# Patient Record
Sex: Female | Born: 1940
Health system: Southern US, Community
[De-identification: ages and names within clinical notes are randomized; demographics above are authoritative.]

## PROBLEM LIST (undated history)

## (undated) ENCOUNTER — Emergency Department (HOSPITAL_COMMUNITY): Payer: Medicare HMO

## (undated) DIAGNOSIS — F419 Anxiety disorder, unspecified: Secondary | ICD-10-CM

## (undated) DIAGNOSIS — R0602 Shortness of breath: Secondary | ICD-10-CM

## (undated) DIAGNOSIS — IMO0001 Reserved for inherently not codable concepts without codable children: Secondary | ICD-10-CM

## (undated) DIAGNOSIS — M1990 Unspecified osteoarthritis, unspecified site: Secondary | ICD-10-CM

## (undated) DIAGNOSIS — K219 Gastro-esophageal reflux disease without esophagitis: Secondary | ICD-10-CM

## (undated) DIAGNOSIS — I10 Essential (primary) hypertension: Secondary | ICD-10-CM

## (undated) DIAGNOSIS — R682 Dry mouth, unspecified: Secondary | ICD-10-CM

## (undated) DIAGNOSIS — Z5189 Encounter for other specified aftercare: Secondary | ICD-10-CM

## (undated) DIAGNOSIS — D649 Anemia, unspecified: Secondary | ICD-10-CM

## (undated) DIAGNOSIS — C50919 Malignant neoplasm of unspecified site of unspecified female breast: Secondary | ICD-10-CM

## (undated) DIAGNOSIS — G709 Myoneural disorder, unspecified: Secondary | ICD-10-CM

## (undated) DIAGNOSIS — C801 Malignant (primary) neoplasm, unspecified: Secondary | ICD-10-CM

## (undated) HISTORY — DX: Essential (primary) hypertension: I10

## (undated) HISTORY — PX: BREAST SURGERY: SHX581

## (undated) HISTORY — DX: Anemia, unspecified: D64.9

## (undated) HISTORY — PX: TUBAL LIGATION: SHX77

## (undated) HISTORY — PX: MASTECTOMY: SHX3

## (undated) HISTORY — DX: Malignant neoplasm of unspecified site of unspecified female breast: C50.919

## (undated) HISTORY — PX: ABDOMINAL HYSTERECTOMY: SHX81

## (undated) HISTORY — PX: BREAST LUMPECTOMY: SHX2

## (undated) HISTORY — PX: OTHER SURGICAL HISTORY: SHX169

## (undated) HISTORY — PX: MASTECTOMY W/ SENTINEL NODE BIOPSY: SHX2001

---

## 2010-05-17 ENCOUNTER — Encounter
Admission: RE | Admit: 2010-05-17 | Discharge: 2010-05-17 | Payer: Self-pay | Source: Home / Self Care | Attending: General Practice | Admitting: General Practice

## 2010-05-18 ENCOUNTER — Encounter
Admission: RE | Admit: 2010-05-18 | Discharge: 2010-05-18 | Payer: Self-pay | Source: Home / Self Care | Attending: General Practice | Admitting: General Practice

## 2010-05-18 ENCOUNTER — Other Ambulatory Visit: Payer: Self-pay | Admitting: Diagnostic Radiology

## 2010-05-19 ENCOUNTER — Ambulatory Visit: Payer: Self-pay | Admitting: Oncology

## 2010-05-22 HISTORY — PX: US FINE NEEDLE ASPIRATION WO/ W SMEAR: HXRAD667

## 2010-05-24 ENCOUNTER — Encounter
Admission: RE | Admit: 2010-05-24 | Discharge: 2010-05-24 | Payer: Self-pay | Source: Home / Self Care | Attending: General Practice | Admitting: General Practice

## 2010-05-24 ENCOUNTER — Ambulatory Visit: Payer: Self-pay | Admitting: Genetic Counselor

## 2010-05-24 LAB — CBC WITH DIFFERENTIAL/PLATELET
BASO%: 0.4 % (ref 0.0–2.0)
Basophils Absolute: 0 10*3/uL (ref 0.0–0.1)
EOS%: 2.3 % (ref 0.0–7.0)
Eosinophils Absolute: 0.1 10*3/uL (ref 0.0–0.5)
HCT: 39.9 % (ref 34.8–46.6)
HGB: 13 g/dL (ref 11.6–15.9)
LYMPH%: 24.7 % (ref 14.0–49.7)
MCH: 26.8 pg (ref 25.1–34.0)
MCHC: 32.5 g/dL (ref 31.5–36.0)
MCV: 82.4 fL (ref 79.5–101.0)
MONO#: 0.5 10*3/uL (ref 0.1–0.9)
MONO%: 8.2 % (ref 0.0–14.0)
NEUT#: 3.8 10*3/uL (ref 1.5–6.5)
NEUT%: 64.4 % (ref 38.4–76.8)
Platelets: 261 10*3/uL (ref 145–400)
RBC: 4.84 10*6/uL (ref 3.70–5.45)
RDW: 14.2 % (ref 11.2–14.5)
WBC: 5.8 10*3/uL (ref 3.9–10.3)
lymph#: 1.4 10*3/uL (ref 0.9–3.3)

## 2010-05-24 LAB — COMPREHENSIVE METABOLIC PANEL
ALT: 17 U/L (ref 0–35)
AST: 16 U/L (ref 0–37)
Albumin: 4.6 g/dL (ref 3.5–5.2)
Alkaline Phosphatase: 70 U/L (ref 39–117)
BUN: 19 mg/dL (ref 6–23)
CO2: 27 mEq/L (ref 19–32)
Calcium: 10.1 mg/dL (ref 8.4–10.5)
Chloride: 102 mEq/L (ref 96–112)
Creatinine, Ser: 1.15 mg/dL (ref 0.40–1.20)
Glucose, Bld: 107 mg/dL — ABNORMAL HIGH (ref 70–99)
Potassium: 4.2 mEq/L (ref 3.5–5.3)
Sodium: 139 mEq/L (ref 135–145)
Total Bilirubin: 0.3 mg/dL (ref 0.3–1.2)
Total Protein: 7.7 g/dL (ref 6.0–8.3)

## 2010-05-24 LAB — CANCER ANTIGEN 27.29: CA 27.29: 50 U/mL — ABNORMAL HIGH (ref 0–39)

## 2010-05-26 ENCOUNTER — Ambulatory Visit (HOSPITAL_COMMUNITY)
Admission: RE | Admit: 2010-05-26 | Discharge: 2010-05-26 | Payer: Self-pay | Source: Home / Self Care | Attending: Oncology | Admitting: Oncology

## 2010-06-01 ENCOUNTER — Ambulatory Visit (HOSPITAL_COMMUNITY)
Admission: RE | Admit: 2010-06-01 | Discharge: 2010-06-01 | Payer: Self-pay | Source: Home / Self Care | Attending: Oncology | Admitting: Oncology

## 2010-06-02 ENCOUNTER — Encounter
Admission: RE | Admit: 2010-06-02 | Discharge: 2010-06-02 | Payer: Self-pay | Source: Home / Self Care | Attending: Oncology | Admitting: Oncology

## 2010-06-30 ENCOUNTER — Encounter (HOSPITAL_BASED_OUTPATIENT_CLINIC_OR_DEPARTMENT_OTHER): Payer: Medicare Other | Admitting: Oncology

## 2010-06-30 ENCOUNTER — Other Ambulatory Visit: Payer: Self-pay | Admitting: Oncology

## 2010-06-30 DIAGNOSIS — C50919 Malignant neoplasm of unspecified site of unspecified female breast: Secondary | ICD-10-CM

## 2010-06-30 LAB — CBC WITH DIFFERENTIAL/PLATELET
Basophils Absolute: 0 10*3/uL (ref 0.0–0.1)
EOS%: 3.3 % (ref 0.0–7.0)
Eosinophils Absolute: 0.2 10*3/uL (ref 0.0–0.5)
HCT: 39.7 % (ref 34.8–46.6)
HGB: 12.6 g/dL (ref 11.6–15.9)
MCH: 25.9 pg (ref 25.1–34.0)
MCV: 81.7 fL (ref 79.5–101.0)
MONO%: 9.2 % (ref 0.0–14.0)
NEUT%: 46.5 % (ref 38.4–76.8)
lymph#: 2.8 10*3/uL (ref 0.9–3.3)

## 2010-06-30 LAB — COMPREHENSIVE METABOLIC PANEL
Albumin: 4.3 g/dL (ref 3.5–5.2)
Alkaline Phosphatase: 71 U/L (ref 39–117)
BUN: 21 mg/dL (ref 6–23)
CO2: 29 mEq/L (ref 19–32)
Glucose, Bld: 115 mg/dL — ABNORMAL HIGH (ref 70–99)
Potassium: 4.2 mEq/L (ref 3.5–5.3)

## 2010-06-30 LAB — VITAMIN D 25 HYDROXY (VIT D DEFICIENCY, FRACTURES): Vit D, 25-Hydroxy: 29 ng/mL — ABNORMAL LOW (ref 30–89)

## 2010-07-07 ENCOUNTER — Encounter (HOSPITAL_BASED_OUTPATIENT_CLINIC_OR_DEPARTMENT_OTHER): Payer: Medicare Other | Admitting: Oncology

## 2010-07-07 DIAGNOSIS — C50919 Malignant neoplasm of unspecified site of unspecified female breast: Secondary | ICD-10-CM

## 2010-07-07 DIAGNOSIS — Z170 Estrogen receptor positive status [ER+]: Secondary | ICD-10-CM

## 2010-08-11 ENCOUNTER — Other Ambulatory Visit: Payer: Self-pay | Admitting: Oncology

## 2010-08-11 ENCOUNTER — Encounter (HOSPITAL_BASED_OUTPATIENT_CLINIC_OR_DEPARTMENT_OTHER): Payer: Medicare Other | Admitting: Oncology

## 2010-08-11 DIAGNOSIS — C50919 Malignant neoplasm of unspecified site of unspecified female breast: Secondary | ICD-10-CM

## 2010-08-11 LAB — CBC WITH DIFFERENTIAL/PLATELET
BASO%: 0.2 % (ref 0.0–2.0)
Basophils Absolute: 0 10e3/uL (ref 0.0–0.1)
EOS%: 2.3 % (ref 0.0–7.0)
Eosinophils Absolute: 0.1 10e3/uL (ref 0.0–0.5)
HCT: 37.1 % (ref 34.8–46.6)
HGB: 11.9 g/dL (ref 11.6–15.9)
LYMPH%: 31.9 % (ref 14.0–49.7)
MCH: 26.3 pg (ref 25.1–34.0)
MCHC: 32.1 g/dL (ref 31.5–36.0)
MCV: 81.9 fL (ref 79.5–101.0)
MONO#: 0.7 10e3/uL (ref 0.1–0.9)
MONO%: 12.8 % (ref 0.0–14.0)
NEUT#: 3 10e3/uL (ref 1.5–6.5)
NEUT%: 52.8 % (ref 38.4–76.8)
Platelets: 230 10e3/uL (ref 145–400)
RBC: 4.53 10e6/uL (ref 3.70–5.45)
RDW: 13.9 % (ref 11.2–14.5)
WBC: 5.6 10e3/uL (ref 3.9–10.3)
lymph#: 1.8 10e3/uL (ref 0.9–3.3)
nRBC: 0 % (ref 0–0)

## 2010-08-11 LAB — COMPREHENSIVE METABOLIC PANEL
ALT: 15 U/L (ref 0–35)
AST: 16 U/L (ref 0–37)
Albumin: 4.2 g/dL (ref 3.5–5.2)
CO2: 27 mEq/L (ref 19–32)
Calcium: 9.7 mg/dL (ref 8.4–10.5)
Chloride: 106 mEq/L (ref 96–112)
Potassium: 3.9 mEq/L (ref 3.5–5.3)
Sodium: 142 mEq/L (ref 135–145)
Total Protein: 7.1 g/dL (ref 6.0–8.3)

## 2010-08-11 LAB — VITAMIN D 25 HYDROXY (VIT D DEFICIENCY, FRACTURES): Vit D, 25-Hydroxy: 37 ng/mL (ref 30–89)

## 2010-08-18 ENCOUNTER — Encounter (HOSPITAL_BASED_OUTPATIENT_CLINIC_OR_DEPARTMENT_OTHER): Payer: Medicare Other | Admitting: Oncology

## 2010-08-18 DIAGNOSIS — C50919 Malignant neoplasm of unspecified site of unspecified female breast: Secondary | ICD-10-CM

## 2010-08-18 DIAGNOSIS — Z170 Estrogen receptor positive status [ER+]: Secondary | ICD-10-CM

## 2010-09-12 ENCOUNTER — Encounter (INDEPENDENT_AMBULATORY_CARE_PROVIDER_SITE_OTHER): Payer: Self-pay | Admitting: General Surgery

## 2010-09-15 ENCOUNTER — Other Ambulatory Visit: Payer: Self-pay | Admitting: Physician Assistant

## 2010-09-15 ENCOUNTER — Encounter (HOSPITAL_BASED_OUTPATIENT_CLINIC_OR_DEPARTMENT_OTHER): Payer: Medicare Other | Admitting: Oncology

## 2010-09-15 DIAGNOSIS — C50919 Malignant neoplasm of unspecified site of unspecified female breast: Secondary | ICD-10-CM

## 2010-09-15 DIAGNOSIS — Z170 Estrogen receptor positive status [ER+]: Secondary | ICD-10-CM

## 2010-09-15 LAB — COMPREHENSIVE METABOLIC PANEL
ALT: 18 U/L (ref 0–35)
AST: 16 U/L (ref 0–37)
Alkaline Phosphatase: 67 U/L (ref 39–117)
BUN: 20 mg/dL (ref 6–23)
Calcium: 9.4 mg/dL (ref 8.4–10.5)
Chloride: 104 mEq/L (ref 96–112)
Creatinine, Ser: 1.06 mg/dL (ref 0.40–1.20)
Potassium: 4.4 mEq/L (ref 3.5–5.3)

## 2010-09-15 LAB — CBC WITH DIFFERENTIAL/PLATELET
BASO%: 0.3 % (ref 0.0–2.0)
Basophils Absolute: 0 10*3/uL (ref 0.0–0.1)
EOS%: 3.1 % (ref 0.0–7.0)
MCH: 25.8 pg (ref 25.1–34.0)
MCHC: 31.6 g/dL (ref 31.5–36.0)
MCV: 81.8 fL (ref 79.5–101.0)
MONO%: 9.7 % (ref 0.0–14.0)
NEUT%: 51.3 % (ref 38.4–76.8)
RDW: 13.9 % (ref 11.2–14.5)
lymph#: 2.2 10*3/uL (ref 0.9–3.3)

## 2010-10-20 ENCOUNTER — Other Ambulatory Visit: Payer: Self-pay | Admitting: Physician Assistant

## 2010-10-20 ENCOUNTER — Other Ambulatory Visit: Payer: Self-pay | Admitting: Oncology

## 2010-10-20 ENCOUNTER — Encounter (HOSPITAL_BASED_OUTPATIENT_CLINIC_OR_DEPARTMENT_OTHER): Payer: Medicare Other | Admitting: Oncology

## 2010-10-20 DIAGNOSIS — C50919 Malignant neoplasm of unspecified site of unspecified female breast: Secondary | ICD-10-CM

## 2010-10-20 DIAGNOSIS — Z853 Personal history of malignant neoplasm of breast: Secondary | ICD-10-CM

## 2010-10-20 DIAGNOSIS — Z170 Estrogen receptor positive status [ER+]: Secondary | ICD-10-CM

## 2010-10-20 DIAGNOSIS — C50419 Malignant neoplasm of upper-outer quadrant of unspecified female breast: Secondary | ICD-10-CM

## 2010-10-20 LAB — CBC WITH DIFFERENTIAL/PLATELET
Basophils Absolute: 0 10*3/uL (ref 0.0–0.1)
Eosinophils Absolute: 0.2 10*3/uL (ref 0.0–0.5)
HCT: 35.9 % (ref 34.8–46.6)
LYMPH%: 27.2 % (ref 14.0–49.7)
MCV: 81.8 fL (ref 79.5–101.0)
MONO#: 0.7 10*3/uL (ref 0.1–0.9)
MONO%: 13.3 % (ref 0.0–14.0)
NEUT#: 3.1 10*3/uL (ref 1.5–6.5)
NEUT%: 56.2 % (ref 38.4–76.8)
Platelets: 219 10*3/uL (ref 145–400)
WBC: 5.6 10*3/uL (ref 3.9–10.3)

## 2010-10-20 LAB — COMPREHENSIVE METABOLIC PANEL
Alkaline Phosphatase: 69 U/L (ref 39–117)
BUN: 19 mg/dL (ref 6–23)
CO2: 27 mEq/L (ref 19–32)
Creatinine, Ser: 1.13 mg/dL — ABNORMAL HIGH (ref 0.50–1.10)
Glucose, Bld: 106 mg/dL — ABNORMAL HIGH (ref 70–99)
Total Bilirubin: 0.3 mg/dL (ref 0.3–1.2)
Total Protein: 7.3 g/dL (ref 6.0–8.3)

## 2010-10-20 LAB — CANCER ANTIGEN 27.29: CA 27.29: 24 U/mL (ref 0–39)

## 2010-10-20 LAB — LACTATE DEHYDROGENASE: LDH: 219 U/L (ref 94–250)

## 2010-10-27 ENCOUNTER — Ambulatory Visit
Admission: RE | Admit: 2010-10-27 | Discharge: 2010-10-27 | Disposition: A | Discharge status: Home / Self Care | Payer: Medicare Other | Source: Ambulatory Visit | Attending: Oncology | Admitting: Oncology

## 2010-10-27 DIAGNOSIS — Z853 Personal history of malignant neoplasm of breast: Secondary | ICD-10-CM

## 2010-11-24 ENCOUNTER — Other Ambulatory Visit: Payer: Self-pay | Admitting: Physician Assistant

## 2010-11-24 ENCOUNTER — Encounter (HOSPITAL_BASED_OUTPATIENT_CLINIC_OR_DEPARTMENT_OTHER): Payer: Medicare Other | Admitting: Oncology

## 2010-11-24 DIAGNOSIS — Z170 Estrogen receptor positive status [ER+]: Secondary | ICD-10-CM

## 2010-11-24 DIAGNOSIS — C50919 Malignant neoplasm of unspecified site of unspecified female breast: Secondary | ICD-10-CM

## 2010-11-24 LAB — COMPREHENSIVE METABOLIC PANEL
AST: 22 U/L (ref 0–37)
Albumin: 4.1 g/dL (ref 3.5–5.2)
BUN: 17 mg/dL (ref 6–23)
Calcium: 9.5 mg/dL (ref 8.4–10.5)
Chloride: 105 mEq/L (ref 96–112)
Glucose, Bld: 78 mg/dL (ref 70–99)
Potassium: 3.8 mEq/L (ref 3.5–5.3)
Sodium: 139 mEq/L (ref 135–145)
Total Protein: 7.2 g/dL (ref 6.0–8.3)

## 2010-11-24 LAB — CBC WITH DIFFERENTIAL/PLATELET
BASO%: 0.3 % (ref 0.0–2.0)
Basophils Absolute: 0 10*3/uL (ref 0.0–0.1)
EOS%: 2.3 % (ref 0.0–7.0)
HGB: 11.9 g/dL (ref 11.6–15.9)
MCH: 26.2 pg (ref 25.1–34.0)
MONO#: 0.6 10*3/uL (ref 0.1–0.9)
NEUT#: 3.5 10*3/uL (ref 1.5–6.5)
RDW: 13.8 % (ref 11.2–14.5)
WBC: 6.5 10*3/uL (ref 3.9–10.3)
lymph#: 2.3 10*3/uL (ref 0.9–3.3)

## 2010-11-24 LAB — CANCER ANTIGEN 27.29: CA 27.29: 25 U/mL (ref 0–39)

## 2010-12-22 ENCOUNTER — Encounter (HOSPITAL_BASED_OUTPATIENT_CLINIC_OR_DEPARTMENT_OTHER): Payer: Medicare Other | Admitting: Oncology

## 2010-12-22 ENCOUNTER — Other Ambulatory Visit: Payer: Self-pay | Admitting: Physician Assistant

## 2010-12-22 DIAGNOSIS — C50419 Malignant neoplasm of upper-outer quadrant of unspecified female breast: Secondary | ICD-10-CM

## 2010-12-22 DIAGNOSIS — Z170 Estrogen receptor positive status [ER+]: Secondary | ICD-10-CM

## 2010-12-22 DIAGNOSIS — C50919 Malignant neoplasm of unspecified site of unspecified female breast: Secondary | ICD-10-CM

## 2010-12-22 LAB — CBC WITH DIFFERENTIAL/PLATELET
BASO%: 0.5 % (ref 0.0–2.0)
EOS%: 2.9 % (ref 0.0–7.0)
LYMPH%: 34.3 % (ref 14.0–49.7)
MCH: 25.9 pg (ref 25.1–34.0)
MCHC: 31.9 g/dL (ref 31.5–36.0)
MONO#: 0.8 10*3/uL (ref 0.1–0.9)
NEUT%: 49.9 % (ref 38.4–76.8)
Platelets: 236 10*3/uL (ref 145–400)
RBC: 4.64 10*6/uL (ref 3.70–5.45)
WBC: 6.2 10*3/uL (ref 3.9–10.3)
nRBC: 0 % (ref 0–0)

## 2010-12-22 LAB — COMPREHENSIVE METABOLIC PANEL
ALT: 19 U/L (ref 0–35)
AST: 16 U/L (ref 0–37)
Alkaline Phosphatase: 71 U/L (ref 39–117)
BUN: 16 mg/dL (ref 6–23)
Calcium: 9.3 mg/dL (ref 8.4–10.5)
Chloride: 103 mEq/L (ref 96–112)
Creatinine, Ser: 1.07 mg/dL (ref 0.50–1.10)
Total Bilirubin: 0.3 mg/dL (ref 0.3–1.2)

## 2011-01-26 ENCOUNTER — Other Ambulatory Visit: Payer: Self-pay | Admitting: Physician Assistant

## 2011-01-26 ENCOUNTER — Encounter (HOSPITAL_BASED_OUTPATIENT_CLINIC_OR_DEPARTMENT_OTHER): Payer: Medicare Other | Admitting: Oncology

## 2011-01-26 DIAGNOSIS — Z170 Estrogen receptor positive status [ER+]: Secondary | ICD-10-CM

## 2011-01-26 DIAGNOSIS — C50419 Malignant neoplasm of upper-outer quadrant of unspecified female breast: Secondary | ICD-10-CM

## 2011-01-26 DIAGNOSIS — C50919 Malignant neoplasm of unspecified site of unspecified female breast: Secondary | ICD-10-CM

## 2011-01-26 DIAGNOSIS — R300 Dysuria: Secondary | ICD-10-CM

## 2011-01-26 LAB — CBC WITH DIFFERENTIAL/PLATELET
BASO%: 0.7 % (ref 0.0–2.0)
Basophils Absolute: 0 10*3/uL (ref 0.0–0.1)
EOS%: 3.2 % (ref 0.0–7.0)
HCT: 37.1 % (ref 34.8–46.6)
HGB: 12.3 g/dL (ref 11.6–15.9)
LYMPH%: 26.6 % (ref 14.0–49.7)
MCH: 26.8 pg (ref 25.1–34.0)
MCHC: 33.1 g/dL (ref 31.5–36.0)
MCV: 80.7 fL (ref 79.5–101.0)
MONO%: 10.5 % (ref 0.0–14.0)
NEUT%: 59 % (ref 38.4–76.8)
Platelets: 223 10*3/uL (ref 145–400)

## 2011-01-26 LAB — URINALYSIS, MICROSCOPIC - CHCC
Ketones: NEGATIVE mg/dL
Nitrite: NEGATIVE
Protein: NEGATIVE mg/dL
pH: 6 (ref 4.6–8.0)

## 2011-01-26 LAB — COMPREHENSIVE METABOLIC PANEL
Albumin: 4.2 g/dL (ref 3.5–5.2)
Alkaline Phosphatase: 76 U/L (ref 39–117)
BUN: 20 mg/dL (ref 6–23)
CO2: 27 mEq/L (ref 19–32)
Glucose, Bld: 91 mg/dL (ref 70–99)
Sodium: 140 mEq/L (ref 135–145)
Total Bilirubin: 0.3 mg/dL (ref 0.3–1.2)
Total Protein: 7.3 g/dL (ref 6.0–8.3)

## 2011-02-20 ENCOUNTER — Other Ambulatory Visit: Payer: Self-pay | Admitting: Oncology

## 2011-02-20 DIAGNOSIS — C50911 Malignant neoplasm of unspecified site of right female breast: Secondary | ICD-10-CM

## 2011-02-23 ENCOUNTER — Other Ambulatory Visit: Payer: Self-pay | Admitting: Physician Assistant

## 2011-02-23 ENCOUNTER — Encounter (HOSPITAL_BASED_OUTPATIENT_CLINIC_OR_DEPARTMENT_OTHER): Payer: Medicare Other | Admitting: Oncology

## 2011-02-23 ENCOUNTER — Encounter: Payer: Medicare Other | Admitting: Hematology and Oncology

## 2011-02-23 DIAGNOSIS — Z170 Estrogen receptor positive status [ER+]: Secondary | ICD-10-CM

## 2011-02-23 DIAGNOSIS — R300 Dysuria: Secondary | ICD-10-CM

## 2011-02-23 DIAGNOSIS — C50919 Malignant neoplasm of unspecified site of unspecified female breast: Secondary | ICD-10-CM

## 2011-02-23 DIAGNOSIS — C50419 Malignant neoplasm of upper-outer quadrant of unspecified female breast: Secondary | ICD-10-CM

## 2011-02-23 LAB — CBC WITH DIFFERENTIAL/PLATELET
Basophils Absolute: 0 10*3/uL (ref 0.0–0.1)
Eosinophils Absolute: 0.2 10*3/uL (ref 0.0–0.5)
HGB: 11.9 g/dL (ref 11.6–15.9)
LYMPH%: 25.6 % (ref 14.0–49.7)
MCV: 80.9 fL (ref 79.5–101.0)
MONO%: 12.6 % (ref 0.0–14.0)
NEUT#: 3.9 10*3/uL (ref 1.5–6.5)
Platelets: 234 10*3/uL (ref 145–400)
RDW: 14.7 % — ABNORMAL HIGH (ref 11.2–14.5)

## 2011-02-23 LAB — COMPREHENSIVE METABOLIC PANEL
ALT: 19 U/L (ref 0–35)
Albumin: 4.2 g/dL (ref 3.5–5.2)
CO2: 23 mEq/L (ref 19–32)
Calcium: 9.7 mg/dL (ref 8.4–10.5)
Chloride: 106 mEq/L (ref 96–112)
Creatinine, Ser: 1.05 mg/dL (ref 0.50–1.10)
Potassium: 4.1 mEq/L (ref 3.5–5.3)
Total Protein: 7.4 g/dL (ref 6.0–8.3)

## 2011-02-23 LAB — URINALYSIS, MICROSCOPIC - CHCC
Bilirubin (Urine): NEGATIVE
Blood: NEGATIVE
pH: 5 (ref 4.6–8.0)

## 2011-02-25 LAB — URINE CULTURE

## 2011-03-09 ENCOUNTER — Ambulatory Visit
Admission: RE | Admit: 2011-03-09 | Discharge: 2011-03-09 | Disposition: A | Discharge status: Home / Self Care | Payer: Medicare Other | Source: Ambulatory Visit | Attending: Oncology | Admitting: Oncology

## 2011-03-09 DIAGNOSIS — C50911 Malignant neoplasm of unspecified site of right female breast: Secondary | ICD-10-CM

## 2011-03-09 MED ORDER — GADOBENATE DIMEGLUMINE 529 MG/ML IV SOLN
20.0000 mL | Freq: Once | INTRAVENOUS | Status: AC | PRN
  Administered 2011-03-09: 20 mL via INTRAVENOUS

## 2011-03-23 ENCOUNTER — Ambulatory Visit (HOSPITAL_BASED_OUTPATIENT_CLINIC_OR_DEPARTMENT_OTHER): Payer: Medicare Other | Admitting: Oncology

## 2011-03-23 ENCOUNTER — Other Ambulatory Visit: Payer: Self-pay

## 2011-03-23 ENCOUNTER — Other Ambulatory Visit (HOSPITAL_BASED_OUTPATIENT_CLINIC_OR_DEPARTMENT_OTHER): Payer: Medicare Other | Admitting: Lab

## 2011-03-23 VITALS — BP 147/83 | HR 96 | Temp 97.8°F | Ht 66.5 in | Wt 258.8 lb

## 2011-03-23 DIAGNOSIS — C50919 Malignant neoplasm of unspecified site of unspecified female breast: Secondary | ICD-10-CM

## 2011-03-23 DIAGNOSIS — Z170 Estrogen receptor positive status [ER+]: Secondary | ICD-10-CM

## 2011-03-23 LAB — CBC WITH DIFFERENTIAL/PLATELET
EOS%: 2.9 % (ref 0.0–7.0)
Eosinophils Absolute: 0.2 10*3/uL (ref 0.0–0.5)
LYMPH%: 32.6 % (ref 14.0–49.7)
MCH: 26.5 pg (ref 25.1–34.0)
MCV: 81.4 fL (ref 79.5–101.0)
MONO%: 10.1 % (ref 0.0–14.0)
NEUT#: 3.2 10*3/uL (ref 1.5–6.5)
Platelets: 248 10*3/uL (ref 145–400)
RBC: 4.74 10*6/uL (ref 3.70–5.45)

## 2011-03-23 LAB — COMPREHENSIVE METABOLIC PANEL
Alkaline Phosphatase: 88 U/L (ref 39–117)
BUN: 17 mg/dL (ref 6–23)
Glucose, Bld: 102 mg/dL — ABNORMAL HIGH (ref 70–99)
Sodium: 140 mEq/L (ref 135–145)
Total Bilirubin: 0.3 mg/dL (ref 0.3–1.2)
Total Protein: 7.4 g/dL (ref 6.0–8.3)

## 2011-03-23 NOTE — Progress Notes (Signed)
Hematology and Oncology Follow Up Visit  Diane Paul [id number] [birth year] 70 y.o. 03/23/2011 10:45 AM   Principle Diagnosis: Locally advanced er/pr+ breast cancer on neoadjuavant femara therapy since 1/12.  Interim History:  Doing well without complain  Medications: I have reviewed the patient's current medications.  Allergies: No Known Allergies  Past Medical History, Surgical history, Social history, and Family History were reviewed and updated.  Review of Systems: Constitutional:  Negative for fever, chills, night sweats, anorexia, weight loss, pain. Cardiovascular: no chest pain or dyspnea on exertion Respiratory: no cough, shortness of breath, or wheezing Neurological: no TIA or stroke symptoms Dermatological: negative ENT: negative Skin Gastrointestinal: no abdominal pain, change in bowel habits, or black or bloody stools Genito-Urinary: no dysuria, trouble voiding, or hematuria Hematological and Lymphatic: negative Breast: negative for breast lumps Musculoskeletal: negative Remaining ROS negative.  Physical Exam: Blood pressure 147/83, pulse 96, temperature 97.8 F (36.6 C), temperature source Oral, height 5' 6.5" (1.689 m), weight 258 lb 12.8 oz (117.391 kg). ECOG:  General appearance: alert, cooperative and appears stated age Head: Normocephalic, without obvious abnormality, atraumatic Neck: no adenopathy, no carotid bruit, no JVD, supple, symmetrical, trachea midline and thyroid not enlarged, symmetric, no tenderness/mass/nodules Lymph nodes: Cervical, supraclavicular, and axillary nodes normal. Cardiac : Normal Pulmonary: Normal  Breasts: Right - no palpable masses , left breast-normal Abdomen: Protuberant, no masses Extremities normal left leg edema-chronic Neuro: Normal  Lab Results: Lab Results  Component Value Date   WBC 5.9 03/23/2011   HGB 12.5 03/23/2011   HCT 38.5 03/23/2011   MCV 81.4 03/23/2011   PLT 248 03/23/2011     Chemistry        Component Value Date/Time   NA 141 02/23/2011 0932   K 4.1 02/23/2011 0932   CL 106 02/23/2011 0932   CO2 23 02/23/2011 0932   BUN 20 02/23/2011 0932   CREATININE 1.05 02/23/2011 0932      Component Value Date/Time   CALCIUM 9.7 02/23/2011 0932   ALKPHOS 80 02/23/2011 0932   AST 17 02/23/2011 0932   ALT 19 02/23/2011 0932   BILITOT 0.2* 02/23/2011 0932       Radiological Studies: chest X-ray n/a Mammogram n/a Bone density n/a  Impression and Plan: 70 year old woman history of ER positive lobular cancer status post neoadjuvant Femara therapy since January 2012, recent MRI scan November 2012 mass in breast measuring 4.2 x 1.7 x 2 cm, compared to 5.4 x 4.6 x 4.4 cm in January 2012 she sees Dr. Derrell Lolling in December and so I would recommend she undergo surgery sometime in January. I will plan to see her again in followup in 3 months. I would also recommend she have radiation oncology followup.   More than 50% of the visit was spent in patient-related counselling   [name], MD 11/16/201210:45 AM

## 2011-04-19 ENCOUNTER — Encounter (INDEPENDENT_AMBULATORY_CARE_PROVIDER_SITE_OTHER): Payer: Self-pay | Admitting: General Surgery

## 2011-04-19 ENCOUNTER — Ambulatory Visit (INDEPENDENT_AMBULATORY_CARE_PROVIDER_SITE_OTHER): Payer: Medicare Other | Admitting: General Surgery

## 2011-04-19 VITALS — BP 140/84 | HR 80 | Temp 98.6°F | Resp 12 | Ht 68.0 in | Wt 258.8 lb

## 2011-04-19 DIAGNOSIS — C50911 Malignant neoplasm of unspecified site of right female breast: Secondary | ICD-10-CM

## 2011-04-19 DIAGNOSIS — C50919 Malignant neoplasm of unspecified site of unspecified female breast: Secondary | ICD-10-CM

## 2011-04-19 NOTE — Patient Instructions (Signed)
You have had a good response to the Femara.  It is now time to plan your surgery to treat the cancer in your right breast. I have discussed lumpectomy and sentinel lobe biopsy. I have discussed mastectomy and sentinel node biopsy. You are going to go home and think about the 2 operations and call me back no later than next Tuesday to tell me which her decision is. We are also going to give you an appointment to see me in one month.

## 2011-04-19 NOTE — Progress Notes (Signed)
Patient ID: Diane Paul, female   DOB: 25-Mar-1941, 70 y.o.   MRN: [id number]  Chief Complaint  Patient presents with  . Follow-up    BREAST CK    HPI Diane Paul is a 70 y.o. female.  She returns to see me after 10 months on neoadjuvant Femara.  This 70 year old African American woman was diagnosed with invasive lobular carcinoma of the right breast on May 24, 2010. This was a 4 cm tumor ER+, Her-2 neg.. It was palpable. MRI suggested that it was a 5.4 x 4.4 cm tumor and so was a T3 by MRI.  Dr. Donnie Coffin put her on neoadjuvant Femara. She has slowly downstaged.  I last saw her in June of 2012. She was lost to followup by me at that time.  Followup MRI was performed on March 09, 2011. This shows decreased enhancement and decrease the mass effect of the tumor, but the  dimensions are still 4.2 x 2.0 x 1.7 cm.  Slightly enhancing right axillary lymph nodes were noted. Left breast and left axilla normal.  She saw Dr. Donnie Coffin in March 23, 2011. He referred her to me for definitive surgery.  She is here with her daughter today. She says the breast feels better than it did a year ago. We spent a long time talking about lumpectomy and sentinel node biopsy versus mastectomy. She is somewhat ambivalent. She is more concerned about recurrence in the breast that she is about cosmesis. Her sister had breast cancer and had a difficult time with that. She is unable to make a decision about extent of  surgery today. we did discuss that for about 30 minutes. HPI  Past Medical History  Diagnosis Date  . Hypertension   . Anemia     Past Surgical History  Procedure Date  . Abdominal hysterectomy   . Tubal ligation     No family history on file.  Social History History  Substance Use Topics  . Smoking status: Never Smoker   . Smokeless tobacco: Not on file  . Alcohol Use: No    No Known Allergies  Current Outpatient Prescriptions  Medication Sig Dispense Refill  . amLODipine  (NORVASC) 5 MG tablet Take 5 mg by mouth daily.        . Calcium Carbonate-Vit D-Min (CALTRATE PLUS PO) Take by mouth. 1200MG  OF CALCIUM & 800IU OF VITAMIN D       . letrozole (FEMARA) 2.5 MG tablet Take 2.5 mg by mouth daily.        Marland Kitchen VITAMIN D, CHOLECALCIFEROL, PO Take by mouth.          Review of Systems Review of Systems  Constitutional: Negative for fever, chills and unexpected weight change.  HENT: Negative for hearing loss, congestion, sore throat, trouble swallowing and voice change.   Eyes: Negative for visual disturbance.  Respiratory: Negative for cough and wheezing.   Cardiovascular: Negative for chest pain, palpitations and leg swelling.  Gastrointestinal: Negative for nausea, vomiting, abdominal pain, diarrhea, constipation, blood in stool, abdominal distention and anal bleeding.  Genitourinary: Negative for hematuria, vaginal bleeding and difficulty urinating.  Musculoskeletal: Negative for arthralgias.  Skin: Negative for rash and wound.  Neurological: Negative for seizures, syncope and headaches.  Hematological: Negative for adenopathy. Does not bruise/bleed easily.  Psychiatric/Behavioral: Negative for confusion.    Blood pressure 140/84, pulse 80, temperature 98.6 F (37 C), resp. rate 12, height 5\' 8"  (1.727 m), weight 258 lb 12.8 oz (117.391 kg).  Physical Exam Physical Exam  Constitutional: She is oriented to person, place, and time. She appears well-developed and well-nourished. No distress.  HENT:  Head: Normocephalic and atraumatic.  Nose: Nose normal.  Mouth/Throat: No oropharyngeal exudate.  Eyes: Conjunctivae and EOM are normal. Pupils are equal, round, and reactive to light. Left eye exhibits no discharge. No scleral icterus.  Neck: Neck supple. No JVD present. No tracheal deviation present. No thyromegaly present.  Cardiovascular: Normal rate, regular rhythm, normal heart sounds and intact distal pulses.   No murmur heard. Pulmonary/Chest: Effort  normal and breath sounds normal. No respiratory distress. She has no wheezes. She has no rales. She exhibits no tenderness.    Abdominal: Soft. Bowel sounds are normal. She exhibits no distension and no mass. There is no tenderness. There is no rebound and no guarding.  Musculoskeletal: She exhibits no edema and no tenderness.  Lymphadenopathy:    She has no cervical adenopathy.  Neurological: She is alert and oriented to person, place, and time. She exhibits normal muscle tone. Coordination normal.  Skin: Skin is warm. No rash noted. She is not diaphoretic. No erythema. No pallor.  Psychiatric: She has a normal mood and affect. Her behavior is normal. Judgment and thought content normal.    Data Reviewed  I have reviewed her recent MRI, Dr. Renelda Loma notes. Assessment    Invasive lobular carcinoma right breast, 9:00 position, pretreatment stage by MRI was T3 N0 lesion. Receptor positive, HER-2-negative.  This has responded reasonably well to neoadjuvant Femara.  Much better response by physical exam than by MRI. It is not clear to me what the extent of disease is in her right breast since the physical exam and MRI do not correlate.  Obesity  Hypertension  Family history of breast cancer in her sister.    Plan    Lengthy discussion about lumpectomy, mastectomy, sentinel node biopsy. We talked about the pros and cons of the different surgeries. She was offered referral to a plastic surgeon if she chooses mastectomy. She is aware that sometimes second surgery is required following lumpectomy for positive margins, extend of  disease, multiple lymph nodes with cancer. She is aware that there is a possibility of local recurrence even with mastectomy.  After about 30 minute discussion she was still somewhat uncertain and wanted to go home and think about this. She will call me back in 2-4 days with her decision.  We will plan to proceed with definitive surgery in January, regardless, as  there does not appear to be any advantage for further neoadjuvant treatment.       Dasani Thurlow M 04/19/2011, 12:17 PM

## 2011-04-24 ENCOUNTER — Telehealth (INDEPENDENT_AMBULATORY_CARE_PROVIDER_SITE_OTHER): Payer: Self-pay

## 2011-04-24 NOTE — Telephone Encounter (Signed)
Pts son called to discuss benefit of mastectomy vs lumpectomy.I also advised him that the percentages for her cure would not be available until the pathology was complete and Dr Donnie Coffin evaluated this information.  I also a He will review this with his mother and have her call back with her decision.

## 2011-05-24 ENCOUNTER — Encounter (INDEPENDENT_AMBULATORY_CARE_PROVIDER_SITE_OTHER): Payer: Self-pay | Admitting: General Surgery

## 2011-05-24 ENCOUNTER — Ambulatory Visit (INDEPENDENT_AMBULATORY_CARE_PROVIDER_SITE_OTHER): Payer: Medicare Other | Admitting: General Surgery

## 2011-05-24 VITALS — BP 160/94 | HR 114 | Temp 98.3°F | Ht 68.0 in | Wt 262.2 lb

## 2011-05-24 DIAGNOSIS — C50911 Malignant neoplasm of unspecified site of right female breast: Secondary | ICD-10-CM

## 2011-05-24 DIAGNOSIS — C50919 Malignant neoplasm of unspecified site of unspecified female breast: Secondary | ICD-10-CM

## 2011-05-24 NOTE — Progress Notes (Signed)
Patient ID: Diane Paul, female   DOB: 1941/04/28, 71 y.o.   MRN: [id number]  Chief Complaint  Patient presents with  . Follow-up    reck br    HPI Diane Paul is a 71 y.o. female.  She is here to discuss definitive surgical planning for her right breast cancer.  She was initially diagnosed with invasive lobular carcinoma of the right breast at the 9:00 position in January of 2012, one year ago. The tumor was palpable and was 4 cm in size, receptor positive and HER-2/neu negative. She has been on neoadjuvant Femara. By Physical exam the tumor is much smaller and much softer. By MRI it is about the same size.  She has a family history of breast cancer in her sister.  I saw her one month ago and we talk about definitive surgery. She was undecided that time as to what she wants a day. She is now back with her daughter and her son and she has decided she was a right mastectomy and is not interested in any reconstruction. She is aware that she may or may not need chest wall radiation.HPI  Past Medical History  Diagnosis Date  . Hypertension   . Anemia     Past Surgical History  Procedure Date  . Abdominal hysterectomy   . Tubal ligation   . Breast surgery     History reviewed. No pertinent family history.  Social History History  Substance Use Topics  . Smoking status: Never Smoker   . Smokeless tobacco: Not on file  . Alcohol Use: No    No Known Allergies  Current Outpatient Prescriptions  Medication Sig Dispense Refill  . amLODipine (NORVASC) 5 MG tablet Take 5 mg by mouth daily.        . Calcium Carbonate-Vit D-Min (CALTRATE PLUS PO) Take by mouth. 1200MG  OF CALCIUM & 800IU OF VITAMIN D       . letrozole (FEMARA) 2.5 MG tablet Take 2.5 mg by mouth daily.        Marland Kitchen VITAMIN D, CHOLECALCIFEROL, PO Take by mouth.          Review of Systems Review of Systems  Constitutional: Negative for fever, chills and unexpected weight change.  HENT: Negative for hearing loss,  congestion, sore throat, trouble swallowing and voice change.   Eyes: Negative for visual disturbance.  Respiratory: Negative for cough and wheezing.   Cardiovascular: Negative for chest pain, palpitations and leg swelling.  Gastrointestinal: Negative for nausea, vomiting, abdominal pain, diarrhea, constipation, blood in stool, abdominal distention and anal bleeding.  Genitourinary: Negative for hematuria, vaginal bleeding and difficulty urinating.  Musculoskeletal: Negative for arthralgias.  Skin: Negative for rash and wound.  Neurological: Negative for seizures, syncope and headaches.  Hematological: Negative for adenopathy. Does not bruise/bleed easily.  Psychiatric/Behavioral: Negative for confusion. The patient is nervous/anxious.     Blood pressure 160/94, pulse 114, temperature 98.3 F (36.8 C), temperature source Temporal, height 5\' 8"  (1.727 m), weight 262 lb 3.2 oz (118.933 kg), SpO2 96.00%.  Physical Exam Physical Exam  Constitutional: She is oriented to person, place, and time. She appears well-developed and well-nourished. No distress.  HENT:  Head: Normocephalic and atraumatic.  Nose: Nose normal.  Mouth/Throat: No oropharyngeal exudate.  Eyes: Conjunctivae and EOM are normal. Pupils are equal, round, and reactive to light. Left eye exhibits no discharge. No scleral icterus.  Neck: Neck supple. No JVD present. No tracheal deviation present. No thyromegaly present.  Cardiovascular: Normal rate, regular  rhythm, normal heart sounds and intact distal pulses.   No murmur heard. Pulmonary/Chest: Effort normal and breath sounds normal. No respiratory distress. She has no wheezes. She has no rales. She exhibits no tenderness.       Right breast soft with no significant mass. Possibly a small area of thickening at 9 oclock. no adenopathy bilaterally.  Abdominal: Soft. Bowel sounds are normal. She exhibits no distension and no mass. There is no tenderness. There is no rebound and no  guarding.       Lower midline scar.  Musculoskeletal: She exhibits edema. She exhibits no tenderness.       Trace ankle edema  Lymphadenopathy:    She has no cervical adenopathy.  Neurological: She is alert and oriented to person, place, and time. She exhibits normal muscle tone. Coordination normal.  Skin: Skin is warm. No rash noted. She is not diaphoretic. No erythema. No pallor.  Psychiatric: She has a normal mood and affect. Her behavior is normal. Judgment and thought content normal.    Data Reviewed I have reviewed all of my old notes, all of the old pathology reports, the recent MRI, and Dr. Gita Kudo recent notes.  Assessment    Invasive lobular carcinoma right breast, 9:00 position. Pretreatment staged by MRI was T3, N0. By physical exam this responded nicely to neoadjuvant Femara . By MRI there is less of a response.  A benign cyst left breast, aspirated  Obesity  Hypertension  Family history of breast cancer in sister.    Plan    Will schedule for right total mastectomy and right axillary sentinel node biopsy.  The patient has been offered plastic surgical consultation but has declined this.  Patient is aware that she may or may not need radiation therapy to the chest wall.  I have discussed the indications and details of surgery with her. Risks and complications have been outlined, including but not limited to bleeding, infection, further surgery for positive margins, skin necrosis, arm swelling, arm pain, arm numbness, cardiac pulmonary and thromboembolic problems. She seems to understand these issues well. At this time her questions were answered. She agrees with the plan.   Angelia Mould. Derrell Lolling, M.D., [facility] General and Minimally invasive Surgery Breast and Colorectal Surgery Office:   [phone] Pager:   [phone]     05/24/2011, 12:39 PM

## 2011-05-24 NOTE — Patient Instructions (Signed)
You will be scheduled for a right total mastectomy and right axillary sentinel lymph node biopsy.  I advise you to see your primary care physician as soon as possible to get your medications for hypertension refilled and to consider a flu shot.

## 2011-05-28 ENCOUNTER — Encounter (HOSPITAL_COMMUNITY): Payer: Self-pay | Admitting: Pharmacy Technician

## 2011-05-28 DIAGNOSIS — N390 Urinary tract infection, site not specified: Secondary | ICD-10-CM | POA: Diagnosis not present

## 2011-05-28 DIAGNOSIS — E785 Hyperlipidemia, unspecified: Secondary | ICD-10-CM | POA: Diagnosis not present

## 2011-05-28 DIAGNOSIS — I10 Essential (primary) hypertension: Secondary | ICD-10-CM | POA: Diagnosis not present

## 2011-05-28 DIAGNOSIS — R3915 Urgency of urination: Secondary | ICD-10-CM | POA: Diagnosis not present

## 2011-05-28 DIAGNOSIS — E559 Vitamin D deficiency, unspecified: Secondary | ICD-10-CM | POA: Diagnosis not present

## 2011-05-29 DIAGNOSIS — Z23 Encounter for immunization: Secondary | ICD-10-CM | POA: Diagnosis not present

## 2011-06-01 ENCOUNTER — Encounter (HOSPITAL_COMMUNITY)
Admission: RE | Admit: 2011-06-01 | Discharge: 2011-06-01 | Disposition: A | Discharge status: Home / Self Care | Payer: Medicare Other | Source: Ambulatory Visit | Attending: General Surgery | Admitting: General Surgery

## 2011-06-01 ENCOUNTER — Encounter (HOSPITAL_COMMUNITY): Payer: Self-pay

## 2011-06-01 ENCOUNTER — Other Ambulatory Visit: Payer: Self-pay

## 2011-06-01 ENCOUNTER — Other Ambulatory Visit (INDEPENDENT_AMBULATORY_CARE_PROVIDER_SITE_OTHER): Payer: Self-pay | Admitting: General Surgery

## 2011-06-01 DIAGNOSIS — Z853 Personal history of malignant neoplasm of breast: Secondary | ICD-10-CM | POA: Diagnosis not present

## 2011-06-01 DIAGNOSIS — Z79899 Other long term (current) drug therapy: Secondary | ICD-10-CM | POA: Diagnosis not present

## 2011-06-01 DIAGNOSIS — Z6841 Body Mass Index (BMI) 40.0 and over, adult: Secondary | ICD-10-CM | POA: Diagnosis not present

## 2011-06-01 DIAGNOSIS — Z01811 Encounter for preprocedural respiratory examination: Secondary | ICD-10-CM | POA: Diagnosis not present

## 2011-06-01 DIAGNOSIS — Z01810 Encounter for preprocedural cardiovascular examination: Secondary | ICD-10-CM | POA: Diagnosis not present

## 2011-06-01 DIAGNOSIS — I517 Cardiomegaly: Secondary | ICD-10-CM | POA: Diagnosis not present

## 2011-06-01 DIAGNOSIS — C50911 Malignant neoplasm of unspecified site of right female breast: Secondary | ICD-10-CM

## 2011-06-01 DIAGNOSIS — C50919 Malignant neoplasm of unspecified site of unspecified female breast: Secondary | ICD-10-CM | POA: Diagnosis not present

## 2011-06-01 HISTORY — DX: Reserved for inherently not codable concepts without codable children: IMO0001

## 2011-06-01 HISTORY — DX: Unspecified osteoarthritis, unspecified site: M19.90

## 2011-06-01 HISTORY — DX: Anxiety disorder, unspecified: F41.9

## 2011-06-01 HISTORY — DX: Malignant (primary) neoplasm, unspecified: C80.1

## 2011-06-01 HISTORY — DX: Encounter for other specified aftercare: Z51.89

## 2011-06-01 HISTORY — DX: Shortness of breath: R06.02

## 2011-06-01 HISTORY — DX: Myoneural disorder, unspecified: G70.9

## 2011-06-01 HISTORY — DX: Dry mouth, unspecified: R68.2

## 2011-06-01 LAB — URINALYSIS, ROUTINE W REFLEX MICROSCOPIC
Glucose, UA: NEGATIVE mg/dL
Ketones, ur: NEGATIVE mg/dL
Protein, ur: NEGATIVE mg/dL

## 2011-06-01 LAB — COMPREHENSIVE METABOLIC PANEL
ALT: 19 U/L (ref 0–35)
Alkaline Phosphatase: 97 U/L (ref 39–117)
CO2: 28 mEq/L (ref 19–32)
Chloride: 103 mEq/L (ref 96–112)
GFR calc Af Amer: 64 mL/min — ABNORMAL LOW (ref >90)
GFR calc non Af Amer: 55 mL/min — ABNORMAL LOW (ref >90)
Glucose, Bld: 107 mg/dL — ABNORMAL HIGH (ref 70–99)
Potassium: 4 mEq/L (ref 3.5–5.1)
Sodium: 140 mEq/L (ref 135–145)

## 2011-06-01 LAB — DIFFERENTIAL
Basophils Absolute: 0 10*3/uL (ref 0.0–0.1)
Eosinophils Absolute: 0.2 10*3/uL (ref 0.0–0.7)
Eosinophils Relative: 3 % (ref 0–5)
Lymphs Abs: 2.3 10*3/uL (ref 0.7–4.0)

## 2011-06-01 LAB — SURGICAL PCR SCREEN
MRSA, PCR: NEGATIVE
Staphylococcus aureus: NEGATIVE

## 2011-06-01 LAB — CBC
Hemoglobin: 12.6 g/dL (ref 12.0–15.0)
RBC: 4.82 MIL/uL (ref 3.87–5.11)

## 2011-06-01 NOTE — Progress Notes (Signed)
Pt. Reports that she hasn't ever had an ekg in past.

## 2011-06-01 NOTE — Progress Notes (Signed)
Call to [facility] at [facility], to inform the office Nuc. Med. will need an order for injection for DOS.  She agrees to relay this to Dr. Derrell Lolling

## 2011-06-01 NOTE — Pre-Procedure Instructions (Signed)
20 Diane Paul  06/01/2011   Your procedure is scheduled on:  06/11/2011  Report to [facility] at 9:00 AM.  Call this number if you have problems the morning of surgery: [phone]   Remember:   Do not eat food:After Midnight.  May have clear liquids: up to 4 Hours before arrival.  Clear liquids include soda, tea, black coffee, apple or grape juice, broth.  Take these medicines the morning of surgery with A SIP OF WATER: NOTHING   Do not wear jewelry, make-up or nail polish.  Do not wear lotions, powders, or perfumes. You may wear deodorant.  Do not shave 48 hours prior to surgery.  Do not bring valuables to the hospital.  Contacts, dentures or bridgework may not be worn into surgery.  Leave suitcase in the car. After surgery it may be brought to your room.  For patients admitted to the hospital, checkout time is 11:00 AM the day of discharge.   Patients discharged the day of surgery will not be allowed to drive home.  Name and phone number of your driver:    With daughter   Special Instructions: CHG Shower Use Special Wash: 1/2 bottle night before surgery and 1/2 bottle morning of surgery.   Please read over the following fact sheets that you were given: Pain Booklet, Coughing and Deep Breathing, MRSA Information and Surgical Site Infection Prevention

## 2011-06-06 ENCOUNTER — Other Ambulatory Visit: Payer: Self-pay | Admitting: Oncology

## 2011-06-08 NOTE — H&P (Signed)
[facility]    MRN: [id number]   Description: 71 year old female  Provider: Ernestene Mention, MD  Department: [facility]      Diagnoses     Breast cancer, right breast   - Primary    174.9      History and Physical     Ernestene Mention, MD  Patient ID: TIONDRA FANG, female   DOB: 1940/11/07, 71 y.o.   MRN: [id number]    Chief Complaint   Patient presents with   .  Follow-up       reck br      HPI MCKENLEY BIRENBAUM is a 71 y.o. female.  She is here to discuss definitive surgical planning for her right breast cancer.  She was initially diagnosed with invasive lobular carcinoma of the right breast at the 9:00 position in January of 2012, one year ago. The tumor was palpable and was 4 cm in size, receptor positive and HER-2/neu negative. She has been on neoadjuvant Femara. By Physical exam the tumor is much smaller and much softer. By MRI it is about the same size.  She has a family history of breast cancer in her sister.  I saw her one month ago and we talk about definitive surgery. She was undecided that time as to what she wants a day. She is now back with her daughter and her son and she has decided she was a right mastectomy and is not interested in any reconstruction. She is aware that she may or may not need chest wall radiation.HPI    Past Medical History   Diagnosis  Date   .  Hypertension     .  Anemia         Past Surgical History   Procedure  Date   .  Abdominal hysterectomy     .  Tubal ligation     .  Breast surgery        History reviewed. No pertinent family history.   Social History History   Substance Use Topics   .  Smoking status:  Never Smoker    .  Smokeless tobacco:  Not on file   .  Alcohol Use:  No      No Known Allergies    Current Outpatient Prescriptions   Medication  Sig  Dispense  Refill   .  amLODipine (NORVASC) 5 MG tablet  Take 5 mg by mouth daily.           .  Calcium Carbonate-Vit D-Min (CALTRATE PLUS PO)   Take by mouth. 1200MG  OF CALCIUM & 800IU OF VITAMIN D          .  letrozole (FEMARA) 2.5 MG tablet  Take 2.5 mg by mouth daily.           Marland Kitchen  VITAMIN D, CHOLECALCIFEROL, PO  Take by mouth.              Review of Systems   Constitutional: Negative for fever, chills and unexpected weight change.  HENT: Negative for hearing loss, congestion, sore throat, trouble swallowing and voice change.   Eyes: Negative for visual disturbance.  Respiratory: Negative for cough and wheezing.   Cardiovascular: Negative for chest pain, palpitations and leg swelling.  Gastrointestinal: Negative for nausea, vomiting, abdominal pain, diarrhea, constipation, blood in stool, abdominal distention and anal bleeding.  Genitourinary: Negative for hematuria, vaginal bleeding and difficulty urinating.  Musculoskeletal: Negative for arthralgias.  Skin: Negative for rash  and wound.  Neurological: Negative for seizures, syncope and headaches.  Hematological: Negative for adenopathy. Does not bruise/bleed easily.  Psychiatric/Behavioral: Negative for confusion. The patient is nervous/anxious.     Blood pressure 160/94, pulse 114, temperature 98.3 F (36.8 C), temperature source Temporal, height 5\' 8"  (1.727 m), weight 262 lb 3.2 oz (118.933 kg), SpO2 96.00%.   Physical Exam  Constitutional: She is oriented to person, place, and time. She appears well-developed and well-nourished. No distress.  HENT:   Head: Normocephalic and atraumatic.   Nose: Nose normal.   Mouth/Throat: No oropharyngeal exudate.  Eyes: Conjunctivae and EOM are normal. Pupils are equal, round, and reactive to light. Left eye exhibits no discharge. No scleral icterus.  Neck: Neck supple. No JVD present. No tracheal deviation present. No thyromegaly present.  Cardiovascular: Normal rate, regular rhythm, normal heart sounds and intact distal pulses.    No murmur heard. Pulmonary/Chest: Effort normal and breath sounds normal. No respiratory distress.  She has no wheezes. She has no rales. She exhibits no tenderness.       Right breast soft with no significant mass. Possibly a small area of thickening at 9 oclock. no adenopathy bilaterally.  Abdominal: Soft. Bowel sounds are normal. She exhibits no distension and no mass. There is no tenderness. There is no rebound and no guarding.       Lower midline scar.  Musculoskeletal: She exhibits edema. She exhibits no tenderness.       Trace ankle edema  Lymphadenopathy:    She has no cervical adenopathy.  Neurological: She is alert and oriented to person, place, and time. She exhibits normal muscle tone. Coordination normal.  Skin: Skin is warm. No rash noted. She is not diaphoretic. No erythema. No pallor.  Psychiatric: She has a normal mood and affect. Her behavior is normal. Judgment and thought content normal.    Data Reviewed I have reviewed all of my old notes, all of the old pathology reports, the recent MRI, and Dr. Gita Kudo recent notes.   Assessment Invasive lobular carcinoma right breast, 9:00 position. Pretreatment staged by MRI was T3, N0. By physical exam this responded nicely to neoadjuvant Femara . By MRI there is less of a response.  A benign cyst left breast, aspirated  Obesity  Hypertension  Family history of breast cancer in sister.   Plan Will schedule for right total mastectomy and right axillary sentinel node biopsy.  The patient has been offered plastic surgical consultation but has declined this.  Patient is aware that she may or may not need radiation therapy to the chest wall.  I have discussed the indications and details of surgery with her. Risks and complications have been outlined, including but not limited to bleeding, infection, further surgery for positive margins, skin necrosis, arm swelling, arm pain, arm numbness, cardiac pulmonary and thromboembolic problems. She seems to understand these issues well. At this time her questions were answered. She  agrees with the plan.     Angelia Mould. Derrell Lolling, M.D., [facility] General and Minimally invasive Surgery Breast and Colorectal Surgery Office:   [phone] Pager:   [phone]

## 2011-06-10 MED ORDER — CHLORHEXIDINE GLUCONATE 4 % EX LIQD: 1.0000 "application " | Freq: Once | CUTANEOUS | Status: DC

## 2011-06-10 MED ORDER — HEPARIN SODIUM (PORCINE) 5000 UNIT/ML IJ SOLN: 5000.0000 [IU] | Freq: Once | INTRAMUSCULAR | Status: DC

## 2011-06-10 MED ORDER — CEFAZOLIN SODIUM-DEXTROSE 2-3 GM-% IV SOLR
2.0000 g | INTRAVENOUS | Status: DC
  Filled 2011-06-10: qty 50

## 2011-06-11 ENCOUNTER — Other Ambulatory Visit (INDEPENDENT_AMBULATORY_CARE_PROVIDER_SITE_OTHER): Payer: Self-pay | Admitting: General Surgery

## 2011-06-11 ENCOUNTER — Ambulatory Visit (HOSPITAL_COMMUNITY)
Admission: RE | Admit: 2011-06-11 | Discharge: 2011-06-11 | Disposition: A | Discharge status: Home / Self Care | Payer: Medicare Other | Source: Ambulatory Visit | Attending: General Surgery | Admitting: General Surgery

## 2011-06-11 ENCOUNTER — Encounter (HOSPITAL_COMMUNITY): Payer: Self-pay | Admitting: *Deleted

## 2011-06-11 ENCOUNTER — Encounter (HOSPITAL_COMMUNITY)
Admission: RE | Disposition: A | Discharge status: Home / Self Care | Payer: Self-pay | Source: Ambulatory Visit | Attending: General Surgery

## 2011-06-11 ENCOUNTER — Inpatient Hospital Stay (HOSPITAL_COMMUNITY)
Admission: RE | Admit: 2011-06-11 | Discharge: 2011-06-13 | DRG: 580 | Disposition: A | Discharge status: Home / Self Care | Payer: Medicare Other | Source: Ambulatory Visit | Attending: General Surgery | Admitting: General Surgery

## 2011-06-11 ENCOUNTER — Encounter (HOSPITAL_COMMUNITY): Payer: Self-pay | Admitting: Certified Registered"

## 2011-06-11 ENCOUNTER — Ambulatory Visit (HOSPITAL_COMMUNITY): Payer: Medicare Other | Admitting: Certified Registered"

## 2011-06-11 DIAGNOSIS — Z01810 Encounter for preprocedural cardiovascular examination: Secondary | ICD-10-CM

## 2011-06-11 DIAGNOSIS — I10 Essential (primary) hypertension: Secondary | ICD-10-CM | POA: Diagnosis present

## 2011-06-11 DIAGNOSIS — C50411 Malignant neoplasm of upper-outer quadrant of right female breast: Secondary | ICD-10-CM | POA: Diagnosis present

## 2011-06-11 DIAGNOSIS — Z6841 Body Mass Index (BMI) 40.0 and over, adult: Secondary | ICD-10-CM

## 2011-06-11 DIAGNOSIS — C50919 Malignant neoplasm of unspecified site of unspecified female breast: Secondary | ICD-10-CM | POA: Diagnosis not present

## 2011-06-11 DIAGNOSIS — Z79899 Other long term (current) drug therapy: Secondary | ICD-10-CM | POA: Diagnosis not present

## 2011-06-11 DIAGNOSIS — Z01812 Encounter for preprocedural laboratory examination: Secondary | ICD-10-CM | POA: Diagnosis not present

## 2011-06-11 DIAGNOSIS — C50911 Malignant neoplasm of unspecified site of right female breast: Secondary | ICD-10-CM

## 2011-06-11 DIAGNOSIS — E669 Obesity, unspecified: Secondary | ICD-10-CM | POA: Diagnosis present

## 2011-06-11 DIAGNOSIS — R079 Chest pain, unspecified: Secondary | ICD-10-CM | POA: Diagnosis not present

## 2011-06-11 DIAGNOSIS — Z853 Personal history of malignant neoplasm of breast: Secondary | ICD-10-CM | POA: Diagnosis present

## 2011-06-11 SURGERY — MASTECTOMY WITH SENTINEL LYMPH NODE BIOPSY
Anesthesia: General | Site: Breast | Laterality: Right | Wound class: Clean

## 2011-06-11 MED ORDER — TECHNETIUM TC 99M SULFUR COLLOID FILTERED
1.0000 | Freq: Once | INTRAVENOUS | Status: AC | PRN
  Administered 2011-06-11: 1 via INTRADERMAL

## 2011-06-11 MED ORDER — LACTATED RINGERS IV SOLN
INTRAVENOUS | Status: DC | PRN
  Administered 2011-06-11 (×2): via INTRAVENOUS

## 2011-06-11 MED ORDER — MORPHINE SULFATE 4 MG/ML IJ SOLN: 0.0500 mg/kg | INTRAMUSCULAR | Status: DC | PRN

## 2011-06-11 MED ORDER — LETROZOLE 2.5 MG PO TABS
2.5000 mg | ORAL_TABLET | Freq: Every day | ORAL | Status: DC
  Administered 2011-06-11 – 2011-06-12 (×3): 2.5 mg via ORAL
  Filled 2011-06-11 (×3): qty 1

## 2011-06-11 MED ORDER — MIDAZOLAM HCL 2 MG/ML PO SYRP: 0.5000 mg/kg | ORAL_SOLUTION | Freq: Once | ORAL | Status: DC

## 2011-06-11 MED ORDER — WHITE PETROLATUM GEL
Status: AC
  Administered 2011-06-11: 21:00:00
  Filled 2011-06-11: qty 5

## 2011-06-11 MED ORDER — 0.9 % SODIUM CHLORIDE (POUR BTL) OPTIME
TOPICAL | Status: DC | PRN
  Administered 2011-06-11 (×2): 1000 mL

## 2011-06-11 MED ORDER — HYDROMORPHONE HCL PF 1 MG/ML IJ SOLN
0.2500 mg | INTRAMUSCULAR | Status: DC | PRN
  Administered 2011-06-11 (×2): 0.5 mg via INTRAVENOUS

## 2011-06-11 MED ORDER — ONDANSETRON HCL 4 MG PO TABS: 4.0000 mg | ORAL_TABLET | Freq: Four times a day (QID) | ORAL | Status: DC | PRN

## 2011-06-11 MED ORDER — MIDAZOLAM HCL 2 MG/2ML IJ SOLN
0.5000 mg | INTRAMUSCULAR | Status: DC | PRN
Start: 2011-06-11 — End: 2011-06-11
  Administered 2011-06-11: 1 mg via INTRAVENOUS

## 2011-06-11 MED ORDER — MEPERIDINE HCL 25 MG/ML IJ SOLN: 6.2500 mg | INTRAMUSCULAR | Status: DC | PRN

## 2011-06-11 MED ORDER — MORPHINE SULFATE 2 MG/ML IJ SOLN
2.0000 mg | INTRAMUSCULAR | Status: DC | PRN
  Administered 2011-06-11 – 2011-06-12 (×2): 2 mg via INTRAVENOUS
  Filled 2011-06-11 (×2): qty 1

## 2011-06-11 MED ORDER — MIDAZOLAM HCL 2 MG/2ML IJ SOLN
INTRAMUSCULAR | Status: AC
  Filled 2011-06-11: qty 2

## 2011-06-11 MED ORDER — MORPHINE SULFATE 2 MG/ML IJ SOLN: 0.0500 mg/kg | INTRAMUSCULAR | Status: DC | PRN

## 2011-06-11 MED ORDER — ONDANSETRON HCL 4 MG/2ML IJ SOLN: 4.0000 mg | Freq: Four times a day (QID) | INTRAMUSCULAR | Status: DC | PRN

## 2011-06-11 MED ORDER — PROPOFOL 10 MG/ML IV EMUL
INTRAVENOUS | Status: DC | PRN
  Administered 2011-06-11: 30 mg via INTRAVENOUS
  Administered 2011-06-11: 200 mg via INTRAVENOUS
  Administered 2011-06-11: 100 mg via INTRAVENOUS
  Administered 2011-06-11: 30 mg via INTRAVENOUS

## 2011-06-11 MED ORDER — FENTANYL CITRATE 0.05 MG/ML IJ SOLN
INTRAMUSCULAR | Status: DC | PRN
  Administered 2011-06-11: 25 ug via INTRAVENOUS
  Administered 2011-06-11 (×2): 50 ug via INTRAVENOUS
  Administered 2011-06-11: 25 ug via INTRAVENOUS
  Administered 2011-06-11 (×3): 50 ug via INTRAVENOUS

## 2011-06-11 MED ORDER — HEPARIN SODIUM (PORCINE) 5000 UNIT/ML IJ SOLN
5000.0000 [IU] | Freq: Three times a day (TID) | INTRAMUSCULAR | Status: DC
  Administered 2011-06-12 – 2011-06-13 (×3): 5000 [IU] via SUBCUTANEOUS
  Filled 2011-06-11 (×7): qty 1

## 2011-06-11 MED ORDER — POTASSIUM CHLORIDE IN NACL 20-0.9 MEQ/L-% IV SOLN
INTRAVENOUS | Status: DC
  Administered 2011-06-11 – 2011-06-12 (×3): via INTRAVENOUS
  Filled 2011-06-11 (×5): qty 1000

## 2011-06-11 MED ORDER — MIDAZOLAM HCL 5 MG/5ML IJ SOLN
INTRAMUSCULAR | Status: DC | PRN
  Administered 2011-06-11: 2 mg via INTRAVENOUS

## 2011-06-11 MED ORDER — SODIUM CHLORIDE 0.9 % IJ SOLN
INTRAMUSCULAR | Status: DC | PRN
  Administered 2011-06-11: 12:00:00

## 2011-06-11 MED ORDER — ONDANSETRON HCL 4 MG/2ML IJ SOLN
INTRAMUSCULAR | Status: DC | PRN
  Administered 2011-06-11: 4 mg via INTRAVENOUS

## 2011-06-11 MED ORDER — LACTATED RINGERS IV SOLN
INTRAVENOUS | Status: DC
  Administered 2011-06-11: 10:00:00 via INTRAVENOUS

## 2011-06-11 MED ORDER — FENTANYL CITRATE 0.05 MG/ML IJ SOLN
INTRAMUSCULAR | Status: AC
  Filled 2011-06-11: qty 2

## 2011-06-11 MED ORDER — FENTANYL CITRATE 0.05 MG/ML IJ SOLN
50.0000 ug | INTRAMUSCULAR | Status: DC | PRN
  Administered 2011-06-11: 50 ug via INTRAVENOUS

## 2011-06-11 MED ORDER — AMLODIPINE BESYLATE 5 MG PO TABS
5.0000 mg | ORAL_TABLET | Freq: Every day | ORAL | Status: DC
  Administered 2011-06-11 – 2011-06-12 (×2): 5 mg via ORAL
  Filled 2011-06-11 (×3): qty 1

## 2011-06-11 MED ORDER — HYDROCODONE-ACETAMINOPHEN 5-325 MG PO TABS
1.0000 | ORAL_TABLET | ORAL | Status: DC | PRN
  Administered 2011-06-12 – 2011-06-13 (×3): 1 via ORAL
  Filled 2011-06-11 (×3): qty 1

## 2011-06-11 MED ORDER — ONDANSETRON HCL 4 MG/2ML IJ SOLN: 4.0000 mg | Freq: Once | INTRAMUSCULAR | Status: DC | PRN

## 2011-06-11 MED ORDER — MIDAZOLAM HCL 2 MG/ML PO SYRP: 12.0000 mg | ORAL_SOLUTION | Freq: Once | ORAL | Status: DC

## 2011-06-11 MED ORDER — CEFAZOLIN SODIUM 1-5 GM-% IV SOLN
1.0000 g | Freq: Four times a day (QID) | INTRAVENOUS | Status: AC
  Administered 2011-06-11 – 2011-06-12 (×3): 1 g via INTRAVENOUS
  Filled 2011-06-11 (×3): qty 50

## 2011-06-11 SURGICAL SUPPLY — 61 items
ADH SKN CLS APL DERMABOND .7 (GAUZE/BANDAGES/DRESSINGS)
APL SKNCLS STERI-STRIP NONHPOA (GAUZE/BANDAGES/DRESSINGS) ×1
APPLIER CLIP 9.375 MED OPEN (MISCELLANEOUS)
APR CLP MED 9.3 20 MLT OPN (MISCELLANEOUS)
BENZOIN TINCTURE PRP APPL 2/3 (GAUZE/BANDAGES/DRESSINGS) ×1 IMPLANT
BINDER BREAST LRG (GAUZE/BANDAGES/DRESSINGS) IMPLANT
BINDER BREAST XLRG (GAUZE/BANDAGES/DRESSINGS) ×1 IMPLANT
CANISTER SUCTION 2500CC (MISCELLANEOUS) ×2 IMPLANT
CHLORAPREP W/TINT 26ML (MISCELLANEOUS) ×2 IMPLANT
CLIP APPLIE 9.375 MED OPEN (MISCELLANEOUS) IMPLANT
CLOTH BEACON ORANGE TIMEOUT ST (SAFETY) ×2 IMPLANT
CONT SPEC 4OZ CLIKSEAL STRL BL (MISCELLANEOUS) ×4 IMPLANT
COVER PROBE W GEL 5X96 (DRAPES) ×2 IMPLANT
COVER SURGICAL LIGHT HANDLE (MISCELLANEOUS) ×2 IMPLANT
DERMABOND ADVANCED (GAUZE/BANDAGES/DRESSINGS)
DERMABOND ADVANCED .7 DNX12 (GAUZE/BANDAGES/DRESSINGS) ×1 IMPLANT
DRAIN CHANNEL 19F RND (DRAIN) ×3 IMPLANT
DRAPE LAPAROSCOPIC ABDOMINAL (DRAPES) ×2 IMPLANT
DRAPE PROXIMA HALF (DRAPES) ×1 IMPLANT
DRSG ADAPTIC 3X8 NADH LF (GAUZE/BANDAGES/DRESSINGS) ×3 IMPLANT
DRSG PAD ABDOMINAL 8X10 ST (GAUZE/BANDAGES/DRESSINGS) ×2 IMPLANT
ELECT BLADE 4.0 EZ CLEAN MEGAD (MISCELLANEOUS) ×2
ELECT CAUTERY BLADE 6.4 (BLADE) ×2 IMPLANT
ELECT REM PT RETURN 9FT ADLT (ELECTROSURGICAL) ×2
ELECTRODE BLDE 4.0 EZ CLN MEGD (MISCELLANEOUS) ×1 IMPLANT
ELECTRODE REM PT RTRN 9FT ADLT (ELECTROSURGICAL) ×1 IMPLANT
EVACUATOR SILICONE 100CC (DRAIN) ×3 IMPLANT
GLOVE BIO SURGEON STRL SZ7.5 (GLOVE) ×1 IMPLANT
GLOVE BIOGEL PI IND STRL 7.0 (GLOVE) IMPLANT
GLOVE BIOGEL PI IND STRL 7.5 (GLOVE) IMPLANT
GLOVE BIOGEL PI INDICATOR 7.0 (GLOVE) ×2
GLOVE BIOGEL PI INDICATOR 7.5 (GLOVE) ×1
GLOVE EUDERMIC 7 POWDERFREE (GLOVE) ×2 IMPLANT
GLOVE SURG SS PI 7.5 STRL IVOR (GLOVE) ×2 IMPLANT
GOWN PREVENTION PLUS XLARGE (GOWN DISPOSABLE) ×3 IMPLANT
GOWN STRL NON-REIN LRG LVL3 (GOWN DISPOSABLE) ×4 IMPLANT
KIT BASIN OR (CUSTOM PROCEDURE TRAY) ×2 IMPLANT
KIT ROOM TURNOVER OR (KITS) ×2 IMPLANT
NDL 18GX1X1/2 (RX/OR ONLY) (NEEDLE) ×1 IMPLANT
NDL HYPO 25GX1X1/2 BEV (NEEDLE) ×2 IMPLANT
NEEDLE 18GX1X1/2 (RX/OR ONLY) (NEEDLE) ×2 IMPLANT
NEEDLE HYPO 25GX1X1/2 BEV (NEEDLE) ×4 IMPLANT
NS IRRIG 1000ML POUR BTL (IV SOLUTION) ×2 IMPLANT
PACK GENERAL/GYN (CUSTOM PROCEDURE TRAY) ×2 IMPLANT
PAD ARMBOARD 7.5X6 YLW CONV (MISCELLANEOUS) ×4 IMPLANT
PEN SKIN MARKING BROAD (MISCELLANEOUS) ×2 IMPLANT
SPECIMEN JAR X LARGE (MISCELLANEOUS) ×1 IMPLANT
SPONGE GAUZE 4X4 12PLY (GAUZE/BANDAGES/DRESSINGS) ×1 IMPLANT
SPONGE LAP 18X18 X RAY DECT (DISPOSABLE) ×2 IMPLANT
STAPLER VISISTAT 35W (STAPLE) ×2 IMPLANT
STRIP CLOSURE SKIN 1/2X4 (GAUZE/BANDAGES/DRESSINGS) ×2 IMPLANT
SUT ETHILON 3 0 FSL (SUTURE) ×3 IMPLANT
SUT MNCRL AB 4-0 PS2 18 (SUTURE) ×3 IMPLANT
SUT SILK 2 0 FS (SUTURE) ×2 IMPLANT
SUT VIC AB 3-0 SH 18 (SUTURE) ×5 IMPLANT
SYR CONTROL 10ML LL (SYRINGE) ×4 IMPLANT
TAPE CLOTH SURG 6X10 WHT LF (GAUZE/BANDAGES/DRESSINGS) ×1 IMPLANT
TOWEL OR 17X24 6PK STRL BLUE (TOWEL DISPOSABLE) ×2 IMPLANT
TOWEL OR 17X26 10 PK STRL BLUE (TOWEL DISPOSABLE) ×2 IMPLANT
TOWEL OR NON WOVEN STRL DISP B (DISPOSABLE) ×1 IMPLANT
WATER STERILE IRR 1000ML POUR (IV SOLUTION) IMPLANT

## 2011-06-11 NOTE — Op Note (Signed)
Patient Name:           Diane Paul   Date of Surgery:        06/11/2011  Pre op Diagnosis:      Lobular carcinoma right breast, clinical stage T3, N0, status post neoadjuvant Femara.  Post op Diagnosis:    same  Procedure:                 Inject blue dye right breast, right total mastectomy, right axillary sentinel node biopsy  Surgeon:                     Angelia Mould. Derrell Lolling, M.D., FACS  Assistant:                      Lodema Pilot, D.O.  Operative Indications:   This is a 71 year old African American female who was initially diagnosed with invasive lobular carcinoma of the right breast at the 9:00 position in January of 2012, 1 year ago. The tumor was palpable and was 4 cm in size, was receptor positive and HER-2/neu negative. She was ambivalent about extent of surgery. She was placed on neoadjuvant Femara. Recent physical exam shows the tumor is much softer and much smaller. By MRI it is about the same size. The patient has been counseled numerous times and it was her decision to go ahead with a right mastectomy. She declined plastic surgical consultation and she declined reconstruction.  Operative Findings:       The right breast felt basically normal. I did not feel any mass within the breast and there was no obvious skin change. The right axillary lymph nodes looked normal. I found 3 sentinel nodes.  Procedure in Detail:          Following the induction of general LMA anesthesia intravenous antibiotics were given. Surgical time out was performed. Following alcohol prep I injected 5 cc of blue dye into the right breast, subareolar area. This was a mixture of methylene blue and saline. The breast was massaged for 5 minutes. The breast and axilla and chest wall were then prepped and draped in a sterile fashion. Using a marking pen I drew a transverse elliptical incision. The incision was slightly lower medially and slightly higher laterally. I made the incision with the knife. Skin flaps were  raised superiorly to the infraclavicular area, medially to the parasternal area, inferiorly to the anterior rectus sheath, and laterally to the anterior border of the latissimus dorsi muscle. The breast was dissected off of the pectoralis major and minor muscles with electrocautery, leaving the muscle intact. I incised the clavipectoral fascia overlying level I lymph nodes. Using the neoprobe I identified 3 sentinel lymph nodes. All of these were very strong positive radioactivity but only one had blue dye in it. None of the nodes  felt pathologic. They were sent for routine histology. I continued the dissection dissecting out the tail of Spence. I marked the lateral skin margin of the breast with a silk suture. The breast was then sent as a separate specimen. Hemostasis was excellent and achieve electrocautery. The wound was copiously irrigated with about 2 L of saline. Hemostasis was good. Two 19-French Blake drains were placed, one up across the skin flaps and one up into the axillary area. These were brought out through separate stab incisions anterolaterally below the incision, sutured to the skin with nylon sutures and connected to suction bulbs. The incision was closed in 2 layers.  The subcutaneous tissue and deep dermis were closed with interrupted sutures of 3-0 Vicryl and the skin closed with a running subcuticular suture of 4-0 Monocryl and Steri-Strips. A clean occlusive bandage was placed. A breast binder was placed. The patient was taken recovery room in stable condition. There were no complications. Counts correct. EBL 50 cc.     Angelia Mould. Derrell Lolling, M.D., FACS General and Minimally Invasive Surgery Breast and Colorectal Surgery  06/11/2011 1:20 PM

## 2011-06-11 NOTE — Preoperative (Signed)
Beta Blockers   Reason not to administer Beta Blockers:Not Applicable 

## 2011-06-11 NOTE — Transfer of Care (Signed)
Immediate Anesthesia Transfer of Care Note  Patient: Diane Paul  Procedure(s) Performed:  MASTECTOMY WITH SENTINEL LYMPH NODE BIOPSY - Right total mastectomy and sentinel lymph node biopsy using lymphatic mapping and blue dye injection.  Patient Location: PACU  Anesthesia Type: General  Level of Consciousness: patient cooperative, lethargic and responds to stimulation  Airway & Oxygen Therapy: Patient Spontanous Breathing and Patient connected to nasal cannula oxygen  Post-op Assessment: Report given to PACU RN  Post vital signs: Reviewed and stable  Complications: No apparent anesthesia complications

## 2011-06-11 NOTE — Anesthesia Postprocedure Evaluation (Signed)
Anesthesia Post Note  Patient: Diane Paul  Procedure(s) Performed:  MASTECTOMY WITH SENTINEL LYMPH NODE BIOPSY - Right total mastectomy and sentinel lymph node biopsy using lymphatic mapping and blue dye injection.  Anesthesia type: general  Patient location: PACU  Post pain: Pain level controlled  Post assessment: Patient's Cardiovascular Status Stable  Last Vitals:  Filed Vitals:   06/11/11 1430  BP:   Pulse:   Temp: 36.3 C  Resp:     Post vital signs: Reviewed and stable  Level of consciousness: sedated  Complications: No apparent anesthesia complications

## 2011-06-11 NOTE — Interval H&P Note (Signed)
History and Physical Interval Note:  06/11/2011 11:17 AM  Diane Paul  has presented today for surgery, with the diagnosis of right breast cancer  The goals of treatment and the  various methods of treatment have been discussed with the patient and family. After consideration of risks, benefits and other options for treatment, the patient has consented to  Procedure(s): RIGHT MASTECTOMY WITH SENTINEL LYMPH NODE BIOPSY as a surgical intervention .  The patients' history has been reviewed, patient examined, no change in status, stable for surgery.  I have reviewed the patients' chart and labs.  Questions were answered to the patient's satisfaction.     [name] 06/11/2011 11:18 AM

## 2011-06-11 NOTE — Anesthesia Preprocedure Evaluation (Addendum)
Anesthesia Evaluation  Patient identified by MRN, date of birth, ID band Patient awake    Reviewed: Allergy & Precautions, H&P , NPO status , Patient's Chart, lab work & pertinent test results  Airway Mallampati: I TM Distance: >3 FB Neck ROM: full    Dental  (+) Dental Advisory Given, Edentulous Upper and Edentulous Lower   Pulmonary          Cardiovascular hypertension, On Medications     Neuro/Psych    GI/Hepatic   Endo/Other    Renal/GU      Musculoskeletal   Abdominal   Peds  Hematology   Anesthesia Other Findings   Reproductive/Obstetrics                          Anesthesia Physical Anesthesia Plan  ASA: II  Anesthesia Plan: General ETT   Post-op Pain Management:    Induction:   Airway Management Planned:   Additional Equipment:   Intra-op Plan:   Post-operative Plan:   Informed Consent: I have reviewed the patients History and Physical, chart, labs and discussed the procedure including the risks, benefits and alternatives for the proposed anesthesia with the patient or authorized representative who has indicated his/her understanding and acceptance.     Plan Discussed with: CRNA and Surgeon  Anesthesia Plan Comments:         Anesthesia Quick Evaluation

## 2011-06-12 DIAGNOSIS — Z853 Personal history of malignant neoplasm of breast: Secondary | ICD-10-CM | POA: Diagnosis present

## 2011-06-12 DIAGNOSIS — C50411 Malignant neoplasm of upper-outer quadrant of right female breast: Secondary | ICD-10-CM | POA: Diagnosis present

## 2011-06-12 MED ORDER — MORPHINE SULFATE 2 MG/ML IJ SOLN
1.0000 mg | INTRAMUSCULAR | Status: DC | PRN
Start: 2011-06-12 — End: 2011-06-13

## 2011-06-12 MED ORDER — POLYETHYLENE GLYCOL 3350 17 G PO PACK
17.0000 g | PACK | Freq: Every day | ORAL | Status: DC
  Administered 2011-06-12: 17 g via ORAL
  Filled 2011-06-12 (×2): qty 1

## 2011-06-12 NOTE — Progress Notes (Signed)
1 Day Post-Op  Subjective: Stable and alert. Has been intermittently nauseated. Up to bathroom. Able to void. Using morphine. Not much pain at present.  The patient and her family state that they think she should stay 1 more day in the hospital.  Both drains are functioning. Serosanguineous. 470 cc out over 24 hours.  Objective: Vital signs in last 24 hours: Temp:  [97.4 F (36.3 C)-98.7 F (37.1 C)] 98.7 F (37.1 C) (02/05 0550) Pulse Rate:  [79-105] 92  (02/05 0550) Resp:  [14-20] 18  (02/05 0550) BP: (115-150)/(55-75) 128/59 mmHg (02/05 0550) SpO2:  [92 %-100 %] 94 % (02/05 0550) Weight:  [257 lb 11.5 oz (116.9 kg)] 257 lb 11.5 oz (116.9 kg) (02/05 0238) Last BM Date: 06/11/11  Intake/Output from previous day: 02/04 0701 - 02/05 0700 In: 3500 [P.O.:840; I.V.:2520; IV Piggyback:50] Out: 2220 [Urine:1750; Drains:470] Intake/Output this shift: Total I/O In: 1900 [P.O.:480; I.V.:1320; Other:50; IV Piggyback:50] Out: 1360 [Urine:1150; Drains:210]  General appearance: alert. Does not appear in any distress. Skin warm and dry. Resp: clear to auscultation bilaterally Breasts: , right mastectomy skin flaps looked healthy. Skin is warm and dry. No hematoma or fluid collections. No obvious skin necrosis. Both drains functioning. Extremities: Right arm reveals good range of motion her shoulder. There is no numbness obvious on the right upper arm. There is no swelling of the right upper arm. Lab Results:  No results found for this or any previous visit (from the past 24 hour(s)).   Studies/Results: @RISRSLT24 @     . amLODipine  5 mg Oral Q1400  .  ceFAZolin (ANCEF) IV  1 g Intravenous Q6H  . heparin  5,000 Units Subcutaneous Q8H  . letrozole  2.5 mg Oral Daily  . white petrolatum      . DISCONTD:  ceFAZolin (ANCEF) IV  2 g Intravenous 60 min Pre-Op  . DISCONTD: chlorhexidine  1 application Topical Once  . DISCONTD: heparin  5,000 Units Subcutaneous Once  . DISCONTD: midazolam   0.5 mg/kg Oral Once  . DISCONTD: midazolam  12 mg Oral Once     Assessment/Plan: s/p Procedure(s): MASTECTOMY WITH SENTINEL LYMPH NODE BIOPSY  Stable. Drain output high, but not bloody Nausea We'll try to eat increase diet and activities today. MiraLAX ordered for constipation. Teach drain care. Plan discharge home tomorrow.    LOS: 1 day    Zitlaly Malson M. Derrell Lolling, M.D., [facility] General and Minimally invasive Surgery Breast and Colorectal Surgery Office:   [phone] Pager:   [phone]  06/12/2011  . .prob

## 2011-06-13 ENCOUNTER — Telehealth (INDEPENDENT_AMBULATORY_CARE_PROVIDER_SITE_OTHER): Payer: Self-pay

## 2011-06-13 ENCOUNTER — Encounter (HOSPITAL_COMMUNITY): Payer: Self-pay | Admitting: General Surgery

## 2011-06-13 MED ORDER — HYDROCODONE-ACETAMINOPHEN 5-325 MG PO TABS: 1.0000 | ORAL_TABLET | ORAL | Status: AC | PRN

## 2011-06-13 MED ORDER — DIPHENHYDRAMINE HCL 25 MG PO CAPS: 25.0000 mg | ORAL_CAPSULE | Freq: Four times a day (QID) | ORAL | Status: DC | PRN

## 2011-06-13 NOTE — Progress Notes (Signed)
2 Days Post-Op  Subjective: She is doing well. She is feeling good. She is ready to go home. Ambulatory. Tolerating diet.  Both drains functioning.  Objective: Vital signs in last 24 hours: Temp:  [98.1 F (36.7 C)-98.7 F (37.1 C)] 98.3 F (36.8 C) (02/05 2121) Pulse Rate:  [92-105] 101  (02/05 2121) Resp:  [18-20] 18  (02/05 2121) BP: (124-138)/(59-71) 135/68 mmHg (02/05 2121) SpO2:  [94 %-98 %] 95 % (02/05 2121) Last BM Date: 06/11/11  Intake/Output from previous day: 02/05 0701 - 02/06 0700 In: 960 [P.O.:960] Out: 2255 [Urine:2100; Drains:155] Intake/Output this shift: Total I/O In: 360 [P.O.:360] Out: 845 [Urine:800; Drains:45]  Resp: clear to auscultation bilaterally Breasts: , right mastectomy wound is clean, dry, flap. Skin flaps looked healthy. There is no hematoma. The dressing is dry.  Lab Results:  No results found for this or any previous visit (from the past 24 hour(s)).   Studies/Results: @RISRSLT24 @     . amLODipine  5 mg Oral Q1400  . heparin  5,000 Units Subcutaneous Q8H  . letrozole  2.5 mg Oral Daily  . polyethylene glycol  17 g Oral Daily     Assessment/Plan: s/p Procedure(s): RIGHT MASTECTOMY WITH SENTINEL LYMPH NODE BIOPSY  Patient is doing well and it will be discharged today. Drain care and records keeping instructions have been completed. Prescription for Vicodin will be given See me in office in one week. Check pathology    LOS: 2 days    Yarithza Mink M. Derrell Lolling, M.D., [facility] General and Minimally invasive Surgery Breast and Colorectal Surgery Office:   [phone] Pager:   [phone]  06/13/2011  . .prob

## 2011-06-13 NOTE — Telephone Encounter (Signed)
Left msg that po appt 06/21/2011 arrive at 12:15.

## 2011-06-13 NOTE — Discharge Summary (Addendum)
Patient ID: JENSINE LUZ [id number] 71 y.o. 21-Jul-1940  06/11/2011  Discharge date and time:  Feb. 6, 2013  Admitting Physician: Ernestene Mention  Discharge Physician: Ernestene Mention  Admission Diagnoses: right breast cancer                                          Morbid obesity (BMI 40)  Discharge Diagnoses: invasive lobular carcinoma right breast, clinical stage T3 N0, status post neoadjuvant Femara Morbid obesity (BMI 40)  Operations: Procedure(s): RIGHT MASTECTOMY WITH SENTINEL LYMPH NODE BIOPSY  Admission Condition: good  Discharged Condition: good  Indication for Admission:    This is a 71 year old African American female who was initially diagnosed with invasive lobular carcinoma of the right breast at the 9:00 position in January of 2012, 1 year ago. The tumor was palpable and was 4 cm in size, was receptor positive and HER-2/neu negative. She was ambivalent about extent of surgery. She was placed on neoadjuvant Femara. Recent physical exam shows the tumor is much softer and much smaller. By MRI it is about the same size. The patient has been counseled numerous times and it was her decision to go ahead with a right mastectomy. She declined plastic surgical consultation and she declined reconstruction.   Hospital Course: on the day of admission the patient was taken to the operating room and underwent a right total mastectomy and sentinel node biopsy. The surgery was uneventful. On postop day 1 she was stable but felt a little nauseated and was not very active and we decided to keep her another day. She remained stable. On postop day 2 she felt much better had been ambulating in the halls, tolerating a diet and felt ready to go home. Examination on the day of discharge revealed that the wound was clean and dry without hematoma. The skin flaps looked healthy. Both drains were functioning and draining serosanguineous fluid. The pathology report is pending at this time. She was  given a prescription for Vicodin for pain and asked to return to see me in the office in one week. She was instructed on drain care.  Consults: None  Significant Diagnostic Studies: pathology pending  Treatments: surgery: right total mastectomy and sentinel node biopsy.  Disposition: Home  Patient Instructions:   [place], [place]  Home Medication Instructions [id number]   Printed on:06/13/11 0535  Medication Information                    amLODipine (NORVASC) 5 MG tablet Take 5 mg by mouth daily at 2 PM daily at 2 PM.            Calcium Carbonate-Vit D-Min (CALTRATE PLUS PO) Take by mouth. 1200MG  OF CALCIUM & 800IU OF VITAMIN D            B Complex-C-Folic Acid (MULTIVITAMIN, STRESS FORMULA) tablet Take 1 tablet by mouth daily. gummie bear MVT           letrozole (FEMARA) 2.5 MG tablet TAKE 1 TABLET BY MOUTH EVERY DAY           HYDROcodone-acetaminophen (NORCO) 5-325 MG per tablet Take 1-2 tablets by mouth every 4 (four) hours as needed for pain.             Activity: ambulate. Weight lifting restriction. Do not drivefor 3 weeks. Keep right shoulder moving. Diet: low fat, low cholesterol diet Wound  Care: as directed  Follow-up:  With Dr. Derrell Lolling in 1 week.  Signed: Angelia Mould. Derrell Lolling, M.D., FACS General and minimally invasive surgery Breast and Colorectal Surgery  06/13/2011, 5:35 AM

## 2011-06-13 NOTE — Progress Notes (Signed)
UR of chart complete.  

## 2011-06-15 ENCOUNTER — Telehealth (INDEPENDENT_AMBULATORY_CARE_PROVIDER_SITE_OTHER): Payer: Self-pay

## 2011-06-15 ENCOUNTER — Other Ambulatory Visit (HOSPITAL_COMMUNITY): Payer: Medicare Other

## 2011-06-15 NOTE — Telephone Encounter (Signed)
Pt notified of path result per Dr Jacinto Halim request. Pt states she has noticed a little ankle swelling. No fever,no redness,no pain in legs or calfs. Pt advised she may want to see her PCP today check this. Pt states she will see [place] in [place] today.

## 2011-06-15 NOTE — Progress Notes (Signed)
Quick Note:  Inform patient of Pathology report,. Negative nodes. Negative margins. ______

## 2011-06-15 NOTE — Telephone Encounter (Signed)
Message copied by Joanette Gula on Fri Jun 15, 2011  9:08 AM ------      Message from: Diane Paul      Created: Fri Jun 15, 2011  6:59 AM       Inform patient of Pathology report,.   Negative nodes. Negative margins.

## 2011-06-21 ENCOUNTER — Encounter (INDEPENDENT_AMBULATORY_CARE_PROVIDER_SITE_OTHER): Payer: Self-pay | Admitting: General Surgery

## 2011-06-21 ENCOUNTER — Ambulatory Visit (INDEPENDENT_AMBULATORY_CARE_PROVIDER_SITE_OTHER): Payer: Medicare Other | Admitting: General Surgery

## 2011-06-21 VITALS — BP 160/86 | HR 72 | Temp 97.8°F | Resp 18 | Ht 67.0 in | Wt 259.2 lb

## 2011-06-21 DIAGNOSIS — Z9889 Other specified postprocedural states: Secondary | ICD-10-CM

## 2011-06-21 NOTE — Patient Instructions (Signed)
I will see you back in one week to check the drains.

## 2011-06-21 NOTE — Progress Notes (Signed)
Subjective:     Patient ID: Diane Paul, female   DOB: 09/13/1940, 71 y.o.   MRN: [id number]  HPI This patient underwent neoadjuvant therapy with Femara for one year and then underwent right right total mastectomy and sentinel node biopsy on June 11, 2011. Final pathology report showed invasive lobular carcinoma, 1.3 cm, negative margins. Pathologic stage ypT1c.,  ypN0, receptor positive, HER-2 negative.  She is coming along okay. She still drained 25-35 cc per day out of each of her 2 drains.  She is not having much pain and has no other problem.  Review of Systems     Objective:   Physical Exam Patient looks well. In no distress. Family with her.  Right mastectomy dressing is completely removed. The skin flaps looked very good. No infection. The residual fluid. I left the drainage and redressed the wound.    Assessment:     Invasive lobular carcinoma right breast, pathologic stage ypT1c,  ypN0, receptor positive, HER-2-negative.  Status post neoadjuvant Femara.  Status post right total mastectomy and SLN biopsy on June 11, 2011. Doing well Postop.    Plan:     Return to see me in one week. Hopefully we can remove one or both drains at that time  The patient was given a copy of her pathology report and this was discussed with her.  We will refer her to physical therapy after the drains were removed.  She will follow up with [name] regarding adjuvant therapies.   Angelia Mould. Derrell Lolling, M.D., [facility] General and Minimally invasive Surgery Breast and Colorectal Surgery Office:   [phone] Pager:   [phone]

## 2011-06-22 ENCOUNTER — Telehealth: Payer: Self-pay | Admitting: *Deleted

## 2011-06-22 ENCOUNTER — Other Ambulatory Visit: Payer: Medicare Other | Admitting: Lab

## 2011-06-22 ENCOUNTER — Ambulatory Visit (HOSPITAL_BASED_OUTPATIENT_CLINIC_OR_DEPARTMENT_OTHER): Payer: Medicare Other | Admitting: Oncology

## 2011-06-22 VITALS — BP 129/79 | HR 106 | Temp 98.6°F | Ht 67.0 in | Wt 258.1 lb

## 2011-06-22 DIAGNOSIS — Z79811 Long term (current) use of aromatase inhibitors: Secondary | ICD-10-CM | POA: Diagnosis not present

## 2011-06-22 DIAGNOSIS — Z170 Estrogen receptor positive status [ER+]: Secondary | ICD-10-CM | POA: Diagnosis not present

## 2011-06-22 DIAGNOSIS — C50919 Malignant neoplasm of unspecified site of unspecified female breast: Secondary | ICD-10-CM

## 2011-06-22 DIAGNOSIS — E559 Vitamin D deficiency, unspecified: Secondary | ICD-10-CM

## 2011-06-22 NOTE — Progress Notes (Signed)
Hematology and Oncology Follow Up Visit  Diane Paul [id number] May 14, 1940 71 y.o. 06/22/2011 1:03 PM   Principle Diagnosis: Locally advanced er/pr+ breast cancer on neoadjuavant femara therapy since 1/12.  Interim History:  Doing well without complain, s/p mastectomy on 06/11/11.  Medications: I have reviewed the patient's current medications.  Allergies: No Known Allergies  Past Medical History, Surgical history, Social history, and Family History were reviewed and updated.  Review of Systems: Constitutional:  Negative for fever, chills, night sweats, anorexia, weight loss, pain. Cardiovascular: no chest pain or dyspnea on exertion Respiratory: no cough, shortness of breath, or wheezing Neurological: no TIA or stroke symptoms Dermatological: negative ENT: negative Skin Gastrointestinal: no abdominal pain, change in bowel habits, or black or bloody stools Genito-Urinary: no dysuria, trouble voiding, or hematuria Hematological and Lymphatic: negative Breast: negative for breast lumps Musculoskeletal: negative Remaining ROS negative.  Physical Exam: Blood pressure 129/79, pulse 106, temperature 98.6 F (37 C), height 5\' 7"  (1.702 m), weight 258 lb 1.6 oz (117.073 kg). ECOG: 0 General appearance: alert, cooperative and appears stated age Head: Normocephalic, without obvious abnormality, atraumatic Neck: no adenopathy, no carotid bruit, no JVD, supple, symmetrical, trachea midline and thyroid not enlarged, symmetric, no tenderness/mass/nodules Lymph nodes: Cervical, supraclavicular, and axillary nodes normal. Cardiac : Normal Pulmonary: Normal , s/p mastectomy.drain in place. Breasts: Right - no palpable masses , left breast-normal Abdomen: Protuberant, no masses Extremities normal left leg edema-chronic Neuro: Normal  Lab Results: Lab Results  Component Value Date   WBC 7.8 06/01/2011   HGB 12.6 06/01/2011   HCT 39.4 06/01/2011   MCV 81.7 06/01/2011   PLT 256 06/01/2011      Chemistry      Component Value Date/Time   NA 140 06/01/2011 1305   K 4.0 06/01/2011 1305   CL 103 06/01/2011 1305   CO2 28 06/01/2011 1305   BUN 15 06/01/2011 1305   CREATININE 1.01 06/01/2011 1305      Component Value Date/Time   CALCIUM 10.0 06/01/2011 1305   ALKPHOS 97 06/01/2011 1305   AST 17 06/01/2011 1305   ALT 19 06/01/2011 1305   BILITOT 0.2* 06/01/2011 1305       Radiological Studies: chest X-ray n/a Mammogram n/a Bone density n/a  Impression and Plan: F/u mastectomy , final path residual 1.3 cm er+ tumor, -ve margins; 0/8 nodes .  Excellent response will continue letrozole.. F/u 6 motnhs. More than 50% of the visit was spent in patient-related counselling   [name], MD 2/15/20131:03 PM

## 2011-06-22 NOTE — Telephone Encounter (Signed)
gave patient appointment for 03-2013printed out calendar and gave to the patient 

## 2011-06-27 ENCOUNTER — Ambulatory Visit
Admission: RE | Admit: 2011-06-27 | Discharge: 2011-06-27 | Disposition: A | Discharge status: Home / Self Care | Payer: Medicare Other | Source: Ambulatory Visit | Attending: Radiation Oncology | Admitting: Radiation Oncology

## 2011-06-27 ENCOUNTER — Encounter: Payer: Self-pay | Admitting: Radiation Oncology

## 2011-06-27 ENCOUNTER — Encounter: Payer: Self-pay | Admitting: *Deleted

## 2011-06-27 VITALS — BP 134/75 | HR 107 | Temp 97.7°F | Resp 20 | Ht 67.0 in | Wt 259.8 lb

## 2011-06-27 DIAGNOSIS — C50919 Malignant neoplasm of unspecified site of unspecified female breast: Secondary | ICD-10-CM | POA: Diagnosis not present

## 2011-06-27 DIAGNOSIS — Z9010 Acquired absence of unspecified breast and nipple: Secondary | ICD-10-CM | POA: Insufficient documentation

## 2011-06-27 DIAGNOSIS — Z170 Estrogen receptor positive status [ER+]: Secondary | ICD-10-CM | POA: Diagnosis not present

## 2011-06-27 DIAGNOSIS — I10 Essential (primary) hypertension: Secondary | ICD-10-CM | POA: Insufficient documentation

## 2011-06-27 DIAGNOSIS — F419 Anxiety disorder, unspecified: Secondary | ICD-10-CM | POA: Insufficient documentation

## 2011-06-27 DIAGNOSIS — C50911 Malignant neoplasm of unspecified site of right female breast: Secondary | ICD-10-CM

## 2011-06-27 DIAGNOSIS — G709 Myoneural disorder, unspecified: Secondary | ICD-10-CM | POA: Insufficient documentation

## 2011-06-27 NOTE — Progress Notes (Signed)
Right Breast Cancer,ER?PR=positive,Her 2 Neg.   Retired,Divorced, sister age 71 with Breast Cancer  Allergies: NKDA

## 2011-06-27 NOTE — Progress Notes (Signed)
Pt New consult  Has 2 jp drains right chest wall s/p mastectomy by Dr. Derrell Lolling, will see him tomorrow 06/28/11 1st drain dark red fluid, 2nd draining  serous drainage No c/o pain, took her pain medication today hydrocodone-apap 5/325mg   Takes 1-2 prn po, took today at 3pm 1 tab Pain relief,  Allergies:nkda

## 2011-06-28 ENCOUNTER — Ambulatory Visit (INDEPENDENT_AMBULATORY_CARE_PROVIDER_SITE_OTHER): Payer: Medicare Other | Admitting: General Surgery

## 2011-06-28 ENCOUNTER — Encounter (INDEPENDENT_AMBULATORY_CARE_PROVIDER_SITE_OTHER): Payer: Self-pay | Admitting: General Surgery

## 2011-06-28 VITALS — BP 158/90 | HR 100 | Temp 98.2°F | Resp 18 | Ht 67.0 in | Wt 259.6 lb

## 2011-06-28 DIAGNOSIS — Z9889 Other specified postprocedural states: Secondary | ICD-10-CM

## 2011-06-28 NOTE — Progress Notes (Signed)
Subjective:     Patient ID: Diane Paul, female   DOB: [birth year], 71 y.o.   MRN: [id number]  HPI This patient underwent one year of neoadjuvant Femara for a large tumor in her right breast.  On June 11, 2011 she underwent right total mastectomy and sentinel node biopsy. Final pathology revealed invasive lobular carcinoma, 1.3 cm, receptor positive, HER-2-negative, positive LVI,    negative margins.  Nodes were negative.  She has seen Dr. Donnie Coffin and Dr. Michell Heinrich. Both of them have told her that they do not think she needs chemotherapy or radiation therapy.  She is feeling okay. Pain is not too bad. Sleeping at night. Still draining 20-35 cc per day from each of her 2 drains. Review of Systems     Objective:   Physical Exam Dressings removed and replaced. Mastectomy skin flaps looked very good. As infection residual fluid. Drainage is serosanguineous.    Assessment:         Invasive lobular carcinoma right breast, pathologic stage ypT1c,  ypN0., receptor positive, HER-2-negative  Doing well following one year of neoadjuvant Femara therapy.  Doing well 2 weeks following right total mastectomy and sentinel lobe biopsy.     Plan:      Return to see me in one week. Remove one or both drains at that time.   Iron suppliment recommended  Vicodin (#20) refill per patient request   Angelia Mould. Derrell Lolling, M.D., [facility] General and Minimally invasive Surgery Breast and Colorectal Surgery Office:   [phone] Pager:   [phone]

## 2011-06-28 NOTE — Patient Instructions (Signed)
Your right mastectomy wound looks good. No sign of infection or complication. We are going to leave the drains in another week. Keep a careful record of the drainage output.  Return to see Dr. Derrell Lolling in one week.  I recommended that you take a vitamin with extra iron daily.

## 2011-06-28 NOTE — Progress Notes (Signed)
CC:   Diane Paul, M.D., F.R.C.P.C. Diane Paul. Derrell Lolling, M.D.  DIAGNOSIS:  ypT1c ypN0 invasive lobular carcinoma of the right breast.  PREVIOUS INTERVENTIONS:  Right mastectomy and axillary lymph node dissection revealing an invasive lobular carcinoma with associated atypical lobular hyperplasia,focal lymphovascular invasion and negative margins; 0/8 lymph nodes positive.  Invasive disease measuring 1.3 cm with the deep margin at 0.5 cm.  ER/PR positive, HER-2 negative.  INTERVAL HISTORY:  Diane Paul reports for followup today.  I originally saw her in January of last year when she had a biopsy of a right breast mass revealing a lobular carcinoma which was ER and PR positive.  An MRI at that time estimated this mass to be about 5.2 cm.  No abnormal lymph nodes were noted.  A right lymph node was biopsied and was found to be negative.  She was placed on neoadjuvant hormonal therapy and did well. She had an excellent clinical response and underwent her mastectomy on February 4th  under the care of Dr. Derrell Lolling.  She had negative margins, negative nodes and residual disease measured about 1.3 cm.  She met with Dr. Donnie Coffin yesterday who recommended adjuvant antiestrogen therapy and she was sent to me for consideration of radiation.  In speaking with Diane Paul, she has some postoperative pain, but other than that, is doing well.  She is accompanied by her daughter.  PHYSICAL EXAMINATION:  She has 2 drains still in place.  She has a bandage over her right chest wall.  IMPRESSION:  ypT1c ypN0 invasive lobular carcinoma of the right breast status post neoadjuvant Femara and mastectomy.  RECOMMENDATIONS:  I spoke to [place] and her daughter at length regarding her diagnosis.  We discussed the indications for radiation are based on her pretreatment tumor characteristics.  We also discussed that MRI at times can overestimate the amount of disease so whether she had 4.9 cm of disease or 5.2 cm of  disease is really unknown.  She definitely did not have any positive lymph nodes at the beginning and certainly not after neoadjuvant hormonal therapy.  She had a great clinical as well as pathologic response to neoadjuvant Femara.  I told her I thought her risk of recurrence was somewhere in the neighborhood of the 5% to 8% range on the chest wall.  I told her that radiation could probably take that down to somewhere in the neighborhood of 2% to 3%.  We discussed the process of simulation and the placement of tattoos and the necessity of daily treatment for about 6 weeks.  We discussed skin redness, and fatigue as side effects.  We discussed the necessity of autologous tissue reconstruction should she pursue radiation.  At this point, she would like to talk to Dr. Derrell Lolling about these issues and make a decision at that time.  I told her I would be comfortable with treatment or with her not having treatment, the decision was ultimately up to her.  I did not schedule followup and asked her to give me a call if she is interested in pursuing postmastectomy radiation.  She agreed to do so.   ______________________________ Lurline Hare, M.D. SW/MEDQ  D:  06/28/2011  T:  06/28/2011  Job:  [id number]

## 2011-07-05 ENCOUNTER — Ambulatory Visit (INDEPENDENT_AMBULATORY_CARE_PROVIDER_SITE_OTHER): Payer: Medicare Other | Admitting: General Surgery

## 2011-07-05 ENCOUNTER — Encounter (INDEPENDENT_AMBULATORY_CARE_PROVIDER_SITE_OTHER): Payer: Self-pay | Admitting: General Surgery

## 2011-07-05 VITALS — BP 144/90 | HR 76 | Temp 97.8°F | Resp 18 | Ht 67.0 in | Wt 261.6 lb

## 2011-07-05 DIAGNOSIS — C50911 Malignant neoplasm of unspecified site of right female breast: Secondary | ICD-10-CM

## 2011-07-05 DIAGNOSIS — C50919 Malignant neoplasm of unspecified site of unspecified female breast: Secondary | ICD-10-CM

## 2011-07-05 NOTE — Patient Instructions (Signed)
You may start taking showers tomorrow.  You will be referred for physical therapy for your right shoulder.  You has been given a prescription for postmastectomy bra and prosthesis. He may take this to [facility] on [address]  Return to see Dr. Derrell Lolling in one month.

## 2011-07-05 NOTE — Progress Notes (Signed)
Subjective:     Patient ID: Diane Paul, female   DOB: 04/26/1941, 71 y.o.   MRN: [id number]  HPI This patient returns for a postop check following her right mastectomy.  She has invasive lobular carcinoma. She presented with a 4 cm tumor and underwent neoadjuvant Femara which down staged the tumor nicely. She elected to have a mastectomy without reconstruction. On June 11, 2011 she underwent right total mastectomy and sentinel node biopsy. Final pathology was invasive lobular carcinoma, 1.3 cm per, margins negative, positive lymphovascular invasion, 0/5 lymph nodes positive. Receptor positive, HER-2/neu negative. Final stage ypT1c, ypN0.  She has seen Dr. Donnie Coffin and he has told her to continue the letrozole.  He has seen Dr. Doreen Beam and they have decided that she does not need radiation therapy.  The drainage from her drainage is down to 15 cm a day or less.  Review of Systems     Objective:   Physical Exam Patient looks well. In no distress.  Right mastectomy skin flaps are healthy. Steri-Strips removed. Skin edges normal. No skin necrosis. No fluid. No infection. Both drains were removed.  Range of motion of right shoulder is about 90% of normal.    Assessment:     Invasive lobular carcinoma right breast, status post neoadjuvant letrozole.  Status post right total mastectomy and sentinel node biopsy, wounds are healing uneventfully.  Final pathology shows invasive lobular carcinoma, 1.3 cm, receptor positive, HER-2 negative, margins negative, stage ypT1c, yp N0.    Plan:     Continue letrozole and periodic followup with Dr. [name].  She is referred for physical therapy for range of motion exercises right shoulder.  She is given a range of motion exercise sheet from our office for her right shoulder exercises to begin immediately  She is given prescription for postmastectomy bra and prosthesis  Return to see me one month   Ahria Slappey M. Derrell Lolling, M.D.,  [facility] General and Minimally invasive Surgery Breast and Colorectal Surgery Office:   [phone] Pager:   [phone]

## 2011-08-02 ENCOUNTER — Encounter (INDEPENDENT_AMBULATORY_CARE_PROVIDER_SITE_OTHER): Payer: Self-pay | Admitting: General Surgery

## 2011-08-02 ENCOUNTER — Ambulatory Visit (INDEPENDENT_AMBULATORY_CARE_PROVIDER_SITE_OTHER): Payer: Medicare Other | Admitting: General Surgery

## 2011-08-02 VITALS — BP 132/78 | HR 70 | Temp 97.4°F | Resp 16 | Ht 67.0 in | Wt 261.4 lb

## 2011-08-02 DIAGNOSIS — C50911 Malignant neoplasm of unspecified site of right female breast: Secondary | ICD-10-CM

## 2011-08-02 DIAGNOSIS — C50919 Malignant neoplasm of unspecified site of unspecified female breast: Secondary | ICD-10-CM

## 2011-08-02 NOTE — Progress Notes (Signed)
Subjective:     Patient ID: JYLA HOPF, female   DOB: Oct 14, 1940, 71 y.o.   MRN: [id number]  HPI This patient returns for postop followup regarding her right breast cancer.  She originally presented with a 4 cm tumor in her right breast. She received neoadjuvant Femara with good downsizing of her tumor. She elected total mastectomy without reconstruction. Feb. 4, 2013 she underwent right total mastectomy with sentinel node biopsy. Pathology revealed invasive lobular carcinoma, 1.3 cm, positive lymphovascular invasion, 0/5 sentinel lymph nodes positive, ER-positive, HER-2-negative. Pathologic stage ypT1 C., ypN0.   Dr. Michell Heinrich evaluated her and stated that she did not need adjuvant radiation therapy. She continues on letrozole.  She has no complaints about her right breast and the  patient thinks that her right shoulder feels pretty good. She is doing the exercises that I gave her. We talked about reconstructive issues and she states that she does not want to have reconstruction at this time. She states that she just simply wants to get well from this. She has  not filled her prescription for postmastectomy bra prosthesis yet but she has that.  Review of Systems     Objective:   Physical Exam Patient is here with her daughter. She feels good. She is in no distress. Spirits are good.  Extremities: Range of motion right shoulder is 95% or greater. very good. No arm swelling.  Right mastectomy wound is well-healed. There is no fluid collection or infection. Skin is healthy.    Assessment:     Invasive lobular carcinoma right breast, status post neoadjuvant Femara, followed by right total mastectomy and sentinel biopsy. Healing uneventfully in the early postop period  Range of motion right shoulder is very good  Final pathology is invasive carcinoma, ER-positive, HER-2 negative, as was his ypT1c, ypN0.    Plan:     Keep appointment with physical therapy.  Continue daily shoulder  exercises that I have given her  Okay to fill  prescription for postmastectomy bra and prosthesis.  Continue adjuvant femara.  Return to see me in 5 months.   Angelia Mould. Derrell Lolling, M.D., [facility] General and Minimally invasive Surgery Breast and Colorectal Surgery Office:   [phone] Pager:   [phone]

## 2011-08-02 NOTE — Patient Instructions (Signed)
Your right mastectomy wound looks good. Range of motion of the right shoulder looks good.  I strongly recommend that you continue daily range of motion exercises and physical therapy. Be sure to keep your appointment at the physical therapy department.  It is OK to go ahead and fill the  prescription for postmastectomy bra and prosthesis.  Return to see Dr. Derrell Lolling in 5 months.

## 2011-09-26 ENCOUNTER — Telehealth: Payer: Self-pay | Admitting: *Deleted

## 2011-09-26 ENCOUNTER — Ambulatory Visit (HOSPITAL_BASED_OUTPATIENT_CLINIC_OR_DEPARTMENT_OTHER): Payer: Medicare Other | Admitting: Oncology

## 2011-09-26 VITALS — BP 147/86 | HR 86 | Temp 97.8°F | Ht 67.0 in | Wt 267.0 lb

## 2011-09-26 DIAGNOSIS — Z9010 Acquired absence of unspecified breast and nipple: Secondary | ICD-10-CM

## 2011-09-26 DIAGNOSIS — C50919 Malignant neoplasm of unspecified site of unspecified female breast: Secondary | ICD-10-CM

## 2011-09-26 DIAGNOSIS — C50911 Malignant neoplasm of unspecified site of right female breast: Secondary | ICD-10-CM

## 2011-09-26 DIAGNOSIS — Z170 Estrogen receptor positive status [ER+]: Secondary | ICD-10-CM | POA: Diagnosis not present

## 2011-09-26 DIAGNOSIS — E559 Vitamin D deficiency, unspecified: Secondary | ICD-10-CM

## 2011-09-26 NOTE — Progress Notes (Signed)
Hematology and Oncology Follow Up Visit  Diane Paul [id number] Nov 24, 1940 71 y.o. 09/26/2011 10:52 AM   Principle Diagnosis: Locally advanced er/pr+ breast cancer on neoadjuavant femara therapy since 1/12.  Interim History:  Doing well without complain, s/p mastectomy on 06/11/11, with residual yT1CN0 tumor  Medications: I have reviewed the patient's current medications.  Allergies: No Known Allergies  Past Medical History, Surgical history, Social history, and Family History were reviewed and updated.  Review of Systems: Constitutional:  Negative for fever, chills, night sweats, anorexia, weight loss, pain. Cardiovascular: no chest pain or dyspnea on exertion Respiratory: no cough, shortness of breath, or wheezing Neurological: no TIA or stroke symptoms Dermatological: negative ENT: negative Skin Gastrointestinal: no abdominal pain, change in bowel habits, or black or bloody stools Genito-Urinary: no dysuria, trouble voiding, or hematuria Hematological and Lymphatic: negative Breast: negative for breast lumps Musculoskeletal: negative Remaining ROS negative.  Physical Exam: Blood pressure 147/86, pulse 86, temperature 97.8 F (36.6 C), height 5\' 7"  (1.702 m), weight 267 lb (121.11 kg). ECOG: 0 General appearance: alert, cooperative and appears stated age Head: Normocephalic, without obvious abnormality, atraumatic Neck: no adenopathy, no carotid bruit, no JVD, supple, symmetrical, trachea midline and thyroid not enlarged, symmetric, no tenderness/mass/nodules Lymph nodes: Cervical, supraclavicular, and axillary nodes normal. Cardiac : Normal Pulmonary: Normal , s/p mastectomy.drain in place. Breasts: Right - s/p mrm , left breast-normal Abdomen: Protuberant, no masses Extremities normal left leg edema-chronic Neuro: Normal  Lab Results: Lab Results  Component Value Date   WBC 7.8 06/01/2011   HGB 12.6 06/01/2011   HCT 39.4 06/01/2011   MCV 81.7 06/01/2011   PLT 256  06/01/2011     Chemistry      Component Value Date/Time   NA 140 06/01/2011 1305   K 4.0 06/01/2011 1305   CL 103 06/01/2011 1305   CO2 28 06/01/2011 1305   BUN 15 06/01/2011 1305   CREATININE 1.01 06/01/2011 1305      Component Value Date/Time   CALCIUM 10.0 06/01/2011 1305   ALKPHOS 97 06/01/2011 1305   AST 17 06/01/2011 1305   ALT 19 06/01/2011 1305   BILITOT 0.2* 06/01/2011 1305       Radiological Studies: chest X-ray n/a Mammogram F/u 2/14 Bone density n/a  Impression and Plan: F/u mastectomy , final path residual 1.3 cm er+ tumor, -ve margins; 0/8 nodes .  Excellent response will continue letrozole.. F/u 12 months, alternate visits with surgery. Most recent mammogram in February was within normal limits. She sees a Careers adviser I will see her in the year. She is doing well tolerating Femara well. She the bone density test in January of 2014. More than 50% of the visit was spent in patient-related counselling   [name], MD 5/22/201310:52 AM

## 2011-09-26 NOTE — Telephone Encounter (Signed)
gave patient appointment for 09-25-2012 md lab appointment for 09-18-2012 one week before the md appointment called breast center and made the patient mammogram and bone density appointment also

## 2011-10-17 ENCOUNTER — Encounter (INDEPENDENT_AMBULATORY_CARE_PROVIDER_SITE_OTHER): Payer: Self-pay | Admitting: General Surgery

## 2011-10-26 DIAGNOSIS — I10 Essential (primary) hypertension: Secondary | ICD-10-CM | POA: Diagnosis not present

## 2012-07-02 ENCOUNTER — Ambulatory Visit: Payer: Medicare Other

## 2012-07-02 ENCOUNTER — Other Ambulatory Visit: Payer: Medicare Other

## 2012-07-26 ENCOUNTER — Telehealth: Payer: Self-pay | Admitting: *Deleted

## 2012-07-26 NOTE — Telephone Encounter (Signed)
Left message w/ pt's daughter [name] to have pt call me so I can get her rescheduled.

## 2012-07-28 ENCOUNTER — Encounter: Payer: Self-pay | Admitting: Oncology

## 2012-07-28 ENCOUNTER — Telehealth: Payer: Self-pay | Admitting: *Deleted

## 2012-07-28 NOTE — Telephone Encounter (Signed)
Pt returned our call and I confirmed 09/19/12 appt w/ pt.  Mailed letter & calendar to pt.

## 2012-07-30 ENCOUNTER — Ambulatory Visit
Admission: RE | Admit: 2012-07-30 | Discharge: 2012-07-30 | Disposition: A | Discharge status: Home / Self Care | Payer: Medicare Other | Source: Ambulatory Visit | Attending: Oncology | Admitting: Oncology

## 2012-07-30 DIAGNOSIS — C50911 Malignant neoplasm of unspecified site of right female breast: Secondary | ICD-10-CM

## 2012-07-30 DIAGNOSIS — E559 Vitamin D deficiency, unspecified: Secondary | ICD-10-CM

## 2012-07-30 DIAGNOSIS — Z1231 Encounter for screening mammogram for malignant neoplasm of breast: Secondary | ICD-10-CM | POA: Diagnosis not present

## 2012-07-30 DIAGNOSIS — M899 Disorder of bone, unspecified: Secondary | ICD-10-CM | POA: Diagnosis not present

## 2012-07-30 DIAGNOSIS — M949 Disorder of cartilage, unspecified: Secondary | ICD-10-CM | POA: Diagnosis not present

## 2012-08-09 ENCOUNTER — Other Ambulatory Visit: Payer: Self-pay | Admitting: Oncology

## 2012-08-09 DIAGNOSIS — C50911 Malignant neoplasm of unspecified site of right female breast: Secondary | ICD-10-CM

## 2012-08-12 ENCOUNTER — Telehealth: Payer: Self-pay | Admitting: *Deleted

## 2012-08-12 ENCOUNTER — Other Ambulatory Visit: Payer: Self-pay | Admitting: Emergency Medicine

## 2012-08-12 NOTE — Telephone Encounter (Signed)
Pt's daughter called stating that the pt only has 2 pills left.  Got information of what medicine and which pharmacy and told her that I would give the information to Dr. Milta Deiters nurse.  I took information to [facility] to handle.

## 2012-09-18 ENCOUNTER — Other Ambulatory Visit: Payer: Medicare Other | Admitting: Lab

## 2012-09-19 ENCOUNTER — Encounter: Payer: Self-pay | Admitting: Oncology

## 2012-09-19 ENCOUNTER — Ambulatory Visit (HOSPITAL_BASED_OUTPATIENT_CLINIC_OR_DEPARTMENT_OTHER): Payer: Medicare Other | Admitting: Oncology

## 2012-09-19 ENCOUNTER — Ambulatory Visit (HOSPITAL_BASED_OUTPATIENT_CLINIC_OR_DEPARTMENT_OTHER): Payer: Medicare Other | Admitting: Lab

## 2012-09-19 ENCOUNTER — Telehealth: Payer: Self-pay | Admitting: Oncology

## 2012-09-19 VITALS — BP 137/80 | HR 103 | Temp 97.9°F | Resp 20 | Ht 67.0 in | Wt 264.4 lb

## 2012-09-19 DIAGNOSIS — C50911 Malignant neoplasm of unspecified site of right female breast: Secondary | ICD-10-CM

## 2012-09-19 DIAGNOSIS — C50919 Malignant neoplasm of unspecified site of unspecified female breast: Secondary | ICD-10-CM

## 2012-09-19 DIAGNOSIS — E559 Vitamin D deficiency, unspecified: Secondary | ICD-10-CM

## 2012-09-19 LAB — CBC WITH DIFFERENTIAL/PLATELET
BASO%: 0.3 % (ref 0.0–2.0)
Basophils Absolute: 0 10*3/uL (ref 0.0–0.1)
HCT: 38.9 % (ref 34.8–46.6)
LYMPH%: 32.2 % (ref 14.0–49.7)
MCHC: 32.2 g/dL (ref 31.5–36.0)
MONO#: 0.8 10*3/uL (ref 0.1–0.9)
NEUT%: 54.9 % (ref 38.4–76.8)
Platelets: 233 10*3/uL (ref 145–400)
WBC: 7.7 10*3/uL (ref 3.9–10.3)

## 2012-09-19 LAB — COMPREHENSIVE METABOLIC PANEL (CC13)
ALT: 29 U/L (ref 0–55)
BUN: 16.8 mg/dL (ref 7.0–26.0)
CO2: 24 mEq/L (ref 22–29)
Creatinine: 1.2 mg/dL — ABNORMAL HIGH (ref 0.6–1.1)
Glucose: 101 mg/dl — ABNORMAL HIGH (ref 70–99)
Total Bilirubin: 0.25 mg/dL (ref 0.20–1.20)

## 2012-09-19 MED ORDER — LETROZOLE 2.5 MG PO TABS: 2.5000 mg | ORAL_TABLET | Freq: Every day | ORAL | Status: DC

## 2012-09-19 MED ORDER — ALENDRONATE SODIUM 70 MG PO TABS: 70.0000 mg | ORAL_TABLET | ORAL | Status: DC

## 2012-09-19 NOTE — Patient Instructions (Addendum)
#1 please take vitamin D 1000 international units daily  #2 please take calcium (Caltrate) 2 daily  #3 I have started you on Fosamax 70 mg once a week. You must take this first thing in the morning on an empty stomach with 2 glasses or water then stay upright for one hour prior to eating or drinking anything further. Further information is as below  #4 I will plan on seeing you back in 6 months time for followup with blood workAlendronate tablets What is this medicine? ALENDRONATE (a LEN droe nate) slows calcium loss from bones. It helps to make normal healthy bone and to slow bone loss in people with Paget's disease and osteoporosis. It may be used in others at risk for bone loss. This medicine may be used for other purposes; ask your health care provider or pharmacist if you have questions. What should I tell my health care provider before I take this medicine? They need to know if you have any of these conditions: -dental disease -esophagus, stomach, or intestine problems, like acid reflux or GERD -kidney disease -low blood calcium -low vitamin D -problems sitting or standing 30 minutes -trouble swallowing -an unusual or allergic reaction to alendronate, other medicines, foods, dyes, or preservatives -pregnant or trying to get pregnant -breast-feeding How should I use this medicine? You must take this medicine exactly as directed or you will lower the amount of the medicine you absorb into your body or you may cause yourself harm. Take this medicine by mouth first thing in the morning, after you are up for the day. Do not eat or drink anything before you take your medicine. Swallow the tablet with a full glass (6 to 8 fluid ounces) of plain water. Do not take this medicine with any other drink. Do not chew or crush the tablet. After taking this medicine, do not eat breakfast, drink, or take any medicines or vitamins for at least 30 minutes. Sit or stand up for at least 30 minutes after you  take this medicine; do not lie down. Do not take your medicine more often than directed. Talk to your pediatrician regarding the use of this medicine in children. Special care may be needed. Overdosage: If you think you have taken too much of this medicine contact a poison control center or emergency room at once. NOTE: This medicine is only for you. Do not share this medicine with others. What if I miss a dose? If you miss a dose, do not take it later in the day. Continue your normal schedule starting the next morning. Do not take double or extra doses. What may interact with this medicine? -aluminum hydroxide -antacids -aspirin -calcium supplements -drugs for inflammation like ibuprofen, naproxen, and others -iron supplements -magnesium supplements -vitamins with minerals This list may not describe all possible interactions. Give your health care provider a list of all the medicines, herbs, non-prescription drugs, or dietary supplements you use. Also tell them if you smoke, drink alcohol, or use illegal drugs. Some items may interact with your medicine. What should I watch for while using this medicine? Visit your doctor or health care professional for regular checks ups. It may be some time before you see benefit from this medicine. Do not stop taking your medicine except on your doctor's advice. Your doctor or health care professional may order blood tests and other tests to see how you are doing. You should make sure you get enough calcium and vitamin D while you are taking this medicine,  unless your doctor tells you not to. Discuss the foods you eat and the vitamins you take with your health care professional. Some people who take this medicine have severe bone, joint, and/or muscle pain. This medicine may also increase your risk for a broken thigh bone. Tell your doctor right away if you have pain in your upper leg or groin. Tell your doctor if you have any pain that does not go away or  that gets worse. This medicine can make you more sensitive to the sun. If you get a rash while taking this medicine, sunlight may cause the rash to get worse. Keep out of the sun. If you cannot avoid being in the sun, wear protective clothing and use sunscreen. Do not use sun lamps or tanning beds/booths. What side effects may I notice from receiving this medicine? Side effects that you should report to your doctor or health care professional as soon as possible: -allergic reactions like skin rash, itching or hives, swelling of the face, lips, or tongue -black or tarry stools -bone, muscle or joint pain -changes in vision -chest pain -heartburn or stomach pain -jaw pain, especially after dental work -pain or trouble when swallowing -redness, blistering, peeling or loosening of the skin, including inside the mouth Side effects that usually do not require medical attention (report to your doctor or health care professional if they continue or are bothersome): -changes in taste -diarrhea or constipation -eye pain or itching -headache -nausea or vomiting -stomach gas or fullness This list may not describe all possible side effects. Call your doctor for medical advice about side effects. You may report side effects to FDA at 1-800-FDA-1088. Where should I keep my medicine? Keep out of the reach of children. Store at room temperature of 15 and 30 degrees C (59 and 86 degrees F). Throw away any unused medicine after the expiration date. NOTE: This sheet is a summary. It may not cover all possible information. If you have questions about this medicine, talk to your doctor, pharmacist, or health care provider.  2013, Elsevier/Gold Standard. (10/20/2010 8:56:09 AM)

## 2012-09-19 NOTE — Progress Notes (Signed)
OFFICE PROGRESS NOTE  CC  Diane Grosser, MD [address] [place] [address] Dr. [name]  DIAGNOSIS: 72 year old female with history of stage II ER positive right breast cancer.  PRIOR THERAPY:  #1 patient originally presented with a 4 cm tumor in her right breast. She received neoadjuvant letrozole with downsizing of her tumor.this was then 2012  #2 she in 06/11/2011 underwent a total mastectomy with sentinel node biopsy. Her pathology revealed invasive lobular carcinoma, measuring 1.3 cm positive for lymphovascular invasion 0 of 5 lymph nodes positive for metastatic disease. Tumor was ER positive HER-2/neu negative. Her pathologic stage was T1 C. N0.  #3 patient was seen by Dr. Michell Heinrich she did not require adjuvant radiation therapy.  #4 patient subsequently was continued on letrozole 2.5 mg daily overall she's tolerating it well.  CURRENT THERAPY:letrozole 2.5 mg daily  INTERVAL HISTORY: Diane Paul 72 y.o. female returns for followup visit today overall she is doing well she has no complaints from the letrozole. She denies any fevers chills night sweats headaches. She does complain of shortness of breath sometimes she does have some dryness of the mouth she attributes this to the letrozole. She occasionally does get aches and pains but they are tolerable. Remainder of the 10 point review of systems is negative.  MEDICAL HISTORY: Past Medical History  Diagnosis Date  . Anemia   . Blood transfusion     post vaginal birth- 18   . Shortness of breath     sometimes   . Dry mouth   . Cancer     R breast cancer  . Arthritis     knees ?  Marland Kitchen Use of letrozole (Femara) 05/2010    neoadjuvant femara therapy since 1/212  . Anxiety     nervous sometimes, denies panica ttacks   . Hypertension   . Neuromuscular disorder     L leg, thigh- "burns sometimes"  . Breast cancer     Right Breast Cancer    ALLERGIES:  has No Known Allergies.  MEDICATIONS:   Current Outpatient Prescriptions  Medication Sig Dispense Refill  . amLODipine (NORVASC) 5 MG tablet Take 5 mg by mouth daily at 2 PM daily at 2 PM.       . B Complex-C-Folic Acid (MULTIVITAMIN, STRESS FORMULA) tablet Take 1 tablet by mouth daily. gummie bear MVT      . Calcium Carbonate-Vit D-Min (CALTRATE PLUS PO) Take by mouth. 1200MG  OF CALCIUM & 800IU OF VITAMIN D       . letrozole (FEMARA) 2.5 MG tablet Take 1 tablet (2.5 mg total) by mouth daily.  90 tablet  12   No current facility-administered medications for this visit.    SURGICAL HISTORY:  Past Surgical History  Procedure Laterality Date  . Abdominal hysterectomy    . Tubal ligation    . Breast surgery      Right  . Mastectomy w/ sentinel node biopsy  06/11/2011/Right BReast    Procedure: MASTECTOMY WITH SENTINEL LYMPH NODE BIOPSY;  Surgeon: Ernestene Mention, MD;  Location: [facility] OR;  Service: General;  Laterality: Right;  Right total mastectomy and sentinel lymph node biopsy using lymphatic mapping and blue dye injection.  . Jp drains      s/p masectomy 06/11/11/ Dr. [name], 2 jp drains right chest wall  . [place] fine needle aspiration wo/ w smear  05/22/10    Left Breast- cyst or abscess     REVIEW OF SYSTEMS:  Pertinent  items are noted in HPI.   HEALTH MAINTENANCE:   PHYSICAL EXAMINATION: Blood pressure 137/80, pulse 103, temperature 97.9 F (36.6 C), temperature source Oral, resp. rate 20, height 5\' 7"  (1.702 m), weight 264 lb 6.4 oz (119.931 kg). Body mass index is 41.4 kg/(m^2). ECOG PERFORMANCE STATUS: 0 - Asymptomatic   patient is awake alert in no acute distress well appearing female HEENT exam unremarkable Lungs are clear Cardiovascular regular rate rhythm Abdomen soft nontender no HSM Extremities trace edema Right chest wall no evidence of local recurrence no nodularity well-healed surgical scar Left breast no masses or nipple   LABORATORY DATA: Lab Results  Component Value Date   WBC 7.8 06/01/2011    HGB 12.6 06/01/2011   HCT 39.4 06/01/2011   MCV 81.7 06/01/2011   PLT 256 06/01/2011      Chemistry      Component Value Date/Time   NA 140 06/01/2011 1305   K 4.0 06/01/2011 1305   CL 103 06/01/2011 1305   CO2 28 06/01/2011 1305   BUN 15 06/01/2011 1305   CREATININE 1.01 06/01/2011 1305      Component Value Date/Time   CALCIUM 10.0 06/01/2011 1305   ALKPHOS 97 06/01/2011 1305   AST 17 06/01/2011 1305   ALT 19 06/01/2011 1305   BILITOT 0.2* 06/01/2011 1305       RADIOGRAPHIC STUDIES:  No results found.  ASSESSMENT: 72 year old female with  #1 preop stage II invasive ductal carcinoma she is status post neoadjuvant letrozole. With eventual total mastectomy with sentinel lymph node biopsy 06/11/2011. The final pathology revealed a invasive lobular carcinoma 1.3 cm with LV I 5 lymph nodes negative for tumor ER positive PR negative pathologic stage TI C. (stage I).  #2 postop patient continued to receive letrozole 2.5 mg she is tolerating it well without any problems total of 7 years is planned.   PLAN:   #1 continue letrozole 2.5 mg daily.  #2patient is also recommended to take vitamin D 3 1000 units daily as well as calcium 2 daily.  #3 I have also started her on Fosamax 70 mg once a week to take on an empty stomach. She did have a low bone mass.  #4 I will see her back in 6 months time for followup.   All questions were answered. The patient knows to call the clinic with any problems, questions or concerns. We can certainly see the patient much sooner if necessary.  I spent 25 minutes counseling the patient face to face. The total time spent in the appointment was 30 minutes.    Drue Second, MD Medical/Oncology [facility] [phone] (beeper) [phone] (Office)  09/19/2012, 11:27 AM

## 2012-09-25 ENCOUNTER — Other Ambulatory Visit: Payer: Medicare Other | Admitting: Lab

## 2012-09-25 ENCOUNTER — Ambulatory Visit: Payer: Medicare Other | Admitting: Oncology

## 2012-10-02 ENCOUNTER — Other Ambulatory Visit: Payer: Self-pay | Admitting: Geriatric Medicine

## 2012-11-28 ENCOUNTER — Encounter: Payer: Self-pay | Admitting: Family Medicine

## 2012-11-28 ENCOUNTER — Ambulatory Visit (INDEPENDENT_AMBULATORY_CARE_PROVIDER_SITE_OTHER): Payer: Medicare Other | Admitting: Family Medicine

## 2012-11-28 ENCOUNTER — Other Ambulatory Visit: Payer: Self-pay | Admitting: Family Medicine

## 2012-11-28 VITALS — BP 150/90 | HR 92 | Temp 98.0°F | Resp 18 | Wt 262.0 lb

## 2012-11-28 DIAGNOSIS — S6000XA Contusion of unspecified finger without damage to nail, initial encounter: Secondary | ICD-10-CM | POA: Diagnosis not present

## 2012-11-28 DIAGNOSIS — S6010XA Contusion of unspecified finger with damage to nail, initial encounter: Secondary | ICD-10-CM

## 2012-11-28 DIAGNOSIS — I10 Essential (primary) hypertension: Secondary | ICD-10-CM

## 2012-11-28 NOTE — Progress Notes (Signed)
Subjective:    Patient ID: Diane Paul, female    DOB: 1940-09-24, 72 y.o.   MRN: [id number]  HPI  2 weeks ago patient slammed her right 4th finger in a door.  She is concerned because there is a black subungual hematoma that has persisted for 2 weeks.  She has full range of motion in the PIP and DIP joints on that finger. There is no erythema or swelling in the finger. The finger is nontender. There is no evidence of cellulitis with paronychia.  She also has hypertension and is taking Norvasc 5 mg by mouth daily. She denies chest pain or shortness of breath. Past Medical History  Diagnosis Date  . Anemia   . Blood transfusion     post vaginal birth- 83   . Shortness of breath     sometimes   . Dry mouth   . Cancer     R breast cancer  . Arthritis     knees ?  Marland Kitchen Use of letrozole (Femara) 05/2010    neoadjuvant femara therapy since 1/212  . Anxiety     nervous sometimes, denies panica ttacks   . Hypertension   . Neuromuscular disorder     L leg, thigh- "burns sometimes"  . Breast cancer     Right Breast Cancer   Current Outpatient Prescriptions on File Prior to Visit  Medication Sig Dispense Refill  . amLODipine (NORVASC) 5 MG tablet Take 5 mg by mouth daily at 2 PM daily at 2 PM.       . B Complex-C-Folic Acid (MULTIVITAMIN, STRESS FORMULA) tablet Take 1 tablet by mouth daily. gummie bear MVT      . Calcium Carbonate-Vit D-Min (CALTRATE PLUS PO) Take by mouth. 1200MG  OF CALCIUM & 800IU OF VITAMIN D       . letrozole (FEMARA) 2.5 MG tablet Take 1 tablet (2.5 mg total) by mouth daily.  90 tablet  12  . alendronate (FOSAMAX) 70 MG tablet Take 1 tablet (70 mg total) by mouth every 7 (seven) days. Take with a full glass of water on an empty stomach.  12 tablet  6   No current facility-administered medications on file prior to visit.   No Known Allergies History   Social History  . Marital Status: Single    Spouse Name: N/A    Number of Children: 5  . Years of  Education: N/A   Occupational History  . Retired    Social History Main Topics  . Smoking status: Never Smoker   . Smokeless tobacco: Never Used  . Alcohol Use: No  . Drug Use: No  . Sexually Active: Not Currently    Birth Control/ Protection: Post-menopausal   Other Topics Concern  . Not on file   Social History Narrative  . No narrative on file     Review of Systems  All other systems reviewed and are negative.       Objective:   Physical Exam  Vitals reviewed. Cardiovascular: Normal rate, regular rhythm and normal heart sounds.   No murmur heard. Pulmonary/Chest: Effort normal and breath sounds normal. No respiratory distress. She has no wheezes. She has no rales.   right fourth digit-femoral and painless range of motion in the PIP and DIP joints. There is no swelling in the finger. There is no erythema. There is no pain. There is a subungual hematoma that encompasses half of the nailbed.        Assessment & Plan:  1.  Subungual hematoma of finger, initial encounter Patient has no sign of fracture or infection. It has been over 2 weeks. At this point the hematoma cannot longer be drained. I recommended tincture of time and for the patient to allow the nail to grow out over the next 4 weeks. The hematoma will then gradually disappeared.  2. HTN (hypertension) Increase Norvasc to 10 mg by mouth daily and recheck blood pressure in 2 weeks.

## 2013-02-09 ENCOUNTER — Other Ambulatory Visit: Payer: Self-pay | Admitting: *Deleted

## 2013-02-09 DIAGNOSIS — C50911 Malignant neoplasm of unspecified site of right female breast: Secondary | ICD-10-CM

## 2013-02-09 MED ORDER — LETROZOLE 2.5 MG PO TABS: 2.5000 mg | ORAL_TABLET | Freq: Every day | ORAL | Status: DC

## 2013-03-02 ENCOUNTER — Telehealth: Payer: Self-pay | Admitting: Oncology

## 2013-03-20 ENCOUNTER — Other Ambulatory Visit: Payer: Medicare Other | Admitting: Lab

## 2013-03-20 ENCOUNTER — Ambulatory Visit: Payer: Medicare Other | Admitting: Oncology

## 2013-03-27 ENCOUNTER — Other Ambulatory Visit (HOSPITAL_BASED_OUTPATIENT_CLINIC_OR_DEPARTMENT_OTHER): Payer: Medicare Other | Admitting: Lab

## 2013-03-27 ENCOUNTER — Encounter: Payer: Self-pay | Admitting: Family

## 2013-03-27 ENCOUNTER — Telehealth: Payer: Self-pay | Admitting: Oncology

## 2013-03-27 ENCOUNTER — Ambulatory Visit (HOSPITAL_BASED_OUTPATIENT_CLINIC_OR_DEPARTMENT_OTHER): Payer: Medicare Other | Admitting: Family

## 2013-03-27 ENCOUNTER — Encounter (INDEPENDENT_AMBULATORY_CARE_PROVIDER_SITE_OTHER): Payer: Self-pay

## 2013-03-27 VITALS — BP 151/82 | HR 110 | Temp 97.7°F | Resp 18 | Ht 67.0 in | Wt 259.6 lb

## 2013-03-27 DIAGNOSIS — M899 Disorder of bone, unspecified: Secondary | ICD-10-CM | POA: Diagnosis not present

## 2013-03-27 DIAGNOSIS — M8580 Other specified disorders of bone density and structure, unspecified site: Secondary | ICD-10-CM

## 2013-03-27 DIAGNOSIS — C50911 Malignant neoplasm of unspecified site of right female breast: Secondary | ICD-10-CM

## 2013-03-27 DIAGNOSIS — C50919 Malignant neoplasm of unspecified site of unspecified female breast: Secondary | ICD-10-CM | POA: Diagnosis not present

## 2013-03-27 LAB — COMPREHENSIVE METABOLIC PANEL (CC13)
Anion Gap: 10 mEq/L (ref 3–11)
CO2: 25 mEq/L (ref 22–29)
Calcium: 9.7 mg/dL (ref 8.4–10.4)
Chloride: 107 mEq/L (ref 98–109)
Creatinine: 1 mg/dL (ref 0.6–1.1)
Glucose: 109 mg/dl (ref 70–140)
Total Bilirubin: 0.27 mg/dL (ref 0.20–1.20)
Total Protein: 8 g/dL (ref 6.4–8.3)

## 2013-03-27 LAB — CBC WITH DIFFERENTIAL/PLATELET
Basophils Absolute: 0 10*3/uL (ref 0.0–0.1)
Eosinophils Absolute: 0.2 10*3/uL (ref 0.0–0.5)
HCT: 38.1 % (ref 34.8–46.6)
HGB: 12 g/dL (ref 11.6–15.9)
LYMPH%: 31 % (ref 14.0–49.7)
MONO#: 0.7 10*3/uL (ref 0.1–0.9)
NEUT#: 3.8 10*3/uL (ref 1.5–6.5)
NEUT%: 55.4 % (ref 38.4–76.8)
Platelets: 249 10*3/uL (ref 145–400)
WBC: 6.8 10*3/uL (ref 3.9–10.3)
lymph#: 2.1 10*3/uL (ref 0.9–3.3)

## 2013-03-27 MED ORDER — LETROZOLE 2.5 MG PO TABS: 2.5000 mg | ORAL_TABLET | Freq: Every day | ORAL | Status: DC

## 2013-03-27 NOTE — Patient Instructions (Addendum)
Please contact us at [phone] if you have any questions or concerns.  Please continue to do well and enjoy life!!!  Get plenty of rest, drink plenty of water, exercise daily (walking as tolerated), eat a balanced diet.  Continue to take calcium 600 mg daily in addition to vitamin D3 1000 IUs daily.   Complete monthly self-breast examinations.  Have a clinical breast exam by a physician every year.  Have your mammogram completed every year.  Results for orders placed in visit on 03/27/13 (from the past 24 hour(s))  CBC WITH DIFFERENTIAL     Status: None   Collection Time    03/27/13 10:21 AM      Result Value Range   WBC 6.8  3.9 - 10.3 10e3/uL   NEUT# 3.8  1.5 - 6.5 10e3/uL   HGB 12.0  11.6 - 15.9 g/dL   HCT 16.1  09.6 - 04.5 %   Platelets 249  145 - 400 10e3/uL   MCV 81.4  79.5 - 101.0 fL   MCH 25.6  25.1 - 34.0 pg   MCHC 31.5  31.5 - 36.0 g/dL   RBC 4.09  8.11 - 9.14 10e6/uL   RDW 14.4  11.2 - 14.5 %   lymph# 2.1  0.9 - 3.3 10e3/uL   MONO# 0.7  0.1 - 0.9 10e3/uL   Eosinophils Absolute 0.2  0.0 - 0.5 10e3/uL   Basophils Absolute 0.0  0.0 - 0.1 10e3/uL   NEUT% 55.4  38.4 - 76.8 %   LYMPH% 31.0  14.0 - 49.7 %   MONO% 10.7  0.0 - 14.0 %   EOS% 2.5  0.0 - 7.0 %   BASO% 0.4  0.0 - 2.0 %   Narrative:    Performed At:  [facility]               [address]               [place], [place] [address]   Denosumab injection What is this medicine? DENOSUMAB (den oh sue mab) slows bone breakdown. Prolia is used to treat osteoporosis in women after menopause and in men. Rivka Barbara is used to prevent bone fractures and other bone problems caused by cancer bone metastases. Rivka Barbara is also used to treat giant cell tumor of the bone. This medicine may be used for other purposes; ask your health care provider or pharmacist if you have questions. COMMON BRAND NAME(S): Prolia, XGEVA  What should I tell my health care provider before I take this medicine? They need to know if you  have any of these conditions: -dental disease -eczema -infection or history of infections -kidney disease or on dialysis -low blood calcium or vitamin D -malabsorption syndrome -scheduled to have surgery or tooth extraction -taking medicine that contains denosumab -thyroid or parathyroid disease -an unusual reaction to denosumab, other medicines, foods, dyes, or preservatives -pregnant or trying to get pregnant -breast-feeding  How should I use this medicine? This medicine is for injection under the skin. It is given by a health care professional in a hospital or clinic setting. If you are getting Prolia, a special MedGuide will be given to you by the pharmacist with each prescription and refill. Be sure to read this information carefully each time. For Prolia, talk to your pediatrician regarding the use of this medicine in children. Special care may be needed. For Rivka Barbara, talk to your pediatrician regarding the use of this medicine in children. While this drug  may be prescribed for children as young as 13 years for selected conditions, precautions do apply. Overdosage: If you think you've taken too much of this medicine contact a poison control center or emergency room at once. Overdosage: If you think you have taken too much of this medicine contact a poison control center or emergency room at once. NOTE: This medicine is only for you. Do not share this medicine with others.  What if I miss a dose? It is important not to miss your dose. Call your doctor or health care professional if you are unable to keep an appointment.  What may interact with this medicine? Do not take this medicine with any of the following medications: -other medicines containing denosumab This medicine may also interact with the following medications: -medicines that suppress the immune system -medicines that treat cancer -steroid medicines like prednisone or cortisone This list may not describe all possible  interactions. Give your health care provider a list of all the medicines, herbs, non-prescription drugs, or dietary supplements you use. Also tell them if you smoke, drink alcohol, or use illegal drugs. Some items may interact with your medicine. What should I watch for while using this medicine? Visit your doctor or health care professional for regular checks on your progress. Your doctor or health care professional may order blood tests and other tests to see how you are doing. Call your doctor or health care professional if you get a cold or other infection while receiving this medicine. Do not treat yourself. This medicine may decrease your body's ability to fight infection. You should make sure you get enough calcium and vitamin D while you are taking this medicine, unless your doctor tells you not to. Discuss the foods you eat and the vitamins you take with your health care professional. See your dentist regularly. Brush and floss your teeth as directed. Before you have any dental work done, tell your dentist you are receiving this medicine. Do not become pregnant while taking this medicine or for 5 months after stopping it. Women should inform their doctor if they wish to become pregnant or think they might be pregnant. There is a potential for serious side effects to an unborn child. Talk to your health care professional or pharmacist for more information.  What side effects may I notice from receiving this medicine? Side effects that you should report to your doctor or health care professional as soon as possible: -allergic reactions like skin rash, itching or hives, swelling of the face, lips, or tongue -breathing problems -chest pain -fast, irregular heartbeat -feeling faint or lightheaded, falls -fever, chills, or any other sign of infection -muscle spasms, tightening, or twitches -numbness or tingling -skin blisters or bumps, or is dry, peels, or red -slow healing or unexplained pain  in the mouth or jaw -unusual bleeding or bruising Side effects that usually do not require medical attention (Report these to your doctor or health care professional if they continue or are bothersome.): -muscle pain -stomach upset, gas This list may not describe all possible side effects. Call your doctor for medical advice about side effects. You may report side effects to FDA at 1-800-FDA-1088.  Where should I keep my medicine? This medicine is only given in a clinic, doctor's office, or other health care setting and will not be stored at home. NOTE: This sheet is a summary. It may not cover all possible information. If you have questions about this medicine, talk to your doctor, pharmacist, or health care  provider.  2014, Elsevier/Gold Standard. (2011-10-22 12:37:47)

## 2013-03-27 NOTE — Progress Notes (Addendum)
[facility]  [phone] [phone]  OFFICE PROGRESS NOTE  ID: Diane Paul   DOB: Dec 31, 1940  MR#: [id number]  JYN#:[id number]   PCP: Leo Grosser, MD SU:  [name], MD    DIAGNOSIS: Diane Paul is a 72 y.o. female with history of stage II, invasive lobular carcinoma of the right breast, estrogen receptor positive, diagnosed in 05/2010.   PRIOR THERAPY: #1 Right breast needle core biopsy with right axillary lymph node biopsy on 05/18/2010 which showed invasive and in situ mammary carcinoma in the right breast pathology, estrogen receptor 98% positive, progesterone receptor 97% positive, Ki-67 36%, HER-2/neu by CISH no amplification.  Right axillary lymph node biopsy pathology showed no evidence of carcinoma in one lymph node.  #2 Bilateral breast MRI on 05/24/2010 showed within the lateral midportion of the right breast, there was a spiculated enhancing mass which measured 5.4 x 4.6 x 4.4 cm.  Findings were consistent with biopsy invasive ductal carcinoma/ductal carcinoma in situ.  Surrounding the biopsy, there was tissue edema.  There was edema in the right axilla, possibly related to biopsy.  No suspicious axillary lymph nodes were identified.  There was lateral deviation of the right nipple.  No other suspicious enhancement on the right.  Images of the left breast were unremarkable.  #3 Status post right breast simple mastectomy with right axillary sentinel lymph node biopsy on 06/11/2011 for a stage I, ypT1c, ypN0, 1.3 cm invasive lobular carcinoma, grade 1, with atypical lobular hyperplasia and focal lymphovascular invasion identified, surgical margins were negative for carcinoma, estrogen receptor 91% positive, progesterone receptor 25% positive, Ki-67 not provided in pathology report, HER-2/neu by CISH no amplification, with 0/8 metastatic right axillary lymph nodes.  #4 Patient was seen by Dr. Michell Heinrich.  She did not require adjuvant  radiation therapy.  #5 Continued antiestrogen therapy with Letrozole 2.5 mg by mouth daily adjuvantly.   CURRENT THERAPY:  Letrozole 2.5 mg by mouth daily.   A total of 7 years of antiestrogen therapy as planned.   INTERVAL HISTORY: Diane Paul is a 72 y.o. female who returns for followup of right breast invasive lobular carcinoma status post simple mastectomy.  She is accompanied by her daughter [name] for today's office visit.  Since her last office visit on 09/19/2012 she has been doing relatively well with the exception of knee and hip pain while taking Fosamax 70 mg by mouth Q7 days.  She is states she took Fosamax for approximately 3 weeks and then stopped secondary to bony pain.  She states her bony pain has improved since she stopped taking Fosamax.  She also has complaints of hot flashes that she states are tolerable.  Her interval history is otherwise unremarkable and stable.   MEDICAL HISTORY: Past Medical History  Diagnosis Date  . Anemia   . Blood transfusion     post vaginal birth- 79   . Shortness of breath     sometimes   . Dry mouth   . Cancer     R breast cancer  . Arthritis     knees ?  Marland Kitchen Use of letrozole (Femara) 05/2010    neoadjuvant femara therapy since 1/212  . Anxiety     nervous sometimes, denies panica ttacks   . Hypertension   . Neuromuscular disorder     L leg, thigh- "burns sometimes"  . Breast cancer     Right Breast Cancer    ALLERGIES: Allergies  Allergen Reactions  . Fosamax [  Alendronate Sodium] Other (See Comments)    Aches and pains      MEDICATIONS:  Current Outpatient Prescriptions  Medication Sig Dispense Refill  . amLODipine (NORVASC) 5 MG tablet TAKE 1 TABLET BY MOUTH EVERY DAY  30 tablet  4  . B Complex-C-Folic Acid (MULTIVITAMIN, STRESS FORMULA) tablet Take 1 tablet by mouth daily. gummie bear MVT      . Calcium Carbonate-Vit D-Min (CALTRATE PLUS PO) Take by mouth. 1200MG  OF CALCIUM & 800IU OF VITAMIN D       .  letrozole (FEMARA) 2.5 MG tablet Take 1 tablet (2.5 mg total) by mouth daily.  90 tablet  5  . polyethylene glycol (MIRALAX / GLYCOLAX) packet Take 17 g by mouth daily as needed.       No current facility-administered medications for this visit.    SURGICAL HISTORY:  Past Surgical History  Procedure Laterality Date  . Abdominal hysterectomy    . Tubal ligation    . Breast surgery      Right  . Mastectomy w/ sentinel node biopsy  06/11/2011/Right BReast    Procedure: MASTECTOMY WITH SENTINEL LYMPH NODE BIOPSY;  Surgeon: Ernestene Mention, MD;  Location: [facility] OR;  Service: General;  Laterality: Right;  Right total mastectomy and sentinel lymph node biopsy using lymphatic mapping and blue dye injection.  . Jp drains      s/p masectomy 06/11/11/ Dr. [name], 2 jp drains right chest wall  . [place] fine needle aspiration wo/ w smear  05/22/10    Left Breast- cyst or abscess     REVIEW OF SYSTEMS:  Pertinent items are noted in HPI.  A 10 point review of systems was completed and is negative except as noted above.  [name] denies any other symptomatology including  fatigue, fever or chills, headache, vision changes, swollen glands, cough or shortness of breath, chest pain or discomfort, nausea, vomiting, diarrhea, constipation, change in urinary or bowel habits, any other arthralgias/myalgias, unusual bleeding/bruising or any other symptomatology.  PHYSICAL EXAMINATION: There were no vitals taken for this visit. There is no weight on file to calculate BMI.  ECOG PERFORMANCE STATUS: 1 - Symptomatic but completely ambulatory  General appearance: Alert, cooperative, well nourished, no apparent distress Head: Normocephalic, without obvious abnormality, atraumatic, the patient is wearing a weight, dentures Eyes: Arcus senilis, PERRLA, EOMI Nose: Nares, septum and mucosa are normal, no drainage or sinus tenderness Neck: No adenopathy, supple, symmetrical, trachea midline, no tenderness Resp: Clear  to auscultation bilaterally, no wheezes/rales/rhonchi Cardio: Regular rate and rhythm, S1, S2 normal, no murmur, click, rub or gallop, +3 bilateral edema/appears chronic Breasts:  Right breast is surgically absent, right chest wall area has well-healed surgical scars, left breast is pendulous with glandular tissue, no left breast nipple inversion, bilateral axillary fullness GI: Soft, distended, non-tender, hypoactive bowel sounds, excessive habitus Skin: No rashes/lesions, skin warm and dry, no erythematous areas, no cyanosis  M/S:  Atraumatic, normal strength in all extremities, normal range of motion, no clubbing  Lymph nodes: Cervical, supraclavicular, and axillary nodes normal Neurologic: Grossly normal, cranial nerves II through XII intact, alert and oriented x 3 Psych: Appropriate affect   LABORATORY DATA: Lab Results  Component Value Date   WBC 6.8 03/27/2013   HGB 12.0 03/27/2013   HCT 38.1 03/27/2013   MCV 81.4 03/27/2013   PLT 249 03/27/2013      Chemistry      Component Value Date/Time   NA 142 03/27/2013 1021   NA 140  06/01/2011 1305   K 3.9 03/27/2013 1021   K 4.0 06/01/2011 1305   CL 108* 09/19/2012 1156   CL 103 06/01/2011 1305   CO2 25 03/27/2013 1021   CO2 28 06/01/2011 1305   BUN 15.9 03/27/2013 1021   BUN 15 06/01/2011 1305   CREATININE 1.0 03/27/2013 1021   CREATININE 1.01 06/01/2011 1305      Component Value Date/Time   CALCIUM 9.7 03/27/2013 1021   CALCIUM 10.0 06/01/2011 1305   ALKPHOS 98 03/27/2013 1021   ALKPHOS 97 06/01/2011 1305   AST 17 03/27/2013 1021   AST 17 06/01/2011 1305   ALT 18 03/27/2013 1021   ALT 19 06/01/2011 1305   BILITOT 0.27 03/27/2013 1021   BILITOT 0.2* 06/01/2011 1305       RADIOGRAPHIC STUDIES: 1.  Mm Digital Screening Unilat L 07/30/2012   *RADIOLOGY REPORT*  Clinical Data:  Screening.  LEFT DIGITAL SCREENING MAMMOGRAM WITH CAD  Comparison: Prior studies  FINDINGS:  ACR Breast Density Category: 3: The breast tissue is  heterogeneously dense.  The patient has had a right mastectomy.  No suspicious masses, architectural distortion, or calcifications are present.  Images were processed with CAD.  IMPRESSION: No evidence of malignancy.  Screening mammography is recommended in one year.  RECOMMENDATION: Screening mammogram in one year. (Code:SM-B-01Y)  BI-RADS CATEGORY 1:  Negative.   Original Report Authenticated By: Rolla Plate, M.D.   2.  Bone density scan on 07/30/2012 showed a T score of -1.6 (osteopenia).    ASSESSMENT: Diane Paul is a 72 y.o. woman: #1 Right breast needle core biopsy with right axillary lymph node biopsy on 05/18/2010 which showed invasive and in situ mammary carcinoma in the right breast pathology, estrogen receptor 98% positive, progesterone receptor 97% positive, Ki-67 36%, HER-2/neu by CISH no amplification.  Right axillary lymph node biopsy pathology showed no evidence of carcinoma in one lymph node.  #2 Bilateral breast MRI on 05/24/2010 showed within the lateral midportion of the right breast, there was a spiculated enhancing mass which measured 5.4 x 4.6 x 4.4 cm.  Findings were consistent with biopsy invasive ductal carcinoma/ductal carcinoma in situ.  Surrounding the biopsy, there was tissue edema.  There was edema in the right axilla, possibly related to biopsy.  No suspicious axillary lymph nodes were identified.  There was lateral deviation of the right nipple.  No other suspicious enhancement on the right.  Images of the left breast were unremarkable.  #3 Status post right breast simple mastectomy with right axillary sentinel lymph node biopsy on 06/11/2011 for a stage I, ypT1c, ypN0, 1.3 cm invasive lobular carcinoma, grade 1, with atypical lobular hyperplasia and focal lymphovascular invasion identified, surgical margins were negative for carcinoma, estrogen receptor 91% positive, progesterone receptor 25% positive, Ki-67 not provided in pathology report, HER-2/neu by CISH no  amplification, with 0/8 metastatic right axillary lymph nodes.  #4 Patient was seen by Dr. Michell Heinrich.  She did not require adjuvant radiation therapy.  #5 Continued antiestrogen therapy with Letrozole 2.5 mg by mouth daily adjuvantly.  A total of 7 years of antiestrogen therapy as planned.  #6 Osteopenia   PLAN:  #1  Ms. Toni Arthurs with continued antiestrogen therapy with Letrozole 2.5 mg by mouth daily.  An electronic prescription for Letrozole 2.5 mg by mouth daily #90 with 5 refills was sent to the patient's pharmacy.  #2 We will order the patient's unilateral left digital screening mammogram for her due in 07/2013.  #3 Ms. Freundlich was  given written information on receiving Prolia (Denosumab) injections for her osteopenia.  Possible osteonecrosis of the jaw was explained to her in detail by Dr. Welton Flakes.  Ms. Gariepy will think about receiving Prolia injections and inform us of her decision.  In the meantime she was asked to continue taking calcium and vitamin D3 daily.  She was also asked to exercise daily (walking as tolerated).  #4 We plan to see Ms. Gibb again in 6 months at which time we will check laboratories of CBC and CMP.  All questions answered.  Ms. Sowell and her daughter [name] were encouraged to contact us in the interim with any questions, concerns, or problems.  03/29/2013, 6:14 PM  ATTENDING'S ATTESTATION:  I personally reviewed patient's chart, examined patient myself, formulated the treatment plan as followed.    Patient is a 72 year old female with history of stage II invasive lobular carcinoma of the right breast that was ER positive originally diagnosed in January 2012. She had a mastectomy. Tumor was ER positive therefore she was started on letrozole 2.5 mg. A total of 7 years of therapy is planned.  Drue Second, MD Medical/Oncology [facility] [phone] (beeper) [phone] (Office)  04/08/2013, 9:59 AM

## 2013-04-13 ENCOUNTER — Other Ambulatory Visit: Payer: Self-pay | Admitting: Emergency Medicine

## 2013-04-13 DIAGNOSIS — C50911 Malignant neoplasm of unspecified site of right female breast: Secondary | ICD-10-CM

## 2013-04-13 MED ORDER — LETROZOLE 2.5 MG PO TABS: 2.5000 mg | ORAL_TABLET | Freq: Every day | ORAL | Status: DC

## 2013-04-21 ENCOUNTER — Other Ambulatory Visit: Payer: Self-pay | Admitting: Family Medicine

## 2013-07-31 ENCOUNTER — Other Ambulatory Visit: Payer: Self-pay

## 2013-07-31 ENCOUNTER — Ambulatory Visit
Admission: RE | Admit: 2013-07-31 | Discharge: 2013-07-31 | Disposition: A | Discharge status: Home / Self Care | Payer: Medicare Other | Source: Ambulatory Visit

## 2013-07-31 ENCOUNTER — Ambulatory Visit
Admission: RE | Admit: 2013-07-31 | Discharge: 2013-07-31 | Disposition: A | Discharge status: Home / Self Care | Payer: Medicare Other | Source: Ambulatory Visit | Attending: Family | Admitting: Family

## 2013-07-31 ENCOUNTER — Other Ambulatory Visit: Payer: Self-pay | Admitting: Family

## 2013-07-31 DIAGNOSIS — Z853 Personal history of malignant neoplasm of breast: Secondary | ICD-10-CM

## 2013-07-31 DIAGNOSIS — Z1231 Encounter for screening mammogram for malignant neoplasm of breast: Secondary | ICD-10-CM | POA: Diagnosis not present

## 2013-07-31 DIAGNOSIS — C50911 Malignant neoplasm of unspecified site of right female breast: Secondary | ICD-10-CM

## 2013-09-22 ENCOUNTER — Telehealth: Payer: Self-pay | Admitting: Adult Health

## 2013-09-22 NOTE — Telephone Encounter (Signed)
, °

## 2013-09-27 ENCOUNTER — Other Ambulatory Visit: Payer: Self-pay | Admitting: Family Medicine

## 2013-10-09 ENCOUNTER — Ambulatory Visit: Payer: Medicare Other | Admitting: Oncology

## 2013-10-09 ENCOUNTER — Other Ambulatory Visit: Payer: Medicare Other

## 2013-11-20 ENCOUNTER — Ambulatory Visit (HOSPITAL_BASED_OUTPATIENT_CLINIC_OR_DEPARTMENT_OTHER): Payer: Medicare Other | Admitting: Adult Health

## 2013-11-20 ENCOUNTER — Encounter: Payer: Self-pay | Admitting: Adult Health

## 2013-11-20 ENCOUNTER — Other Ambulatory Visit (HOSPITAL_BASED_OUTPATIENT_CLINIC_OR_DEPARTMENT_OTHER): Payer: Medicare Other

## 2013-11-20 ENCOUNTER — Telehealth: Payer: Self-pay | Admitting: Adult Health

## 2013-11-20 ENCOUNTER — Telehealth: Payer: Self-pay | Admitting: *Deleted

## 2013-11-20 ENCOUNTER — Other Ambulatory Visit: Payer: Self-pay | Admitting: *Deleted

## 2013-11-20 VITALS — BP 137/76 | HR 89 | Temp 97.7°F | Resp 20 | Ht 67.0 in | Wt 266.7 lb

## 2013-11-20 DIAGNOSIS — C50919 Malignant neoplasm of unspecified site of unspecified female breast: Secondary | ICD-10-CM | POA: Diagnosis not present

## 2013-11-20 DIAGNOSIS — M8580 Other specified disorders of bone density and structure, unspecified site: Secondary | ICD-10-CM

## 2013-11-20 DIAGNOSIS — C50911 Malignant neoplasm of unspecified site of right female breast: Secondary | ICD-10-CM

## 2013-11-20 DIAGNOSIS — E559 Vitamin D deficiency, unspecified: Secondary | ICD-10-CM

## 2013-11-20 DIAGNOSIS — M899 Disorder of bone, unspecified: Secondary | ICD-10-CM | POA: Diagnosis not present

## 2013-11-20 DIAGNOSIS — M949 Disorder of cartilage, unspecified: Secondary | ICD-10-CM | POA: Diagnosis not present

## 2013-11-20 LAB — CBC WITH DIFFERENTIAL/PLATELET
BASO%: 0.8 % (ref 0.0–2.0)
Basophils Absolute: 0.1 10*3/uL (ref 0.0–0.1)
EOS%: 2.9 % (ref 0.0–7.0)
Eosinophils Absolute: 0.2 10*3/uL (ref 0.0–0.5)
HCT: 38.6 % (ref 34.8–46.6)
HGB: 12.1 g/dL (ref 11.6–15.9)
LYMPH%: 30 % (ref 14.0–49.7)
MCH: 25.5 pg (ref 25.1–34.0)
MCHC: 31.4 g/dL — ABNORMAL LOW (ref 31.5–36.0)
MCV: 81.1 fL (ref 79.5–101.0)
MONO#: 0.9 10*3/uL (ref 0.1–0.9)
MONO%: 13.4 % (ref 0.0–14.0)
NEUT#: 3.5 10*3/uL (ref 1.5–6.5)
NEUT%: 52.9 % (ref 38.4–76.8)
Platelets: 240 10*3/uL (ref 145–400)
RBC: 4.75 10*6/uL (ref 3.70–5.45)
RDW: 14.9 % — ABNORMAL HIGH (ref 11.2–14.5)
WBC: 6.7 10*3/uL (ref 3.9–10.3)
lymph#: 2 10*3/uL (ref 0.9–3.3)

## 2013-11-20 LAB — COMPREHENSIVE METABOLIC PANEL (CC13)
ALT: 26 U/L (ref 0–55)
ANION GAP: 9 meq/L (ref 3–11)
AST: 18 U/L (ref 5–34)
Albumin: 3.7 g/dL (ref 3.5–5.0)
Alkaline Phosphatase: 95 U/L (ref 40–150)
BILIRUBIN TOTAL: 0.24 mg/dL (ref 0.20–1.20)
BUN: 14.1 mg/dL (ref 7.0–26.0)
CALCIUM: 9.3 mg/dL (ref 8.4–10.4)
CO2: 27 meq/L (ref 22–29)
CREATININE: 1.1 mg/dL (ref 0.6–1.1)
Chloride: 107 mEq/L (ref 98–109)
Glucose: 96 mg/dl (ref 70–140)
Potassium: 4 mEq/L (ref 3.5–5.1)
Sodium: 143 mEq/L (ref 136–145)
Total Protein: 7.6 g/dL (ref 6.4–8.3)

## 2013-11-20 LAB — VITAMIN D 25 HYDROXY (VIT D DEFICIENCY, FRACTURES): Vit D, 25-Hydroxy: 54 ng/mL (ref 30–89)

## 2013-11-20 MED ORDER — LETROZOLE 2.5 MG PO TABS: 2.5000 mg | ORAL_TABLET | Freq: Every day | ORAL | Status: DC

## 2013-11-20 NOTE — Telephone Encounter (Signed)
Message copied by Harmon Pier on Fri Nov 20, 2013  5:08 PM ------      Message from: Minette Headland      Created: Fri Nov 20, 2013  4:02 PM      Regarding: NORMAL LABS       Please call the patient.  Tell her that her labs are normal.             Thanks,       LC      ----- Message -----         From: [facility] Interface         Sent: 11/20/2013   9:25 AM           To: Minette Headland, NP                   ------

## 2013-11-20 NOTE — Telephone Encounter (Signed)
per pof to sch appt-gave pt copy of sch °

## 2013-11-20 NOTE — Patient Instructions (Signed)
You are doing well.  You have no sign of recurrence.  Continue taking Letrozole every day.  I will work on [employer] to approve Prolia.  Follow up with your primary care physician for a WELL VISIT for them to check routine labs such as your cholesterol, blood sugar, and follow your blood pressure.  Let me know if you decide that you want to be referred to a gastroenterologist (stomach doctor) to discuss colonoscopy screening.  I recommend a healthy diet, exercise and monthly self breast exams.    Breast Self-Awareness Practicing breast self-awareness may pick up problems early, prevent significant medical complications, and possibly save your life. By practicing breast self-awareness, you can become familiar with how your breasts look and feel and if your breasts are changing. This allows you to notice changes early. It can also offer you some reassurance that your breast health is good. One way to learn what is normal for your breasts and whether your breasts are changing is to do a breast self-exam. If you find a lump or something that was not present in the past, it is best to contact your caregiver right away. Other findings that should be evaluated by your caregiver include nipple discharge, especially if it is bloody; skin changes or reddening; areas where the skin seems to be pulled in (retracted); or new lumps and bumps. Breast pain is seldom associated with cancer (malignancy), but should also be evaluated by a caregiver. HOW TO PERFORM A BREAST SELF-EXAM The best time to examine your breasts is 5-7 days after your menstrual period is over. During menstruation, the breasts are lumpier, and it may be more difficult to pick up changes. If you do not menstruate, have reached menopause, or had your uterus removed (hysterectomy), you should examine your breasts at regular intervals, such as monthly. If you are breastfeeding, examine your breasts after a feeding or after using a breast pump. Breast  implants do not decrease the risk for lumps or tumors, so continue to perform breast self-exams as recommended. Talk to your caregiver about how to determine the difference between the implant and breast tissue. Also, talk about the amount of pressure you should use during the exam. Over time, you will become more familiar with the variations of your breasts and more comfortable with the exam. A breast self-exam requires you to remove all your clothes above the waist. 1. Look at your breasts and nipples. Stand in front of a mirror in a room with good lighting. With your hands on your hips, push your hands firmly downward. Look for a difference in shape, contour, and size from one breast to the other (asymmetry). Asymmetry includes puckers, dips, or bumps. Also, look for skin changes, such as reddened or scaly areas on the breasts. Look for nipple changes, such as discharge, dimpling, repositioning, or redness. 2. Carefully feel your breasts. This is best done either in the shower or tub while using soapy water or when flat on your back. Place the arm (on the side of the breast you are examining) above your head. Use the pads (not the fingertips) of your three middle fingers on your opposite hand to feel your breasts. Start in the underarm area and use  inch (2 cm) overlapping circles to feel your breast. Use 3 different levels of pressure (light, medium, and firm pressure) at each circle before moving to the next circle. The light pressure is needed to feel the tissue closest to the skin. The medium pressure will  help to feel breast tissue a little deeper, while the firm pressure is needed to feel the tissue close to the ribs. Continue the overlapping circles, moving downward over the breast until you feel your ribs below your breast. Then, move one finger-width towards the center of the body. Continue to use the  inch (2 cm) overlapping circles to feel your breast as you move slowly up toward the collar bone  (clavicle) near the base of the neck. Continue the up and down exam using all 3 pressures until you reach the middle of the chest. Do this with each breast, carefully feeling for lumps or changes. 3.  Keep a written record with breast changes or normal findings for each breast. By writing this information down, you do not need to depend only on memory for size, tenderness, or location. Write down where you are in your menstrual cycle, if you are still menstruating. Breast tissue can have some lumps or thick tissue. However, see your caregiver if you find anything that concerns you.  SEEK MEDICAL CARE IF:  You see a change in shape, contour, or size of your breasts or nipples.   You see skin changes, such as reddened or scaly areas on the breasts or nipples.   You have an unusual discharge from your nipples.   You feel a new lump or unusually thick areas.  Document Released: 04/23/2005 Document Revised: 04/09/2012 Document Reviewed: 08/08/2011 [facility] Patient Information 2015 [place], [place]. This information is not intended to replace advice given to you by your health care provider. Make sure you discuss any questions you have with your health care provider.     Colonoscopy A colonoscopy is an exam to look at the entire large intestine (colon). This exam can help find problems such as tumors, polyps, inflammation, and areas of bleeding. The exam takes about 1 hour.  LET [facility] CARE PROVIDER KNOW ABOUT:   Any allergies you have.  All medicines you are taking, including vitamins, herbs, eye drops, creams, and over-the-counter medicines.  Previous problems you or members of your family have had with the use of anesthetics.  Any blood disorders you have.  Previous surgeries you have had.  Medical conditions you have. RISKS AND COMPLICATIONS  Generally, this is a safe procedure. However, as with any procedure, complications can occur. Possible complications  include:  Bleeding.  Tearing or rupture of the colon wall.  Reaction to medicines given during the exam.  Infection (rare). BEFORE THE PROCEDURE   Ask your health care provider about changing or stopping your regular medicines.  You may be prescribed an oral bowel prep. This involves drinking a large amount of medicated liquid, starting the day before your procedure. The liquid will cause you to have multiple loose stools until your stool is almost clear or light green. This cleans out your colon in preparation for the procedure.  Do not eat or drink anything else once you have started the bowel prep, unless your health care provider tells you it is safe to do so.  Arrange for someone to drive you home after the procedure. PROCEDURE   You will be given medicine to help you relax (sedative).  You will lie on your side with your knees bent.  A long, flexible tube with a light and camera on the end (colonoscope) will be inserted through the rectum and into the colon. The camera sends video back to a computer screen as it moves through the colon. The colonoscope also releases  carbon dioxide gas to inflate the colon. This helps your health care provider see the area better.  During the exam, your health care provider may take a small tissue sample (biopsy) to be examined under a microscope if any abnormalities are found.  The exam is finished when the entire colon has been viewed. AFTER THE PROCEDURE   Do not drive for 24 hours after the exam.  You may have a small amount of blood in your stool.  You may pass moderate amounts of gas and have mild abdominal cramping or bloating. This is caused by the gas used to inflate your colon during the exam.  Ask when your test results will be ready and how you will get your results. Make sure you get your test results. Document Released: 04/20/2000 Document Revised: 02/11/2013 Document Reviewed: 12/29/2012 [facility] Patient Information 2015  [place], [place]. This information is not intended to replace advice given to you by your health care provider. Make sure you discuss any questions you have with your health care provider. Denosumab injection What is this medicine? DENOSUMAB (den oh sue mab) slows bone breakdown. Prolia is used to treat osteoporosis in women after menopause and in men. Delton See is used to prevent bone fractures and other bone problems caused by cancer bone metastases. Delton See is also used to treat giant cell tumor of the bone. This medicine may be used for other purposes; ask your health care provider or pharmacist if you have questions. COMMON BRAND NAME(S): Prolia, XGEVA What should I tell my health care provider before I take this medicine? They need to know if you have any of these conditions: -dental disease -eczema -infection or history of infections -kidney disease or on dialysis -low blood calcium or vitamin D -malabsorption syndrome -scheduled to have surgery or tooth extraction -taking medicine that contains denosumab -thyroid or parathyroid disease -an unusual reaction to denosumab, other medicines, foods, dyes, or preservatives -pregnant or trying to get pregnant -breast-feeding How should I use this medicine? This medicine is for injection under the skin. It is given by a health care professional in a hospital or clinic setting. If you are getting Prolia, a special MedGuide will be given to you by the pharmacist with each prescription and refill. Be sure to read this information carefully each time. For Prolia, talk to your pediatrician regarding the use of this medicine in children. Special care may be needed. For Delton See, talk to your pediatrician regarding the use of this medicine in children. While this drug may be prescribed for children as young as 13 years for selected conditions, precautions do apply. Overdosage: If you think you've taken too much of this medicine contact a poison control center or  emergency room at once. Overdosage: If you think you have taken too much of this medicine contact a poison control center or emergency room at once. NOTE: This medicine is only for you. Do not share this medicine with others. What if I miss a dose? It is important not to miss your dose. Call your doctor or health care professional if you are unable to keep an appointment. What may interact with this medicine? Do not take this medicine with any of the following medications: -other medicines containing denosumab This medicine may also interact with the following medications: -medicines that suppress the immune system -medicines that treat cancer -steroid medicines like prednisone or cortisone This list may not describe all possible interactions. Give your health care provider a list of all the medicines, herbs, non-prescription  drugs, or dietary supplements you use. Also tell them if you smoke, drink alcohol, or use illegal drugs. Some items may interact with your medicine. What should I watch for while using this medicine? Visit your doctor or health care professional for regular checks on your progress. Your doctor or health care professional may order blood tests and other tests to see how you are doing. Call your doctor or health care professional if you get a cold or other infection while receiving this medicine. Do not treat yourself. This medicine may decrease your body's ability to fight infection. You should make sure you get enough calcium and vitamin D while you are taking this medicine, unless your doctor tells you not to. Discuss the foods you eat and the vitamins you take with your health care professional. See your dentist regularly. Brush and floss your teeth as directed. Before you have any dental work done, tell your dentist you are receiving this medicine. Do not become pregnant while taking this medicine or for 5 months after stopping it. Women should inform their doctor if they  wish to become pregnant or think they might be pregnant. There is a potential for serious side effects to an unborn child. Talk to your health care professional or pharmacist for more information. What side effects may I notice from receiving this medicine? Side effects that you should report to your doctor or health care professional as soon as possible: -allergic reactions like skin rash, itching or hives, swelling of the face, lips, or tongue -breathing problems -chest pain -fast, irregular heartbeat -feeling faint or lightheaded, falls -fever, chills, or any other sign of infection -muscle spasms, tightening, or twitches -numbness or tingling -skin blisters or bumps, or is dry, peels, or red -slow healing or unexplained pain in the mouth or jaw -unusual bleeding or bruising Side effects that usually do not require medical attention (Report these to your doctor or health care professional if they continue or are bothersome.): -muscle pain -stomach upset, gas This list may not describe all possible side effects. Call your doctor for medical advice about side effects. You may report side effects to FDA at 1-800-FDA-1088. Where should I keep my medicine? This medicine is only given in a clinic, doctor's office, or other health care setting and will not be stored at home. NOTE: This sheet is a summary. It may not cover all possible information. If you have questions about this medicine, talk to your doctor, pharmacist, or health care provider.  2015, [employer]. (2011-10-22 12:37:47)

## 2013-11-20 NOTE — Telephone Encounter (Signed)
Called pt to inform her of lab results. Communicated with pt that ALL LABS were within normal limits. Pt was pleased with results and verbalized understanding. Message to be forwarded to Charlestine Massed, NP.

## 2013-11-20 NOTE — Progress Notes (Signed)
[place]  Telephone:([phone]) [phone] Fax:(336) [phone]  OFFICE PROGRESS NOTE  ID: TYESE FINKEN   DOB: 01/09/1941  MR#: [id number]  NID#:[id number]   PCP: Odette Fraction, MD SU:  Fanny Skates, MD    DIAGNOSIS: Diane Paul is a 73 y.o. female with history of stage II, invasive lobular carcinoma of the right breast, estrogen receptor positive, diagnosed in 05/2010.   PRIOR THERAPY: #1 Right breast needle core biopsy with right axillary lymph node biopsy on 05/18/2010 which showed invasive and in situ mammary carcinoma in the right breast pathology, estrogen receptor 98% positive, progesterone receptor 97% positive, Ki-67 36%, HER-2/neu by CISH no amplification.  Right axillary lymph node biopsy pathology showed no evidence of carcinoma in one lymph node.  #2 Bilateral breast MRI on 05/24/2010 showed within the lateral midportion of the right breast, there was a spiculated enhancing mass which measured 5.4 x 4.6 x 4.4 cm.  Findings were consistent with biopsy invasive ductal carcinoma/ductal carcinoma in situ.  Surrounding the biopsy, there was tissue edema.  There was edema in the right axilla, possibly related to biopsy.  No suspicious axillary lymph nodes were identified.  There was lateral deviation of the right nipple.  No other suspicious enhancement on the right.  Images of the left breast were unremarkable.  #3 Status post right breast simple mastectomy with right axillary sentinel lymph node biopsy on 06/11/2011 for a stage I, ypT1c, ypN0, 1.3 cm invasive lobular carcinoma, grade 1, with atypical lobular hyperplasia and focal lymphovascular invasion identified, surgical margins were negative for carcinoma, estrogen receptor 91% positive, progesterone receptor 25% positive, Ki-67 not provided in pathology report, HER-2/neu by CISH no amplification, with 0/8 metastatic right axillary lymph nodes.  #4 Patient was seen by Dr. Pablo Ledger.  She did not require adjuvant  radiation therapy.  #5 Continued antiestrogen therapy with Letrozole 2.5 mg by mouth daily adjuvantly.   CURRENT THERAPY:  Letrozole 2.5 mg by mouth daily.   A total of 7 years of antiestrogen therapy as planned.   INTERVAL HISTORY: Diane Paul is a 73 y.o. female who returns for followup of her h/o right breast invasive lobular carcinoma.  She is doing moderately well today.  She is taking Letrozole daily and is tolerating it moderately well.  She does have tolerable hot flashes every day, but denies joint aches, vaginal dryness or dry eyes.  She was previously on Fosamax for her bone density, and was informed by our office that she would be put on Prolia, however she wants to know why this wasn't arranged.  Otherwise, she denies fevers, chills, nasuea, vomiting, constipation, diarrhea, bowel/bladder changes, cough, shortness of breath, new pain, unintentional weight loss, or any further concerns.  She gardens in her spare time and is extremely active with her friends in the community.  We reviewed her health maintenance below.   MEDICAL HISTORY: Past Medical History  Diagnosis Date  . Anemia   . Blood transfusion     post vaginal birth- 65   . Shortness of breath     sometimes   . Dry mouth   . Cancer     R breast cancer  . Arthritis     knees ?  Marland Kitchen Use of letrozole (Femara) 05/2010    neoadjuvant femara therapy since 1/212  . Anxiety     nervous sometimes, denies panica ttacks   . Hypertension   . Neuromuscular disorder     L leg, thigh- "burns sometimes"  .  Breast cancer     Right Breast Cancer    ALLERGIES: Allergies  Allergen Reactions  . Fosamax [Alendronate Sodium] Other (See Comments)    Aches and pains      MEDICATIONS:  Current Outpatient Prescriptions  Medication Sig Dispense Refill  . amLODipine (NORVASC) 5 MG tablet TAKE 1 TABLET BY MOUTH EVERY DAY  30 tablet  4  . B Complex-C-Folic Acid (MULTIVITAMIN, STRESS FORMULA) tablet Take 1 tablet by mouth  daily. gummie bear MVT      . polyethylene glycol (MIRALAX / GLYCOLAX) packet Take 17 g by mouth daily as needed.      . Calcium Carbonate-Vit D-Min (CALTRATE PLUS PO) Take by mouth. $RemoveB'1200MG'AzqlBdcD$  OF CALCIUM & 800IU OF VITAMIN D       . letrozole (FEMARA) 2.5 MG tablet Take 1 tablet (2.5 mg total) by mouth daily.  90 tablet  4   No current facility-administered medications for this visit.    SURGICAL HISTORY:  Past Surgical History  Procedure Laterality Date  . Abdominal hysterectomy    . Tubal ligation    . Breast surgery      Right  . Mastectomy w/ sentinel node biopsy  06/11/2011/Right BReast    Procedure: MASTECTOMY WITH SENTINEL LYMPH NODE BIOPSY;  Surgeon: Adin Hector, MD;  Location: [facility];  Service: General;  Laterality: Right;  Right total mastectomy and sentinel lymph node biopsy using lymphatic mapping and blue dye injection.  . Jp drains      s/p masectomy 06/11/11/ Dr. Fanny Skates, 2 jp drains right chest wall  . [place] fine needle aspiration wo/ w smear  05/22/10    Left Breast- cyst or abscess     REVIEW OF SYSTEMS:  A 10 point review of systems was conducted and is otherwise negative except for what is noted above.    Health Maintenance  Mammogram: 07/31/13, normal Colonoscopy: never had one, declines referral to GI Bone Density Scan:  07/30/12 Pap Smear: s/p tah/unilateral oopherectomy Eye Exam: 08/2013 Vitamin D Level: due today Lipid Panel:  Quite some time   PHYSICAL EXAMINATION: Blood pressure 137/76, pulse 89, temperature 97.7 F (36.5 C), temperature source Oral, resp. rate 20, height $RemoveBe'5\' 7"'yauApypMG$  (1.702 m), weight 266 lb 11.2 oz (120.974 kg). Body mass index is 41.76 kg/(m^2).  ECOG PERFORMANCE STATUS: 1 - Symptomatic but completely ambulatory  GENERAL: Patient is a well appearing older obese female in no acute distress HEENT:  Sclerae anicteric.  Oropharynx clear and moist. No ulcerations or evidence of oropharyngeal candidiasis. Neck is supple.  NODES:  No cervical,  supraclavicular, or axillary lymphadenopathy palpated.  BREAST EXAM: Right breast is surgically absent, right chest wall area has well-healed surgical scars, left breast is pendulous with glandular tissue, no left breast nipple inversion, bilateral axillary fullness LUNGS:  Clear to auscultation bilaterally.  No wheezes or rhonchi. HEART:  Regular rate and rhythm. No murmur appreciated. ABDOMEN:  Soft, nontender.  Positive, normoactive bowel sounds. No organomegaly palpated. MSK:  No focal spinal tenderness to palpation. Full range of motion bilaterally in the upper extremities. EXTREMITIES:  3+bilateral pedal edema  SKIN:  Clear with no obvious rashes or skin changes. No nail dyscrasia. NEURO:  Nonfocal. Well oriented.  Appropriate affect.   LABORATORY DATA: Lab Results  Component Value Date   WBC 6.7 11/20/2013   HGB 12.1 11/20/2013   HCT 38.6 11/20/2013   MCV 81.1 11/20/2013   PLT 240 11/20/2013      Chemistry  Component Value Date/Time   NA 143 11/20/2013 0918   NA 140 06/01/2011 1305   K 4.0 11/20/2013 0918   K 4.0 06/01/2011 1305   CL 108* 09/19/2012 1156   CL 103 06/01/2011 1305   CO2 27 11/20/2013 0918   CO2 28 06/01/2011 1305   BUN 14.1 11/20/2013 0918   BUN 15 06/01/2011 1305   CREATININE 1.1 11/20/2013 0918   CREATININE 1.01 06/01/2011 1305      Component Value Date/Time   CALCIUM 9.3 11/20/2013 0918   CALCIUM 10.0 06/01/2011 1305   ALKPHOS 95 11/20/2013 0918   ALKPHOS 97 06/01/2011 1305   AST 18 11/20/2013 0918   AST 17 06/01/2011 1305   ALT 26 11/20/2013 0918   ALT 19 06/01/2011 1305   BILITOT 0.24 11/20/2013 0918   BILITOT 0.2* 06/01/2011 1305       RADIOGRAPHIC STUDIES: 1.  Mm Digital Screening Unilat L 07/30/2012   *RADIOLOGY REPORT*  Clinical Data:  Screening.  LEFT DIGITAL SCREENING MAMMOGRAM WITH CAD  Comparison: Prior studies  FINDINGS:  ACR Breast Density Category: 3: The breast tissue is heterogeneously dense.  The patient has had a right mastectomy.  No suspicious  masses, architectural distortion, or calcifications are present.  Images were processed with CAD.  IMPRESSION: No evidence of malignancy.  Screening mammography is recommended in one year.  RECOMMENDATION: Screening mammogram in one year. (Code:SM-B-01Y)  BI-RADS CATEGORY 1:  Negative.   Original Report Authenticated By: Rolla Plate, M.D.   2.  Bone density scan on 07/30/2012 showed a T score of -1.6 (osteopenia).    ASSESSMENT: Diane Paul is a 73 y.o. woman: #1 Right breast needle core biopsy with right axillary lymph node biopsy on 05/18/2010 which showed invasive and in situ mammary carcinoma in the right breast pathology, estrogen receptor 98% positive, progesterone receptor 97% positive, Ki-67 36%, HER-2/neu by CISH no amplification.  Right axillary lymph node biopsy pathology showed no evidence of carcinoma in one lymph node.  #2 Bilateral breast MRI on 05/24/2010 showed within the lateral midportion of the right breast, there was a spiculated enhancing mass which measured 5.4 x 4.6 x 4.4 cm.  Findings were consistent with biopsy invasive ductal carcinoma/ductal carcinoma in situ.  Surrounding the biopsy, there was tissue edema.  There was edema in the right axilla, possibly related to biopsy.  No suspicious axillary lymph nodes were identified.  There was lateral deviation of the right nipple.  No other suspicious enhancement on the right.  Images of the left breast were unremarkable.  #3 Status post right breast simple mastectomy with right axillary sentinel lymph node biopsy on 06/11/2011 for a stage I, ypT1c, ypN0, 1.3 cm invasive lobular carcinoma, grade 1, with atypical lobular hyperplasia and focal lymphovascular invasion identified, surgical margins were negative for carcinoma, estrogen receptor 91% positive, progesterone receptor 25% positive, Ki-67 not provided in pathology report, HER-2/neu by CISH no amplification, with 0/8 metastatic right axillary lymph nodes.  #4 Patient  was seen by Dr. Michell Heinrich.  She did not require adjuvant radiation therapy.  #5 Continued antiestrogen therapy with Letrozole 2.5 mg by mouth daily adjuvantly.  A total of 7 years of antiestrogen therapy as planned.  #6 Osteopenia: patient will start Prolia   PLAN:   Lurae is doing well today.  She has no sign of recurrence and will continue taking Letrozole daily.  I reviewed her CBC with her in detail, it is normal, a CMP is pending.    I reviewed her  health maintenance with her daughter and her.  I reviewed with the patient and her daughter the need for seeing a gastroenterologist and discussing a colonoscopy.  She will consider this and contact me if she would like a referral.  She also does not see her PCP regularly, and I recommended she have someone follow her cholesterol, blood pressure, and immunizations.  I recommended she continue a healthy diet, exercise and monthly breast exams.  She had difficulty with tolerating Fosamax and is nervous about receiving Prolia.  I have requested prior authorization of this and gave her detailed information about it in her AVS for her to review.  Should she have any further questions about bisphosphanate therapy I requested that she contact us prior to her Prolia appointment and that I would be happy to discuss this with her further.    Tenee will return in one week for Prolia and in 6 months for labs, evaluation and Prolia.    I spent 25 minutes counseling the patient face to face.  The total time spent in the appointment was 30 minutes.  Minette Headland, [place] [facility] [place] [phone] 11/22/2013, 8:42 AM

## 2013-11-27 ENCOUNTER — Ambulatory Visit (HOSPITAL_BASED_OUTPATIENT_CLINIC_OR_DEPARTMENT_OTHER): Payer: Medicare Other

## 2013-11-27 VITALS — BP 129/66 | HR 97 | Temp 98.1°F

## 2013-11-27 DIAGNOSIS — M949 Disorder of cartilage, unspecified: Secondary | ICD-10-CM

## 2013-11-27 DIAGNOSIS — M899 Disorder of bone, unspecified: Secondary | ICD-10-CM | POA: Diagnosis not present

## 2013-11-27 DIAGNOSIS — M8580 Other specified disorders of bone density and structure, unspecified site: Secondary | ICD-10-CM

## 2013-11-27 MED ORDER — DENOSUMAB 60 MG/ML ~~LOC~~ SOLN
60.0000 mg | Freq: Once | SUBCUTANEOUS | Status: AC
  Administered 2013-11-27: 60 mg via SUBCUTANEOUS
  Filled 2013-11-27: qty 1

## 2013-11-27 NOTE — Patient Instructions (Signed)
Denosumab injection What is this medicine? DENOSUMAB (den oh sue mab) slows bone breakdown. Prolia is used to treat osteoporosis in women after menopause and in men. Xgeva is used to prevent bone fractures and other bone problems caused by cancer bone metastases. Xgeva is also used to treat giant cell tumor of the bone. This medicine may be used for other purposes; ask your health care provider or pharmacist if you have questions. COMMON BRAND NAME(S): Prolia, XGEVA What should I tell my health care provider before I take this medicine? They need to know if you have any of these conditions: -dental disease -eczema -infection or history of infections -kidney disease or on dialysis -low blood calcium or vitamin D -malabsorption syndrome -scheduled to have surgery or tooth extraction -taking medicine that contains denosumab -thyroid or parathyroid disease -an unusual reaction to denosumab, other medicines, foods, dyes, or preservatives -pregnant or trying to get pregnant -breast-feeding How should I use this medicine? This medicine is for injection under the skin. It is given by a health care professional in a hospital or clinic setting. If you are getting Prolia, a special MedGuide will be given to you by the pharmacist with each prescription and refill. Be sure to read this information carefully each time. For Prolia, talk to your pediatrician regarding the use of this medicine in children. Special care may be needed. For Xgeva, talk to your pediatrician regarding the use of this medicine in children. While this drug may be prescribed for children as young as 13 years for selected conditions, precautions do apply. Overdosage: If you think you've taken too much of this medicine contact a poison control center or emergency room at once. Overdosage: If you think you have taken too much of this medicine contact a poison control center or emergency room at once. NOTE: This medicine is only for  you. Do not share this medicine with others. What if I miss a dose? It is important not to miss your dose. Call your doctor or health care professional if you are unable to keep an appointment. What may interact with this medicine? Do not take this medicine with any of the following medications: -other medicines containing denosumab This medicine may also interact with the following medications: -medicines that suppress the immune system -medicines that treat cancer -steroid medicines like prednisone or cortisone This list may not describe all possible interactions. Give your health care provider a list of all the medicines, herbs, non-prescription drugs, or dietary supplements you use. Also tell them if you smoke, drink alcohol, or use illegal drugs. Some items may interact with your medicine. What should I watch for while using this medicine? Visit your doctor or health care professional for regular checks on your progress. Your doctor or health care professional may order blood tests and other tests to see how you are doing. Call your doctor or health care professional if you get a cold or other infection while receiving this medicine. Do not treat yourself. This medicine may decrease your body's ability to fight infection. You should make sure you get enough calcium and vitamin D while you are taking this medicine, unless your doctor tells you not to. Discuss the foods you eat and the vitamins you take with your health care professional. See your dentist regularly. Brush and floss your teeth as directed. Before you have any dental work done, tell your dentist you are receiving this medicine. Do not become pregnant while taking this medicine or for 5 months after stopping   it. Women should inform their doctor if they wish to become pregnant or think they might be pregnant. There is a potential for serious side effects to an unborn child. Talk to your health care professional or pharmacist for more  information. What side effects may I notice from receiving this medicine? Side effects that you should report to your doctor or health care professional as soon as possible: -allergic reactions like skin rash, itching or hives, swelling of the face, lips, or tongue -breathing problems -chest pain -fast, irregular heartbeat -feeling faint or lightheaded, falls -fever, chills, or any other sign of infection -muscle spasms, tightening, or twitches -numbness or tingling -skin blisters or bumps, or is dry, peels, or red -slow healing or unexplained pain in the mouth or jaw -unusual bleeding or bruising Side effects that usually do not require medical attention (Report these to your doctor or health care professional if they continue or are bothersome.): -muscle pain -stomach upset, gas This list may not describe all possible side effects. Call your doctor for medical advice about side effects. You may report side effects to FDA at 1-800-FDA-1088. Where should I keep my medicine? This medicine is only given in a clinic, doctor's office, or other health care setting and will not be stored at home. NOTE: This sheet is a summary. It may not cover all possible information. If you have questions about this medicine, talk to your doctor, pharmacist, or health care provider.  2015, [employer]vier/[employer]. (2011-10-22 12:37:47)  

## 2013-12-01 ENCOUNTER — Encounter: Payer: Self-pay | Admitting: Family Medicine

## 2013-12-01 ENCOUNTER — Ambulatory Visit (INDEPENDENT_AMBULATORY_CARE_PROVIDER_SITE_OTHER): Payer: Medicare Other | Admitting: Family Medicine

## 2013-12-01 VITALS — BP 130/86 | HR 84 | Temp 97.2°F | Resp 18 | Ht 67.0 in | Wt 265.0 lb

## 2013-12-01 DIAGNOSIS — I10 Essential (primary) hypertension: Secondary | ICD-10-CM

## 2013-12-01 LAB — LIPID PANEL
CHOLESTEROL: 208 mg/dL — AB (ref 0–200)
HDL: 41 mg/dL (ref >39)
LDL Cholesterol: 154 mg/dL — ABNORMAL HIGH (ref 0–99)
Total CHOL/HDL Ratio: 5.1 Ratio
Triglycerides: 66 mg/dL (ref <150)
VLDL: 13 mg/dL (ref 0–40)

## 2013-12-01 NOTE — Progress Notes (Signed)
Subjective:    Patient ID: Diane Paul, female    DOB: Aug 29, 1940, 73 y.o.   MRN: [id number]  HPI  Patient is here today for recheck of her hypertension. She's currently taking amlodipine 5 mg by mouth daily and Lasix 40 mg by mouth daily. Her blood pressure is well-controlled at 130/86. Her recent CBC and CMP were all within normal limits.  She denies any chest pain or shortness of breath or dyspnea on exertion. She does complain of dry mouth. She is overdue for Prevnar 13. She is also overdue for a cholesterol check.  Past Medical History  Diagnosis Date  . Anemia   . Blood transfusion     post vaginal birth- 28   . Shortness of breath     sometimes   . Dry mouth   . Cancer     R breast cancer  . Arthritis     knees ?  Marland Kitchen Use of letrozole (Femara) 05/2010    neoadjuvant femara therapy since 1/212  . Anxiety     nervous sometimes, denies panica ttacks   . Hypertension   . Neuromuscular disorder     L leg, thigh- "burns sometimes"  . Breast cancer     Right Breast Cancer   Current Outpatient Prescriptions on File Prior to Visit  Medication Sig Dispense Refill  . amLODipine (NORVASC) 5 MG tablet TAKE 1 TABLET BY MOUTH EVERY DAY  30 tablet  4  . B Complex-C-Folic Acid (MULTIVITAMIN, STRESS FORMULA) tablet Take 1 tablet by mouth daily. gummie bear MVT      . Calcium Carbonate-Vit D-Min (CALTRATE PLUS PO) Take by mouth. 1200MG  OF CALCIUM & 800IU OF VITAMIN D       . letrozole (FEMARA) 2.5 MG tablet Take 1 tablet (2.5 mg total) by mouth daily.  90 tablet  4  . polyethylene glycol (MIRALAX / GLYCOLAX) packet Take 17 g by mouth daily as needed.       No current facility-administered medications on file prior to visit.   Allergies  Allergen Reactions  . Fosamax [Alendronate Sodium] Other (See Comments)    Aches and pains   History   Social History  . Marital Status: Single    Spouse Name: N/A    Number of Children: 5  . Years of Education: N/A   Occupational History    . Retired    Social History Main Topics  . Smoking status: Never Smoker   . Smokeless tobacco: Never Used  . Alcohol Use: No  . Drug Use: No  . Sexual Activity: Not Currently    Birth Control/ Protection: Post-menopausal   Other Topics Concern  . Not on file   Social History Narrative  . No narrative on file    Review of Systems  All other systems reviewed and are negative.      Objective:   Physical Exam  Vitals reviewed. Constitutional: She appears well-developed and well-nourished. No distress.  HENT:  Head: Normocephalic and atraumatic.  Mouth/Throat: Oropharynx is clear and moist. No oropharyngeal exudate.  Eyes: Conjunctivae are normal. No scleral icterus.  Neck: Neck supple. No JVD present. No thyromegaly present.  Cardiovascular: Normal rate, regular rhythm and normal heart sounds.  Exam reveals no gallop and no friction rub.   No murmur heard. Pulmonary/Chest: Effort normal and breath sounds normal. No respiratory distress. She has no wheezes. She has no rales. She exhibits no tenderness.  Abdominal: Soft. Bowel sounds are normal. She exhibits no distension and  no mass. There is no tenderness. There is no rebound and no guarding.  Musculoskeletal: She exhibits edema.  Lymphadenopathy:    She has no cervical adenopathy.  Skin: She is not diaphoretic.          Assessment & Plan:  1. Essential hypertension Blood pressure is well controlled and the patient's potassium and renal function is stable. I will check a fasting lipid panel. Goal LDL cholesterol is less than 130. Also recommended Prevnar 13 but the patient declined that vaccination today. - Lipid panel

## 2013-12-07 ENCOUNTER — Other Ambulatory Visit: Payer: Self-pay | Admitting: Family Medicine

## 2013-12-07 MED ORDER — ATORVASTATIN CALCIUM 20 MG PO TABS: 20.0000 mg | ORAL_TABLET | Freq: Every day | ORAL | Status: DC

## 2013-12-07 MED ORDER — FUROSEMIDE 40 MG PO TABS: 40.0000 mg | ORAL_TABLET | Freq: Every day | ORAL | Status: DC | PRN

## 2014-03-08 ENCOUNTER — Encounter: Payer: Self-pay | Admitting: Family Medicine

## 2014-03-11 ENCOUNTER — Other Ambulatory Visit: Payer: Self-pay | Admitting: Family Medicine

## 2014-05-27 ENCOUNTER — Ambulatory Visit: Payer: Medicare Other | Admitting: Hematology and Oncology

## 2014-05-27 ENCOUNTER — Telehealth: Payer: Self-pay | Admitting: Hematology and Oncology

## 2014-05-27 ENCOUNTER — Other Ambulatory Visit: Payer: Medicare Other

## 2014-07-01 ENCOUNTER — Other Ambulatory Visit: Payer: Self-pay | Admitting: *Deleted

## 2014-07-01 DIAGNOSIS — C50911 Malignant neoplasm of unspecified site of right female breast: Secondary | ICD-10-CM

## 2014-07-02 ENCOUNTER — Telehealth: Payer: Self-pay | Admitting: Hematology and Oncology

## 2014-07-02 ENCOUNTER — Other Ambulatory Visit (HOSPITAL_BASED_OUTPATIENT_CLINIC_OR_DEPARTMENT_OTHER): Payer: Medicare Other

## 2014-07-02 ENCOUNTER — Ambulatory Visit (HOSPITAL_BASED_OUTPATIENT_CLINIC_OR_DEPARTMENT_OTHER): Payer: Medicare Other | Admitting: Hematology and Oncology

## 2014-07-02 VITALS — BP 143/74 | HR 104 | Temp 98.4°F | Resp 18 | Ht 67.0 in | Wt 265.2 lb

## 2014-07-02 DIAGNOSIS — C50911 Malignant neoplasm of unspecified site of right female breast: Secondary | ICD-10-CM | POA: Diagnosis not present

## 2014-07-02 DIAGNOSIS — Z170 Estrogen receptor positive status [ER+]: Secondary | ICD-10-CM

## 2014-07-02 DIAGNOSIS — M8580 Other specified disorders of bone density and structure, unspecified site: Secondary | ICD-10-CM

## 2014-07-02 LAB — CBC WITH DIFFERENTIAL/PLATELET
BASO%: 0.6 % (ref 0.0–2.0)
Basophils Absolute: 0 10*3/uL (ref 0.0–0.1)
EOS ABS: 0.2 10*3/uL (ref 0.0–0.5)
EOS%: 2.6 % (ref 0.0–7.0)
HCT: 38.3 % (ref 34.8–46.6)
HGB: 11.9 g/dL (ref 11.6–15.9)
LYMPH#: 2.1 10*3/uL (ref 0.9–3.3)
LYMPH%: 28.1 % (ref 14.0–49.7)
MCH: 25.3 pg (ref 25.1–34.0)
MCHC: 31.1 g/dL — AB (ref 31.5–36.0)
MCV: 81.4 fL (ref 79.5–101.0)
MONO#: 0.9 10*3/uL (ref 0.1–0.9)
MONO%: 12.3 % (ref 0.0–14.0)
NEUT#: 4.1 10*3/uL (ref 1.5–6.5)
NEUT%: 56.4 % (ref 38.4–76.8)
Platelets: 238 10*3/uL (ref 145–400)
RBC: 4.71 10*6/uL (ref 3.70–5.45)
RDW: 15 % — ABNORMAL HIGH (ref 11.2–14.5)
WBC: 7.3 10*3/uL (ref 3.9–10.3)

## 2014-07-02 LAB — COMPREHENSIVE METABOLIC PANEL (CC13)
ALT: 14 U/L (ref 0–55)
AST: 13 U/L (ref 5–34)
Albumin: 3.7 g/dL (ref 3.5–5.0)
Alkaline Phosphatase: 88 U/L (ref 40–150)
Anion Gap: 12 mEq/L — ABNORMAL HIGH (ref 3–11)
BUN: 14.2 mg/dL (ref 7.0–26.0)
CO2: 23 mEq/L (ref 22–29)
CREATININE: 1.3 mg/dL — AB (ref 0.6–1.1)
Calcium: 9.1 mg/dL (ref 8.4–10.4)
Chloride: 106 mEq/L (ref 98–109)
EGFR: 47 mL/min/{1.73_m2} — AB (ref >90)
GLUCOSE: 107 mg/dL (ref 70–140)
Potassium: 4.1 mEq/L (ref 3.5–5.1)
SODIUM: 141 meq/L (ref 136–145)
Total Bilirubin: 0.23 mg/dL (ref 0.20–1.20)
Total Protein: 7.5 g/dL (ref 6.4–8.3)

## 2014-07-02 NOTE — Progress Notes (Signed)
Patient Care Team: Susy Frizzle, MD as PCP - General (Family Medicine)  DIAGNOSIS: Cancer of right breast   Staging form: Breast, AJCC 7th Edition     Pathologic: Stage IA (T1c, N0, cM0) - Signed by Thea Silversmith, MD on 06/28/2011   SUMMARY OF ONCOLOGIC HISTORY:   Cancer of right breast   06/11/2011 Surgery Right breast mastectomy with right axillary sentinel lymph node biopsy 1.3 cm invasive lobular cancer, grade 1, atypical lobular hyperplasia, focal LV I, ER 91%, PR 25%, HER-2 negative, 0/8 lymph nodes T1 cN0 stage IA   07/25/2011 -  Anti-estrogen oral therapy Letrozole 2.5 mg daily 7 years was a plan    CHIEF COMPLIANT: Follow-up of breast cancer  INTERVAL HISTORY: Diane Paul is a 74 year old lady with above-mentioned history of right-sided breast cancer with mastectomy followed by antiestrogen therapy with letrozole. She has been tolerating it fairly well without any major problems other than occasional hot flashes. She was found to have osteopenia and is now on getting Prolia injections. Denies any new lumps or nodules in the breasts.  REVIEW OF SYSTEMS:   Constitutional: Denies fevers, chills or abnormal weight loss Eyes: Denies blurriness of vision Ears, nose, mouth, throat, and face: Denies mucositis or sore throat Respiratory: Denies cough, dyspnea or wheezes Cardiovascular: Denies palpitation, chest discomfort significant lower extremity swelling Gastrointestinal:  Denies nausea, heartburn or change in bowel habits Skin: Denies abnormal skin rashes Lymphatics: Denies new lymphadenopathy or easy bruising Neurological:Denies numbness, tingling or new weaknesses Behavioral/Psych: Mood is stable, no new changes  Breast:  denies any pain or lumps or nodules in either breasts All other systems were reviewed with the patient and are negative.  I have reviewed the past medical history, past surgical history, social history and family history with the patient and they are  unchanged from previous note.  ALLERGIES:  is allergic to fosamax.  MEDICATIONS:  Current Outpatient Prescriptions  Medication Sig Dispense Refill  . amLODipine (NORVASC) 5 MG tablet TAKE 1 TABLET BY MOUTH EVERY DAY 30 tablet 5  . atorvastatin (LIPITOR) 20 MG tablet Take 1 tablet (20 mg total) by mouth daily. 30 tablet 3  . B Complex-C-Folic Acid (MULTIVITAMIN, STRESS FORMULA) tablet Take 1 tablet by mouth daily. gummie bear MVT    . Calcium Carbonate-Vit D-Min (CALTRATE PLUS PO) Take by mouth. $RemoveB'1200MG'YxStyFzs$  OF CALCIUM & 800IU OF VITAMIN D     . furosemide (LASIX) 40 MG tablet Take 1 tablet (40 mg total) by mouth daily as needed. 30 tablet 2  . letrozole (FEMARA) 2.5 MG tablet Take 1 tablet (2.5 mg total) by mouth daily. 90 tablet 4  . polyethylene glycol (MIRALAX / GLYCOLAX) packet Take 17 g by mouth daily as needed.     No current facility-administered medications for this visit.    PHYSICAL EXAMINATION: ECOG PERFORMANCE STATUS: 1 - Symptomatic but completely ambulatory  Filed Vitals:   07/02/14 0924  BP: 143/74  Pulse: 104  Temp: 98.4 F (36.9 C)  Resp: 18   Filed Weights   07/02/14 0924  Weight: 265 lb 3.2 oz (120.294 kg)    GENERAL:alert, no distress and comfortable SKIN: skin color, texture, turgor are normal, no rashes or significant lesions EYES: normal, Conjunctiva are pink and non-injected, sclera clear OROPHARYNX:no exudate, no erythema and lips, buccal mucosa, and tongue normal  NECK: supple, thyroid normal size, non-tender, without nodularity LYMPH:  no palpable lymphadenopathy in the cervical, axillary or inguinal LUNGS: clear to auscultation and percussion with normal  breathing effort HEART: regular rate & rhythm and no murmurs ; 3+ lower extremity edema ABDOMEN:abdomen soft, non-tender and normal bowel sounds Musculoskeletal:no cyanosis of digits and no clubbing  NEURO: alert & oriented x 3 with fluent speech, no focal motor/sensory deficits BREAST: No palpable  masses or nodules in either right chest wall or left breasts. No palpable axillary supraclavicular or infraclavicular adenopathy no breast tenderness or nipple discharge. (exam performed in the presence of a chaperone)  LABORATORY DATA:  I have reviewed the data as listed   Chemistry      Component Value Date/Time   NA 141 07/02/2014 0901   NA 140 06/01/2011 1305   K 4.1 07/02/2014 0901   K 4.0 06/01/2011 1305   CL 108* 09/19/2012 1156   CL 103 06/01/2011 1305   CO2 23 07/02/2014 0901   CO2 28 06/01/2011 1305   BUN 14.2 07/02/2014 0901   BUN 15 06/01/2011 1305   CREATININE 1.3* 07/02/2014 0901   CREATININE 1.01 06/01/2011 1305      Component Value Date/Time   CALCIUM 9.1 07/02/2014 0901   CALCIUM 10.0 06/01/2011 1305   ALKPHOS 88 07/02/2014 0901   ALKPHOS 97 06/01/2011 1305   AST 13 07/02/2014 0901   AST 17 06/01/2011 1305   ALT 14 07/02/2014 0901   ALT 19 06/01/2011 1305   BILITOT 0.23 07/02/2014 0901   BILITOT 0.2* 06/01/2011 1305       Lab Results  Component Value Date   WBC 7.3 07/02/2014   HGB 11.9 07/02/2014   HCT 38.3 07/02/2014   MCV 81.4 07/02/2014   PLT 238 07/02/2014   NEUTROABS 4.1 07/02/2014     RADIOGRAPHIC STUDIES: I have personally reviewed the radiology reports and agreed with their findings. Mammogram March 2015 is normal  ASSESSMENT & PLAN:  Cancer of right breast Right breast invasive lobular cancer status post mastectomy 06/11/2011 T1 cN0 M0 stage IA, 1.3 cm tumor, grade 1, ALH, focal LV I, ER 91%, PR 25%, HER-2 negative, 0/8 lymph nodes currently on antiestrogen therapy with letrozole 2.5 mg daily from March 2013  Letrozole toxicities: 1. Osteopenia on Prolia with calcium and vitamin D  2. Mild musculoskeletal symptoms 3. Hot flashes especially at night but manageable   Breast cancer surveillance: 1. Breast exam in the left breast is normal 07/02/2014 2. Left breast mammogram 08/03/2013 is normal  Survivorship:Discussed the  importance of physical exercise in decreasing the likelihood of breast cancer recurrence. Recommended 30 mins daily 6 days a week of either brisk walking or cycling or swimming. Encouraged patient to eat more fruits and vegetables and decrease red meat. Patient has high cholesterol and I recommended that she take Lipitor every day. She has been taking it every other day area I also asked her to discuss with her family doctor regarding her leg swelling.  Return to clinic in 1 year for follow-up          Orders Placed This Encounter  Procedures  . CBC with Differential    Standing Status: Future     Number of Occurrences:      Standing Expiration Date: 07/02/2015  . Comprehensive metabolic panel (Cmet) - [facility]    Standing Status: Future     Number of Occurrences:      Standing Expiration Date: 07/02/2015   The patient has a good understanding of the overall plan. she agrees with it. She will call with any problems that may develop before her next visit here.   Lindi Adie,  Tyler Deis, MD

## 2014-07-02 NOTE — Assessment & Plan Note (Addendum)
Right breast invasive lobular cancer status post mastectomy 06/11/2011 T1 cN0 M0 stage IA, 1.3 cm tumor, grade 1, ALH, focal LV I, ER 91%, PR 25%, HER-2 negative, 0/8 lymph nodes currently on antiestrogen therapy with letrozole 2.5 mg daily from March 2013  Letrozole toxicities: 1. Osteopenia on Prolia with calcium and vitamin D  2. Mild musculoskeletal symptoms 3. Hot flashes especially at night but manageable   Breast cancer surveillance: 1. Breast exam in the left breast is normal 07/02/2014 2. Left breast mammogram 08/03/2013 is normal  Survivorship:Discussed the importance of physical exercise in decreasing the likelihood of breast cancer recurrence. Recommended 30 mins daily 6 days a week of either brisk walking or cycling or swimming. Encouraged patient to eat more fruits and vegetables and decrease red meat. Patient has high cholesterol and I recommended that she take Lipitor every day. She has been taking it every other day area I also asked her to discuss with her family doctor regarding her leg swelling.  Return to clinic in 1 year for follow-up

## 2014-07-02 NOTE — Telephone Encounter (Signed)
per pof to sch pt appt-gave pt copy of sch °

## 2014-07-09 ENCOUNTER — Other Ambulatory Visit: Payer: Self-pay | Admitting: Oncology

## 2014-07-09 DIAGNOSIS — C50911 Malignant neoplasm of unspecified site of right female breast: Secondary | ICD-10-CM

## 2014-07-09 MED ORDER — LETROZOLE 2.5 MG PO TABS: 2.5000 mg | ORAL_TABLET | Freq: Every day | ORAL | Status: DC

## 2014-07-16 ENCOUNTER — Other Ambulatory Visit: Payer: Self-pay

## 2014-07-16 DIAGNOSIS — Z1231 Encounter for screening mammogram for malignant neoplasm of breast: Secondary | ICD-10-CM

## 2014-07-16 DIAGNOSIS — Z9011 Acquired absence of right breast and nipple: Secondary | ICD-10-CM

## 2014-08-13 ENCOUNTER — Ambulatory Visit
Admission: RE | Admit: 2014-08-13 | Discharge: 2014-08-13 | Disposition: A | Discharge status: Home / Self Care | Payer: Medicare Other | Source: Ambulatory Visit

## 2014-08-13 DIAGNOSIS — Z9011 Acquired absence of right breast and nipple: Secondary | ICD-10-CM

## 2014-08-13 DIAGNOSIS — Z1231 Encounter for screening mammogram for malignant neoplasm of breast: Secondary | ICD-10-CM | POA: Diagnosis not present

## 2014-09-17 ENCOUNTER — Other Ambulatory Visit: Payer: Self-pay | Admitting: Family Medicine

## 2014-10-12 ENCOUNTER — Ambulatory Visit (INDEPENDENT_AMBULATORY_CARE_PROVIDER_SITE_OTHER): Payer: Medicare Other | Admitting: Family Medicine

## 2014-10-12 ENCOUNTER — Encounter: Payer: Self-pay | Admitting: Family Medicine

## 2014-10-12 DIAGNOSIS — T63461A Toxic effect of venom of wasps, accidental (unintentional), initial encounter: Secondary | ICD-10-CM

## 2014-10-12 MED ORDER — METHYLPREDNISOLONE ACETATE 40 MG/ML IJ SUSP
40.0000 mg | Freq: Once | INTRAMUSCULAR | Status: AC
  Administered 2014-10-12: 40 mg via INTRAMUSCULAR

## 2014-10-12 NOTE — Addendum Note (Signed)
Addended by: Shary Decamp B on: 10/12/2014 02:59 PM   Modules accepted: Orders

## 2014-10-12 NOTE — Progress Notes (Signed)
Subjective:    Patient ID: Diane Paul, female    DOB: [birth year], 74 y.o.   MRN: [id number]  HPI  Patient walked in crying stating she had been stung by a wasp.    Was stung approximately 3 hours ago on the  Right side of her nose. Now there is pain and swelling in that area. She denies any difficulty breathing. She denies any stridor. She denies any  Wheezing. She denies any swelling in her tongue or lips. She denies any trouble swallowing. Past Medical History  Diagnosis Date  . Anemia   . Blood transfusion     post vaginal birth- 48   . Shortness of breath     sometimes   . Dry mouth   . Cancer     R breast cancer  . Arthritis     knees ?  Marland Kitchen Use of letrozole (Femara) 05/2010    neoadjuvant femara therapy since 1/212  . Anxiety     nervous sometimes, denies panica ttacks   . Hypertension   . Neuromuscular disorder     L leg, thigh- "burns sometimes"  . Breast cancer     Right Breast Cancer   Past Surgical History  Procedure Laterality Date  . Abdominal hysterectomy    . Tubal ligation    . Breast surgery      Right  . Mastectomy w/ sentinel node biopsy  06/11/2011/Right BReast    Procedure: MASTECTOMY WITH SENTINEL LYMPH NODE BIOPSY;  Surgeon: Adin Hector, MD;  Location: [place] [facility];  Service: General;  Laterality: Right;  Right total mastectomy and sentinel lymph node biopsy using lymphatic mapping and blue dye injection.  . Jp drains      s/p masectomy 06/11/11/ Dr. Fanny Skates, 2 jp drains right chest wall  . [place] fine needle aspiration wo/ w smear  05/22/10    Left Breast- cyst or abscess    Current Outpatient Prescriptions on File Prior to Visit  Medication Sig Dispense Refill  . amLODipine (NORVASC) 5 MG tablet TAKE 1 TABLET BY MOUTH EVERY DAY 30 tablet 2  . atorvastatin (LIPITOR) 20 MG tablet Take 1 tablet (20 mg total) by mouth daily. 30 tablet 3  . B Complex-C-Folic Acid (MULTIVITAMIN, STRESS FORMULA) tablet Take 1 tablet by mouth daily. gummie bear MVT      . Calcium Carbonate-Vit D-Min (CALTRATE PLUS PO) Take by mouth. 1200MG  OF CALCIUM & 800IU OF VITAMIN D     . furosemide (LASIX) 40 MG tablet Take 1 tablet (40 mg total) by mouth daily as needed. 30 tablet 2  . letrozole (FEMARA) 2.5 MG tablet Take 1 tablet (2.5 mg total) by mouth daily. 90 tablet 3  . polyethylene glycol (MIRALAX / GLYCOLAX) packet Take 17 g by mouth daily as needed.     No current facility-administered medications on file prior to visit.   Allergies  Allergen Reactions  . Fosamax [Alendronate Sodium] Other (See Comments)    Aches and pains   History   Social History  . Marital Status: Single    Spouse Name: N/A  . Number of Children: 5  . Years of Education: N/A   Occupational History  . Retired    Social History Main Topics  . Smoking status: Never Smoker   . Smokeless tobacco: Never Used  . Alcohol Use: No  . Drug Use: No  . Sexual Activity: Not Currently    Birth Control/ Protection: Post-menopausal   Other Topics Concern  . Not  on file   Social History Narrative     Review of Systems  All other systems reviewed and are negative.      Objective:   Physical Exam  Constitutional: She appears well-developed and well-nourished.  Neck: Neck supple.  Cardiovascular: Normal rate, regular rhythm and normal heart sounds.   Pulmonary/Chest: Effort normal and breath sounds normal. No stridor. No respiratory distress. She has no wheezes. She has no rales.  Abdominal: Soft. Bowel sounds are normal.  Lymphadenopathy:    She has no cervical adenopathy.  Vitals reviewed.  patient has mild swelling on the right side of her nose and some mild erythema near the site of the sting.        Assessment & Plan:  Wasp sting, accidental or unintentional, initial encounter    patient has a local allergic reaction to the wasp sting. I recommended Benadryl 25 mg  Every 6 hours as needed for itching and swelling. I will also give her 40 mg injection of  Depo-Medrol to calm down the initial swelling and ease the pain. At the present time there is no indication for epinephrine as the patient is having no evidence of an anaphylactic reaction

## 2014-11-28 ENCOUNTER — Other Ambulatory Visit: Payer: Self-pay | Admitting: Family Medicine

## 2014-12-21 ENCOUNTER — Other Ambulatory Visit: Payer: Self-pay | Admitting: Family Medicine

## 2014-12-21 NOTE — Telephone Encounter (Signed)
Refill appropriate and filled per protocol. 

## 2015-03-06 ENCOUNTER — Other Ambulatory Visit: Payer: Self-pay | Admitting: Family Medicine

## 2015-03-21 ENCOUNTER — Other Ambulatory Visit: Payer: Self-pay | Admitting: Family Medicine

## 2015-04-05 ENCOUNTER — Other Ambulatory Visit: Payer: Self-pay | Admitting: Family Medicine

## 2015-04-05 ENCOUNTER — Encounter: Payer: Self-pay | Admitting: Family Medicine

## 2015-04-05 NOTE — Telephone Encounter (Signed)
Needs office visit and labs before further refills will send letter 

## 2015-04-22 ENCOUNTER — Ambulatory Visit (INDEPENDENT_AMBULATORY_CARE_PROVIDER_SITE_OTHER): Payer: Medicare Other | Admitting: Family Medicine

## 2015-04-22 ENCOUNTER — Encounter: Payer: Self-pay | Admitting: Family Medicine

## 2015-04-22 VITALS — BP 142/88 | HR 78 | Temp 98.1°F | Resp 18 | Ht 68.0 in | Wt 258.0 lb

## 2015-04-22 DIAGNOSIS — I10 Essential (primary) hypertension: Secondary | ICD-10-CM | POA: Diagnosis not present

## 2015-04-22 DIAGNOSIS — E785 Hyperlipidemia, unspecified: Secondary | ICD-10-CM | POA: Diagnosis not present

## 2015-04-22 LAB — CBC WITH DIFFERENTIAL/PLATELET
Basophils Absolute: 0 10*3/uL (ref 0.0–0.1)
Basophils Relative: 0 % (ref 0–1)
EOS PCT: 3 % (ref 0–5)
Eosinophils Absolute: 0.2 10*3/uL (ref 0.0–0.7)
HEMATOCRIT: 38.5 % (ref 36.0–46.0)
HEMOGLOBIN: 12.4 g/dL (ref 12.0–15.0)
LYMPHS PCT: 32 % (ref 12–46)
Lymphs Abs: 2.3 10*3/uL (ref 0.7–4.0)
MCH: 25.8 pg — AB (ref 26.0–34.0)
MCHC: 32.2 g/dL (ref 30.0–36.0)
MCV: 80.2 fL (ref 78.0–100.0)
MONO ABS: 0.7 10*3/uL (ref 0.1–1.0)
MONOS PCT: 10 % (ref 3–12)
MPV: 10.6 fL (ref 8.6–12.4)
NEUTROS ABS: 3.9 10*3/uL (ref 1.7–7.7)
Neutrophils Relative %: 55 % (ref 43–77)
Platelets: 273 10*3/uL (ref 150–400)
RBC: 4.8 MIL/uL (ref 3.87–5.11)
RDW: 14.2 % (ref 11.5–15.5)
WBC: 7.1 10*3/uL (ref 4.0–10.5)

## 2015-04-22 LAB — LIPID PANEL
Cholesterol: 214 mg/dL — ABNORMAL HIGH (ref 125–200)
HDL: 46 mg/dL (ref >=46)
LDL CALC: 155 mg/dL — AB (ref <130)
Total CHOL/HDL Ratio: 4.7 Ratio (ref <=5.0)
Triglycerides: 66 mg/dL (ref <150)
VLDL: 13 mg/dL (ref <30)

## 2015-04-22 LAB — COMPLETE METABOLIC PANEL WITH GFR
ALBUMIN: 4 g/dL (ref 3.6–5.1)
ALK PHOS: 94 U/L (ref 33–130)
ALT: 16 U/L (ref 6–29)
AST: 15 U/L (ref 10–35)
BUN: 20 mg/dL (ref 7–25)
CALCIUM: 9.2 mg/dL (ref 8.6–10.4)
CHLORIDE: 102 mmol/L (ref 98–110)
CO2: 25 mmol/L (ref 20–31)
CREATININE: 1.05 mg/dL — AB (ref 0.60–0.93)
GFR, Est African American: 60 mL/min (ref >=60)
GFR, Est Non African American: 52 mL/min — ABNORMAL LOW (ref >=60)
GLUCOSE: 87 mg/dL (ref 70–99)
POTASSIUM: 4.5 mmol/L (ref 3.5–5.3)
SODIUM: 140 mmol/L (ref 135–146)
Total Bilirubin: 0.3 mg/dL (ref 0.2–1.2)
Total Protein: 7.2 g/dL (ref 6.1–8.1)

## 2015-04-22 NOTE — Progress Notes (Signed)
Subjective:    Patient ID: Diane Paul, female    DOB: 07/28/1940, 74 y.o.   MRN: [id number]  HPI Patient is here today for follow-up of her hypertension and hyperlipidemia. She discontinued Lipitor because it caused the pins and needles sensation in her head and made her feel tired so she did not take the medicine any further. She is compliant with her blood pressure medication. Her blood pressure today is borderline at 142/88. She denies any chest pain shortness of breath or dyspnea on exertion. She is due for a flu shot as well as the pneumonia vaccine. She refuses the flu shot. I did convince her to take the pneumonia vaccine. She is also due for a colonoscopy which she refuses. She also refuses the fecal occult blood cards. We discussed hepatitis C screening and she refuses that as well. Past Medical History  Diagnosis Date  . Anemia   . Blood transfusion     post vaginal birth- 21   . Shortness of breath     sometimes   . Dry mouth   . Cancer [facility])     R breast cancer  . Arthritis     knees ?  Marland Kitchen Use of letrozole (Femara) 05/2010    neoadjuvant femara therapy since 1/212  . Anxiety     nervous sometimes, denies panica ttacks   . Hypertension   . Neuromuscular disorder ([facility])     L leg, thigh- "burns sometimes"  . Breast cancer [facility])     Right Breast Cancer   Past Surgical History  Procedure Laterality Date  . Abdominal hysterectomy    . Tubal ligation    . Breast surgery      Right  . Mastectomy w/ sentinel node biopsy  06/11/2011/Right BReast    Procedure: MASTECTOMY WITH SENTINEL LYMPH NODE BIOPSY;  Surgeon: Adin Hector, MD;  Location: [place];  Service: General;  Laterality: Right;  Right total mastectomy and sentinel lymph node biopsy using lymphatic mapping and blue dye injection.  . Jp drains      s/p masectomy 06/11/11/ Dr. Fanny Skates, 2 jp drains right chest wall  . [place] fine needle aspiration wo/ w smear  05/22/10    Left Breast- cyst or abscess    Current  Outpatient Prescriptions on File Prior to Visit  Medication Sig Dispense Refill  . amLODipine (NORVASC) 5 MG tablet TAKE 1 TABLET BY MOUTH EVERY DAY 30 tablet 5  . B Complex-C-Folic Acid (MULTIVITAMIN, STRESS FORMULA) tablet Take 1 tablet by mouth daily. gummie bear MVT    . Calcium Carbonate-Vit D-Min (CALTRATE PLUS PO) Take by mouth. 1200MG  OF CALCIUM & 800IU OF VITAMIN D     . furosemide (LASIX) 40 MG tablet TAKE 1 TABLET BY MOUTH EVERY DAY AS NEEDED 30 tablet 0  . letrozole (FEMARA) 2.5 MG tablet Take 1 tablet (2.5 mg total) by mouth daily. 90 tablet 3  . polyethylene glycol (MIRALAX / GLYCOLAX) packet Take 17 g by mouth daily as needed.    Marland Kitchen atorvastatin (LIPITOR) 20 MG tablet Take 1 tablet (20 mg total) by mouth daily. (Patient not taking: Reported on 04/22/2015) 30 tablet 3   No current facility-administered medications on file prior to visit.   Allergies  Allergen Reactions  . Fosamax [Alendronate Sodium] Other (See Comments)    Aches and pains   Social History   Social History  . Marital Status: Single    Spouse Name: N/A  . Number of Children: 5  .  Years of Education: N/A   Occupational History  . Retired    Social History Main Topics  . Smoking status: Never Smoker   . Smokeless tobacco: Never Used  . Alcohol Use: No  . Drug Use: No  . Sexual Activity: Not Currently    Birth Control/ Protection: Post-menopausal   Other Topics Concern  . Not on file   Social History Narrative      Review of Systems  All other systems reviewed and are negative.      Objective:   Physical Exam  Neck: No thyromegaly present.  Cardiovascular: Normal rate, regular rhythm and normal heart sounds.   No murmur heard. Pulmonary/Chest: Effort normal and breath sounds normal. No respiratory distress. She has no wheezes. She has no rales.  Abdominal: Soft. Bowel sounds are normal. She exhibits no distension. There is no tenderness. There is no rebound.  Musculoskeletal: She  exhibits edema.  Lymphadenopathy:    She has no cervical adenopathy.  Vitals reviewed.         Assessment & Plan:  Essential hypertension - Plan: COMPLETE METABOLIC PANEL WITH GFR, CBC with Differential/Platelet, Lipid panel  HLD (hyperlipidemia) - Plan: COMPLETE METABOLIC PANEL WITH GFR, CBC with Differential/Platelet, Lipid panel  Blood pressures acceptable. I will check a fasting lipid panel. Goal LDL cholesterol is less than 130. I recommended a flu shot which the patient refused. I recommended hepatitis C screening which the patient refused. I recommended a colonoscopy which the patient refused. I then offered fecal occult blood testing was the patient declined. She will begrudgingly agreed to proceed with Pneumovax 23

## 2015-04-29 ENCOUNTER — Other Ambulatory Visit: Payer: Self-pay | Admitting: Family Medicine

## 2015-04-29 MED ORDER — PRAVASTATIN SODIUM 10 MG PO TABS: 10.0000 mg | ORAL_TABLET | Freq: Every day | ORAL | Status: DC

## 2015-05-08 ENCOUNTER — Other Ambulatory Visit: Payer: Self-pay | Admitting: Family Medicine

## 2015-06-29 ENCOUNTER — Other Ambulatory Visit: Payer: Self-pay | Admitting: Hematology and Oncology

## 2015-06-30 NOTE — Telephone Encounter (Signed)
Chart reviewed.

## 2015-07-06 ENCOUNTER — Other Ambulatory Visit: Payer: Self-pay

## 2015-07-06 ENCOUNTER — Telehealth: Payer: Self-pay | Admitting: Hematology and Oncology

## 2015-07-06 DIAGNOSIS — Z1231 Encounter for screening mammogram for malignant neoplasm of breast: Secondary | ICD-10-CM

## 2015-07-06 NOTE — Telephone Encounter (Signed)
Pt called to r/s appt due to being sick. Appt must be on Friday's. MD next available Friday is March 31

## 2015-07-08 ENCOUNTER — Other Ambulatory Visit: Payer: Medicare Other

## 2015-07-08 ENCOUNTER — Ambulatory Visit: Payer: Medicare Other | Admitting: Hematology and Oncology

## 2015-07-12 ENCOUNTER — Encounter: Payer: Self-pay | Admitting: *Deleted

## 2015-08-04 ENCOUNTER — Other Ambulatory Visit: Payer: Self-pay

## 2015-08-04 DIAGNOSIS — C50811 Malignant neoplasm of overlapping sites of right female breast: Secondary | ICD-10-CM | POA: Insufficient documentation

## 2015-08-04 NOTE — Assessment & Plan Note (Signed)
Right breast invasive lobular cancer status post mastectomy 06/11/2011 T1 cN0 M0 stage IA, 1.3 cm tumor, grade 1, ALH, focal LV I, ER 91%, PR 25%, HER-2 negative, 0/8 lymph nodes currently on antiestrogen therapy with letrozole 2.5 mg daily from March 2013  Letrozole toxicities: 1. Osteopenia on Prolia with calcium and vitamin D  2. Mild musculoskeletal symptoms 3. Hot flashes especially at night but manageable   Breast cancer surveillance: 1. Breast exam in the left breast is normal 08/04/2015 2. Left breast mammogram to be done 08/19/2015  Survivorship:Discussed the importance of physical exercise in decreasing the likelihood of breast cancer recurrence. Recommended 30 mins daily 6 days a week of either brisk walking or cycling or swimming. Encouraged patient to eat more fruits and vegetables and decrease red meat. Patient has high cholesterol and I recommended that she take Lipitor every day. She has been taking it every other day area I also asked her to discuss with her family doctor regarding her leg swelling.  Return to clinic in 1 year for follow-up

## 2015-08-05 ENCOUNTER — Telehealth: Payer: Self-pay | Admitting: Hematology and Oncology

## 2015-08-05 ENCOUNTER — Other Ambulatory Visit (HOSPITAL_BASED_OUTPATIENT_CLINIC_OR_DEPARTMENT_OTHER): Payer: Medicare Other

## 2015-08-05 ENCOUNTER — Encounter: Payer: Self-pay | Admitting: Hematology and Oncology

## 2015-08-05 ENCOUNTER — Ambulatory Visit (HOSPITAL_BASED_OUTPATIENT_CLINIC_OR_DEPARTMENT_OTHER): Payer: Medicare Other | Admitting: Hematology and Oncology

## 2015-08-05 VITALS — BP 135/69 | HR 113 | Temp 98.3°F | Resp 20 | Wt 252.0 lb

## 2015-08-05 DIAGNOSIS — M8580 Other specified disorders of bone density and structure, unspecified site: Secondary | ICD-10-CM

## 2015-08-05 DIAGNOSIS — C50411 Malignant neoplasm of upper-outer quadrant of right female breast: Secondary | ICD-10-CM

## 2015-08-05 DIAGNOSIS — C50811 Malignant neoplasm of overlapping sites of right female breast: Secondary | ICD-10-CM

## 2015-08-05 LAB — COMPREHENSIVE METABOLIC PANEL
ALBUMIN: 3.7 g/dL (ref 3.5–5.0)
ALK PHOS: 104 U/L (ref 40–150)
ALT: 17 U/L (ref 0–55)
ANION GAP: 9 meq/L (ref 3–11)
AST: 16 U/L (ref 5–34)
BUN: 20.9 mg/dL (ref 7.0–26.0)
CALCIUM: 9.4 mg/dL (ref 8.4–10.4)
CO2: 24 mEq/L (ref 22–29)
CREATININE: 1.1 mg/dL (ref 0.6–1.1)
Chloride: 108 mEq/L (ref 98–109)
EGFR: 55 mL/min/{1.73_m2} — AB (ref >90)
Glucose: 109 mg/dl (ref 70–140)
Potassium: 4.1 mEq/L (ref 3.5–5.1)
Sodium: 141 mEq/L (ref 136–145)
TOTAL PROTEIN: 7.8 g/dL (ref 6.4–8.3)

## 2015-08-05 LAB — CBC WITH DIFFERENTIAL/PLATELET
BASO%: 0.3 % (ref 0.0–2.0)
Basophils Absolute: 0 10*3/uL (ref 0.0–0.1)
EOS%: 2.9 % (ref 0.0–7.0)
Eosinophils Absolute: 0.2 10*3/uL (ref 0.0–0.5)
HCT: 38.7 % (ref 34.8–46.6)
HGB: 12.2 g/dL (ref 11.6–15.9)
LYMPH%: 31.6 % (ref 14.0–49.7)
MCH: 25.7 pg (ref 25.1–34.0)
MCHC: 31.5 g/dL (ref 31.5–36.0)
MCV: 81.6 fL (ref 79.5–101.0)
MONO#: 0.9 10*3/uL (ref 0.1–0.9)
MONO%: 11.8 % (ref 0.0–14.0)
NEUT#: 4.2 10*3/uL (ref 1.5–6.5)
NEUT%: 53.4 % (ref 38.4–76.8)
Platelets: 256 10*3/uL (ref 145–400)
RBC: 4.74 10*6/uL (ref 3.70–5.45)
RDW: 14.3 % (ref 11.2–14.5)
WBC: 7.9 10*3/uL (ref 3.9–10.3)
lymph#: 2.5 10*3/uL (ref 0.9–3.3)

## 2015-08-05 NOTE — Telephone Encounter (Signed)
appt made and avs printed. Bone density to be sch by central radiology

## 2015-08-05 NOTE — Progress Notes (Signed)
Unable to get in to exam room prior to MD.  No assessment performed.  

## 2015-08-05 NOTE — Progress Notes (Signed)
Patient Care Team: Susy Frizzle, MD as PCP - General (Family Medicine)  DIAGNOSIS: Breast cancer of upper-outer quadrant of right female breast [facility])   Staging form: Breast, AJCC 7th Edition     Pathologic: Stage IA (T1c, N0, cM0) - Signed by Thea Silversmith, MD on 06/28/2011  SUMMARY OF ONCOLOGIC HISTORY:   Breast cancer of upper-outer quadrant of right female breast ([place])   06/11/2011 Surgery Right breast mastectomy with right axillary sentinel lymph node biopsy 1.3 cm invasive lobular cancer, grade 1, atypical lobular hyperplasia, focal LV I, ER 91%, PR 25%, HER-2 negative, 0/8 lymph nodes T1 cN0 stage IA   07/25/2011 -  Anti-estrogen oral therapy Letrozole 2.5 mg daily 7 years was a plan   CHIEF COMPLIANT: follow-up on letrozole  INTERVAL HISTORY: Diane Paul is a 75 year old with above-mentioned history of right breast cancer treated with mastectomy and is currently on letrozole therapy. She has been on it for the past 4 years. She reports no major problems or concerns. Denies any hot flashes or myalgias. For osteoporosis she is been on prolia injections every 6 months.  REVIEW OF SYSTEMS:   Constitutional: Denies fevers, chills or abnormal weight loss Eyes: Denies blurriness of vision Ears, nose, mouth, throat, and face: Denies mucositis or sore throat Respiratory: Denies cough, dyspnea or wheezes Cardiovascular: Denies palpitation, chest discomfort Gastrointestinal:  Denies nausea, heartburn or change in bowel habits Skin: Denies abnormal skin rashes Lymphatics: Denies new lymphadenopathy or easy bruising Neurological:Denies numbness, tingling or new weaknesses Behavioral/Psych: Mood is stable, no new changes  Extremities: No lower extremity edema Breast:  denies any pain or lumps or nodules. Right mastectomy All other systems were reviewed with the patient and are negative.  I have reviewed the past medical history, past surgical history, social history and family  history with the patient and they are unchanged from previous note.  ALLERGIES:  is allergic to fosamax.  MEDICATIONS:  Current Outpatient Prescriptions  Medication Sig Dispense Refill  . amLODipine (NORVASC) 5 MG tablet TAKE 1 TABLET BY MOUTH EVERY DAY 30 tablet 5  . B Complex-C-Folic Acid (MULTIVITAMIN, STRESS FORMULA) tablet Take 1 tablet by mouth daily. gummie bear MVT    . Calcium Carbonate-Vit D-Min (CALTRATE PLUS PO) Take by mouth. '1200MG'$  OF CALCIUM & 800IU OF VITAMIN D     . furosemide (LASIX) 40 MG tablet TAKE 1 TABLET BY MOUTH EVERY DAY AS NEEDED 30 tablet 5  . letrozole (FEMARA) 2.5 MG tablet TAKE 1 TABLET (2.5 MG TOTAL) BY MOUTH DAILY. 90 tablet 3  . polyethylene glycol (MIRALAX / GLYCOLAX) packet Take 17 g by mouth daily as needed.    . pravastatin (PRAVACHOL) 10 MG tablet Take 1 tablet (10 mg total) by mouth at bedtime. 30 tablet 2   No current facility-administered medications for this visit.    PHYSICAL EXAMINATION: ECOG PERFORMANCE STATUS: 1 - Symptomatic but completely ambulatory  Filed Vitals:   08/05/15 0912  BP: 135/69  Pulse: 113  Temp: 98.3 F (36.8 C)  Resp: 20   Filed Weights   08/05/15 0912  Weight: 252 lb (114.306 kg)    GENERAL:alert, no distress and comfortable SKIN: skin color, texture, turgor are normal, no rashes or significant lesions EYES: normal, Conjunctiva are pink and non-injected, sclera clear OROPHARYNX:no exudate, no erythema and lips, buccal mucosa, and tongue normal  NECK: supple, thyroid normal size, non-tender, without nodularity LYMPH:  no palpable lymphadenopathy in the cervical, axillary or inguinal LUNGS: clear to auscultation and  percussion with normal breathing effort HEART: regular rate & rhythm and no murmurs and no lower extremity edema ABDOMEN:abdomen soft, non-tender and normal bowel sounds MUSCULOSKELETAL:no cyanosis of digits and no clubbing  NEURO: alert & oriented x 3 with fluent speech, no focal motor/sensory  deficits EXTREMITIES: No lower extremity edema BREAST:no palpable lumps or nodules in the left breast. Right mastectomy site appears to be normal. No palpable axillary supraclavicular or infraclavicular adenopathy no breast tenderness or nipple discharge. (exam performed in the presence of a chaperone)  LABORATORY DATA:  I have reviewed the data as listed   Chemistry      Component Value Date/Time   NA 140 04/22/2015 0844   NA 141 07/02/2014 0901   K 4.5 04/22/2015 0844   K 4.1 07/02/2014 0901   CL 102 04/22/2015 0844   CL 108* 09/19/2012 1156   CO2 25 04/22/2015 0844   CO2 23 07/02/2014 0901   BUN 20 04/22/2015 0844   BUN 14.2 07/02/2014 0901   CREATININE 1.05* 04/22/2015 0844   CREATININE 1.3* 07/02/2014 0901   CREATININE 1.01 06/01/2011 1305      Component Value Date/Time   CALCIUM 9.2 04/22/2015 0844   CALCIUM 9.1 07/02/2014 0901   ALKPHOS 94 04/22/2015 0844   ALKPHOS 88 07/02/2014 0901   AST 15 04/22/2015 0844   AST 13 07/02/2014 0901   ALT 16 04/22/2015 0844   ALT 14 07/02/2014 0901   BILITOT 0.3 04/22/2015 0844   BILITOT 0.23 07/02/2014 0901       Lab Results  Component Value Date   WBC 7.9 08/05/2015   HGB 12.2 08/05/2015   HCT 38.7 08/05/2015   MCV 81.6 08/05/2015   PLT 256 08/05/2015   NEUTROABS 4.2 08/05/2015   ASSESSMENT & PLAN:  Breast cancer of upper-outer quadrant of right female breast ([place]) Right breast invasive lobular cancer status post mastectomy 06/11/2011 T1 cN0 M0 stage IA, 1.3 cm tumor, grade 1, ALH, focal LV I, ER 91%, PR 25%, HER-2 negative, 0/8 lymph nodes currently on antiestrogen therapy with letrozole 2.5 mg daily from March 2013 Patient will complete 5 years of antiestrogen therapy by March 2018. At that time we will discontinue her antiestrogen treatment.  Letrozole toxicities: 1. Osteopenia patient received 1 dose of Prolia in July 2015. with calcium and vitamin D  2. Mild musculoskeletal symptoms 3. Hot flashes especially at  night but manageable   Breast cancer surveillance: 1. Breast exam in the left breast is normal 08/04/2015 2. Left breast mammogram to be done 08/19/2015 3. Bone density test will be ordered for 08/19/2015.  Survivorship:Discussed the importance of physical exercise in decreasing the likelihood of breast cancer recurrence. Recommended 30 mins daily 5 days a week Encouraged patient to eat more fruits and vegetables and decrease red meat.   Return to clinic in 1 year for follow-up   No orders of the defined types were placed in this encounter.   The patient has a good understanding of the overall plan. she agrees with it. she will call with any problems that may develop before the next visit here.   Rulon Eisenmenger, MD 08/05/2015

## 2015-08-09 ENCOUNTER — Other Ambulatory Visit: Payer: Self-pay

## 2015-08-09 DIAGNOSIS — C50411 Malignant neoplasm of upper-outer quadrant of right female breast: Secondary | ICD-10-CM

## 2015-08-09 DIAGNOSIS — M8580 Other specified disorders of bone density and structure, unspecified site: Secondary | ICD-10-CM

## 2015-08-09 DIAGNOSIS — Z79811 Long term (current) use of aromatase inhibitors: Secondary | ICD-10-CM

## 2015-08-19 ENCOUNTER — Ambulatory Visit
Admission: RE | Admit: 2015-08-19 | Discharge: 2015-08-19 | Disposition: A | Discharge status: Home / Self Care | Payer: Medicare Other | Source: Ambulatory Visit

## 2015-08-19 DIAGNOSIS — Z1231 Encounter for screening mammogram for malignant neoplasm of breast: Secondary | ICD-10-CM | POA: Diagnosis not present

## 2015-09-14 ENCOUNTER — Other Ambulatory Visit: Payer: Self-pay | Admitting: Family Medicine

## 2015-11-18 ENCOUNTER — Other Ambulatory Visit: Payer: Self-pay | Admitting: Family Medicine

## 2015-11-18 NOTE — Telephone Encounter (Signed)
Refill appropriate and filled per protocol. 

## 2016-03-14 ENCOUNTER — Other Ambulatory Visit: Payer: Self-pay | Admitting: Family Medicine

## 2016-03-14 MED ORDER — FUROSEMIDE 40 MG PO TABS: 40.0000 mg | ORAL_TABLET | Freq: Every day | ORAL | 3 refills | Status: DC | PRN

## 2016-03-14 MED ORDER — AMLODIPINE BESYLATE 5 MG PO TABS: 5.0000 mg | ORAL_TABLET | Freq: Every day | ORAL | 3 refills | Status: DC

## 2016-04-12 ENCOUNTER — Other Ambulatory Visit: Payer: Self-pay

## 2016-06-18 ENCOUNTER — Other Ambulatory Visit: Payer: Self-pay | Admitting: Hematology and Oncology

## 2016-07-11 ENCOUNTER — Telehealth: Payer: Self-pay | Admitting: Family Medicine

## 2016-07-11 MED ORDER — AMLODIPINE BESYLATE 5 MG PO TABS: 5.0000 mg | ORAL_TABLET | Freq: Every day | ORAL | 0 refills | Status: DC

## 2016-07-11 NOTE — Telephone Encounter (Signed)
Pt needs 4 pills to last until her appointment on Monday for norvasc sent to [facility].

## 2016-07-11 NOTE — Telephone Encounter (Signed)
Sent over for 30 - pt must keep appt. No further refills until appt is kept.

## 2016-07-16 ENCOUNTER — Ambulatory Visit: Payer: Medicare Other | Admitting: Family Medicine

## 2016-07-30 ENCOUNTER — Other Ambulatory Visit: Payer: Self-pay | Admitting: Family Medicine

## 2016-07-30 DIAGNOSIS — Z1231 Encounter for screening mammogram for malignant neoplasm of breast: Secondary | ICD-10-CM

## 2016-08-03 ENCOUNTER — Ambulatory Visit: Payer: Medicare Other | Admitting: Hematology and Oncology

## 2016-08-03 NOTE — Assessment & Plan Note (Deleted)
Right breast invasive lobular cancer status post mastectomy 06/11/2011 T1 cN0 M0 stage IA, 1.3 cm tumor, grade 1, ALH, focal LV I, ER 91%, PR 25%, HER-2 negative, 0/8 lymph nodes currently on antiestrogen therapy with letrozole 2.5 mg daily from March 2013 Patient will complete 5 years of antiestrogen therapy by March 2018. At that time we will discontinue her antiestrogen treatment.  Letrozole toxicities: 1. Osteopenia patient received 1 dose of Prolia in July 2015. with calcium and vitamin D  2. Mild musculoskeletal symptoms 3. Hot flashes especially at night but manageable   Breast cancer surveillance: 1. Breast exam in the left breast is normal 08/03/2016 2. Left breast mammogram to be done 08/20/2016 3. Bone density test will be ordered for 08/19/2015.  Survivorship:Discussed the importance of physical exercise in decreasing the likelihood of breast cancer recurrence. Recommended 30 mins daily 5 days a week Encouraged patient to eat more fruits and vegetables and decrease red meat.   Return to clinic in 1 year for follow-up

## 2016-08-09 ENCOUNTER — Other Ambulatory Visit: Payer: Self-pay | Admitting: Family Medicine

## 2016-08-09 NOTE — Telephone Encounter (Signed)
No further refills until pt seen in office.  Pt was made aware.

## 2016-08-17 ENCOUNTER — Ambulatory Visit (INDEPENDENT_AMBULATORY_CARE_PROVIDER_SITE_OTHER): Payer: Medicare Other | Admitting: Family Medicine

## 2016-08-17 ENCOUNTER — Other Ambulatory Visit: Payer: Self-pay | Admitting: Family Medicine

## 2016-08-17 ENCOUNTER — Encounter: Payer: Self-pay | Admitting: Family Medicine

## 2016-08-17 VITALS — BP 122/74 | HR 84 | Temp 97.9°F | Resp 18 | Ht 68.0 in | Wt 252.0 lb

## 2016-08-17 DIAGNOSIS — E7800 Pure hypercholesterolemia, unspecified: Secondary | ICD-10-CM | POA: Diagnosis not present

## 2016-08-17 DIAGNOSIS — I10 Essential (primary) hypertension: Secondary | ICD-10-CM | POA: Diagnosis not present

## 2016-08-17 DIAGNOSIS — Z1211 Encounter for screening for malignant neoplasm of colon: Secondary | ICD-10-CM | POA: Diagnosis not present
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
## 2016-08-18 LAB — COMPLETE METABOLIC PANEL WITH GFR
ALK PHOS: 86 U/L (ref 33–130)
ALT: 18 U/L (ref 6–29)
AST: 18 U/L (ref 10–35)
Albumin: 3.9 g/dL (ref 3.6–5.1)
BILIRUBIN TOTAL: 0.2 mg/dL (ref 0.2–1.2)
BUN: 14 mg/dL (ref 7–25)
CO2: 27 mmol/L (ref 20–31)
CREATININE: 1.14 mg/dL — AB (ref 0.60–0.93)
Calcium: 9.2 mg/dL (ref 8.6–10.4)
Chloride: 107 mmol/L (ref 98–110)
GFR, EST NON AFRICAN AMERICAN: 47 mL/min — AB (ref >=60)
GFR, Est African American: 54 mL/min — ABNORMAL LOW (ref >=60)
GLUCOSE: 102 mg/dL — AB (ref 70–99)
Potassium: 4 mmol/L (ref 3.5–5.3)
SODIUM: 142 mmol/L (ref 135–146)
TOTAL PROTEIN: 6.9 g/dL (ref 6.1–8.1)

## 2016-08-18 LAB — CBC WITH DIFFERENTIAL/PLATELET
BASOS ABS: 51 {cells}/uL (ref 0–200)
Basophils Relative: 1 %
EOS PCT: 4 %
Eosinophils Absolute: 204 cells/uL (ref 15–500)
HCT: 38.6 % (ref 35.0–45.0)
HEMOGLOBIN: 12.1 g/dL (ref 12.0–15.0)
LYMPHS ABS: 1887 {cells}/uL (ref 850–3900)
Lymphocytes Relative: 37 %
MCH: 25.7 pg — AB (ref 27.0–33.0)
MCHC: 31.3 g/dL — ABNORMAL LOW (ref 32.0–36.0)
MCV: 82 fL (ref 80.0–100.0)
MPV: 10.5 fL (ref 7.5–12.5)
Monocytes Absolute: 510 cells/uL (ref 200–950)
Monocytes Relative: 10 %
NEUTROS ABS: 2448 {cells}/uL (ref 1500–7800)
Neutrophils Relative %: 48 %
Platelets: 262 10*3/uL (ref 140–400)
RBC: 4.71 MIL/uL (ref 3.80–5.10)
RDW: 14.6 % (ref 11.0–15.0)
WBC: 5.1 10*3/uL (ref 3.8–10.8)

## 2016-08-18 LAB — LIPID PANEL
Cholesterol: 222 mg/dL — ABNORMAL HIGH (ref <200)
HDL: 44 mg/dL — ABNORMAL LOW (ref >50)
LDL Cholesterol: 155 mg/dL — ABNORMAL HIGH (ref <100)
Total CHOL/HDL Ratio: 5 Ratio — ABNORMAL HIGH (ref <5.0)
Triglycerides: 115 mg/dL (ref <150)
VLDL: 23 mg/dL (ref <30)

## 2016-08-20 ENCOUNTER — Ambulatory Visit
Admission: RE | Admit: 2016-08-20 | Discharge: 2016-08-20 | Disposition: A | Discharge status: Home / Self Care | Payer: Medicare Other | Source: Ambulatory Visit | Attending: Family Medicine | Admitting: Family Medicine

## 2016-08-20 DIAGNOSIS — Z1231 Encounter for screening mammogram for malignant neoplasm of breast: Secondary | ICD-10-CM | POA: Diagnosis not present

## 2017-02-27 ENCOUNTER — Other Ambulatory Visit: Payer: Self-pay | Admitting: Family Medicine

## 2017-02-27 MED ORDER — AMLODIPINE BESYLATE 5 MG PO TABS: 5.0000 mg | ORAL_TABLET | Freq: Every day | ORAL | 1 refills | Status: DC

## 2017-03-13 ENCOUNTER — Other Ambulatory Visit: Payer: Self-pay | Admitting: Family Medicine

## 2017-03-14 ENCOUNTER — Other Ambulatory Visit: Payer: Self-pay | Admitting: Nurse Practitioner

## 2017-03-16 ENCOUNTER — Other Ambulatory Visit: Payer: Self-pay | Admitting: Nurse Practitioner

## 2017-04-11 ENCOUNTER — Other Ambulatory Visit: Payer: Self-pay

## 2017-05-25 ENCOUNTER — Encounter (HOSPITAL_COMMUNITY): Payer: Self-pay | Admitting: *Deleted

## 2017-05-25 ENCOUNTER — Emergency Department (HOSPITAL_COMMUNITY)
Admission: EM | Admit: 2017-05-25 | Discharge: 2017-05-25 | Disposition: A | Discharge status: Home / Self Care | Payer: No Typology Code available for payment source | Attending: Emergency Medicine | Admitting: Emergency Medicine

## 2017-05-25 ENCOUNTER — Other Ambulatory Visit: Payer: Self-pay

## 2017-05-25 DIAGNOSIS — Z79899 Other long term (current) drug therapy: Secondary | ICD-10-CM | POA: Diagnosis not present

## 2017-05-25 DIAGNOSIS — L03115 Cellulitis of right lower limb: Secondary | ICD-10-CM | POA: Diagnosis not present

## 2017-05-25 DIAGNOSIS — I10 Essential (primary) hypertension: Secondary | ICD-10-CM | POA: Insufficient documentation

## 2017-05-25 DIAGNOSIS — M79671 Pain in right foot: Secondary | ICD-10-CM | POA: Diagnosis not present

## 2017-05-25 DIAGNOSIS — Z041 Encounter for examination and observation following transport accident: Secondary | ICD-10-CM | POA: Diagnosis not present

## 2017-05-25 DIAGNOSIS — Z853 Personal history of malignant neoplasm of breast: Secondary | ICD-10-CM | POA: Diagnosis not present
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
## 2017-06-03 DIAGNOSIS — L03115 Cellulitis of right lower limb: Secondary | ICD-10-CM | POA: Diagnosis not present

## 2017-08-19 ENCOUNTER — Other Ambulatory Visit: Payer: Self-pay | Admitting: Family Medicine

## 2017-08-19 DIAGNOSIS — Z1231 Encounter for screening mammogram for malignant neoplasm of breast: Secondary | ICD-10-CM

## 2017-08-21 ENCOUNTER — Ambulatory Visit
Admission: RE | Admit: 2017-08-21 | Discharge: 2017-08-21 | Disposition: A | Discharge status: Home / Self Care | Payer: Medicare Other | Source: Ambulatory Visit | Attending: Family Medicine | Admitting: Family Medicine

## 2017-08-21 DIAGNOSIS — Z1231 Encounter for screening mammogram for malignant neoplasm of breast: Secondary | ICD-10-CM | POA: Diagnosis not present

## 2017-12-05 ENCOUNTER — Ambulatory Visit (INDEPENDENT_AMBULATORY_CARE_PROVIDER_SITE_OTHER): Payer: Medicare Other | Admitting: Physician Assistant

## 2017-12-05 ENCOUNTER — Encounter: Payer: Self-pay | Admitting: Physician Assistant

## 2017-12-05 VITALS — BP 136/72 | HR 96 | Temp 97.7°F | Resp 14 | Ht 68.5 in | Wt 255.2 lb

## 2017-12-05 DIAGNOSIS — M25572 Pain in left ankle and joints of left foot: Secondary | ICD-10-CM

## 2017-12-05 NOTE — Progress Notes (Signed)
Patient ID: BLAYKLEE MABLE MRN: [id number], DOB: 03-12-1941, 77 y.o. Date of Encounter: 12/05/2017, 4:01 PM    Chief Complaint:  Chief Complaint  Patient presents with  . left ankle swollen     HPI: 77 y.o. year old female presents with above.   Reports that this past Monday (today is Thursday) went to visit someone at the hospital and had to walk up steep area of incline. Also later on Monday stepped in a hole.  States that on Tuesday morning when she woke up her left ankle seemed swollen and she was "having to hop on it ". ( pain with weight -bearing)  She soaked her left foot and ankle and Epson salt soak. Has used no other treatment.  States that currently sitting here in exam room she is feeling no pain. I asked if she had been woken with pain during the night.  She states that Monday night she felt a little bit of a throb.  She points to the anterior joint line and states that deep inside that area is where she feels some pain.   She has no other concerns to address today.     Home Meds:   Outpatient Medications Prior to Visit  Medication Sig Dispense Refill  . amLODipine (NORVASC) 5 MG tablet TAKE 1 TABLET BY MOUTH EVERY DAY 90 tablet 1  . B Complex-C-Folic Acid (MULTIVITAMIN, STRESS FORMULA) tablet Take 1 tablet by mouth daily. gummie bear MVT    . Calcium Carbonate-Vit D-Min (CALTRATE PLUS PO) Take by mouth. 1200MG  OF CALCIUM & 800IU OF VITAMIN D     . furosemide (LASIX) 40 MG tablet TAKE 1 TABLET BY MOUTH EVERY DAY AS NEEDED 90 tablet 3  . letrozole (FEMARA) 2.5 MG tablet TAKE 1 TABLET BY MOUTH DAILY. 90 tablet 3  . polyethylene glycol (MIRALAX / GLYCOLAX) packet Take 17 g by mouth daily as needed.     No facility-administered medications prior to visit.     Allergies:  Allergies  Allergen Reactions  . Fosamax [Alendronate Sodium] Other (See Comments)    Aches and pains      Review of Systems: See HPI for pertinent ROS. All other ROS negative.     Physical Exam: Blood pressure 136/72, pulse 96, temperature 97.7 F (36.5 C), temperature source Oral, resp. rate 14, height 5' 8.5" (1.74 m), weight 115.8 kg (255 lb 3.2 oz), SpO2 98 %., Body mass index is 38.24 kg/m. General:  Obese AAF. Appears in no acute distress. Neck: Supple. No thyromegaly. No lymphadenopathy. Lungs: Clear bilaterally to auscultation without wheezes, rales, or rhonchi. Breathing is unlabored. Heart: Regular rhythm. No murmurs, rubs, or gallops. Msk:   Left Ankle: She has a large protrusion at lateral malleolus bilaterally.  Consistent with an enlarged bursa sac.  I palpate her lateral malleolus region there is no tenderness. She points to the anterior joint line of the left ankle as area of discomfort.  I palpate this area-- she states that that causes no tenderness.  She states that there is no tenderness with palpation.  States that the discomfort she feels is "deep down in the bone ". Also palpated medial malleolus region and there is no tenderness with palpation in that area either. Inspection of the right ankle compared to the left ankle-- there is no appreciable swelling present. There is no visible ecchymosis or erythema with inspection. Had her perform range of motion of the ankle.  She is able to flex, extend, invert, evert.  Extremities/Skin: Warm and dry.  Neuro: Alert and oriented X 3. Moves all extremities spontaneously. Gait is normal. CNII-XII grossly in tact. Psych:  Responds to questions appropriately with a normal affect.     ASSESSMENT AND PLAN:  77 y.o. year old female with  1. Acute left ankle pain Suspect that she has a mild sprain. Recommend avoid weightbearing until symptoms resolve. Recommend rest and stay off of the left foot until symptoms resolve. Recommend elevate the left foot. Recommend apply ice to the left foot. Reviewed last renal function lab was performed 4/18 with mild CKD so would limit use of NSAID. Obtain x-ray to  rule out small fracture. - DG Ankle 2 Views Left; Future   Signed, Olean Ree [place], [place], [facility] 12/05/2017 4:01 PM

## 2018-01-14 ENCOUNTER — Other Ambulatory Visit: Payer: Medicare Other

## 2018-01-14 ENCOUNTER — Ambulatory Visit: Payer: Medicare Other | Admitting: Family Medicine

## 2018-01-14 DIAGNOSIS — I10 Essential (primary) hypertension: Secondary | ICD-10-CM

## 2018-01-14 DIAGNOSIS — G709 Myoneural disorder, unspecified: Secondary | ICD-10-CM | POA: Diagnosis not present

## 2018-01-14 DIAGNOSIS — E7800 Pure hypercholesterolemia, unspecified: Secondary | ICD-10-CM

## 2018-01-14 LAB — CBC WITH DIFFERENTIAL/PLATELET
Basophils Absolute: 33 cells/uL (ref 0–200)
Basophils Relative: 0.5 %
EOS ABS: 202 {cells}/uL (ref 15–500)
Eosinophils Relative: 3.1 %
HCT: 36.5 % (ref 35.0–45.0)
Hemoglobin: 11.7 g/dL (ref 11.7–15.5)
Lymphs Abs: 2080 cells/uL (ref 850–3900)
MCH: 25.8 pg — AB (ref 27.0–33.0)
MCHC: 32.1 g/dL (ref 32.0–36.0)
MCV: 80.4 fL (ref 80.0–100.0)
MONOS PCT: 11.5 %
MPV: 11 fL (ref 7.5–12.5)
Neutro Abs: 3439 cells/uL (ref 1500–7800)
Neutrophils Relative %: 52.9 %
PLATELETS: 257 10*3/uL (ref 140–400)
RBC: 4.54 10*6/uL (ref 3.80–5.10)
RDW: 13.4 % (ref 11.0–15.0)
TOTAL LYMPHOCYTE: 32 %
WBC: 6.5 10*3/uL (ref 3.8–10.8)
WBCMIX: 748 {cells}/uL (ref 200–950)

## 2018-01-14 LAB — COMPREHENSIVE METABOLIC PANEL
AG Ratio: 1.3 (calc) (ref 1.0–2.5)
ALBUMIN MSPROF: 4 g/dL (ref 3.6–5.1)
ALKALINE PHOSPHATASE (APISO): 103 U/L (ref 33–130)
ALT: 13 U/L (ref 6–29)
AST: 15 U/L (ref 10–35)
BUN/Creatinine Ratio: 11 (calc) (ref 6–22)
BUN: 14 mg/dL (ref 7–25)
CALCIUM: 9.1 mg/dL (ref 8.6–10.4)
CO2: 26 mmol/L (ref 20–32)
Chloride: 105 mmol/L (ref 98–110)
Creat: 1.24 mg/dL — ABNORMAL HIGH (ref 0.60–0.93)
Globulin: 3 g/dL (calc) (ref 1.9–3.7)
Glucose, Bld: 108 mg/dL — ABNORMAL HIGH (ref 65–99)
Potassium: 4.2 mmol/L (ref 3.5–5.3)
Sodium: 141 mmol/L (ref 135–146)
TOTAL PROTEIN: 7 g/dL (ref 6.1–8.1)
Total Bilirubin: 0.3 mg/dL (ref 0.2–1.2)

## 2018-01-14 LAB — LIPID PANEL
CHOL/HDL RATIO: 5.6 (calc) — AB (ref <5.0)
CHOLESTEROL: 228 mg/dL — AB (ref <200)
HDL: 41 mg/dL — ABNORMAL LOW (ref >50)
LDL Cholesterol (Calc): 169 mg/dL (calc) — ABNORMAL HIGH
Non-HDL Cholesterol (Calc): 187 mg/dL (calc) — ABNORMAL HIGH (ref <130)
Triglycerides: 74 mg/dL (ref <150)

## 2018-02-16 ENCOUNTER — Other Ambulatory Visit: Payer: Self-pay | Admitting: Family Medicine

## 2018-03-07 ENCOUNTER — Ambulatory Visit: Payer: Medicare Other | Admitting: Family Medicine

## 2018-03-11 ENCOUNTER — Encounter: Payer: Self-pay | Admitting: Family Medicine

## 2018-03-15 ENCOUNTER — Other Ambulatory Visit: Payer: Self-pay | Admitting: Family Medicine

## 2018-03-24 ENCOUNTER — Other Ambulatory Visit: Payer: Self-pay

## 2018-03-24 DIAGNOSIS — I10 Essential (primary) hypertension: Secondary | ICD-10-CM | POA: Diagnosis not present

## 2018-03-24 DIAGNOSIS — T1490XA Injury, unspecified, initial encounter: Secondary | ICD-10-CM | POA: Diagnosis not present

## 2018-03-24 DIAGNOSIS — M25561 Pain in right knee: Secondary | ICD-10-CM | POA: Diagnosis not present

## 2018-03-24 DIAGNOSIS — M25461 Effusion, right knee: Secondary | ICD-10-CM | POA: Diagnosis not present

## 2018-03-28 DIAGNOSIS — M25561 Pain in right knee: Secondary | ICD-10-CM | POA: Diagnosis not present

## 2018-07-29 ENCOUNTER — Other Ambulatory Visit: Payer: Self-pay | Admitting: Family Medicine

## 2018-07-29 DIAGNOSIS — Z1231 Encounter for screening mammogram for malignant neoplasm of breast: Secondary | ICD-10-CM

## 2018-08-20 ENCOUNTER — Other Ambulatory Visit: Payer: Self-pay | Admitting: *Deleted

## 2018-08-20 MED ORDER — AMLODIPINE BESYLATE 5 MG PO TABS: 5.0000 mg | ORAL_TABLET | Freq: Every day | ORAL | 0 refills | Status: DC

## 2018-09-23 ENCOUNTER — Ambulatory Visit: Payer: Medicare Other

## 2018-11-04 ENCOUNTER — Other Ambulatory Visit: Payer: Self-pay

## 2018-11-04 ENCOUNTER — Ambulatory Visit
Admission: RE | Admit: 2018-11-04 | Discharge: 2018-11-04 | Disposition: A | Discharge status: Home / Self Care | Payer: Medicare Other | Source: Ambulatory Visit | Attending: Family Medicine | Admitting: Family Medicine

## 2018-11-04 DIAGNOSIS — Z1231 Encounter for screening mammogram for malignant neoplasm of breast: Secondary | ICD-10-CM

## 2018-12-12 ENCOUNTER — Other Ambulatory Visit: Payer: Self-pay | Admitting: Family Medicine

## 2019-03-15 ENCOUNTER — Other Ambulatory Visit: Payer: Self-pay | Admitting: Family Medicine

## 2019-06-08 ENCOUNTER — Other Ambulatory Visit: Payer: Self-pay | Admitting: Family Medicine

## 2019-10-06 IMAGING — MG DIGITAL SCREENING UNILATERAL LEFT MAMMOGRAM WITH CAD AND TOMO
4 series · 4 of 12 positions shown · non-contrast
Comparison: Previous exam(s).

CLINICAL DATA: Screening.

EXAM:
DIGITAL SCREENING UNILATERAL LEFT MAMMOGRAM WITH CAD AND TOMO

[L CC synth-2D]
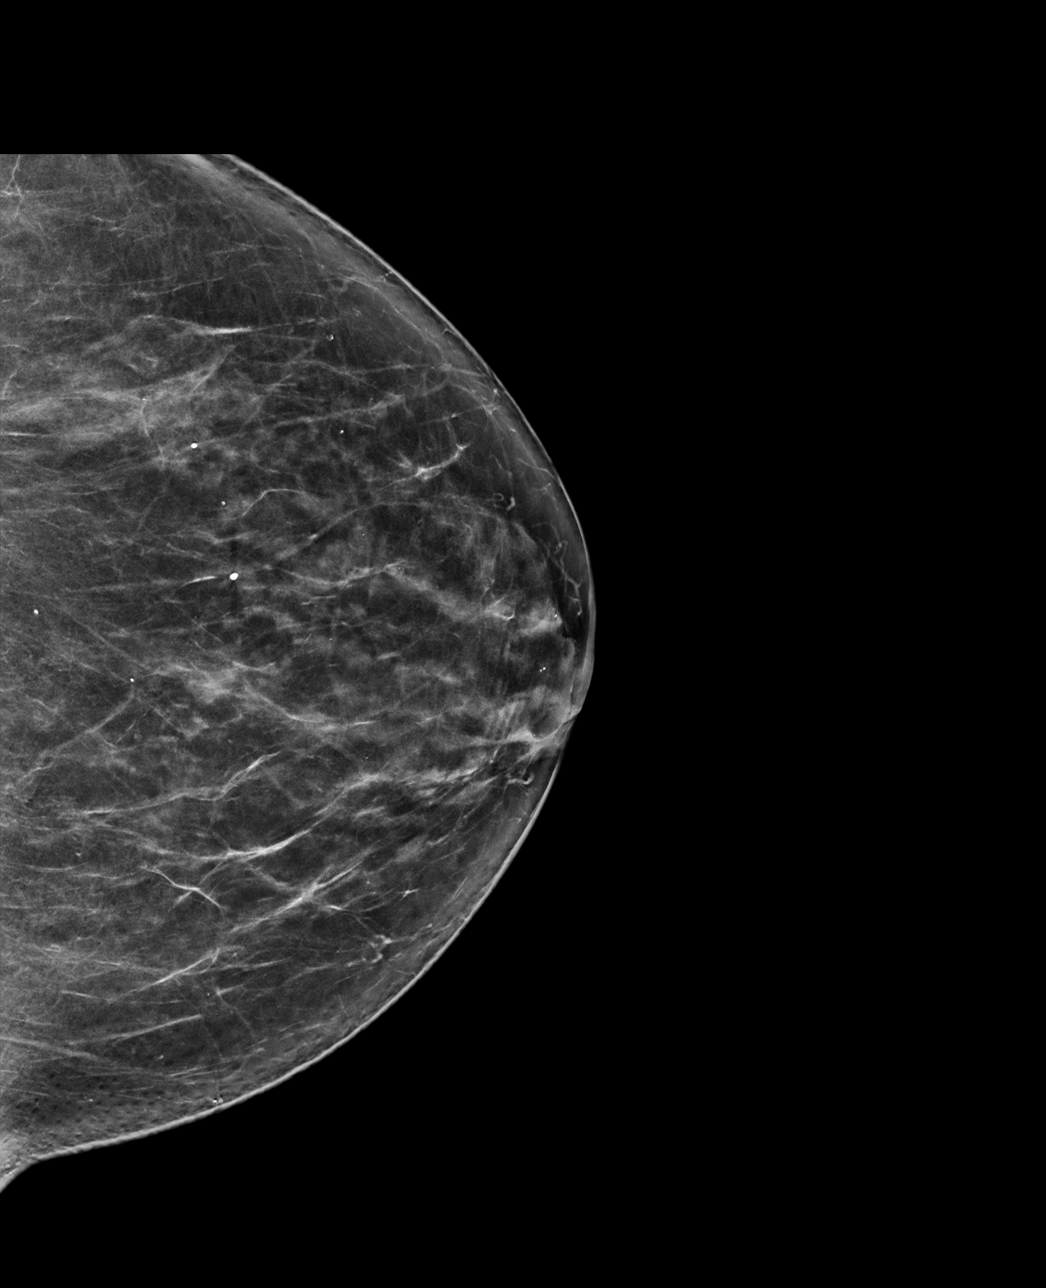

[L MLO synth-2D]
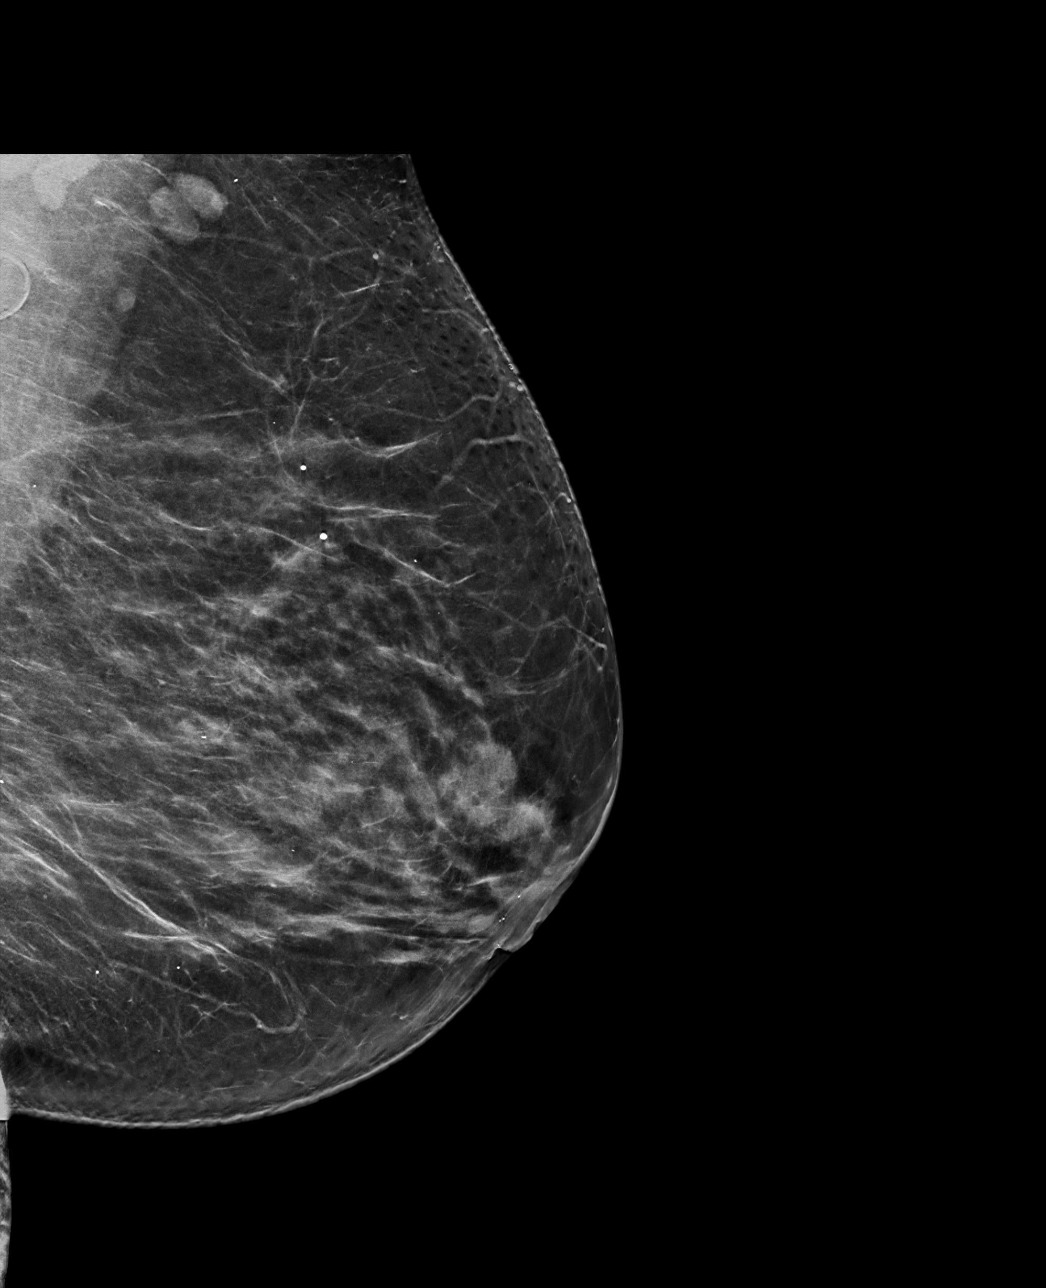

[L MLO tomo · tomo slice 40/79.0]
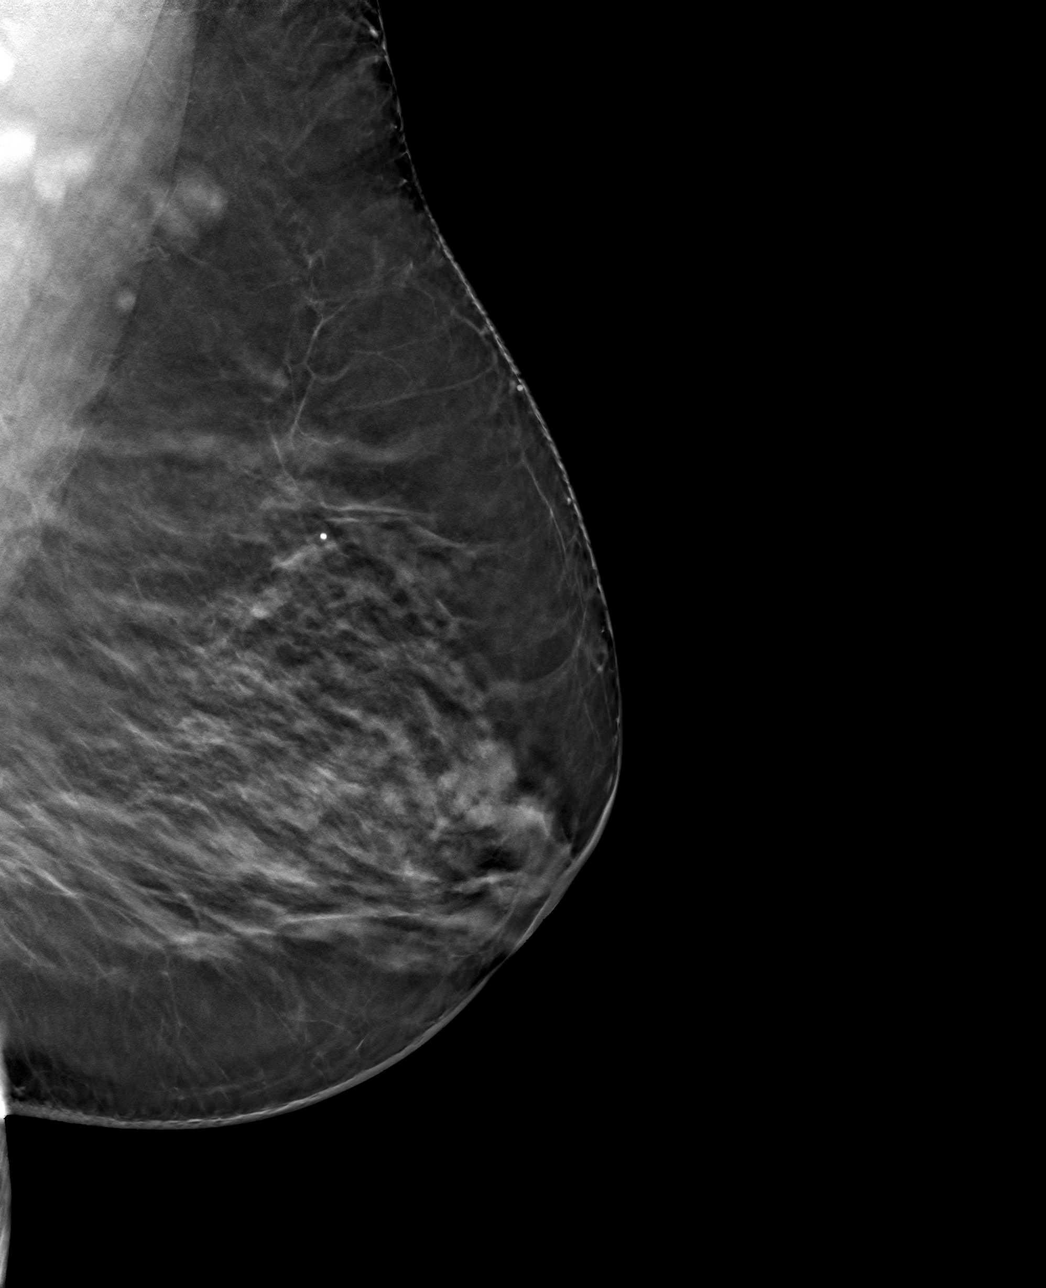

[L CC tomo · tomo slice 39/76.0]
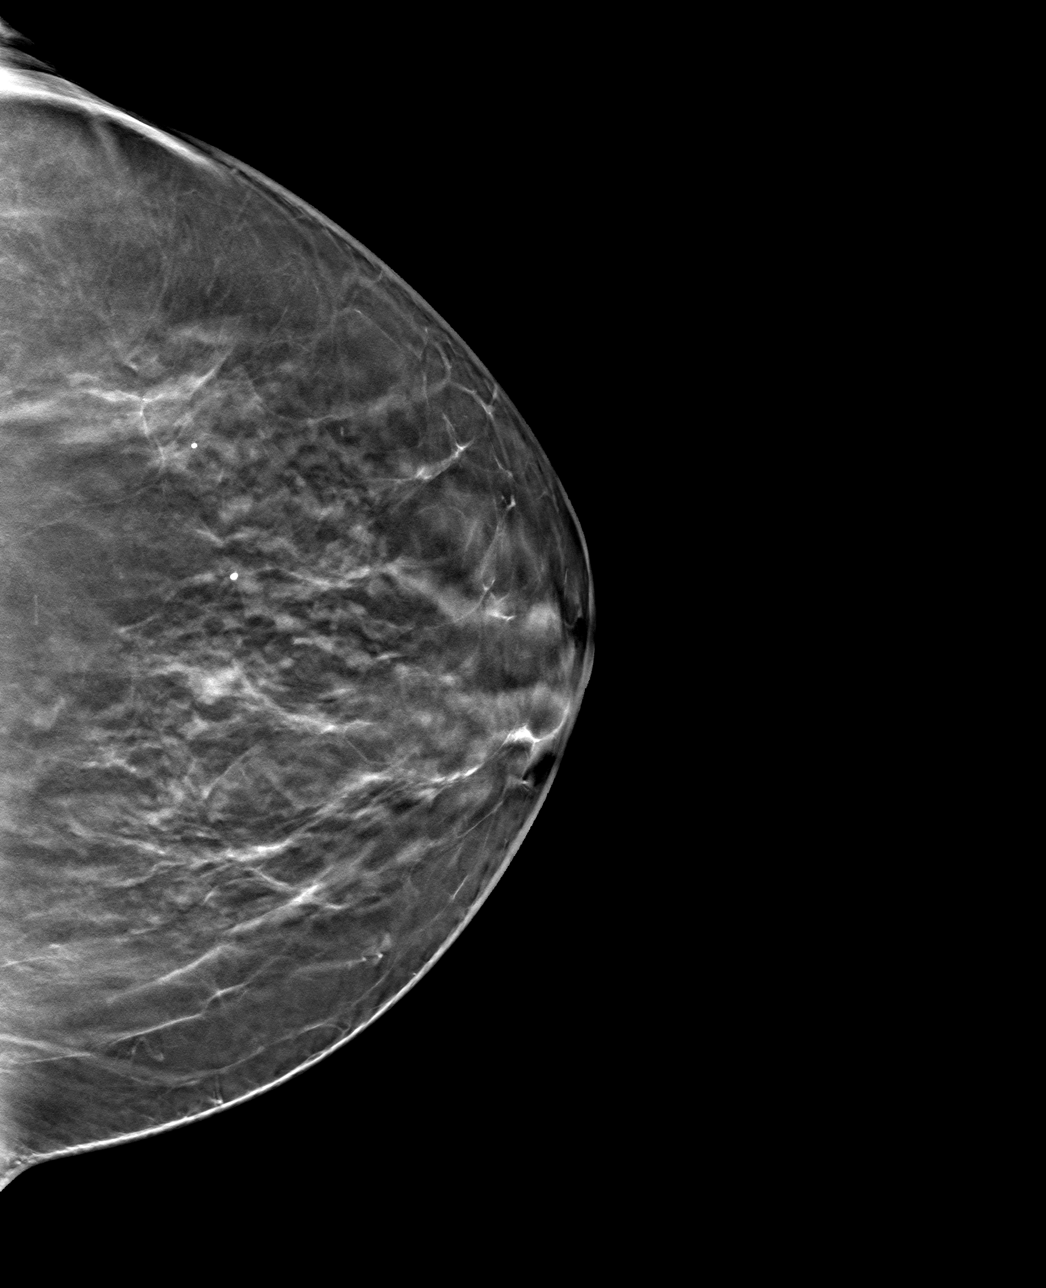

[4 of 12 positions shown; findings below may reference images not displayed]

ACR Breast Density Category c: The breast tissue is heterogeneously
dense, which may obscure small masses.
FINDINGS: There are no findings suspicious for malignancy. Images were
processed with CAD.
IMPRESSION: No mammographic evidence of malignancy. A result letter of this
screening mammogram will be mailed directly to the patient.

RECOMMENDATION:
Screening mammogram in one year. (Code:YR-2-EWS)

BI-RADS CATEGORY  1: Negative.

## 2020-03-09 ENCOUNTER — Other Ambulatory Visit: Payer: Self-pay | Admitting: Family Medicine

## 2020-03-26 ENCOUNTER — Other Ambulatory Visit: Payer: Self-pay | Admitting: Family Medicine

## 2020-03-31 ENCOUNTER — Other Ambulatory Visit: Payer: Self-pay | Admitting: Family Medicine

## 2020-06-29 DIAGNOSIS — M79672 Pain in left foot: Secondary | ICD-10-CM | POA: Diagnosis not present

## 2020-06-29 DIAGNOSIS — M79675 Pain in left toe(s): Secondary | ICD-10-CM | POA: Diagnosis not present

## 2020-07-18 DIAGNOSIS — M109 Gout, unspecified: Secondary | ICD-10-CM | POA: Diagnosis not present

## 2020-07-18 DIAGNOSIS — Z79899 Other long term (current) drug therapy: Secondary | ICD-10-CM | POA: Diagnosis not present

## 2020-07-18 DIAGNOSIS — Z1159 Encounter for screening for other viral diseases: Secondary | ICD-10-CM | POA: Diagnosis not present

## 2020-07-18 DIAGNOSIS — M129 Arthropathy, unspecified: Secondary | ICD-10-CM | POA: Diagnosis not present

## 2020-07-19 DIAGNOSIS — Z79899 Other long term (current) drug therapy: Secondary | ICD-10-CM | POA: Diagnosis not present

## 2020-07-19 DIAGNOSIS — M129 Arthropathy, unspecified: Secondary | ICD-10-CM | POA: Diagnosis not present

## 2020-07-19 DIAGNOSIS — Z1159 Encounter for screening for other viral diseases: Secondary | ICD-10-CM | POA: Diagnosis not present

## 2020-07-19 DIAGNOSIS — M109 Gout, unspecified: Secondary | ICD-10-CM | POA: Diagnosis not present

## 2020-07-26 ENCOUNTER — Other Ambulatory Visit: Payer: Self-pay | Admitting: Family Medicine

## 2020-08-12 ENCOUNTER — Ambulatory Visit (INDEPENDENT_AMBULATORY_CARE_PROVIDER_SITE_OTHER): Payer: Medicare HMO | Admitting: Family Medicine

## 2020-08-12 ENCOUNTER — Other Ambulatory Visit: Payer: Self-pay

## 2020-08-12 ENCOUNTER — Encounter: Payer: Self-pay | Admitting: Family Medicine

## 2020-08-12 VITALS — BP 132/64 | HR 84 | Temp 97.9°F | Resp 16 | Ht 68.5 in | Wt 239.0 lb

## 2020-08-12 DIAGNOSIS — M109 Gout, unspecified: Secondary | ICD-10-CM

## 2020-08-12 NOTE — Progress Notes (Signed)
Subjective:    Patient ID: Diane Paul, female    DOB: 05-11-1940, 80 y.o.   MRN: [id number]  HPI  I have not seen this patient in over 2 years.  Recently she went to an urgent care due to severe pain in her left great toe.  Is on the medial aspect of the interphalangeal joint.  She states that she went to bed 1 night and it was fine.  The next morning she woke up and it was red hot and extremely painful.  She went to an urgent care where she was placed on Augmentin, colchicine, and prednisone.  Today on exam the skin is slightly pink around the interphalangeal joint.  It is not warm.  There is no tenderness to palpation.  However there does appear to be tophaceous crystals forming on the medial aspect of the interphalangeal joint.  She states that she believes she is had to gout attacks in the last year that felt similar to this.  Today she is pain-free. Past Medical History:  Diagnosis Date  . Anemia   . Anxiety    nervous sometimes, denies panica ttacks   . Arthritis    knees ?  Marland Kitchen Blood transfusion    post vaginal birth- 59   . Breast cancer ([place])    Right Breast Cancer  . Cancer [facility])    R breast cancer  . Dry mouth   . Hypertension   . Neuromuscular disorder (HCC)    L leg, thigh- "burns sometimes"  . Shortness of breath    sometimes   . Use of letrozole (Femara) 05/2010   neoadjuvant femara therapy since 1/212   Past Surgical History:  Procedure Laterality Date  . ABDOMINAL HYSTERECTOMY    . BREAST LUMPECTOMY    . BREAST SURGERY     Right  . jp drains     s/p masectomy 06/11/11/ Dr. Fanny Skates, 2 jp drains right chest wall  . MASTECTOMY Right    malignant  . MASTECTOMY W/ SENTINEL NODE BIOPSY  06/11/2011/Right BReast   Procedure: MASTECTOMY WITH SENTINEL LYMPH NODE BIOPSY;  Surgeon: Adin Hector, MD;  Location: [place];  Service: General;  Laterality: Right;  Right total mastectomy and sentinel lymph node biopsy using lymphatic mapping and blue dye injection.   . TUBAL LIGATION    . [place] FINE NEEDLE ASPIRATION WO/ W SMEAR  05/22/10   Left Breast- cyst or abscess    Current Outpatient Medications on File Prior to Visit  Medication Sig Dispense Refill  . amLODipine (NORVASC) 5 MG tablet TAKE 1 TABLET BY MOUTH EVERY DAY 30 tablet 0  . B Complex-C-Folic Acid (MULTIVITAMIN, STRESS FORMULA) tablet Take 1 tablet by mouth daily. gummie bear MVT    . Calcium Carbonate-Vit D-Min (CALTRATE PLUS PO) Take by mouth. 1200MG  OF CALCIUM & 800IU OF VITAMIN D    . furosemide (LASIX) 40 MG tablet TAKE 1 TABLET BY MOUTH EVERY DAY AS NEEDED 30 tablet 3  . letrozole (FEMARA) 2.5 MG tablet TAKE 1 TABLET BY MOUTH DAILY. 90 tablet 3  . polyethylene glycol (MIRALAX / GLYCOLAX) packet Take 17 g by mouth daily as needed.     No current facility-administered medications on file prior to visit.   Allergies  Allergen Reactions  . Fosamax [Alendronate Sodium] Other (See Comments)    Aches and pains   Social History   Socioeconomic History  . Marital status: Single    Spouse name: Not on file  . Number  of children: 5  . Years of education: Not on file  . Highest education level: Not on file  Occupational History  . Occupation: Retired  Tobacco Use  . Smoking status: Never Smoker  . Smokeless tobacco: Never Used  Substance and Sexual Activity  . Alcohol use: No  . Drug use: No  . Sexual activity: Not Currently    Birth control/protection: Post-menopausal  Other Topics Concern  . Not on file  Social History Narrative  . Not on file   Social Determinants of Health   Financial Resource Strain: Not on file  Food Insecurity: Not on file  Transportation Needs: Not on file  Physical Activity: Not on file  Stress: Not on file  Social Connections: Not on file  Intimate Partner Violence: Not on file     Review of Systems  All other systems reviewed and are negative.      Objective:   Physical Exam Vitals reviewed.  Constitutional:      Appearance: Normal  appearance. She is obese.  Cardiovascular:     Rate and Rhythm: Normal rate and regular rhythm.     Heart sounds: Normal heart sounds. No murmur heard.   Pulmonary:     Effort: Pulmonary effort is normal.     Breath sounds: Normal breath sounds.  Musculoskeletal:     Left foot: Decreased range of motion. Deformity present.       Feet:  Feet:     Left foot:     Skin integrity: Erythema and callus present. No warmth.  Neurological:     Mental Status: She is alert.           Assessment & Plan:  Acute gout involving toe of left foot, unspecified cause - Plan: CBC with Differential/Platelet, COMPLETE METABOLIC PANEL WITH GFR, Uric acid  Given what appears to be tophaceous gout, I will check a uric acid level.  If greater than 6 I would recommend starting allopurinol.  Also want a check a CMP to evaluate for chronic kidney disease as a potential cause.  Also check a CBC to evaluate for any bone marrow abnormalities.

## 2020-08-13 LAB — CBC WITH DIFFERENTIAL/PLATELET
Absolute Monocytes: 655 cells/uL (ref 200–950)
Basophils Absolute: 41 cells/uL (ref 0–200)
Basophils Relative: 0.7 %
Eosinophils Absolute: 130 cells/uL (ref 15–500)
Eosinophils Relative: 2.2 %
HCT: 41 % (ref 35.0–45.0)
Hemoglobin: 12.7 g/dL (ref 11.7–15.5)
Lymphs Abs: 2248 cells/uL (ref 850–3900)
MCH: 25.5 pg — ABNORMAL LOW (ref 27.0–33.0)
MCHC: 31 g/dL — ABNORMAL LOW (ref 32.0–36.0)
MCV: 82.2 fL (ref 80.0–100.0)
MPV: 11.1 fL (ref 7.5–12.5)
Monocytes Relative: 11.1 %
Neutro Abs: 2826 cells/uL (ref 1500–7800)
Neutrophils Relative %: 47.9 %
Platelets: 287 10*3/uL (ref 140–400)
RBC: 4.99 10*6/uL (ref 3.80–5.10)
RDW: 13.2 % (ref 11.0–15.0)
Total Lymphocyte: 38.1 %
WBC: 5.9 10*3/uL (ref 3.8–10.8)

## 2020-08-13 LAB — COMPLETE METABOLIC PANEL WITH GFR
AG Ratio: 1.6 (calc) (ref 1.0–2.5)
ALT: 16 U/L (ref 6–29)
AST: 16 U/L (ref 10–35)
Albumin: 4.3 g/dL (ref 3.6–5.1)
Alkaline phosphatase (APISO): 73 U/L (ref 37–153)
BUN/Creatinine Ratio: 11 (calc) (ref 6–22)
BUN: 14 mg/dL (ref 7–25)
CO2: 25 mmol/L (ref 20–32)
Calcium: 9.5 mg/dL (ref 8.6–10.4)
Chloride: 105 mmol/L (ref 98–110)
Creat: 1.31 mg/dL — ABNORMAL HIGH (ref 0.60–0.93)
GFR, Est African American: 45 mL/min/{1.73_m2} — ABNORMAL LOW (ref >=60)
GFR, Est Non African American: 39 mL/min/{1.73_m2} — ABNORMAL LOW (ref >=60)
Globulin: 2.7 g/dL (calc) (ref 1.9–3.7)
Glucose, Bld: 102 mg/dL — ABNORMAL HIGH (ref 65–99)
Potassium: 4.6 mmol/L (ref 3.5–5.3)
Sodium: 142 mmol/L (ref 135–146)
Total Bilirubin: 0.3 mg/dL (ref 0.2–1.2)
Total Protein: 7 g/dL (ref 6.1–8.1)

## 2020-08-13 LAB — URIC ACID: Uric Acid, Serum: 8.6 mg/dL — ABNORMAL HIGH (ref 2.5–7.0)

## 2020-09-13 ENCOUNTER — Other Ambulatory Visit: Payer: Self-pay

## 2020-09-13 ENCOUNTER — Telehealth (INDEPENDENT_AMBULATORY_CARE_PROVIDER_SITE_OTHER): Payer: Medicare HMO | Admitting: Family Medicine

## 2020-09-13 DIAGNOSIS — M109 Gout, unspecified: Secondary | ICD-10-CM | POA: Diagnosis not present

## 2020-09-13 DIAGNOSIS — L03039 Cellulitis of unspecified toe: Secondary | ICD-10-CM | POA: Diagnosis not present

## 2020-09-13 MED ORDER — SULFAMETHOXAZOLE-TRIMETHOPRIM 800-160 MG PO TABS: 1.0000 | ORAL_TABLET | Freq: Two times a day (BID) | ORAL | 0 refills | Status: DC

## 2020-09-13 NOTE — Progress Notes (Addendum)
Subjective:    Patient ID: Diane Paul, female    DOB: [birth year], 80 y.o.   MRN: [id number]  HPI 08/12/20 I have not seen this patient in over 2 years.  Recently she went to an urgent care due to severe pain in her left great toe.  Is on the medial aspect of the interphalangeal joint.  She states that she went to bed 1 night and it was fine.  The next morning she woke up and it was red hot and extremely painful.  She went to an urgent care where she was placed on Augmentin, colchicine, and prednisone.  Today on exam the skin is slightly pink around the interphalangeal joint.  It is not warm.  There is no tenderness to palpation.  However there does appear to be tophaceous crystals forming on the medial aspect of the interphalangeal joint.  She states that she believes she is had to gout attacks in the last year that felt similar to this.  Today she is pain-free. At that time, my plan was: Given what appears to be tophaceous gout, I will check a uric acid level.  If greater than 6 I would recommend starting allopurinol.  Also want a check a CMP to evaluate for chronic kidney disease as a potential cause.  Also check a CBC to evaluate for any bone marrow abnormalities.  09/13/20 This is a telephone visit.  Patient consents to be seen via telephone.  She is at home.  I am at my office.  Phone call began at [employer] concluded at 1220.  patient states that she has some tenderness over her left great toe where she had a gout flare.  The tenderness is near the nail margin.  She states the skin there was red and swollen and there was "pus in it".  She had to lance it with a needle to get the pus out.  The swelling is going down.  However she still has twinges of pain.  She is not taking colchicine.  She is not taking allopurinol.  Uric acid level was 8.6. Past Medical History:  Diagnosis Date  . Anemia   . Anxiety    nervous sometimes, denies panica ttacks   . Arthritis    knees ?  Marland Kitchen Blood  transfusion    post vaginal birth- 44   . Breast cancer ([place])    Right Breast Cancer  . Cancer [facility])    R breast cancer  . Dry mouth   . Hypertension   . Neuromuscular disorder (HCC)    L leg, thigh- "burns sometimes"  . Shortness of breath    sometimes   . Use of letrozole (Femara) 05/2010   neoadjuvant femara therapy since 1/212   Past Surgical History:  Procedure Laterality Date  . ABDOMINAL HYSTERECTOMY    . BREAST LUMPECTOMY    . BREAST SURGERY     Right  . jp drains     s/p masectomy 06/11/11/ Dr. Fanny Skates, 2 jp drains right chest wall  . MASTECTOMY Right    malignant  . MASTECTOMY W/ SENTINEL NODE BIOPSY  06/11/2011/Right BReast   Procedure: MASTECTOMY WITH SENTINEL LYMPH NODE BIOPSY;  Surgeon: Adin Hector, MD;  Location: [place];  Service: General;  Laterality: Right;  Right total mastectomy and sentinel lymph node biopsy using lymphatic mapping and blue dye injection.  . TUBAL LIGATION    . [place] FINE NEEDLE ASPIRATION WO/ W SMEAR  05/22/10   Left Breast- cyst  or abscess    Current Outpatient Medications on File Prior to Visit  Medication Sig Dispense Refill  . amLODipine (NORVASC) 5 MG tablet TAKE 1 TABLET BY MOUTH EVERY DAY 30 tablet 0  . B Complex-C-Folic Acid (MULTIVITAMIN, STRESS FORMULA) tablet Take 1 tablet by mouth daily. gummie bear MVT    . Calcium Carbonate-Vit D-Min (CALTRATE PLUS PO) Take by mouth. 1200MG  OF CALCIUM & 800IU OF VITAMIN D    . furosemide (LASIX) 40 MG tablet TAKE 1 TABLET BY MOUTH EVERY DAY AS NEEDED 30 tablet 3  . letrozole (FEMARA) 2.5 MG tablet TAKE 1 TABLET BY MOUTH DAILY. 90 tablet 3  . polyethylene glycol (MIRALAX / GLYCOLAX) packet Take 17 g by mouth daily as needed.     No current facility-administered medications on file prior to visit.   Allergies  Allergen Reactions  . Fosamax [Alendronate Sodium] Other (See Comments)    Aches and pains   Social History   Socioeconomic History  . Marital status: Single    Spouse  name: Not on file  . Number of children: 5  . Years of education: Not on file  . Highest education level: Not on file  Occupational History  . Occupation: Retired  Tobacco Use  . Smoking status: Never Smoker  . Smokeless tobacco: Never Used  Substance and Sexual Activity  . Alcohol use: No  . Drug use: No  . Sexual activity: Not Currently    Birth control/protection: Post-menopausal  Other Topics Concern  . Not on file  Social History Narrative  . Not on file   Social Determinants of Health   Financial Resource Strain: Not on file  Food Insecurity: Not on file  Transportation Needs: Not on file  Physical Activity: Not on file  Stress: Not on file  Social Connections: Not on file  Intimate Partner Violence: Not on file     Review of Systems  All other systems reviewed and are negative.      Objective:          Assessment & Plan:  Acute gout involving toe of left foot, unspecified cause  Paronychia of great toe  I am not sure if the patient had a paronychia or tophaceous gout.  I recommended treating both.  I recommended starting Bactrim double strength tablets twice daily for 7 days for possible paronychia and also starting colchicine 0.6 mg daily for gout.  Once the pain has improved, I want the patient to call me back so I can call in allopurinol 200 mg a day in an effort to try to lower her uric acid level

## 2020-09-27 ENCOUNTER — Other Ambulatory Visit: Payer: Self-pay | Admitting: *Deleted

## 2020-09-27 MED ORDER — FUROSEMIDE 40 MG PO TABS
40.0000 mg | ORAL_TABLET | Freq: Every day | ORAL | 3 refills | Status: DC | PRN
Start: 2020-09-27 — End: 2023-02-12

## 2020-10-05 DIAGNOSIS — H40013 Open angle with borderline findings, low risk, bilateral: Secondary | ICD-10-CM | POA: Diagnosis not present

## 2020-10-17 ENCOUNTER — Other Ambulatory Visit: Payer: Self-pay | Admitting: *Deleted

## 2020-10-17 MED ORDER — AMLODIPINE BESYLATE 5 MG PO TABS: 1.0000 | ORAL_TABLET | Freq: Every day | ORAL | 0 refills | Status: DC

## 2020-10-18 DIAGNOSIS — Z01818 Encounter for other preprocedural examination: Secondary | ICD-10-CM | POA: Diagnosis not present

## 2020-10-18 DIAGNOSIS — H25812 Combined forms of age-related cataract, left eye: Secondary | ICD-10-CM | POA: Diagnosis not present

## 2020-11-02 DIAGNOSIS — H2512 Age-related nuclear cataract, left eye: Secondary | ICD-10-CM | POA: Diagnosis not present

## 2020-11-02 DIAGNOSIS — H25812 Combined forms of age-related cataract, left eye: Secondary | ICD-10-CM | POA: Diagnosis not present

## 2020-11-14 ENCOUNTER — Other Ambulatory Visit: Payer: Self-pay | Admitting: Family Medicine

## 2020-11-16 DIAGNOSIS — H25811 Combined forms of age-related cataract, right eye: Secondary | ICD-10-CM | POA: Diagnosis not present

## 2020-11-16 DIAGNOSIS — H2511 Age-related nuclear cataract, right eye: Secondary | ICD-10-CM | POA: Diagnosis not present

## 2021-05-25 ENCOUNTER — Encounter: Payer: Self-pay | Admitting: Adult Health

## 2021-05-30 ENCOUNTER — Encounter: Payer: Self-pay | Admitting: Adult Health

## 2021-05-30 ENCOUNTER — Telehealth: Payer: Self-pay | Admitting: Family Medicine

## 2021-05-30 NOTE — Telephone Encounter (Signed)
I spoke to patient and she will call back and schedule Medicare Annual Wellness Visit (AWV) in office.   If not able to come in office, please offer to do virtually or by telephone.  Left office number and my jabber [phone].  Last AWV:06/09/2010  Please schedule at anytime with Nurse Health Advisor.

## 2021-07-07 ENCOUNTER — Other Ambulatory Visit: Payer: Self-pay

## 2021-07-07 ENCOUNTER — Ambulatory Visit (INDEPENDENT_AMBULATORY_CARE_PROVIDER_SITE_OTHER): Payer: Medicare HMO

## 2021-07-07 VITALS — Ht 68.5 in | Wt 239.0 lb

## 2021-07-07 DIAGNOSIS — Z0000 Encounter for general adult medical examination without abnormal findings: Secondary | ICD-10-CM

## 2021-07-07 NOTE — Progress Notes (Signed)
Subjective:   Diane Paul is a 81 y.o. female who presents for an Initial Medicare Annual Wellness Visit. Virtual Visit via Telephone Note  I connected with  Diane Paul on 07/07/21 at  9:00 AM EST by telephone and verified that I am speaking with the correct person using two identifiers.  Location: Patient: HOME Provider: [facility] Persons participating in the virtual visit: patient/Nurse Health Advisor   I discussed the limitations, risks, security and privacy concerns of performing an evaluation and management service by telephone and the availability of in person appointments. The patient expressed understanding and agreed to proceed.  Interactive audio and video telecommunications were attempted between this nurse and patient, however failed, due to patient having technical difficulties OR patient did not have access to video capability.  We continued and completed visit with audio only.  Some vital signs may be absent or patient reported.   Chriss Driver, LPN  Review of Systems     Cardiac Risk Factors include: advanced age (>54men, >41 women);hypertension;sedentary lifestyle;obesity (BMI >30kg/m2);Other (see comment), Risk factor comments: Osteopenia     Objective:    Today's Vitals   07/07/21 0854 07/07/21 0856  Weight: 239 lb (108.4 kg)   Height: 5' 8.5" (1.74 m)   PainSc:  1    Body mass index is 35.81 kg/m.  Advanced Directives 07/07/2021 05/25/2017 07/02/2014 06/12/2011 06/01/2011  Does Patient Have a Medical Advance Directive? No No No Patient does not have advance directive;Patient has advance directive, copy in chart Patient does not have advance directive;Patient would like information  Would patient like information on creating a medical advance directive? No - Patient declined - - - Advance directive packet given  Pre-existing out of facility DNR order (yellow form or pink MOST form) - - - No -    Current Medications (verified) Outpatient Encounter  Medications as of 07/07/2021  Medication Sig   amLODipine (NORVASC) 5 MG tablet TAKE 1 TABLET (5 MG TOTAL) BY MOUTH DAILY.   B Complex-C-Folic Acid (MULTIVITAMIN, STRESS FORMULA) tablet Take 1 tablet by mouth daily. gummie bear MVT   Calcium Carbonate-Vit D-Min (CALTRATE PLUS PO) Take by mouth. 1200MG  OF CALCIUM & 800IU OF VITAMIN D   furosemide (LASIX) 40 MG tablet Take 1 tablet (40 mg total) by mouth daily as needed.   letrozole (FEMARA) 2.5 MG tablet TAKE 1 TABLET BY MOUTH DAILY.   polyethylene glycol (MIRALAX / GLYCOLAX) packet Take 17 g by mouth daily as needed.   sulfamethoxazole-trimethoprim (BACTRIM DS) 800-160 MG tablet Take 1 tablet by mouth 2 (two) times daily.   No facility-administered encounter medications on file as of 07/07/2021.    Allergies (verified) Fosamax [alendronate sodium]   History: Past Medical History:  Diagnosis Date   Anemia    Anxiety    nervous sometimes, denies panica ttacks    Arthritis    knees ?   Blood transfusion    post vaginal birth- 1960    Breast cancer ([place])    Right Breast Cancer   Cancer ([facility])    R breast cancer   Dry mouth    Hypertension    Neuromuscular disorder ([facility])    L leg, thigh- "burns sometimes"   Shortness of breath    sometimes    Use of letrozole (Femara) 05/2010   neoadjuvant femara therapy since 1/212   Past Surgical History:  Procedure Laterality Date   ABDOMINAL HYSTERECTOMY     BREAST LUMPECTOMY     BREAST SURGERY  Right   jp drains     s/p masectomy 06/11/11/ Dr. Fanny Skates, 2 jp drains right chest wall   MASTECTOMY Right    malignant   MASTECTOMY W/ SENTINEL NODE BIOPSY  06/11/2011/Right BReast   Procedure: MASTECTOMY WITH SENTINEL LYMPH NODE BIOPSY;  Surgeon: Adin Hector, MD;  Location: [facility];  Service: General;  Laterality: Right;  Right total mastectomy and sentinel lymph node biopsy using lymphatic mapping and blue dye injection.   TUBAL LIGATION     [place] FINE NEEDLE ASPIRATION WO/ W SMEAR   05/22/10   Left Breast- cyst or abscess    Family History  Problem Relation Age of Onset   Breast cancer Sister    Anesthesia problems Neg Hx    Hypotension Neg Hx    Malignant hyperthermia Neg Hx    Pseudochol deficiency Neg Hx    Social History   Socioeconomic History   Marital status: Single    Spouse name: Not on file   Number of children: 5   Years of education: Not on file   Highest education level: Not on file  Occupational History   Occupation: Retired  Tobacco Use   Smoking status: Never   Smokeless tobacco: Never  Substance and Sexual Activity   Alcohol use: No   Drug use: No   Sexual activity: Not Currently    Birth control/protection: Post-menopausal  Other Topics Concern   Not on file  Social History Narrative   Not on file   Social Determinants of Health   Financial Resource Strain: Low Risk    Difficulty of Paying Living Expenses: Not hard at all  Food Insecurity: No Food Insecurity   Worried About Charity fundraiser in the Last Year: Never true   Ran Out of Food in the Last Year: Never true  Transportation Needs: No Transportation Needs   Lack of Transportation (Medical): No   Lack of Transportation (Non-Medical): No  Physical Activity: Insufficiently Active   Days of Exercise per Week: 5 days   Minutes of Exercise per Session: 20 min  Stress: No Stress Concern Present   Feeling of Stress : Not at all  Social Connections: Moderately Integrated   Frequency of Communication with Friends and Family: More than three times a week   Frequency of Social Gatherings with Friends and Family: More than three times a week   Attends Religious Services: 1 to 4 times per year   Active Member of [employer] or Organizations: No   Attends Music therapist: 1 to 4 times per year   Marital Status: Never married    Tobacco Counseling Counseling given: Not Answered   Clinical Intake:  Pre-visit preparation completed: Yes  Pain : 0-10 Pain Score: 1   Pain Type: Acute pain Pain Location: Toe (Comment which one) Pain Descriptors / Indicators: Aching Pain Onset: 1 to 4 weeks ago Pain Frequency: Intermittent     BMI - recorded: 35.81 Nutritional Status: BMI > 30  Obese Nutritional Risks: None Diabetes: No  How often do you need to have someone help you when you read instructions, pamphlets, or other written materials from your doctor or pharmacy?: 1 - Never  Diabetic?NO  Interpreter Needed?: No  Information entered by :: mj Terry Bolotin, lpn   Activities of Daily Living In your present state of health, do you have any difficulty performing the following activities: 07/07/2021  Hearing? N  Vision? N  Difficulty concentrating or making decisions? N  Walking or climbing stairs?  N  Dressing or bathing? N  Doing errands, shopping? N  Preparing Food and eating ? N  Using the Toilet? N  In the past six months, have you accidently leaked urine? N  Do you have problems with loss of bowel control? N  Managing your Medications? N  Managing your Finances? N  Housekeeping or managing your Housekeeping? N  Some recent data might be hidden    Patient Care Team: Susy Frizzle, MD as PCP - General (Family Medicine)  Indicate any recent Medical Services you may have received from other than [facility] providers in the past year (date may be approximate).     Assessment:   This is a routine wellness examination for [place].  Hearing/Vision screen Hearing Screening - Comments:: No hearing issues.   Vision Screening - Comments:: Glaucoma surgery in 12/2020. Dr. Sharen Counter in [place].  Dietary issues and exercise activities discussed: Current Exercise Habits: Home exercise routine, Type of exercise: walking, Time (Minutes): 20, Frequency (Times/Week): 5, Weekly Exercise (Minutes/Week): 100, Intensity: Mild, Exercise limited by: cardiac condition(s)   Goals Addressed             This Visit's Progress    Exercise 3x per week (30 min  per time)       Continue to garden and walk.       Depression Screen PHQ 2/9 Scores 07/07/2021 08/12/2020  PHQ - 2 Score 0 0    Fall Risk Fall Risk  07/07/2021 08/12/2020 03/24/2018 04/11/2017 04/12/2016  Falls in the past year? 0 0 0 No No  Comment - - [facility] Telephone Survey: data to providers prior to load [employer] Telephone Survey: data to providers prior to load [facility] Telephone Survey: data to providers prior to load  Number falls in past yr: 0 - - - -  Injury with Fall? 0 - - - -  Risk for fall due to : No Fall Risks Impaired balance/gait - - -  Follow up Falls prevention discussed Falls evaluation completed - - -    FALL RISK PREVENTION PERTAINING TO THE HOME:  Any stairs in or around the home? Yes  If so, are there any without handrails? No  Home free of loose throw rugs in walkways, pet beds, electrical cords, etc? Yes  Adequate lighting in your home to reduce risk of falls? Yes   ASSISTIVE DEVICES UTILIZED TO PREVENT FALLS:  Life alert? No  Use of a cane, walker or w/c? No  Grab bars in the bathroom? No  Shower chair or bench in shower? Yes  Elevated toilet seat or a handicapped toilet? Yes   TIMED UP AND GO:  Was the test performed? No .  Phone visit.  Cognitive Function:     6CIT Screen 07/07/2021  What Year? 0 points  What month? 0 points  What time? 0 points  Count back from 20 2 points  Months in reverse 4 points  Repeat phrase 2 points  Total Score 8    Immunizations Immunization History  Administered Date(s) Administered   PFIZER(Purple Top)SARS-COV-2 Vaccination 12/20/2019, 01/10/2020   Pneumococcal Polysaccharide-23 04/22/2015    TDAP status: Due, Education has been provided regarding the importance of this vaccine. Advised may receive this vaccine at local pharmacy or Health Dept. Aware to provide a copy of the vaccination record if obtained from local pharmacy or Health Dept. Verbalized acceptance and understanding.  Flu Vaccine status: Declined,  Education has been provided regarding the importance of this vaccine but patient still declined. Advised  may receive this vaccine at local pharmacy or Health Dept. Aware to provide a copy of the vaccination record if obtained from local pharmacy or Health Dept. Verbalized acceptance and understanding.  Pneumococcal vaccine status: Due, Education has been provided regarding the importance of this vaccine. Advised may receive this vaccine at local pharmacy or Health Dept. Aware to provide a copy of the vaccination record if obtained from local pharmacy or Health Dept. Verbalized acceptance and understanding.  Covid-19 vaccine status: Completed vaccines  Qualifies for Shingles Vaccine? Yes   Zostavax completed No   Shingrix Completed?: No.    Education has been provided regarding the importance of this vaccine. Patient has been advised to call insurance company to determine out of pocket expense if they have not yet received this vaccine. Advised may also receive vaccine at local pharmacy or Health Dept. Verbalized acceptance and understanding.  Screening Tests Health Maintenance  Topic Date Due   TETANUS/TDAP  Never done   Zoster Vaccines- Shingrix (1 of 2) Never done   Pneumonia Vaccine 34+ Years old (2 - PCV) 04/21/2016   COVID-19 Vaccine (3 - Pfizer risk series) 02/07/2020   INFLUENZA VACCINE  12/05/2020   DEXA SCAN  Completed   HPV VACCINES  Aged Out    Health Maintenance  Health Maintenance Due  Topic Date Due   TETANUS/TDAP  Never done   Zoster Vaccines- Shingrix (1 of 2) Never done   Pneumonia Vaccine 64+ Years old (2 - PCV) 04/21/2016   COVID-19 Vaccine (3 - Pfizer risk series) 02/07/2020   INFLUENZA VACCINE  12/05/2020    Colorectal cancer screening: No longer required.   Mammogram status: Completed 11/04/2018. Repeat every year  Bone Density status: Completed 07/30/2012. Results reflect: Bone density results: OSTEOPENIA. Repeat every 2 years.  Lung Cancer Screening: (Low  Dose CT Chest recommended if Age 50-80 years, 30 pack-year currently smoking OR have quit w/in 15years.) does not qualify.   Additional Screening:  Hepatitis C Screening: does not qualify;   Vision Screening: Recommended annual ophthalmology exams for early detection of glaucoma and other disorders of the eye. Is the patient up to date with their annual eye exam?  Yes  Who is the provider or what is the name of the office in which the patient attends annual eye exams? Dr. Sharen Counter If pt is not established with a provider, would they like to be referred to a provider to establish care? No .   Dental Screening: Recommended annual dental exams for proper oral hygiene  Community Resource Referral / Chronic Care Management: CRR required this visit?  No   CCM required this visit?  No      Plan:     I have personally reviewed and noted the following in the patients chart:   Medical and social history Use of alcohol, tobacco or illicit drugs  Current medications and supplements including opioid prescriptions. Patient is not currently taking opioid prescriptions. Functional ability and status Nutritional status Physical activity Advanced directives List of other physicians Hospitalizations, surgeries, and ER visits in previous 12 months Vitals Screenings to include cognitive, depression, and falls Referrals and appointments  In addition, I have reviewed and discussed with patient certain preventive protocols, quality metrics, and best practice recommendations. A written personalized care plan for preventive services as well as general preventive health recommendations were provided to patient.     Chriss Driver, LPN   08/08/345   Nurse Notes: Glaucoma surgery 12/2020. Discussed mammogram and pt states she  will call to schedule repeat this year. Declined repeat DEXA. Discussed 2nd Prevnar and Shingrix and how to obtain.

## 2021-07-07 NOTE — Patient Instructions (Signed)
Diane Paul , Thank you for taking time to come for your Medicare Wellness Visit. I appreciate your ongoing commitment to your health goals. Please review the following plan we discussed and let me know if I can assist you in the future.   Screening recommendations/referrals: Colonoscopy: No longer required due to age. Mammogram: Done 11/04/2018 Repeat annually  Bone Density: Done 07/30/2012 Repeat every 2 years  Recommended yearly ophthalmology/optometry visit for glaucoma screening and checkup Recommended yearly dental visit for hygiene and checkup  Vaccinations: Influenza vaccine: Declined. Pneumococcal vaccine: Done 04/22/2015, Second dose due at your convenience.  Tdap vaccine: Due Repeat in 10 years  Shingles vaccine: Discussed.   Covid-19:Done 12/20/2019 and 01/10/2020  Advanced directives: Advance directive discussed with you today. Even though you declined this today, please call our office should you change your mind, and we can give you the proper paperwork for you to fill out.   Conditions/risks identified: KEEP UP THE GOOD WORK!!  Next appointment: Follow up in one year for your annual wellness visit 2024.   Preventive Care 81 Years and Older, Female Preventive care refers to lifestyle choices and visits with your health care provider that can promote health and wellness. What does preventive care include? A yearly physical exam. This is also called an annual well check. Dental exams once or twice a year. Routine eye exams. Ask your health care provider how often you should have your eyes checked. Personal lifestyle choices, including: Daily care of your teeth and gums. Regular physical activity. Eating a healthy diet. Avoiding tobacco and drug use. Limiting alcohol use. Practicing safe sex. Taking low-dose aspirin every day. Taking vitamin and mineral supplements as recommended by your health care provider. What happens during an annual well check? The services and  screenings done by your health care provider during your annual well check will depend on your age, overall health, lifestyle risk factors, and family history of disease. Counseling  Your health care provider may ask you questions about your: Alcohol use. Tobacco use. Drug use. Emotional well-being. Home and relationship well-being. Sexual activity. Eating habits. History of falls. Memory and ability to understand (cognition). Work and work Statistician. Reproductive health. Screening  You may have the following tests or measurements: Height, weight, and BMI. Blood pressure. Lipid and cholesterol levels. These may be checked every 5 years, or more frequently if you are over 81 years old. Skin check. Lung cancer screening. You may have this screening every year starting at age 81 if you have a 30-pack-year history of smoking and currently smoke or have quit within the past 15 years. Fecal occult blood test (FOBT) of the stool. You may have this test every year starting at age 81. Flexible sigmoidoscopy or colonoscopy. You may have a sigmoidoscopy every 5 years or a colonoscopy every 10 years starting at age 81. Hepatitis C blood test. Hepatitis B blood test. Sexually transmitted disease (STD) testing. Diabetes screening. This is done by checking your blood sugar (glucose) after you have not eaten for a while (fasting). You may have this done every 1-3 years. Bone density scan. This is done to screen for osteoporosis. You may have this done starting at age 81. Mammogram. This may be done every 1-2 years. Talk to your health care provider about how often you should have regular mammograms. Talk with your health care provider about your test results, treatment options, and if necessary, the need for more tests. Vaccines  Your health care provider may recommend certain vaccines, such  as: Influenza vaccine. This is recommended every year. Tetanus, diphtheria, and acellular pertussis (Tdap,  Td) vaccine. You may need a Td booster every 10 years. Zoster vaccine. You may need this after age 64. Pneumococcal 13-valent conjugate (PCV13) vaccine. One dose is recommended after age 81. Pneumococcal polysaccharide (PPSV23) vaccine. One dose is recommended after age 81. Talk to your health care provider about which screenings and vaccines you need and how often you need them. This information is not intended to replace advice given to you by your health care provider. Make sure you discuss any questions you have with your health care provider. Document Released: 05/20/2015 Document Revised: 01/11/2016 Document Reviewed: 02/22/2015 Elsevier Interactive Patient Education  2017 [place] Prevention in the Home Falls can cause injuries. They can happen to people of all ages. There are many things you can do to make your home safe and to help prevent falls. What can I do on the outside of my home? Regularly fix the edges of walkways and driveways and fix any cracks. Remove anything that might make you trip as you walk through a door, such as a raised step or threshold. Trim any bushes or trees on the path to your home. Use bright outdoor lighting. Clear any walking paths of anything that might make someone trip, such as rocks or tools. Regularly check to see if handrails are loose or broken. Make sure that both sides of any steps have handrails. Any raised decks and porches should have guardrails on the edges. Have any leaves, snow, or ice cleared regularly. Use sand or salt on walking paths during winter. Clean up any spills in your garage right away. This includes oil or grease spills. What can I do in the bathroom? Use night lights. Install grab bars by the toilet and in the tub and shower. Do not use towel bars as grab bars. Use non-skid mats or decals in the tub or shower. If you need to sit down in the shower, use a plastic, non-slip stool. Keep the floor dry. Clean up any  water that spills on the floor as soon as it happens. Remove soap buildup in the tub or shower regularly. Attach bath mats securely with double-sided non-slip rug tape. Do not have throw rugs and other things on the floor that can make you trip. What can I do in the bedroom? Use night lights. Make sure that you have a light by your bed that is easy to reach. Do not use any sheets or blankets that are too big for your bed. They should not hang down onto the floor. Have a firm chair that has side arms. You can use this for support while you get dressed. Do not have throw rugs and other things on the floor that can make you trip. What can I do in the kitchen? Clean up any spills right away. Avoid walking on wet floors. Keep items that you use a lot in easy-to-reach places. If you need to reach something above you, use a strong step stool that has a grab bar. Keep electrical cords out of the way. Do not use floor polish or wax that makes floors slippery. If you must use wax, use non-skid floor wax. Do not have throw rugs and other things on the floor that can make you trip. What can I do with my stairs? Do not leave any items on the stairs. Make sure that there are handrails on both sides of the stairs and use  them. Fix handrails that are broken or loose. Make sure that handrails are as long as the stairways. Check any carpeting to make sure that it is firmly attached to the stairs. Fix any carpet that is loose or worn. Avoid having throw rugs at the top or bottom of the stairs. If you do have throw rugs, attach them to the floor with carpet tape. Make sure that you have a light switch at the top of the stairs and the bottom of the stairs. If you do not have them, ask someone to add them for you. What else can I do to help prevent falls? Wear shoes that: Do not have high heels. Have rubber bottoms. Are comfortable and fit you well. Are closed at the toe. Do not wear sandals. If you use a  stepladder: Make sure that it is fully opened. Do not climb a closed stepladder. Make sure that both sides of the stepladder are locked into place. Ask someone to hold it for you, if possible. Clearly mark and make sure that you can see: Any grab bars or handrails. First and last steps. Where the edge of each step is. Use tools that help you move around (mobility aids) if they are needed. These include: Canes. Walkers. Scooters. Crutches. Turn on the lights when you go into a dark area. Replace any light bulbs as soon as they burn out. Set up your furniture so you have a clear path. Avoid moving your furniture around. If any of your floors are uneven, fix them. If there are any pets around you, be aware of where they are. Review your medicines with your doctor. Some medicines can make you feel dizzy. This can increase your chance of falling. Ask your doctor what other things that you can do to help prevent falls. This information is not intended to replace advice given to you by your health care provider. Make sure you discuss any questions you have with your health care provider. Document Released: 02/17/2009 Document Revised: 09/29/2015 Document Reviewed: 05/28/2014 Elsevier Interactive Patient Education  2017 [employer].

## 2021-09-17 DIAGNOSIS — Z0130 Encounter for examination of blood pressure without abnormal findings: Secondary | ICD-10-CM | POA: Diagnosis not present

## 2021-09-17 DIAGNOSIS — J329 Chronic sinusitis, unspecified: Secondary | ICD-10-CM | POA: Diagnosis not present

## 2021-09-17 DIAGNOSIS — R0981 Nasal congestion: Secondary | ICD-10-CM | POA: Diagnosis not present

## 2021-12-01 ENCOUNTER — Other Ambulatory Visit: Payer: Self-pay | Admitting: Family Medicine

## 2021-12-01 NOTE — Telephone Encounter (Signed)
Attempted to call pt to schedule her 6 month check/refills appt.   Keep getting a busy signal.   Unable to leave a voicemail. Refill forwarded to [facility] for provider review.

## 2021-12-08 NOTE — Telephone Encounter (Signed)
Pt is scheduled for a med review on 8/11.

## 2021-12-08 NOTE — Telephone Encounter (Signed)
Requested Prescriptions  Pending Prescriptions Disp Refills  . amLODipine (NORVASC) 5 MG tablet [Pharmacy Med Name: AMLODIPINE BESYLATE 5 MG TAB] 6 tablet 0    Sig: TAKE 1 TABLET (5 MG TOTAL) BY MOUTH DAILY.     Cardiovascular: Calcium Channel Blockers 2 Failed - 12/08/2021 11:50 AM      Failed - Valid encounter within last 6 months    Recent Outpatient Visits          1 year ago Acute gout involving toe of left foot, unspecified cause   [place] Dennard Schaumann, Cammie Mcgee, MD   1 year ago Acute gout involving toe of left foot, unspecified cause   [place] Susy Frizzle, MD   4 years ago Acute left ankle pain   [place] Dena Billet B, PA-C   5 years ago Benign essential HTN   [place], Cammie Mcgee, MD   6 years ago Essential hypertension   [place], Cammie Mcgee, MD      Future Appointments            In 1 week Dennard Schaumann, Cammie Mcgee, MD [place]           Passed - Last BP in normal range    BP Readings from Last 1 Encounters:  08/12/20 132/64         Passed - Last Heart Rate in normal range    Pulse Readings from Last 1 Encounters:  08/12/20 84

## 2021-12-15 ENCOUNTER — Ambulatory Visit (INDEPENDENT_AMBULATORY_CARE_PROVIDER_SITE_OTHER): Payer: Medicare HMO | Admitting: Family Medicine

## 2021-12-15 VITALS — BP 152/84 | HR 88 | Temp 97.9°F | Resp 16 | Wt 229.0 lb

## 2021-12-15 DIAGNOSIS — I10 Essential (primary) hypertension: Secondary | ICD-10-CM

## 2021-12-15 MED ORDER — LEVOCETIRIZINE DIHYDROCHLORIDE 5 MG PO TABS: 5.0000 mg | ORAL_TABLET | Freq: Every evening | ORAL | 0 refills | Status: DC

## 2021-12-15 NOTE — Progress Notes (Signed)
Subjective:    Patient ID: Diane Paul, female    DOB: 12/22/1940, 81 y.o.   MRN: [id number]  HPI Patient is here today to recheck her blood pressure.  Her blood pressure is elevated at 152/84.  She denies any chest pain shortness of breath or dyspnea on exertion.  She does report some postnasal drip and drainage.  She was recently treated for sinus infection in the urgent care.  Otherwise she is doing well.  She denies any nausea vomiting or abdominal pain.  She denies any melena or hematochezia.  She denies any hematuria or dysuria. Past Medical History:  Diagnosis Date   Anemia    Anxiety    nervous sometimes, denies panica ttacks    Arthritis    knees ?   Blood transfusion    post vaginal birth- 1960    Breast cancer ([place])    Right Breast Cancer   Cancer (HCC)    R breast cancer   Dry mouth    Hypertension    Neuromuscular disorder (HCC)    L leg, thigh- "burns sometimes"   Shortness of breath    sometimes    Use of letrozole (Femara) 05/2010   neoadjuvant femara therapy since 1/212   Past Surgical History:  Procedure Laterality Date   ABDOMINAL HYSTERECTOMY     BREAST LUMPECTOMY     BREAST SURGERY     Right   jp drains     s/p masectomy 06/11/11/ Dr. Fanny Skates, 2 jp drains right chest wall   MASTECTOMY Right    malignant   MASTECTOMY W/ SENTINEL NODE BIOPSY  06/11/2011/Right BReast   Procedure: MASTECTOMY WITH SENTINEL LYMPH NODE BIOPSY;  Surgeon: Adin Hector, MD;  Location: [facility];  Service: General;  Laterality: Right;  Right total mastectomy and sentinel lymph node biopsy using lymphatic mapping and blue dye injection.   TUBAL LIGATION     [place] FINE NEEDLE ASPIRATION WO/ W SMEAR  05/22/10   Left Breast- cyst or abscess    Current Outpatient Medications on File Prior to Visit  Medication Sig Dispense Refill   amLODipine (NORVASC) 5 MG tablet TAKE 1 TABLET (5 MG TOTAL) BY MOUTH DAILY. 6 tablet 0   Calcium Carbonate-Vit D-Min (CALTRATE PLUS PO) Take by  mouth. '1200MG'$  OF CALCIUM & 800IU OF VITAMIN D     furosemide (LASIX) 40 MG tablet Take 1 tablet (40 mg total) by mouth daily as needed. 30 tablet 3   B Complex-C-Folic Acid (MULTIVITAMIN, STRESS FORMULA) tablet Take 1 tablet by mouth daily. gummie bear MVT     No current facility-administered medications on file prior to visit.   Allergies  Allergen Reactions   Fosamax [Alendronate Sodium] Other (See Comments)    Aches and pains   Social History   Socioeconomic History   Marital status: Single    Spouse name: Not on file   Number of children: 5   Years of education: Not on file   Highest education level: Not on file  Occupational History   Occupation: Retired  Tobacco Use   Smoking status: Never   Smokeless tobacco: Never  Substance and Sexual Activity   Alcohol use: No   Drug use: No   Sexual activity: Not Currently    Birth control/protection: Post-menopausal  Other Topics Concern   Not on file  Social History Narrative   Not on file   Social Determinants of Health   Financial Resource Strain: Low Risk  (07/07/2021)   Overall  Financial Resource Strain (CARDIA)    Difficulty of Paying Living Expenses: Not hard at all  Food Insecurity: No Food Insecurity (07/07/2021)   Hunger Vital Sign    Worried About Running Out of Food in the Last Year: Never true    Ran Out of Food in the Last Year: Never true  Transportation Needs: No Transportation Needs (07/07/2021)   PRAPARE - Hydrologist (Medical): No    Lack of Transportation (Non-Medical): No  Physical Activity: Insufficiently Active (07/07/2021)   Exercise Vital Sign    Days of Exercise per Week: 5 days    Minutes of Exercise per Session: 20 min  Stress: No Stress Concern Present (07/07/2021)   [place]    Feeling of Stress : Not at all  Social Connections: Moderately Integrated (07/07/2021)   Social Connection and Isolation Panel  [NHANES]    Frequency of Communication with Friends and Family: More than three times a week    Frequency of Social Gatherings with Friends and Family: More than three times a week    Attends Religious Services: 1 to 4 times per year    Active Member of [employer] or Organizations: No    Attends Archivist Meetings: 1 to 4 times per year    Marital Status: Never married  Intimate Partner Violence: Not At Risk (07/07/2021)   Humiliation, Afraid, Rape, and Kick questionnaire    Fear of Current or Ex-Partner: No    Emotionally Abused: No    Physically Abused: No    Sexually Abused: No      Review of Systems  All other systems reviewed and are negative.      Objective:   Physical Exam Vitals reviewed.  Constitutional:      General: She is not in acute distress.    Appearance: She is obese. She is not ill-appearing, toxic-appearing or diaphoretic.  HENT:     Right Ear: Tympanic membrane and ear canal normal.     Left Ear: Tympanic membrane and ear canal normal.     Nose: Rhinorrhea present. No congestion.  Neck:     Vascular: No carotid bruit.  Cardiovascular:     Rate and Rhythm: Normal rate and regular rhythm.     Heart sounds: Normal heart sounds. No murmur heard.    No friction rub. No gallop.  Pulmonary:     Effort: Pulmonary effort is normal. No respiratory distress.     Breath sounds: Normal breath sounds. No wheezing, rhonchi or rales.  Abdominal:     General: Bowel sounds are normal.     Palpations: Abdomen is soft.  Musculoskeletal:     Right lower leg: No edema.     Left lower leg: No edema.  Neurological:     Mental Status: She is alert.           Assessment & Plan:  Primary hypertension - Plan: CBC with Differential/Platelet, Lipid panel, COMPLETE METABOLIC PANEL WITH GFR Patient's blood pressure today is elevated.  Recommended increasing amlodipine to 10 mg a day and then rechecking blood pressure in 2 weeks.  Check CBC CMP and a fasting lipid  panel.  I will give the patient Xyzal 5 mg daily for postnasal drip.

## 2021-12-16 LAB — COMPLETE METABOLIC PANEL WITH GFR
AG Ratio: 1.5 (calc) (ref 1.0–2.5)
ALT: 14 U/L (ref 6–29)
AST: 17 U/L (ref 10–35)
Albumin: 4.1 g/dL (ref 3.6–5.1)
Alkaline phosphatase (APISO): 72 U/L (ref 37–153)
BUN/Creatinine Ratio: 15 (calc) (ref 6–22)
BUN: 18 mg/dL (ref 7–25)
CO2: 27 mmol/L (ref 20–32)
Calcium: 9.3 mg/dL (ref 8.6–10.4)
Chloride: 105 mmol/L (ref 98–110)
Creat: 1.23 mg/dL — ABNORMAL HIGH (ref 0.60–0.95)
Globulin: 2.7 g/dL (calc) (ref 1.9–3.7)
Glucose, Bld: 106 mg/dL — ABNORMAL HIGH (ref 65–99)
Potassium: 4.4 mmol/L (ref 3.5–5.3)
Sodium: 140 mmol/L (ref 135–146)
Total Bilirubin: 0.3 mg/dL (ref 0.2–1.2)
Total Protein: 6.8 g/dL (ref 6.1–8.1)
eGFR: 44 mL/min/{1.73_m2} — ABNORMAL LOW (ref >=60)

## 2021-12-16 LAB — LIPID PANEL
Cholesterol: 227 mg/dL — ABNORMAL HIGH (ref <200)
HDL: 45 mg/dL — ABNORMAL LOW (ref >=50)
LDL Cholesterol (Calc): 167 mg/dL (calc) — ABNORMAL HIGH
Non-HDL Cholesterol (Calc): 182 mg/dL (calc) — ABNORMAL HIGH (ref <130)
Total CHOL/HDL Ratio: 5 (calc) — ABNORMAL HIGH (ref <5.0)
Triglycerides: 58 mg/dL (ref <150)

## 2021-12-16 LAB — CBC WITH DIFFERENTIAL/PLATELET
Absolute Monocytes: 583 cells/uL (ref 200–950)
Basophils Absolute: 50 cells/uL (ref 0–200)
Basophils Relative: 0.9 %
Eosinophils Absolute: 413 cells/uL (ref 15–500)
Eosinophils Relative: 7.5 %
HCT: 38.2 % (ref 35.0–45.0)
Hemoglobin: 12.3 g/dL (ref 11.7–15.5)
Lymphs Abs: 1815 cells/uL (ref 850–3900)
MCH: 26.3 pg — ABNORMAL LOW (ref 27.0–33.0)
MCHC: 32.2 g/dL (ref 32.0–36.0)
MCV: 81.6 fL (ref 80.0–100.0)
MPV: 10.3 fL (ref 7.5–12.5)
Monocytes Relative: 10.6 %
Neutro Abs: 2640 cells/uL (ref 1500–7800)
Neutrophils Relative %: 48 %
Platelets: 270 10*3/uL (ref 140–400)
RBC: 4.68 10*6/uL (ref 3.80–5.10)
RDW: 13.2 % (ref 11.0–15.0)
Total Lymphocyte: 33 %
WBC: 5.5 10*3/uL (ref 3.8–10.8)

## 2021-12-19 ENCOUNTER — Other Ambulatory Visit: Payer: Self-pay

## 2021-12-19 DIAGNOSIS — E7800 Pure hypercholesterolemia, unspecified: Secondary | ICD-10-CM

## 2021-12-19 MED ORDER — EZETIMIBE 10 MG PO TABS: 10.0000 mg | ORAL_TABLET | Freq: Every day | ORAL | 3 refills | Status: DC

## 2022-01-03 ENCOUNTER — Other Ambulatory Visit: Payer: Self-pay | Admitting: Family Medicine

## 2022-01-04 NOTE — Telephone Encounter (Signed)
Requested medication (s) are due for refill today:yes  Requested medication (s) are on the active medication list: yes  Last refill:  12/08/21  Future visit scheduled:no  Notes to clinic:  Unable to refill per protocol, last refill by another 12/08/21 for 6 tablets. Last OV 12/15/21 provider suggested increase in medication, routing for review.   Requested Prescriptions  Pending Prescriptions Disp Refills   amLODipine (NORVASC) 5 MG tablet [Pharmacy Med Name: AMLODIPINE BESYLATE 5 MG TAB] 6 tablet 0    Sig: TAKE 1 TABLET (5 MG TOTAL) BY MOUTH DAILY.     Cardiovascular: Calcium Channel Blockers 2 Failed - 01/03/2022  7:35 PM      Failed - Last BP in normal range    BP Readings from Last 1 Encounters:  12/15/21 (!) 152/84         Failed - Valid encounter within last 6 months    Recent Outpatient Visits           1 year ago Acute gout involving toe of left foot, unspecified cause   [facility] Susy Frizzle, MD   1 year ago Acute gout involving toe of left foot, unspecified cause   [place] Susy Frizzle, MD   4 years ago Acute left ankle pain   [place] Dena Billet B, PA-C   5 years ago Benign essential HTN   [place] Pick[place], Cammie Mcgee, MD   6 years ago Essential hypertension   [place], Cammie Mcgee, MD              Passed - Last Heart Rate in normal range    Pulse Readings from Last 1 Encounters:  12/15/21 88

## 2022-01-16 ENCOUNTER — Other Ambulatory Visit: Payer: Self-pay | Admitting: Family Medicine

## 2022-01-17 NOTE — Telephone Encounter (Signed)
/  Requested medication (s) are due for refill today: yes  Requested medication (s) are on the active medication list: yes  Last refill:  01/05/22  Future visit scheduled: yes / Notes to clinic:  Unable to refill per protocol, in last OV  12/15/21 pt was instructed to increase medication to 10 mg. Medication was refilled 01/05/22 for 6 tab. Dose pt need refill for 5 mg or new prescription of 10 mg, routing for review.     Requested Prescriptions  Pending Prescriptions Disp Refills   amLODipine (NORVASC) 5 MG tablet [Pharmacy Med Name: AMLODIPINE BESYLATE 5 MG TAB] 6 tablet 0    Sig: TAKE 1 TABLET (5 MG TOTAL) BY MOUTH DAILY.     Cardiovascular: Calcium Channel Blockers 2 Failed - 01/16/2022  2:47 PM      Failed - Last BP in normal range    BP Readings from Last 1 Encounters:  12/15/21 (!) 152/84         Failed - Valid encounter within last 6 months    Recent Outpatient Visits           1 year ago Acute gout involving toe of left foot, unspecified cause   [place] Susy Frizzle, MD   1 year ago Acute gout involving toe of left foot, unspecified cause   [place] Susy Frizzle, MD   4 years ago Acute left ankle pain   [place] Dena Billet B, PA-C   5 years ago Benign essential HTN   [place] Pickard, Cammie Mcgee, MD   6 years ago Essential hypertension   [place], Cammie Mcgee, MD              Passed - Last Heart Rate in normal range    Pulse Readings from Last 1 Encounters:  12/15/21 88

## 2022-01-18 ENCOUNTER — Other Ambulatory Visit: Payer: Self-pay

## 2022-01-18 ENCOUNTER — Telehealth: Payer: Self-pay

## 2022-01-18 DIAGNOSIS — I10 Essential (primary) hypertension: Secondary | ICD-10-CM

## 2022-01-18 MED ORDER — AMLODIPINE BESYLATE 10 MG PO TABS: 10.0000 mg | ORAL_TABLET | Freq: Once | ORAL | Status: DC

## 2022-01-18 NOTE — Telephone Encounter (Signed)
Pt called in to inquire about this med amLODipine (NORVASC) 5 MG tablet [[id number]]. Pt states that pharmacy only dispensed 6 pills and pt is now completely out of this med. Pt would like a cb to confirm that she is supposed to continue to take this med please.  LOV: 12/15/21 (follow up/discuss meds)  Pharmacy: [facility]  CB: [phone]

## 2022-01-19 ENCOUNTER — Other Ambulatory Visit: Payer: Self-pay

## 2022-01-19 DIAGNOSIS — I10 Essential (primary) hypertension: Secondary | ICD-10-CM

## 2022-01-19 MED ORDER — AMLODIPINE BESYLATE 5 MG PO TABS: ORAL_TABLET | ORAL | 3 refills | Status: DC

## 2022-07-10 ENCOUNTER — Telehealth: Payer: Self-pay | Admitting: Family Medicine

## 2022-07-10 NOTE — Telephone Encounter (Signed)
Outbound call placed to switch AWV appt from NHV's schedule to NP's schedule. Phone rang out; no answer or voicemail option.

## 2022-07-19 ENCOUNTER — Other Ambulatory Visit: Payer: Self-pay | Admitting: Family Medicine

## 2022-07-19 DIAGNOSIS — I10 Essential (primary) hypertension: Secondary | ICD-10-CM

## 2022-07-25 ENCOUNTER — Encounter: Payer: Self-pay | Admitting: Family Medicine

## 2022-07-25 ENCOUNTER — Ambulatory Visit (INDEPENDENT_AMBULATORY_CARE_PROVIDER_SITE_OTHER): Payer: Medicare HMO | Admitting: Family Medicine

## 2022-07-25 DIAGNOSIS — Z0000 Encounter for general adult medical examination without abnormal findings: Secondary | ICD-10-CM

## 2022-07-25 NOTE — Progress Notes (Signed)
Subjective:   Diane Paul is a 82 y.o. female who presents for Medicare Annual (Subsequent) preventive examination.  Review of Systems     Cardiac Risk Factors include: advanced age (>88men, >56 women)     Objective:    There were no vitals filed for this visit. There is no height or weight on file to calculate BMI.     07/07/2021    9:01 AM 05/25/2017    7:16 PM 07/02/2014    9:51 AM 06/12/2011    2:39 AM 06/01/2011   12:49 PM  Advanced Directives  Does Patient Have a Medical Advance Directive? No No No Patient does not have advance directive;Patient has advance directive, copy in chart Patient does not have advance directive;Patient would like information  Would patient like information on creating a medical advance directive? No - Patient declined    Advance directive packet given  Pre-existing out of facility DNR order (yellow form or pink MOST form)    No     Current Medications (verified) Outpatient Encounter Medications as of 07/25/2022  Medication Sig   amLODipine (NORVASC) 5 MG tablet TAKE 2 TABLETS BY MOUTH EVERY DAY EQUAL 10MG  PER DAY   B Complex-C-Folic Acid (MULTIVITAMIN, STRESS FORMULA) tablet Take 1 tablet by mouth daily. gummie bear MVT   Calcium Carbonate-Vit D-Min (CALTRATE PLUS PO) Take by mouth. 1200MG  OF CALCIUM & 800IU OF VITAMIN D   ezetimibe (ZETIA) 10 MG tablet Take 1 tablet (10 mg total) by mouth daily.   furosemide (LASIX) 40 MG tablet Take 1 tablet (40 mg total) by mouth daily as needed.   levocetirizine (XYZAL ALLERGY 24HR) 5 MG tablet Take 1 tablet (5 mg total) by mouth every evening.   No facility-administered encounter medications on file as of 07/25/2022.    Allergies (verified) Fosamax [alendronate sodium]   History: Past Medical History:  Diagnosis Date   Anemia    Anxiety    nervous sometimes, denies panica ttacks    Arthritis    knees ?   Blood transfusion    post vaginal birth- 1960    Breast cancer ([place])    Right Breast Cancer    Cancer (HCC)    R breast cancer   Dry mouth    Hypertension    Neuromuscular disorder (HCC)    L leg, thigh- "burns sometimes"   Shortness of breath    sometimes    Use of letrozole (Femara) 05/2010   neoadjuvant femara therapy since 1/212   Past Surgical History:  Procedure Laterality Date   ABDOMINAL HYSTERECTOMY     BREAST LUMPECTOMY     BREAST SURGERY     Right   jp drains     s/p masectomy 06/11/11/ Dr. Fanny Skates, 2 jp drains right chest wall   MASTECTOMY Right    malignant   MASTECTOMY W/ SENTINEL NODE BIOPSY  06/11/2011/Right BReast   Procedure: MASTECTOMY WITH SENTINEL LYMPH NODE BIOPSY;  Surgeon: Adin Hector, MD;  Location: [facility];  Service: General;  Laterality: Right;  Right total mastectomy and sentinel lymph node biopsy using lymphatic mapping and blue dye injection.   TUBAL LIGATION     [place] FINE NEEDLE ASPIRATION WO/ W SMEAR  05/22/10   Left Breast- cyst or abscess    Family History  Problem Relation Age of Onset   Breast cancer Sister    Anesthesia problems Neg Hx    Hypotension Neg Hx    Malignant hyperthermia Neg Hx    Pseudochol deficiency  Neg Hx    Social History   Socioeconomic History   Marital status: Single    Spouse name: Not on file   Number of children: 5   Years of education: Not on file   Highest education level: Not on file  Occupational History   Occupation: Retired  Tobacco Use   Smoking status: Never   Smokeless tobacco: Never  Substance and Sexual Activity   Alcohol use: No   Drug use: No   Sexual activity: Not Currently    Birth control/protection: Post-menopausal  Other Topics Concern   Not on file  Social History Narrative   Not on file   Social Determinants of Health   Financial Resource Strain: Low Risk  (07/25/2022)   Overall Financial Resource Strain (CARDIA)    Difficulty of Paying Living Expenses: Not hard at all  Food Insecurity: No Food Insecurity (07/25/2022)   Hunger Vital Sign    Worried About Running  Out of Food in the Last Year: Never true    [place] in the Last Year: Never true  Transportation Needs: No Transportation Needs (07/25/2022)   PRAPARE - Hydrologist (Medical): No    Lack of Transportation (Non-Medical): No  Physical Activity: Sufficiently Active (07/25/2022)   Exercise Vital Sign    Days of Exercise per Week: 7 days    Minutes of Exercise per Session: 150+ min  Stress: No Stress Concern Present (07/25/2022)   [place]    Feeling of Stress : Not at all  Social Connections: Socially Isolated (07/25/2022)   Social Connection and Isolation Panel [NHANES]    Frequency of Communication with Friends and Family: More than three times a week    Frequency of Social Gatherings with Friends and Family: More than three times a week    Attends Religious Services: Never    Marine scientist or Organizations: No    Attends Music therapist: Never    Marital Status: Divorced    Tobacco Counseling Counseling given: Not Answered   Clinical Intake:  Pre-visit preparation completed: Yes  Pain : No/denies pain     Diabetes: No     Diabetic?no         Activities of Daily Living    07/25/2022    1:46 PM  In your present state of health, do you have any difficulty performing the following activities:  Hearing? 0  Vision? 0  Difficulty concentrating or making decisions? 0  Walking or climbing stairs? 0  Dressing or bathing? 0  Doing errands, shopping? 0  Preparing Food and eating ? N  Using the Toilet? N  In the past six months, have you accidently leaked urine? N  Do you have problems with loss of bowel control? N  Managing your Medications? N  Managing your Finances? N  Housekeeping or managing your Housekeeping? N    Patient Care Team: Susy Frizzle, MD as PCP - General (Family Medicine)  Indicate any recent Medical Services you may  have received from other than [facility] providers in the past year (date may be approximate).     Assessment:   This is a routine wellness examination for [name].  Hearing/Vision screen No results found.  Dietary issues and exercise activities discussed: Current Exercise Habits: The patient has a physically strenuous job, but has no regular exercise apart from work., Exercise limited by: None identified   Goals Addressed  This Visit's Progress    Exercise 3x per week (30 min per time)   Not on track    Continue to garden and walk.       Depression Screen    07/25/2022    1:45 PM 07/25/2022    1:44 PM 07/07/2021    8:58 AM 08/12/2020    2:45 PM  PHQ 2/9 Scores  PHQ - 2 Score 0 0 0 0    Fall Risk    07/25/2022    1:45 PM 07/07/2021    9:02 AM 08/12/2020    2:45 PM 03/24/2018    1:36 PM 04/11/2017    2:54 PM  [place] in the past year? 0 0 0 0 No  Comment    [employer] Telephone Survey: data to providers prior to load [employer] Survey: data to providers prior to load  Number falls in past yr: 0 0     Injury with Fall? 0 0     Risk for fall due to :  No Fall Risks Impaired balance/gait    Follow up  Falls prevention discussed Falls evaluation completed      [place]:  Any stairs in or around the home? No  If so, are there any without handrails? Yes  Home free of loose throw rugs in walkways, pet beds, electrical cords, etc? Yes  Adequate lighting in your home to reduce risk of falls? Yes   ASSISTIVE DEVICES UTILIZED TO PREVENT FALLS:  Life alert? No  Use of a cane, walker or w/c? No  Grab bars in the bathroom? No  Shower chair or bench in shower? No  Elevated toilet seat or a handicapped toilet? Yes   TIMED UP AND GO:  Was the test performed? No .  Length of time to ambulate 10 feet: 0 sec.   Cognitive Function:        07/25/2022    1:47 PM 07/07/2021    9:04 AM  6CIT Screen  What Year? 0 points 0 points   What month? 0 points 0 points  What time? 0 points 0 points  Count back from 20 4 points 2 points  Months in reverse 4 points 4 points  Repeat phrase 0 points 2 points  Total Score 8 points 8 points    Immunizations Immunization History  Administered Date(s) Administered   PFIZER(Purple Top)SARS-COV-2 Vaccination 12/20/2019, 01/10/2020   Pneumococcal Polysaccharide-23 04/22/2015    TDAP status: Due, Education has been provided regarding the importance of this vaccine. Advised may receive this vaccine at local pharmacy or Health Dept. Aware to provide a copy of the vaccination record if obtained from local pharmacy or Health Dept. Verbalized acceptance and understanding.  Flu Vaccine status: Due, Education has been provided regarding the importance of this vaccine. Advised may receive this vaccine at local pharmacy or Health Dept. Aware to provide a copy of the vaccination record if obtained from local pharmacy or Health Dept. Verbalized acceptance and understanding.  Pneumococcal vaccine status: Due, Education has been provided regarding the importance of this vaccine. Advised may receive this vaccine at local pharmacy or Health Dept. Aware to provide a copy of the vaccination record if obtained from local pharmacy or Health Dept. Verbalized acceptance and understanding.  Covid-19 vaccine status: Information provided on how to obtain vaccines.   Qualifies for Shingles Vaccine? Yes   Zostavax completed No   Shingrix Completed?: No.    Education has been provided regarding  the importance of this vaccine. Patient has been advised to call insurance company to determine out of pocket expense if they have not yet received this vaccine. Advised may also receive vaccine at local pharmacy or Health Dept. Verbalized acceptance and understanding.  Screening Tests Health Maintenance  Topic Date Due   DTaP/Tdap/Td (1 - Tdap) Never done   Zoster Vaccines- Shingrix (1 of 2) Never done   Pneumonia  Vaccine 68+ Years old (2 of 2 - PCV) 04/21/2016   INFLUENZA VACCINE  12/05/2021   Medicare Annual Wellness (AWV)  07/08/2022   DEXA SCAN  Completed   HPV VACCINES  Aged Out   COVID-19 Vaccine  Discontinued    Health Maintenance  Health Maintenance Due  Topic Date Due   DTaP/Tdap/Td (1 - Tdap) Never done   Zoster Vaccines- Shingrix (1 of 2) Never done   Pneumonia Vaccine 27+ Years old (2 of 2 - PCV) 04/21/2016   INFLUENZA VACCINE  12/05/2021   Medicare Annual Wellness (AWV)  07/08/2022    Colorectal cancer screening: No longer required.   Mammogram status: Completed 2020. Repeat every year  Bone Density status: Completed 2014. Results reflect: Bone density results: OSTEOPENIA. Repeat every 2 years.  Lung Cancer Screening: (Low Dose CT Chest recommended if Age 26-80 years, 30 pack-year currently smoking OR have quit w/in 15years.) does not qualify.   Lung Cancer Screening Referral: no  Additional Screening:  Hepatitis C Screening: does not qualify; Currently age 54  Vision Screening: Recommended annual ophthalmology exams for early detection of glaucoma and other disorders of the eye. Is the patient up to date with their annual eye exam?  Yes  Who is the provider or what is the name of the office in which the patient attends annual eye exams? [facility] If pt is not established with a provider, would they like to be referred to a provider to establish care? No .   Dental Screening: Recommended annual dental exams for proper oral hygiene  Community Resource Referral / Chronic Care Management: CRR required this visit?  No   CCM required this visit?  No      Plan:     I have personally reviewed and noted the following in the patient's chart:   Medical and social history Use of alcohol, tobacco or illicit drugs  Current medications and supplements including opioid prescriptions. Patient is not currently taking opioid prescriptions. Functional ability and  status Nutritional status Physical activity Advanced directives List of other physicians Hospitalizations, surgeries, and ER visits in previous 12 months Vitals Screenings to include cognitive, depression, and falls Referrals and appointments  In addition, I have reviewed and discussed with patient certain preventive protocols, quality metrics, and best practice recommendations. A written personalized care plan for preventive services as well as general preventive health recommendations were provided to patient.     Rubie Maid, [place]   07/25/2022   Nurse Notes:

## 2022-07-25 NOTE — Patient Instructions (Signed)
Thank you for enrolling in [facility]. Please follow the instructions below to securely access your online medical record. MyChart allows you to send messages to your doctor, view your test results, manage appointments, and more.   How Do I Sign Up? In your Internet browser, go to [employer] and enter [email or web address]. Click on the Sign Up Now link in the Sign In box. You will see the New Member Sign Up page. Enter your MyChart Access Code exactly as it appears below. You will not need to use this code after you've completed the sign-up process. If you do not sign up before the expiration date, you must request a new code.  MyChart Access Code: [email or web address] Expires: 09/08/2022  1:57 PM  Enter your Social Security Number ([id number]) and Date of Birth (mm/dd/yyyy) as indicated and click Submit. You will be taken to the next sign-up page. Create a MyChart ID. This will be your MyChart login ID and cannot be changed, so think of one that is secure and easy to remember. Create a Scientist, research (physical sciences). You can change your password at any time. Enter your Password Reset Question and Answer. This can be used at a later time if you forget your password.  Enter your e-mail address. You will receive e-mail notification when new information is available in [facility]. Click Sign Up. You can now view your medical record.   Additional Information Remember, MyChart is NOT to be used for urgent needs. For medical emergencies, dial 911.

## 2022-09-26 ENCOUNTER — Telehealth: Payer: Self-pay

## 2022-09-26 NOTE — Telephone Encounter (Signed)
Pt called in stating that she has stopped taking this med ezetimibe (ZETIA) 10 MG tablet. Pt stated that she thinks this med has been affecting her vision, and causing very bad headaches. Pt stated that she recently had cataract surgery and doesn't want this med to affect the progress from this surgery. Pt states she is scheduled to see her eye dr, and would like to know if she may need to see pcp since she has stopped taking this med. Please advise.  Cb#: [phone]

## 2022-10-05 ENCOUNTER — Encounter: Payer: Self-pay | Admitting: Family Medicine

## 2022-10-05 ENCOUNTER — Ambulatory Visit (INDEPENDENT_AMBULATORY_CARE_PROVIDER_SITE_OTHER): Payer: Medicare HMO | Admitting: Family Medicine

## 2022-10-05 VITALS — BP 136/82 | HR 98 | Temp 98.1°F | Ht 68.5 in | Wt 222.0 lb

## 2022-10-05 DIAGNOSIS — H538 Other visual disturbances: Secondary | ICD-10-CM

## 2022-10-05 NOTE — Progress Notes (Signed)
Subjective:    Patient ID: Diane Paul, female    DOB: November 11, 1940, 82 y.o.   MRN: [id number]  Eye Problem   Patient recently has developed vision problems in her left eye.  She attributed to starting a cholesterol medicine called Zetia.  She states that she is having 3 constant floaters in her field of vision.  These do not go away.  They float across her field of vision constantly.  She also reports a cloudy spot in her field of vision and wavy lines.  This all occurred suddenly but after stopping the Zetia the symptoms have not improved. Past Medical History:  Diagnosis Date   Anemia    Anxiety    nervous sometimes, denies panica ttacks    Arthritis    knees ?   Blood transfusion    post vaginal birth- 1960    Breast cancer (HCC)    Right Breast Cancer   Cancer (HCC)    R breast cancer   Dry mouth    Hypertension    Neuromuscular disorder (HCC)    L leg, thigh- "burns sometimes"   Shortness of breath    sometimes    Use of letrozole (Femara) 05/2010   neoadjuvant femara therapy since 1/212   Past Surgical History:  Procedure Laterality Date   ABDOMINAL HYSTERECTOMY     BREAST LUMPECTOMY     BREAST SURGERY     Right   jp drains     s/p masectomy 06/11/11/ Dr. [name], 2 jp drains right chest wall   MASTECTOMY Right    malignant   MASTECTOMY W/ SENTINEL NODE BIOPSY  06/11/2011/Right BReast   Procedure: MASTECTOMY WITH SENTINEL LYMPH NODE BIOPSY;  Surgeon: Ernestene Mention, MD;  Location: [facility] OR;  Service: General;  Laterality: Right;  Right total mastectomy and sentinel lymph node biopsy using lymphatic mapping and blue dye injection.   TUBAL LIGATION     [place] FINE NEEDLE ASPIRATION WO/ W SMEAR  05/22/10   Left Breast- cyst or abscess    Current Outpatient Medications on File Prior to Visit  Medication Sig Dispense Refill   amLODipine (NORVASC) 5 MG tablet TAKE 2 TABLETS BY MOUTH EVERY DAY EQUAL 10MG  PER DAY 90 tablet 3   B Complex-C-Folic Acid (MULTIVITAMIN,  STRESS FORMULA) tablet Take 1 tablet by mouth daily. gummie bear MVT     Calcium Carbonate-Vit D-Min (CALTRATE PLUS PO) Take by mouth. 1200MG  OF CALCIUM & 800IU OF VITAMIN D     furosemide (LASIX) 40 MG tablet Take 1 tablet (40 mg total) by mouth daily as needed. 30 tablet 3   levocetirizine (XYZAL ALLERGY 24HR) 5 MG tablet Take 1 tablet (5 mg total) by mouth every evening. 30 tablet 0   ezetimibe (ZETIA) 10 MG tablet Take 1 tablet (10 mg total) by mouth daily. (Patient not taking: Reported on 10/05/2022) 90 tablet 3   No current facility-administered medications on file prior to visit.   Allergies  Allergen Reactions   Fosamax [Alendronate Sodium] Other (See Comments)    Aches and pains   Social History   Socioeconomic History   Marital status: Single    Spouse name: Not on file   Number of children: 5   Years of education: Not on file   Highest education level: Not on file  Occupational History   Occupation: Retired  Tobacco Use   Smoking status: Never   Smokeless tobacco: Never  Substance and Sexual Activity   Alcohol use: No  Drug use: No   Sexual activity: Not Currently    Birth control/protection: Post-menopausal  Other Topics Concern   Not on file  Social History Narrative   Not on file   Social Determinants of Health   Financial Resource Strain: Low Risk  (07/25/2022)   Overall Financial Resource Strain (CARDIA)    Difficulty of Paying Living Expenses: Not hard at all  Food Insecurity: No Food Insecurity (07/25/2022)   Hunger Vital Sign    Worried About Running Out of Food in the Last Year: Never true    Ran Out of Food in the Last Year: Never true  Transportation Needs: No Transportation Needs (07/25/2022)   PRAPARE - Administrator, Civil Service (Medical): No    Lack of Transportation (Non-Medical): No  Physical Activity: Sufficiently Active (07/25/2022)   Exercise Vital Sign    Days of Exercise per Week: 7 days    Minutes of Exercise per Session:  150+ min  Stress: No Stress Concern Present (07/25/2022)   [employer] of Occupational Health - Occupational Stress Questionnaire    Feeling of Stress : Not at all  Social Connections: Socially Isolated (07/25/2022)   Social Connection and Isolation Panel [NHANES]    Frequency of Communication with Friends and Family: More than three times a week    Frequency of Social Gatherings with Friends and Family: More than three times a week    Attends Religious Services: Never    Database administrator or Organizations: No    Attends Banker Meetings: Never    Marital Status: Divorced  Catering manager Violence: Not At Risk (07/25/2022)   Humiliation, Afraid, Rape, and Kick questionnaire    Fear of Current or Ex-Partner: No    Emotionally Abused: No    Physically Abused: No    Sexually Abused: No      Review of Systems  All other systems reviewed and are negative.      Objective:   Physical Exam Vitals reviewed.  Constitutional:      General: She is not in acute distress.    Appearance: She is obese. She is not ill-appearing, toxic-appearing or diaphoretic.  Eyes:     General: Lids are normal.     Extraocular Movements: Extraocular movements intact.     Conjunctiva/sclera: Conjunctivae normal.     Pupils: Pupils are equal, round, and reactive to light.  Neck:     Vascular: No carotid bruit.  Cardiovascular:     Rate and Rhythm: Normal rate and regular rhythm.     Heart sounds: Normal heart sounds. No murmur heard.    No friction rub. No gallop.  Pulmonary:     Effort: Pulmonary effort is normal. No respiratory distress.     Breath sounds: Normal breath sounds. No wheezing, rhonchi or rales.  Abdominal:     General: Bowel sounds are normal.     Palpations: Abdomen is soft.  Musculoskeletal:     Right lower leg: No edema.     Left lower leg: No edema.  Neurological:     Mental Status: She is alert.    I attempted funduscopic exam however I was unable to  visualize the retina well       Assessment & Plan:  Blurry vision, left eye Patient's symptoms are concerning for possible retinal tear versus macular degeneration.  I will try to expedite the patient's ophthalmology consultation.  She has an appointment for June 10.  I explained to the patient  that I do not think this is related to zetia.  Called Dr. Meda Coffee office and asked them to expedite her appointment with her ophthalmologist.  Had to leave a voice message with their staff.  Waiting to hear back from them.

## 2022-10-15 DIAGNOSIS — H26492 Other secondary cataract, left eye: Secondary | ICD-10-CM | POA: Diagnosis not present

## 2022-12-11 ENCOUNTER — Telehealth: Payer: Self-pay

## 2022-12-11 NOTE — Telephone Encounter (Signed)
Reached out to patient to set up follow up visit with provider to discuss chronic conditions.  Telephone encounter attempt : 1   No answer. Diane Paul [facility] Specialist

## 2022-12-25 NOTE — Telephone Encounter (Signed)
 Reached out to patient to set up follow up visit with provider to discuss chronic conditions.  Telephone encounter attempt : 2   No answer  Elijio Miles [facility] Specialist

## 2023-01-11 ENCOUNTER — Ambulatory Visit (INDEPENDENT_AMBULATORY_CARE_PROVIDER_SITE_OTHER): Payer: Medicare HMO | Admitting: Family Medicine

## 2023-01-11 VITALS — BP 118/68 | HR 114 | Temp 99.1°F | Ht 68.5 in | Wt 200.4 lb

## 2023-01-11 DIAGNOSIS — R5383 Other fatigue: Secondary | ICD-10-CM | POA: Diagnosis not present

## 2023-01-11 NOTE — Progress Notes (Signed)
Subjective:    Patient ID: Diane Paul, female    DOB: 10-Feb-1941, 82 y.o.   MRN: [id number]  Patient presents today complaining of severe fatigue.  She denies any chest pain.  She denies shortness of breath or dyspnea on exertion.  She does have sinus tachycardia at rest with a heart rate of 118 bpm.  Her conjunctiva appear pale.  Her nailbeds appear pale.  Her skin suggests dehydration due to decreased skin turgor.  She denies any nausea or vomiting.  She denies any dysuria.  She denies any melena or hematochezia.   Past Medical History:  Diagnosis Date   Anemia    Anxiety    nervous sometimes, denies panica ttacks    Arthritis    knees ?   Blood transfusion    post vaginal birth- 1960    Breast cancer (HCC)    Right Breast Cancer   Cancer (HCC)    R breast cancer   Dry mouth    Hypertension    Neuromuscular disorder (HCC)    L leg, thigh- "burns sometimes"   Shortness of breath    sometimes    Use of letrozole (Femara) 05/2010   neoadjuvant femara therapy since 1/212   Past Surgical History:  Procedure Laterality Date   ABDOMINAL HYSTERECTOMY     BREAST LUMPECTOMY     BREAST SURGERY     Right   jp drains     s/p masectomy 06/11/11/ Dr. [name], 2 jp drains right chest wall   MASTECTOMY Right    malignant   MASTECTOMY W/ SENTINEL NODE BIOPSY  06/11/2011/Right BReast   Procedure: MASTECTOMY WITH SENTINEL LYMPH NODE BIOPSY;  Surgeon: Ernestene Mention, MD;  Location: [facility] OR;  Service: General;  Laterality: Right;  Right total mastectomy and sentinel lymph node biopsy using lymphatic mapping and blue dye injection.   TUBAL LIGATION     [place] FINE NEEDLE ASPIRATION WO/ W SMEAR  05/22/10   Left Breast- cyst or abscess    Current Outpatient Medications on File Prior to Visit  Medication Sig Dispense Refill   amLODipine (NORVASC) 5 MG tablet TAKE 2 TABLETS BY MOUTH EVERY DAY EQUAL 10MG  PER DAY 90 tablet 3   B Complex-C-Folic Acid (MULTIVITAMIN, STRESS FORMULA) tablet Take 1  tablet by mouth daily. gummie bear MVT     Calcium Carbonate-Vit D-Min (CALTRATE PLUS PO) Take by mouth. 1200MG  OF CALCIUM & 800IU OF VITAMIN D     ferrous sulfate 324 MG TBEC Take 324 mg by mouth.     furosemide (LASIX) 40 MG tablet Take 1 tablet (40 mg total) by mouth daily as needed. 30 tablet 3   levocetirizine (XYZAL ALLERGY 24HR) 5 MG tablet Take 1 tablet (5 mg total) by mouth every evening. 30 tablet 0   ezetimibe (ZETIA) 10 MG tablet Take 1 tablet (10 mg total) by mouth daily. (Patient not taking: Reported on 01/11/2023) 90 tablet 3   No current facility-administered medications on file prior to visit.   Allergies  Allergen Reactions   Fosamax [Alendronate Sodium] Other (See Comments)    Aches and pains   Social History   Socioeconomic History   Marital status: Single    Spouse name: Not on file   Number of children: 5   Years of education: Not on file   Highest education level: Not on file  Occupational History   Occupation: Retired  Tobacco Use   Smoking status: Never   Smokeless tobacco: Never  Substance  and Sexual Activity   Alcohol use: No   Drug use: No   Sexual activity: Not Currently    Birth control/protection: Post-menopausal  Other Topics Concern   Not on file  Social History Narrative   Not on file   Social Determinants of Health   Financial Resource Strain: Low Risk  (07/25/2022)   Overall Financial Resource Strain (CARDIA)    Difficulty of Paying Living Expenses: Not hard at all  Food Insecurity: No Food Insecurity (07/25/2022)   Hunger Vital Sign    Worried About Running Out of Food in the Last Year: Never true    Ran Out of Food in the Last Year: Never true  Transportation Needs: No Transportation Needs (07/25/2022)   PRAPARE - Administrator, Civil Service (Medical): No    Lack of Transportation (Non-Medical): No  Physical Activity: Sufficiently Active (07/25/2022)   Exercise Vital Sign    Days of Exercise per Week: 7 days    Minutes  of Exercise per Session: 150+ min  Stress: No Stress Concern Present (07/25/2022)   [employer] of Occupational Health - Occupational Stress Questionnaire    Feeling of Stress : Not at all  Social Connections: Socially Isolated (07/25/2022)   Social Connection and Isolation Panel [NHANES]    Frequency of Communication with Friends and Family: More than three times a week    Frequency of Social Gatherings with Friends and Family: More than three times a week    Attends Religious Services: Never    Database administrator or Organizations: No    Attends Banker Meetings: Never    Marital Status: Divorced  Catering manager Violence: Not At Risk (07/25/2022)   Humiliation, Afraid, Rape, and Kick questionnaire    Fear of Current or Ex-Partner: No    Emotionally Abused: No    Physically Abused: No    Sexually Abused: No      Review of Systems  All other systems reviewed and are negative.      Objective:   Physical Exam Vitals reviewed.  Constitutional:      General: She is not in acute distress.    Appearance: She is obese. She is not ill-appearing, toxic-appearing or diaphoretic.  HENT:     Mouth/Throat:     Mouth: Mucous membranes are dry.  Eyes:     General: Lids are normal.     Extraocular Movements: Extraocular movements intact.     Conjunctiva/sclera: Conjunctivae normal.     Pupils: Pupils are equal, round, and reactive to light.  Neck:     Vascular: No carotid bruit.  Cardiovascular:     Rate and Rhythm: Regular rhythm. Tachycardia present.     Heart sounds: Normal heart sounds. No murmur heard.    No friction rub. No gallop.  Pulmonary:     Effort: Pulmonary effort is normal. No respiratory distress.     Breath sounds: Normal breath sounds. No stridor. No wheezing, rhonchi or rales.  Chest:     Chest wall: No tenderness.  Abdominal:     General: Bowel sounds are normal. There is no distension.     Palpations: Abdomen is soft.     Tenderness:  There is no abdominal tenderness. There is no guarding.  Musculoskeletal:     Cervical back: No tenderness.     Right lower leg: No edema.     Left lower leg: No edema.  Lymphadenopathy:     Cervical: No cervical adenopathy.  Neurological:  General: No focal deficit present.     Mental Status: She is alert and oriented to person, place, and time.     Cranial Nerves: No cranial nerve deficit.     Motor: No weakness.     Gait: Gait normal.         Assessment & Plan:  Fatigue, unspecified type - Plan: CBC with Differential/Platelet, COMPLETE METABOLIC PANEL WITH GFR, TSH, Vitamin B12, Iron Patient appears dehydrated and anemic.  Obtain lab work to confirm my suspicion.  Check CBC, CMP, TSH, B12, and iron level.  Temporarily discontinue amlodipine and furosemide until lab work returns.  Recommended the patient push fluids and start taking an iron pill, ferrous sulfate 325 mg once daily

## 2023-01-12 LAB — COMPLETE METABOLIC PANEL WITH GFR
AG Ratio: 0.8 (calc) — ABNORMAL LOW (ref 1.0–2.5)
ALT: 17 U/L (ref 6–29)
AST: 20 U/L (ref 10–35)
Albumin: 3.1 g/dL — ABNORMAL LOW (ref 3.6–5.1)
Alkaline phosphatase (APISO): 69 U/L (ref 37–153)
BUN: 13 mg/dL (ref 7–25)
CO2: 24 mmol/L (ref 20–32)
Calcium: 8.8 mg/dL (ref 8.6–10.4)
Chloride: 101 mmol/L (ref 98–110)
Creat: 0.91 mg/dL (ref 0.60–0.95)
Globulin: 3.8 g/dL — ABNORMAL HIGH (ref 1.9–3.7)
Glucose, Bld: 104 mg/dL — ABNORMAL HIGH (ref 65–99)
Potassium: 5 mmol/L (ref 3.5–5.3)
Sodium: 138 mmol/L (ref 135–146)
Total Bilirubin: 0.4 mg/dL (ref 0.2–1.2)
Total Protein: 6.9 g/dL (ref 6.1–8.1)
eGFR: 63 mL/min/{1.73_m2} (ref >=60)

## 2023-01-12 LAB — CBC WITH DIFFERENTIAL/PLATELET
Absolute Monocytes: 1426 {cells}/uL — ABNORMAL HIGH (ref 200–950)
Basophils Absolute: 37 {cells}/uL (ref 0–200)
Basophils Relative: 0.3 %
Eosinophils Absolute: 74 {cells}/uL (ref 15–500)
Eosinophils Relative: 0.6 %
HCT: 28.2 % — ABNORMAL LOW (ref 35.0–45.0)
Hemoglobin: 8.4 g/dL — ABNORMAL LOW (ref 11.7–15.5)
Lymphs Abs: 1674 {cells}/uL (ref 850–3900)
MCH: 22.2 pg — ABNORMAL LOW (ref 27.0–33.0)
MCHC: 29.8 g/dL — ABNORMAL LOW (ref 32.0–36.0)
MCV: 74.4 fL — ABNORMAL LOW (ref 80.0–100.0)
MPV: 9.8 fL (ref 7.5–12.5)
Monocytes Relative: 11.5 %
Neutro Abs: 9188 {cells}/uL — ABNORMAL HIGH (ref 1500–7800)
Neutrophils Relative %: 74.1 %
Platelets: 559 10*3/uL — ABNORMAL HIGH (ref 140–400)
RBC: 3.79 10*6/uL — ABNORMAL LOW (ref 3.80–5.10)
RDW: 14.7 % (ref 11.0–15.0)
Total Lymphocyte: 13.5 %
WBC: 12.4 10*3/uL — ABNORMAL HIGH (ref 3.8–10.8)

## 2023-01-12 LAB — TSH: TSH: 1.43 mIU/L (ref 0.40–4.50)

## 2023-01-12 LAB — IRON: Iron: 14 ug/dL — ABNORMAL LOW (ref 45–160)

## 2023-01-12 LAB — VITAMIN B12: Vitamin B-12: 610 pg/mL (ref 200–1100)

## 2023-01-14 ENCOUNTER — Other Ambulatory Visit: Payer: Self-pay

## 2023-01-14 MED ORDER — IRON (FERROUS SULFATE) 325 (65 FE) MG PO TABS: 325.0000 mg | ORAL_TABLET | Freq: Every day | ORAL | 0 refills | Status: DC

## 2023-01-14 NOTE — Progress Notes (Signed)
Patient came in to office and picked up Hemoccult card for stool specimen.

## 2023-01-14 NOTE — Addendum Note (Signed)
Addended by: Elizbeth Squires on: 01/14/2023 12:58 PM   Modules accepted: Orders

## 2023-01-25 ENCOUNTER — Telehealth: Payer: Self-pay | Admitting: Family Medicine

## 2023-01-25 NOTE — Telephone Encounter (Signed)
Patient called to follow up; stated she's been taking the iron for about 5 days and still doesn't feel well. Sx: feels sick and very lethargic. Feels like getting in the bed all the time. Patient drinking blackberry wine (told it brings up levels). Stated she had this issue when she was 82 years old and it improved within one week with diet changes and iron pills.   Requesting call back at [phone].

## 2023-01-27 ENCOUNTER — Other Ambulatory Visit: Payer: Self-pay | Admitting: Family Medicine

## 2023-01-27 DIAGNOSIS — I10 Essential (primary) hypertension: Secondary | ICD-10-CM

## 2023-01-28 NOTE — Telephone Encounter (Signed)
Patient called to follow up on list of iron rich foods; requesting clarity.   Nurse will print list and leave at the front desk for the patient; stated she will pick it up tomorrow morning.

## 2023-01-29 NOTE — Telephone Encounter (Signed)
Request refill too soon last filled 07/19/22 #90, 3RF Requested Prescriptions  Pending Prescriptions Disp Refills   amLODipine (NORVASC) 5 MG tablet [Pharmacy Med Name: AMLODIPINE BESYLATE 5 MG TAB] 90 tablet 3    Sig: TAKE 2 TABLETS BY MOUTH EVERY DAY EQUAL 10MG  PER DAY     Cardiovascular: Calcium Channel Blockers 2 Failed - 01/27/2023  8:50 AM      Failed - Last Heart Rate in normal range    Pulse Readings from Last 1 Encounters:  01/11/23 (!) 114         Failed - Valid encounter within last 6 months    Recent Outpatient Visits           2 years ago Acute gout involving toe of left foot, unspecified cause   [facility] Donita Brooks, MD   2 years ago Acute gout involving toe of left foot, unspecified cause   [facility] Donita Brooks, MD   5 years ago Acute left ankle pain   [facility] Allayne Butcher B, PA-C   6 years ago Benign essential HTN   [facility] Pickard, Priscille Heidelberg, MD   7 years ago Essential hypertension   [facility] Pickard, Priscille Heidelberg, MD              Passed - Last BP in normal range    BP Readings from Last 1 Encounters:  01/11/23 118/68

## 2023-02-01 ENCOUNTER — Observation Stay (HOSPITAL_COMMUNITY): Payer: Medicare HMO

## 2023-02-01 ENCOUNTER — Ambulatory Visit (INDEPENDENT_AMBULATORY_CARE_PROVIDER_SITE_OTHER): Payer: Medicare HMO | Admitting: Family Medicine

## 2023-02-01 ENCOUNTER — Inpatient Hospital Stay (HOSPITAL_COMMUNITY)
Admission: EM | Admit: 2023-02-01 | Discharge: 2023-02-06 | DRG: 687 | Disposition: A | Discharge status: Home / Self Care | Payer: Medicare HMO | Attending: Internal Medicine | Admitting: Internal Medicine

## 2023-02-01 ENCOUNTER — Encounter: Payer: Self-pay | Admitting: Family Medicine

## 2023-02-01 ENCOUNTER — Emergency Department (HOSPITAL_COMMUNITY): Payer: Medicare HMO

## 2023-02-01 ENCOUNTER — Other Ambulatory Visit: Payer: Self-pay

## 2023-02-01 ENCOUNTER — Encounter (HOSPITAL_COMMUNITY): Payer: Self-pay | Admitting: Emergency Medicine

## 2023-02-01 VITALS — BP 126/78 | HR 115 | Temp 98.6°F | Ht 68.5 in | Wt 194.0 lb

## 2023-02-01 DIAGNOSIS — K219 Gastro-esophageal reflux disease without esophagitis: Secondary | ICD-10-CM | POA: Diagnosis present

## 2023-02-01 DIAGNOSIS — C7802 Secondary malignant neoplasm of left lung: Secondary | ICD-10-CM | POA: Diagnosis present

## 2023-02-01 DIAGNOSIS — C641 Malignant neoplasm of right kidney, except renal pelvis: Secondary | ICD-10-CM | POA: Diagnosis not present

## 2023-02-01 DIAGNOSIS — K922 Gastrointestinal hemorrhage, unspecified: Secondary | ICD-10-CM

## 2023-02-01 DIAGNOSIS — D75839 Thrombocytosis, unspecified: Secondary | ICD-10-CM | POA: Insufficient documentation

## 2023-02-01 DIAGNOSIS — K5730 Diverticulosis of large intestine without perforation or abscess without bleeding: Secondary | ICD-10-CM | POA: Diagnosis not present

## 2023-02-01 DIAGNOSIS — D49511 Neoplasm of unspecified behavior of right kidney: Secondary | ICD-10-CM | POA: Diagnosis not present

## 2023-02-01 DIAGNOSIS — K449 Diaphragmatic hernia without obstruction or gangrene: Secondary | ICD-10-CM | POA: Diagnosis present

## 2023-02-01 DIAGNOSIS — R634 Abnormal weight loss: Secondary | ICD-10-CM | POA: Insufficient documentation

## 2023-02-01 DIAGNOSIS — K625 Hemorrhage of anus and rectum: Secondary | ICD-10-CM | POA: Diagnosis not present

## 2023-02-01 DIAGNOSIS — K921 Melena: Secondary | ICD-10-CM

## 2023-02-01 DIAGNOSIS — R918 Other nonspecific abnormal finding of lung field: Secondary | ICD-10-CM | POA: Diagnosis not present

## 2023-02-01 DIAGNOSIS — Z905 Acquired absence of kidney: Secondary | ICD-10-CM

## 2023-02-01 DIAGNOSIS — K8020 Calculus of gallbladder without cholecystitis without obstruction: Secondary | ICD-10-CM | POA: Diagnosis not present

## 2023-02-01 DIAGNOSIS — R000 Tachycardia, unspecified: Secondary | ICD-10-CM | POA: Diagnosis not present

## 2023-02-01 DIAGNOSIS — D72829 Elevated white blood cell count, unspecified: Secondary | ICD-10-CM | POA: Diagnosis present

## 2023-02-01 DIAGNOSIS — D630 Anemia in neoplastic disease: Secondary | ICD-10-CM | POA: Diagnosis present

## 2023-02-01 DIAGNOSIS — R0602 Shortness of breath: Secondary | ICD-10-CM | POA: Diagnosis not present

## 2023-02-01 DIAGNOSIS — C649 Malignant neoplasm of unspecified kidney, except renal pelvis: Secondary | ICD-10-CM

## 2023-02-01 DIAGNOSIS — Z9011 Acquired absence of right breast and nipple: Secondary | ICD-10-CM

## 2023-02-01 DIAGNOSIS — E876 Hypokalemia: Secondary | ICD-10-CM | POA: Diagnosis present

## 2023-02-01 DIAGNOSIS — K2970 Gastritis, unspecified, without bleeding: Secondary | ICD-10-CM | POA: Diagnosis present

## 2023-02-01 DIAGNOSIS — Z79899 Other long term (current) drug therapy: Secondary | ICD-10-CM

## 2023-02-01 DIAGNOSIS — Z803 Family history of malignant neoplasm of breast: Secondary | ICD-10-CM

## 2023-02-01 DIAGNOSIS — C7801 Secondary malignant neoplasm of right lung: Secondary | ICD-10-CM

## 2023-02-01 DIAGNOSIS — I700 Atherosclerosis of aorta: Secondary | ICD-10-CM | POA: Diagnosis not present

## 2023-02-01 DIAGNOSIS — E041 Nontoxic single thyroid nodule: Secondary | ICD-10-CM | POA: Diagnosis not present

## 2023-02-01 DIAGNOSIS — M170 Bilateral primary osteoarthritis of knee: Secondary | ICD-10-CM | POA: Diagnosis present

## 2023-02-01 DIAGNOSIS — D122 Benign neoplasm of ascending colon: Secondary | ICD-10-CM | POA: Diagnosis present

## 2023-02-01 DIAGNOSIS — D509 Iron deficiency anemia, unspecified: Secondary | ICD-10-CM | POA: Diagnosis present

## 2023-02-01 DIAGNOSIS — K3189 Other diseases of stomach and duodenum: Secondary | ICD-10-CM | POA: Diagnosis present

## 2023-02-01 DIAGNOSIS — Z888 Allergy status to other drugs, medicaments and biological substances status: Secondary | ICD-10-CM

## 2023-02-01 DIAGNOSIS — D500 Iron deficiency anemia secondary to blood loss (chronic): Secondary | ICD-10-CM | POA: Diagnosis not present

## 2023-02-01 DIAGNOSIS — Z853 Personal history of malignant neoplasm of breast: Secondary | ICD-10-CM

## 2023-02-01 DIAGNOSIS — R1903 Right lower quadrant abdominal swelling, mass and lump: Secondary | ICD-10-CM | POA: Diagnosis not present

## 2023-02-01 DIAGNOSIS — D75838 Other thrombocytosis: Secondary | ICD-10-CM | POA: Diagnosis present

## 2023-02-01 DIAGNOSIS — D649 Anemia, unspecified: Principal | ICD-10-CM | POA: Diagnosis present

## 2023-02-01 DIAGNOSIS — I7121 Aneurysm of the ascending aorta, without rupture: Secondary | ICD-10-CM | POA: Diagnosis not present

## 2023-02-01 DIAGNOSIS — I10 Essential (primary) hypertension: Secondary | ICD-10-CM | POA: Diagnosis present

## 2023-02-01 LAB — COMPREHENSIVE METABOLIC PANEL
ALT: 20 U/L (ref 0–44)
AST: 19 U/L (ref 15–41)
Albumin: 1.9 g/dL — ABNORMAL LOW (ref 3.5–5.0)
Alkaline Phosphatase: 80 U/L (ref 38–126)
Anion gap: 12 (ref 5–15)
BUN: 11 mg/dL (ref 8–23)
CO2: 25 mmol/L (ref 22–32)
Calcium: 8.3 mg/dL — ABNORMAL LOW (ref 8.9–10.3)
Chloride: 99 mmol/L (ref 98–111)
Creatinine, Ser: 1.05 mg/dL — ABNORMAL HIGH (ref 0.44–1.00)
GFR, Estimated: 53 mL/min — ABNORMAL LOW (ref >60)
Glucose, Bld: 110 mg/dL — ABNORMAL HIGH (ref 70–99)
Potassium: 3.4 mmol/L — ABNORMAL LOW (ref 3.5–5.1)
Sodium: 136 mmol/L (ref 135–145)
Total Bilirubin: 0.7 mg/dL (ref 0.3–1.2)
Total Protein: 7.1 g/dL (ref 6.5–8.1)

## 2023-02-01 LAB — URINALYSIS, ROUTINE W REFLEX MICROSCOPIC
Bilirubin Urine: NEGATIVE
Glucose, UA: NEGATIVE mg/dL
Hgb urine dipstick: NEGATIVE
Ketones, ur: NEGATIVE mg/dL
Leukocytes,Ua: NEGATIVE
Nitrite: NEGATIVE
Protein, ur: NEGATIVE mg/dL
Specific Gravity, Urine: >1.046 — ABNORMAL HIGH (ref 1.005–1.030)
pH: 5 (ref 5.0–8.0)

## 2023-02-01 LAB — CBC
HCT: 24.1 % — ABNORMAL LOW (ref 36.0–46.0)
HCT: 23 % — ABNORMAL LOW (ref 36.0–46.0)
Hemoglobin: 7 g/dL — ABNORMAL LOW (ref 12.0–15.0)
Hemoglobin: 6.7 g/dL — CL (ref 12.0–15.0)
MCH: 21.9 pg — ABNORMAL LOW (ref 26.0–34.0)
MCH: 21.6 pg — ABNORMAL LOW (ref 26.0–34.0)
MCHC: 29 g/dL — ABNORMAL LOW (ref 30.0–36.0)
MCHC: 29.1 g/dL — ABNORMAL LOW (ref 30.0–36.0)
MCV: 75.5 fL — ABNORMAL LOW (ref 80.0–100.0)
MCV: 74.2 fL — ABNORMAL LOW (ref 80.0–100.0)
Platelets: 599 10*3/uL — ABNORMAL HIGH (ref 150–400)
Platelets: 565 10*3/uL — ABNORMAL HIGH (ref 150–400)
RBC: 3.19 MIL/uL — ABNORMAL LOW (ref 3.87–5.11)
RBC: 3.1 MIL/uL — ABNORMAL LOW (ref 3.87–5.11)
RDW: 16.6 % — ABNORMAL HIGH (ref 11.5–15.5)
RDW: 16.7 % — ABNORMAL HIGH (ref 11.5–15.5)
WBC: 14.4 10*3/uL — ABNORMAL HIGH (ref 4.0–10.5)
WBC: 13.8 10*3/uL — ABNORMAL HIGH (ref 4.0–10.5)
nRBC: 0 % (ref 0.0–0.2)
nRBC: 0 % (ref 0.0–0.2)

## 2023-02-01 LAB — HEMOGLOBIN, FINGERSTICK: POC HEMOGLOBIN: 7.7 g/dL — ABNORMAL LOW (ref 12.0–15.0)

## 2023-02-01 LAB — ABO/RH: ABO/RH(D): A POS

## 2023-02-01 MED ORDER — IOHEXOL 350 MG/ML SOLN
75.0000 mL | Freq: Once | INTRAVENOUS | Status: AC | PRN
  Administered 2023-02-01: 50 mL via INTRAVENOUS

## 2023-02-01 MED ORDER — HYDROMORPHONE HCL 1 MG/ML IJ SOLN
0.5000 mg | Freq: Once | INTRAMUSCULAR | Status: AC
  Administered 2023-02-01: 0.5 mg via INTRAVENOUS
  Filled 2023-02-01: qty 1

## 2023-02-01 MED ORDER — ACETAMINOPHEN 500 MG PO TABS
1000.0000 mg | ORAL_TABLET | Freq: Four times a day (QID) | ORAL | Status: DC | PRN
  Administered 2023-02-02: 1000 mg via ORAL
  Filled 2023-02-01: qty 2

## 2023-02-01 MED ORDER — POTASSIUM CHLORIDE CRYS ER 20 MEQ PO TBCR
40.0000 meq | EXTENDED_RELEASE_TABLET | Freq: Once | ORAL | Status: AC
  Administered 2023-02-01: 40 meq via ORAL
  Filled 2023-02-01: qty 2

## 2023-02-01 MED ORDER — IOHEXOL 350 MG/ML SOLN
75.0000 mL | Freq: Once | INTRAVENOUS | Status: AC | PRN
  Administered 2023-02-01: 75 mL via INTRAVENOUS

## 2023-02-01 MED ORDER — FERROUS SULFATE 325 (65 FE) MG PO TABS
325.0000 mg | ORAL_TABLET | Freq: Every day | ORAL | Status: DC
  Administered 2023-02-02 – 2023-02-06 (×4): 325 mg via ORAL
  Filled 2023-02-01 (×4): qty 1

## 2023-02-01 MED ORDER — PANTOPRAZOLE SODIUM 40 MG IV SOLR
40.0000 mg | Freq: Two times a day (BID) | INTRAVENOUS | Status: DC
  Administered 2023-02-01 – 2023-02-04 (×5): 40 mg via INTRAVENOUS
  Filled 2023-02-01 (×5): qty 10

## 2023-02-01 MED ORDER — SODIUM CHLORIDE 0.9% FLUSH
3.0000 mL | Freq: Two times a day (BID) | INTRAVENOUS | Status: DC
  Administered 2023-02-01 – 2023-02-06 (×9): 3 mL via INTRAVENOUS

## 2023-02-01 MED ORDER — LACTATED RINGERS IV BOLUS
500.0000 mL | Freq: Once | INTRAVENOUS | Status: AC
  Administered 2023-02-01: 500 mL via INTRAVENOUS

## 2023-02-01 MED ORDER — LACTATED RINGERS IV BOLUS
1000.0000 mL | Freq: Once | INTRAVENOUS | Status: AC
  Administered 2023-02-01: 1000 mL via INTRAVENOUS

## 2023-02-01 NOTE — ED Provider Notes (Signed)
[place] EMERGENCY DEPARTMENT AT [facility] Provider Note   CSN: [id number] Arrival date & time: 02/01/23  1417     History  Chief Complaint  Patient presents with   Rectal Bleeding    Diane Paul is a 82 y.o. female.   Rectal Bleeding 82 year old female with past medical history of breast cancer, hypertension, iron deficiency anemia presenting for evaluation of rectal bleeding.  Patient states that she was at her primary care physician for routine lab draw today when she was told that she had low hemoglobin.  Guaiac was positive at that visit as well.  Primary care physician sent her to the emergency department for additional evaluation.  Patient denies any recent changes in bowel habits.  She specifically denies any dark, melanotic stool, additionally denying any bright red blood per rectum.  No pain when she defecates.  Patient has experienced some mild shortness of breath with exertion over the last several weeks, but otherwise denies any headaches, vision loss, lightheadedness, chest pain, abdominal pain, pain with urination, urinary frequency.     Home Medications Prior to Admission medications   Medication Sig Start Date End Date Taking? Authorizing Provider  amLODipine (NORVASC) 5 MG tablet TAKE 2 TABLETS BY MOUTH EVERY DAY EQUAL 10MG  PER DAY 07/19/22   Donita Brooks, MD  B Complex-C-Folic Acid (MULTIVITAMIN, STRESS FORMULA) tablet Take 1 tablet by mouth daily. gummie bear MVT    [provider]  Calcium Carbonate-Vit D-Min (CALTRATE PLUS PO) Take by mouth. 1200MG  OF CALCIUM & 800IU OF VITAMIN D    [provider]  ezetimibe (ZETIA) 10 MG tablet Take 1 tablet (10 mg total) by mouth daily. 12/19/21   Donita Brooks, MD  furosemide (LASIX) 40 MG tablet Take 1 tablet (40 mg total) by mouth daily as needed. 09/27/20   Donita Brooks, MD  Iron, Ferrous Sulfate, 325 (65 Fe) MG TABS Take 325 mg by mouth daily. 01/14/23   Donita Brooks, MD   levocetirizine (XYZAL ALLERGY 24HR) 5 MG tablet Take 1 tablet (5 mg total) by mouth every evening. 12/15/21   Donita Brooks, MD      Allergies    Fosamax [alendronate sodium]    Review of Systems   Review of Systems  Gastrointestinal:  Positive for hematochezia.    Physical Exam Updated Vital Signs BP 127/72 (BP Location: Left Arm)   Pulse 97   Temp 98.9 F (37.2 C) (Oral)   Resp 18   Ht 5\' 8"  (1.727 m)   Wt 88 kg   SpO2 97%   BMI 29.50 kg/m  Physical Exam Constitutional:      General: She is not in acute distress.    Appearance: She is not ill-appearing.  HENT:     Head: Normocephalic.     Right Ear: External ear normal.     Left Ear: External ear normal.     Nose: Nose normal.     Mouth/Throat:     Mouth: Mucous membranes are moist.     Pharynx: Oropharynx is clear.  Eyes:     Extraocular Movements: Extraocular movements intact.     Conjunctiva/sclera: Conjunctivae normal.     Pupils: Pupils are equal, round, and reactive to light.  Cardiovascular:     Rate and Rhythm: Regular rhythm. Tachycardia present.     Pulses: Normal pulses.  Pulmonary:     Effort: Pulmonary effort is normal. No respiratory distress.     Breath sounds: No wheezing.  Abdominal:     General: Abdomen is flat.     Palpations: Abdomen is soft.     Tenderness: There is no abdominal tenderness. There is no guarding or rebound.  Musculoskeletal:        General: Normal range of motion.     Cervical back: Normal range of motion. No rigidity.     Right lower leg: No edema.     Left lower leg: No edema.  Skin:    General: Skin is warm and dry.     Findings: No rash.  Neurological:     General: No focal deficit present.     Mental Status: She is alert.     Cranial Nerves: No cranial nerve deficit.     Sensory: No sensory deficit.     Motor: No weakness.     ED Results / Procedures / Treatments   Labs (all labs ordered are listed, but only abnormal results are displayed) Labs  Reviewed  COMPREHENSIVE METABOLIC PANEL - Abnormal; Notable for the following components:      Result Value   Potassium 3.4 (*)    Glucose, Bld 110 (*)    Creatinine, Ser 1.05 (*)    Calcium 8.3 (*)    Albumin 1.9 (*)    GFR, Estimated 53 (*)    All other components within normal limits  CBC - Abnormal; Notable for the following components:   WBC 14.4 (*)    RBC 3.19 (*)    Hemoglobin 7.0 (*)    HCT 24.1 (*)    MCV 75.5 (*)    MCH 21.9 (*)    MCHC 29.0 (*)    RDW 16.6 (*)    Platelets 599 (*)    All other components within normal limits  CBC - Abnormal; Notable for the following components:   WBC 13.8 (*)    RBC 3.10 (*)    Hemoglobin 6.7 (*)    HCT 23.0 (*)    MCV 74.2 (*)    MCH 21.6 (*)    MCHC 29.1 (*)    RDW 16.7 (*)    Platelets 565 (*)    All other components within normal limits  URINALYSIS, ROUTINE W REFLEX MICROSCOPIC - Abnormal; Notable for the following components:   Specific Gravity, Urine >1.046 (*)    All other components within normal limits  FERRITIN  IRON AND TIBC  TRANSFERRIN  FOLATE  PROTIME-INR  LACTATE DEHYDROGENASE  HAPTOGLOBIN  RETICULOCYTES  BASIC METABOLIC PANEL  MAGNESIUM  PHOSPHORUS  POC OCCULT BLOOD, ED  TYPE AND SCREEN  ABO/RH    EKG EKG Interpretation Date/Time:  Friday February 01 2023 14:35:31 EDT Ventricular Rate:  115 PR Interval:  136 QRS Duration:  87 QT Interval:  327 QTC Calculation: 453 R Axis:   -31  Text Interpretation: Sinus tachycardia Left ventricular hypertrophy Confirmed by Anders Simmonds [id number] on 02/01/2023 2:37:24 PM  Radiology CT CHEST W CONTRAST  Result Date: 02/01/2023 CLINICAL DATA:  Pulmonary nodules renal mass EXAM: CT CHEST WITH CONTRAST TECHNIQUE: Multidetector CT imaging of the chest was performed during intravenous contrast administration. RADIATION DOSE REDUCTION: This exam was performed according to the departmental dose-optimization program which includes automated exposure control,  adjustment of the mA and/or kV according to patient size and/or use of iterative reconstruction technique. CONTRAST:  50mL OMNIPAQUE IOHEXOL 350 MG/ML SOLN COMPARISON:  CT 02/01/2023, CT 05/26/2010 FINDINGS: Cardiovascular: Moderate aortic atherosclerosis. Mild aneurysmal dilatation of ascending aorta up to 4.1 cm. Borderline cardiomegaly. No pericardial effusion  Mediastinum/Nodes: Midline trachea. 1.5 cm hypodense nodule right lobe of thyroid with calcification but present on 2012 comparison. Stability for greater than 5 years implies benignity; no biopsy or followup indicated (ref: J Am Coll Radiol. 2015 Feb;12(2): 143-50). No suspicious lymph nodes. Esophagus within normal limits. Lungs/Pleura: Lungs demonstrate no acute airspace disease or pleural effusion. Numerous bilateral pulmonary nodules. The largest lesions will be measured. Right anterior upper lobe pulmonary nodule measuring 1.4 x 1.2 cm on series 4 image 45. Posterior right upper lobe pulmonary nodule measuring 14 x 10 mm on series 4, image 54. Left upper lobe posterior pulmonary nodule measuring 1.2 x 1.2 cm on series 4, image 76. Multiple left lower lobe pulmonary nodules including 10 x 9 mm nodule on series 4, image 89 and subpleural left lung base pulmonary nodule measuring 12 by 7 mm on series 4, image 105. Multiple additional bilateral pulmonary nodules are noted. Upper Abdomen: See separately dictated CT Musculoskeletal: No acute or suspicious osseous abnormality. IMPRESSION: 1. Numerous bilateral pulmonary nodules suspicious for metastatic disease. 2. Aortic atherosclerosis. Aortic Atherosclerosis (ICD10-I70.0). Electronically Signed   By: Jasmine Pang M.D.   On: 02/01/2023 22:03   CT Angio Abd/Pel W and/or Wo Contrast  Result Date: 02/01/2023 CLINICAL DATA:  Lower GI bleed EXAM: CTA ABDOMEN AND PELVIS WITHOUT AND WITH CONTRAST TECHNIQUE: Multidetector CT imaging of the abdomen and pelvis was performed using the standard protocol during  bolus administration of intravenous contrast. Multiplanar reconstructed images and MIPs were obtained and reviewed to evaluate the vascular anatomy. RADIATION DOSE REDUCTION: This exam was performed according to the departmental dose-optimization program which includes automated exposure control, adjustment of the mA and/or kV according to patient size and/or use of iterative reconstruction technique. CONTRAST:  75mL OMNIPAQUE IOHEXOL 350 MG/ML SOLN COMPARISON:  06/03/2010 FINDINGS: VASCULAR Aorta: Normal caliber aorta without aneurysm, dissection, vasculitis or significant stenosis. Scattered calcifications. Celiac: Patent without evidence of aneurysm, dissection, vasculitis or significant stenosis. SMA: Patent without evidence of aneurysm, dissection, vasculitis or significant stenosis. Renals: Both renal arteries are patent without evidence of aneurysm, dissection, vasculitis, fibromuscular dysplasia or significant stenosis. IMA: Patent without evidence of aneurysm, dissection, vasculitis or significant stenosis. Inflow: Patent without evidence of aneurysm, dissection, vasculitis or significant stenosis. Scattered calcifications. Proximal Outflow: Bilateral common femoral and visualized portions of the superficial and profunda femoral arteries are patent without evidence of aneurysm, dissection, vasculitis or significant stenosis. Veins: No obvious venous abnormality within the limitations of this arterial phase study. Review of the MIP images confirms the above findings. NON-VASCULAR Lower chest: 8 mm well-circumscribed rounded nodule in the left lower lobe, new since 20/12. Subpleural nodular area in the left lower lobe measures 11 mm on image 15. Left lower lobe nodule on image 19 measures 8 mm. Right middle lobe nodule on image 9 measures 4 mm. No effusions. Hepatobiliary: No focal hepatic abnormality. Multiple gallstones within the gallbladder. No biliary ductal dilatation. Pancreas: No focal abnormality or  ductal dilatation. Spleen: No focal abnormality.  Normal size. Adrenals/Urinary Tract: Adrenal glands normal. Large mass off the mid to lower pole of the right kidney with areas of enhancement concerning for renal cell carcinoma. This is new since prior study. This measures 11.1 x 9.1 x 6.6 cm. No suspicious left renal abnormality. No hydronephrosis. Urinary bladder unremarkable. Stomach/Bowel: Left colonic diverticulosis. No active diverticulitis. No active contrast extravasation to localize GI bleed. Stomach and small bowel decompressed, unremarkable. Lymphatic: No adenopathy. Reproductive: Prior hysterectomy.  No adnexal masses. Other: No free fluid  or free air. Musculoskeletal: No acute bony abnormality. IMPRESSION: VASCULAR No contrast extravasation to localize GI bleed. No evidence of aortic aneurysm or dissection. NON-VASCULAR Approximately 11 cm enhancing mass involving the mid to lower pole of the right kidney most compatible with renal cell carcinoma. Recommend urologic consultation. Cholelithiasis. Left colonic diverticulosis. Electronically Signed   By: Charlett Nose M.D.   On: 02/01/2023 19:12    Procedures Procedures    Medications Ordered in ED Medications  sodium chloride flush (NS) 0.9 % injection 3 mL (3 mLs Intravenous Given 02/01/23 2106)  acetaminophen (TYLENOL) tablet 1,000 mg (has no administration in time range)  pantoprazole (PROTONIX) injection 40 mg (40 mg Intravenous Given 02/01/23 2326)  ferrous sulfate tablet 325 mg (has no administration in time range)  lactated ringers bolus 1,000 mL (0 mLs Intravenous Stopped 02/01/23 1653)  iohexol (OMNIPAQUE) 350 MG/ML injection 75 mL (75 mLs Intravenous Contrast Given 02/01/23 1756)  HYDROmorphone (DILAUDID) injection 0.5 mg (0.5 mg Intravenous Given 02/01/23 2105)  potassium chloride SA (KLOR-CON M) CR tablet 40 mEq (40 mEq Oral Given 02/01/23 2106)  iohexol (OMNIPAQUE) 350 MG/ML injection 75 mL (50 mLs Intravenous Contrast Given 02/01/23  2144)  lactated ringers bolus 500 mL (500 mLs Intravenous New Bag/Given 02/01/23 2333)    ED Course/ Medical Decision Making/ A&P                                 Medical Decision Making Amount and/or Complexity of Data Reviewed Labs: ordered. Radiology: ordered.  Risk Prescription drug management. Decision regarding hospitalization.   ACELYN BASHAM is a 82 y.o. female who presents with low hemoglobin, positive guaiac, and exertional shortness of breath as per above.  I have reviewed the nursing documentation for past medical history, family history, and social history and agree.  I have reviewed the patient's vital signs.  On arrival, patient tachycardic with a heart rate of 117.  Oxygenating well on room air.  Upon initial evaluation physical exam reveals no acute findings such as abdominal tenderness, focal lung changes.    DDX considered included (but not limited to):    Lower GI bleed Differential diagnoses include diverticulitis (most common cause) versus malignancy, hemorrhoids. Less likely etiologies include angiodysplasia, cancer, IBD. Presentation not consistent with mesenteric ischemia or ischemic colitis, brisk or life threatening upper GIB as patient has no evidence of hemorrhagic shock or melena.  UGIB Differential diagnoses includes peptic ulcer disease, versus gastritis/gastric ulcer, versus possible AVM. Presentation not consistent with esophageal or gastric variceal bleeding or Boerhaave's syndrome. Presentation not consistent with other etiologies upper GI bleeding at this time. No red flag features or high risk bleeding. No evidence of hemorrhagic shock.   Testing Results: Lab results: Hemoglobin returned at 7, down from 8.4 3 weeks ago and greater than twelve 1 year ago.  Anemia is noted to be microcytic, consistent with patient's history of iron deficiency anemia.  However, positive guaiac result patient concern for GI bleeding.  Her EKG reviewed showed sinus  tachycardia without concerning ST segment elevations or depressions.  Not a STEMI.  There is evidence of mild left ventricular hypertrophy.  Imaging results: CT scan of the abdomen and pelvis did not show any active extravasation.  However, there was an 11 cm enhancing mass seen in the mid to lower pole of the right kidney, which radiology noted is most compatible with renal cell carcinoma.  There is also some evidence  for left colonic diverticulosis.  ED Course:   Patient provided IV fluids while in the emergency department given her tachycardia.  She was not below her transfusion threshold, therefore made the decision to hold off on blood products.  On reevaluation, patient's tachycardia has improved.  She remains hemodynamically stable.   Post-ED Care:  ADMIT: Patient is thought to require admission for symptomatic anemia. Patient will be admitted to hospitalist service.  Anticipate she will need GI follow-up.  Please see in patient provider note for additional treatment plan details.    The plan for this patient was discussed with my attending physician, who voiced agreement and who oversaw evaluation and treatment of this patient.    Note: Chief Executive Officer was used in the creation of this note. Grammatical errors may be present.     Final Clinical Impression(s) / ED Diagnoses Final diagnoses:  Anemia, unspecified type  Gastrointestinal hemorrhage, unspecified gastrointestinal hemorrhage type    Rx / DC Orders ED Discharge Orders     None         Lyman Speller, MD 02/01/23 Criss Rosales    Benjiman Core, MD 02/04/23 1409

## 2023-02-01 NOTE — Progress Notes (Signed)
Subjective:    Patient ID: Diane Paul, female    DOB: 04/01/1941, 82 y.o.   MRN: [id number] 01/11/23 Patient presents today complaining of severe fatigue.  She denies any chest pain.  She denies shortness of breath or dyspnea on exertion.  She does have sinus tachycardia at rest with a heart rate of 118 bpm.  Her conjunctiva appear pale.  Her nailbeds appear pale.  Her skin suggests dehydration due to decreased skin turgor.  She denies any nausea or vomiting.  She denies any dysuria.  She denies any melena or hematochezia.  At that time, my plan was: Patient appears dehydrated and anemic.  Obtain lab work to confirm my suspicion.  Check CBC, CMP, TSH, B12, and iron level.  Temporarily discontinue amlodipine and furosemide until lab work returns.  Recommended the patient push fluids and start taking an iron pill, ferrous sulfate 325 mg once daily  02/01/23 Office Visit on 01/11/2023  Component Date Value Ref Range Status   WBC 01/11/2023 12.4 (H)  3.8 - 10.8 Thousand/uL Final   RBC 01/11/2023 3.79 (L)  3.80 - 5.10 Million/uL Final   Hemoglobin 01/11/2023 8.4 (L)  11.7 - 15.5 g/dL Final   HCT 78/29/5621 28.2 (L)  35.0 - 45.0 % Final   MCV 01/11/2023 74.4 (L)  80.0 - 100.0 fL Final   MCH 01/11/2023 22.2 (L)  27.0 - 33.0 pg Final   MCHC 01/11/2023 29.8 (L)  32.0 - 36.0 g/dL Final   RDW 30/86/5784 14.7  11.0 - 15.0 % Final   Platelets 01/11/2023 559 (H)  140 - 400 Thousand/uL Final   MPV 01/11/2023 9.8  7.5 - 12.5 fL Final   Neutro Abs 01/11/2023 9,188 (H)  1,500 - 7,800 cells/uL Final   Lymphs Abs 01/11/2023 1,674  850 - 3,900 cells/uL Final   Absolute Monocytes 01/11/2023 1,426 (H)  200 - 950 cells/uL Final   Eosinophils Absolute 01/11/2023 74  15 - 500 cells/uL Final   Basophils Absolute 01/11/2023 37  0 - 200 cells/uL Final   Neutrophils Relative % 01/11/2023 74.1  % Final   Total Lymphocyte 01/11/2023 13.5  % Final   Monocytes Relative 01/11/2023 11.5  % Final   Eosinophils Relative  01/11/2023 0.6  % Final   Basophils Relative 01/11/2023 0.3  % Final   Glucose, Bld 01/11/2023 104 (H)  65 - 99 mg/dL Final   Comment: .            Fasting reference interval . For someone without known diabetes, a glucose value between 100 and 125 mg/dL is consistent with prediabetes and should be confirmed with a follow-up test. .    BUN 01/11/2023 13  7 - 25 mg/dL Final   Creat 69/62/9528 0.91  0.60 - 0.95 mg/dL Final   eGFR 41/32/4401 63  > OR = 60 mL/min/1.11m2 Final   BUN/Creatinine Ratio 01/11/2023 SEE NOTE:  6 - 22 (calc) Final   Comment:    Not Reported: BUN and Creatinine are within    reference range. .    Sodium 01/11/2023 138  135 - 146 mmol/L Final   Potassium 01/11/2023 5.0  3.5 - 5.3 mmol/L Final   Chloride 01/11/2023 101  98 - 110 mmol/L Final   CO2 01/11/2023 24  20 - 32 mmol/L Final   Calcium 01/11/2023 8.8  8.6 - 10.4 mg/dL Final   Total Protein 02/72/5366 6.9  6.1 - 8.1 g/dL Final   Albumin 44/07/4740 3.1 (L)  3.6 - 5.1  g/dL Final   Globulin 29/56/2130 3.8 (H)  1.9 - 3.7 g/dL (calc) Final   AG Ratio 01/11/2023 0.8 (L)  1.0 - 2.5 (calc) Final   Total Bilirubin 01/11/2023 0.4  0.2 - 1.2 mg/dL Final   Alkaline phosphatase (APISO) 01/11/2023 69  37 - 153 U/L Final   AST 01/11/2023 20  10 - 35 U/L Final   ALT 01/11/2023 17  6 - 29 U/L Final   TSH 01/11/2023 1.43  0.40 - 4.50 mIU/L Final   Vitamin B-12 01/11/2023 610  200 - 1,100 pg/mL Final   Iron 01/11/2023 14 (L)  45 - 160 mcg/dL Final   Recommended starting iron supplement and checking stool for blood.  Patient has not returned stool sample to rule out GI bleed.   Wt Readings from Last 3 Encounters:  02/01/23 194 lb (88 kg)  01/11/23 200 lb 6.4 oz (90.9 kg)  10/05/22 222 lb (100.7 kg)   Has lost 6 lbs since last visit and 28 pounds since 9/27.  I performed stool test in office today.  This was positive.  Fingerstick hemoglobin today in office was down to 7.7.  I believe the patient has an occult GI bleed  causing her profound anemia.  On her recent lab work she also had an elevated globulin level which raises the concern about possible bone marrow malignancy such as multiple myeloma.  I have an SPEP pending.  We discussed going to the emergency room to expedite workup versus seeing GI as an outpatient.  Patient prefers to see GI as an outpatient. Past Medical History:  Diagnosis Date   Anemia    Anxiety    nervous sometimes, denies panica ttacks    Arthritis    knees ?   Blood transfusion    post vaginal birth- 1960    Breast cancer (HCC)    Right Breast Cancer   Cancer (HCC)    R breast cancer   Dry mouth    Hypertension    Neuromuscular disorder (HCC)    L leg, thigh- "burns sometimes"   Shortness of breath    sometimes    Use of letrozole (Femara) 05/2010   neoadjuvant femara therapy since 1/212   Past Surgical History:  Procedure Laterality Date   ABDOMINAL HYSTERECTOMY     BREAST LUMPECTOMY     BREAST SURGERY     Right   jp drains     s/p masectomy 06/11/11/ Dr. [name], 2 jp drains right chest wall   MASTECTOMY Right    malignant   MASTECTOMY W/ SENTINEL NODE BIOPSY  06/11/2011/Right BReast   Procedure: MASTECTOMY WITH SENTINEL LYMPH NODE BIOPSY;  Surgeon: Ernestene Mention, MD;  Location: [facility] OR;  Service: General;  Laterality: Right;  Right total mastectomy and sentinel lymph node biopsy using lymphatic mapping and blue dye injection.   TUBAL LIGATION     [place] FINE NEEDLE ASPIRATION WO/ W SMEAR  05/22/10   Left Breast- cyst or abscess    Current Outpatient Medications on File Prior to Visit  Medication Sig Dispense Refill   amLODipine (NORVASC) 5 MG tablet TAKE 2 TABLETS BY MOUTH EVERY DAY EQUAL 10MG  PER DAY 90 tablet 3   B Complex-C-Folic Acid (MULTIVITAMIN, STRESS FORMULA) tablet Take 1 tablet by mouth daily. gummie bear MVT     Calcium Carbonate-Vit D-Min (CALTRATE PLUS PO) Take by mouth. 1200MG  OF CALCIUM & 800IU OF VITAMIN D     ezetimibe (ZETIA) 10 MG tablet Take  1 tablet (  10 mg total) by mouth daily. (Patient not taking: Reported on 01/11/2023) 90 tablet 3   furosemide (LASIX) 40 MG tablet Take 1 tablet (40 mg total) by mouth daily as needed. 30 tablet 3   Iron, Ferrous Sulfate, 325 (65 Fe) MG TABS Take 325 mg by mouth daily. 30 tablet 0   levocetirizine (XYZAL ALLERGY 24HR) 5 MG tablet Take 1 tablet (5 mg total) by mouth every evening. 30 tablet 0   No current facility-administered medications on file prior to visit.   Allergies  Allergen Reactions   Fosamax [Alendronate Sodium] Other (See Comments)    Aches and pains   Social History   Socioeconomic History   Marital status: Single    Spouse name: Not on file   Number of children: 5   Years of education: Not on file   Highest education level: Not on file  Occupational History   Occupation: Retired  Tobacco Use   Smoking status: Never   Smokeless tobacco: Never  Substance and Sexual Activity   Alcohol use: No   Drug use: No   Sexual activity: Not Currently    Birth control/protection: Post-menopausal  Other Topics Concern   Not on file  Social History Narrative   Not on file   Social Determinants of Health   Financial Resource Strain: Low Risk  (07/25/2022)   Overall Financial Resource Strain (CARDIA)    Difficulty of Paying Living Expenses: Not hard at all  Food Insecurity: No Food Insecurity (07/25/2022)   Hunger Vital Sign    Worried About Running Out of Food in the Last Year: Never true    Ran Out of Food in the Last Year: Never true  Transportation Needs: No Transportation Needs (07/25/2022)   PRAPARE - Administrator, Civil Service (Medical): No    Lack of Transportation (Non-Medical): No  Physical Activity: Sufficiently Active (07/25/2022)   Exercise Vital Sign    Days of Exercise per Week: 7 days    Minutes of Exercise per Session: 150+ min  Stress: No Stress Concern Present (07/25/2022)   [employer] of Occupational Health - Occupational Stress  Questionnaire    Feeling of Stress : Not at all  Social Connections: Socially Isolated (07/25/2022)   Social Connection and Isolation Panel [NHANES]    Frequency of Communication with Friends and Family: More than three times a week    Frequency of Social Gatherings with Friends and Family: More than three times a week    Attends Religious Services: Never    Database administrator or Organizations: No    Attends Banker Meetings: Never    Marital Status: Divorced  Catering manager Violence: Not At Risk (07/25/2022)   Humiliation, Afraid, Rape, and Kick questionnaire    Fear of Current or Ex-Partner: No    Emotionally Abused: No    Physically Abused: No    Sexually Abused: No      Review of Systems  All other systems reviewed and are negative.      Objective:   Physical Exam Vitals reviewed.  Constitutional:      General: She is not in acute distress.    Appearance: She is obese. She is not ill-appearing, toxic-appearing or diaphoretic.  HENT:     Mouth/Throat:     Mouth: Mucous membranes are dry.  Eyes:     General: Lids are normal.     Extraocular Movements: Extraocular movements intact.     Conjunctiva/sclera: Conjunctivae normal.  Pupils: Pupils are equal, round, and reactive to light.  Neck:     Vascular: No carotid bruit.  Cardiovascular:     Rate and Rhythm: Regular rhythm. Tachycardia present.     Heart sounds: Normal heart sounds. No murmur heard.    No friction rub. No gallop.  Pulmonary:     Effort: Pulmonary effort is normal. No respiratory distress.     Breath sounds: Normal breath sounds. No stridor. No wheezing, rhonchi or rales.  Chest:     Chest wall: No tenderness.  Abdominal:     General: Bowel sounds are normal. There is no distension.     Palpations: Abdomen is soft.     Tenderness: There is no abdominal tenderness. There is no guarding.  Musculoskeletal:     Cervical back: No tenderness.     Right lower leg: No edema.      Left lower leg: No edema.  Lymphadenopathy:     Cervical: No cervical adenopathy.  Neurological:     General: No focal deficit present.     Mental Status: She is alert and oriented to person, place, and time.     Cranial Nerves: No cranial nerve deficit.     Motor: No weakness.     Gait: Gait normal.        Assessment & Plan:  Iron deficiency anemia due to chronic blood loss - Plan: Protein electrophoresis, serum, COMPLETE METABOLIC PANEL WITH GFR Had a long discussion with the patient today about going to the emergency room versus seeing GI as an outpatient.  The patient is fecal occult positive and has worsening iron deficiency anemia.  I suspect a GI bleed.  Patient needs EGD and colonoscopy as soon as possible.  I will place a stat GI referral.  She prefers to do this as an outpatient rather than going to the hospital.  I explained to the patient and her daughter that if she develops shortness of breath or chest pain or worsening fatigue she needs to go to the emergency room immediately.  I will see the patient back on Monday to recheck her hemoglobin.  If approaching 7 she will need to go to the hospital.  Continue iron supplementation.  Check SPEP and CMP to evaluate for multiple myeloma as a potential cause of her anemia as well

## 2023-02-01 NOTE — Progress Notes (Addendum)
Rathore MD notified regarding Pt Hgb of 6.7. Awaiting orders. Nursing plan of care ongoing.

## 2023-02-01 NOTE — Consult Note (Signed)
Urology Consult    Reason for consult: right renal mass  History of Present Illness: Diane Paul is a 82 y.o. F who presnted to the ED for symptomatic anemia. Guaiac was positive for blood. CT scan obtained in ED incidentally identified an 11 cm mass in the right kidney consistent with RCC. There are also a few nodules on the lower lung fields.    Past Medical History:  Diagnosis Date   Anemia    Anxiety    nervous sometimes, denies panica ttacks    Arthritis    knees ?   Blood transfusion    post vaginal birth- 1960    Breast cancer (HCC)    Right Breast Cancer   Cancer (HCC)    R breast cancer   Dry mouth    Hypertension    Neuromuscular disorder (HCC)    L leg, thigh- "burns sometimes"   Shortness of breath    sometimes    Use of letrozole (Femara) 05/2010   neoadjuvant femara therapy since 1/212    Past Surgical History:  Procedure Laterality Date   ABDOMINAL HYSTERECTOMY     BREAST LUMPECTOMY     BREAST SURGERY     Right   jp drains     s/p masectomy 06/11/11/ Dr. [name], 2 jp drains right chest wall   MASTECTOMY Right    malignant   MASTECTOMY W/ SENTINEL NODE BIOPSY  06/11/2011/Right BReast   Procedure: MASTECTOMY WITH SENTINEL LYMPH NODE BIOPSY;  Surgeon: Ernestene Mention, MD;  Location: [facility] OR;  Service: General;  Laterality: Right;  Right total mastectomy and sentinel lymph node biopsy using lymphatic mapping and blue dye injection.   TUBAL LIGATION     [place] FINE NEEDLE ASPIRATION WO/ W SMEAR  05/22/10   Left Breast- cyst or abscess      Current Hospital Medications:  Home meds:  No current facility-administered medications on file prior to encounter.   Current Outpatient Medications on File Prior to Encounter  Medication Sig Dispense Refill   amLODipine (NORVASC) 5 MG tablet TAKE 2 TABLETS BY MOUTH EVERY DAY EQUAL 10MG  PER DAY 90 tablet 3   B Complex-C-Folic Acid (MULTIVITAMIN, STRESS FORMULA) tablet Take 1 tablet by mouth daily. gummie bear MVT      Calcium Carbonate-Vit D-Min (CALTRATE PLUS PO) Take by mouth. 1200MG  OF CALCIUM & 800IU OF VITAMIN D     ezetimibe (ZETIA) 10 MG tablet Take 1 tablet (10 mg total) by mouth daily. 90 tablet 3   furosemide (LASIX) 40 MG tablet Take 1 tablet (40 mg total) by mouth daily as needed. 30 tablet 3   Iron, Ferrous Sulfate, 325 (65 Fe) MG TABS Take 325 mg by mouth daily. 30 tablet 0   levocetirizine (XYZAL ALLERGY 24HR) 5 MG tablet Take 1 tablet (5 mg total) by mouth every evening. 30 tablet 0     Scheduled Meds:   HYDROmorphone (DILAUDID) injection  0.5 mg Intravenous Once   potassium chloride  40 mEq Oral Once   sodium chloride flush  3 mL Intravenous Q12H   Continuous Infusions: PRN Meds:.acetaminophen  Allergies:  Allergies  Allergen Reactions   Fosamax [Alendronate Sodium] Other (See Comments)    Aches and pains    Family History  Problem Relation Age of Onset   Breast cancer Sister    Anesthesia problems Neg Hx    Hypotension Neg Hx    Malignant hyperthermia Neg Hx    Pseudochol deficiency Neg Hx  Social History:  reports that she has never smoked. She has never used smokeless tobacco. She reports that she does not drink alcohol and does not use drugs.  ROS: A complete review of systems was performed.  All systems are negative except for pertinent findings as noted.  Physical Exam:  Vital signs in last 24 hours: Temp:  [98 F (36.7 C)-98.6 F (37 C)] 98 F (36.7 C) (09/27 1713) Pulse Rate:  [96-117] 96 (09/27 1713) Resp:  [22-33] 22 (09/27 1713) BP: (113-128)/(68-88) 124/68 (09/27 1713) SpO2:  [95 %-99 %] 96 % (09/27 1713) Weight:  [88 kg] 88 kg (09/27 1432) Constitutional:  Alert and oriented, No acute distress Cardiovascular: Regular rate and rhythm Respiratory: Normal respiratory effort, Lungs clear bilaterally GI: Abdomen is soft, nontender, nondistended, no abdominal masses GU: No CVA tenderness*** Neurologic: Grossly intact, no focal  deficits Psychiatric: Normal mood and affect  Laboratory Data:  Recent Labs    02/01/23 1515  WBC 14.4*  HGB 7.0*  HCT 24.1*  PLT 599*    Recent Labs    02/01/23 1515  NA 136  K 3.4*  CL 99  GLUCOSE 110*  BUN 11  CALCIUM 8.3*  CREATININE 1.05*     Results for orders placed or performed during the hospital encounter of 02/01/23 (from the past 24 hour(s))  Type and screen [facility]     Status: None   Collection Time: 02/01/23  3:00 PM  Result Value Ref Range   ABO/RH(D) A POS    Antibody Screen NEG    Sample Expiration      02/04/2023,2359 Performed at [facility], [address] [place], [place] [address]   ABO/Rh     Status: None   Collection Time: 02/01/23  3:12 PM  Result Value Ref Range   ABO/RH(D)      A POS Performed at [facility], [address] [place], [place] [address]   Comprehensive metabolic panel     Status: Abnormal   Collection Time: 02/01/23  3:15 PM  Result Value Ref Range   Sodium 136 135 - 145 mmol/L   Potassium 3.4 (L) 3.5 - 5.1 mmol/L   Chloride 99 98 - 111 mmol/L   CO2 25 22 - 32 mmol/L   Glucose, Bld 110 (H) 70 - 99 mg/dL   BUN 11 8 - 23 mg/dL   Creatinine, Ser 0.98 (H) 0.44 - 1.00 mg/dL   Calcium 8.3 (L) 8.9 - 10.3 mg/dL   Total Protein 7.1 6.5 - 8.1 g/dL   Albumin 1.9 (L) 3.5 - 5.0 g/dL   AST 19 15 - 41 U/L   ALT 20 0 - 44 U/L   Alkaline Phosphatase 80 38 - 126 U/L   Total Bilirubin 0.7 0.3 - 1.2 mg/dL   GFR, Estimated 53 (L) >60 mL/min   Anion gap 12 5 - 15  CBC     Status: Abnormal   Collection Time: 02/01/23  3:15 PM  Result Value Ref Range   WBC 14.4 (H) 4.0 - 10.5 K/uL   RBC 3.19 (L) 3.87 - 5.11 MIL/uL   Hemoglobin 7.0 (L) 12.0 - 15.0 g/dL   HCT 11.9 (L) 14.7 - 82.9 %   MCV 75.5 (L) 80.0 - 100.0 fL   MCH 21.9 (L) 26.0 - 34.0 pg   MCHC 29.0 (L) 30.0 - 36.0 g/dL   RDW 56.2 (H) 13.0 - 86.5 %   Platelets 599 (H) 150 - 400 K/uL  nRBC 0.0 0.0 - 0.2 %   No results found for this or  any previous visit (from the past 240 hour(s)).  Renal Function: Recent Labs    02/01/23 1515  CREATININE 1.05*   Estimated Creatinine Clearance: 47.9 mL/min (A) (by C-G formula based on SCr of 1.05 mg/dL (H)).  Radiologic Imaging: CT Angio Abd/Pel W and/or Wo Contrast  Result Date: 02/01/2023 CLINICAL DATA:  Lower GI bleed EXAM: CTA ABDOMEN AND PELVIS WITHOUT AND WITH CONTRAST TECHNIQUE: Multidetector CT imaging of the abdomen and pelvis was performed using the standard protocol during bolus administration of intravenous contrast. Multiplanar reconstructed images and MIPs were obtained and reviewed to evaluate the vascular anatomy. RADIATION DOSE REDUCTION: This exam was performed according to the departmental dose-optimization program which includes automated exposure control, adjustment of the mA and/or kV according to patient size and/or use of iterative reconstruction technique. CONTRAST:  75mL OMNIPAQUE IOHEXOL 350 MG/ML SOLN COMPARISON:  06/03/2010 FINDINGS: VASCULAR Aorta: Normal caliber aorta without aneurysm, dissection, vasculitis or significant stenosis. Scattered calcifications. Celiac: Patent without evidence of aneurysm, dissection, vasculitis or significant stenosis. SMA: Patent without evidence of aneurysm, dissection, vasculitis or significant stenosis. Renals: Both renal arteries are patent without evidence of aneurysm, dissection, vasculitis, fibromuscular dysplasia or significant stenosis. IMA: Patent without evidence of aneurysm, dissection, vasculitis or significant stenosis. Inflow: Patent without evidence of aneurysm, dissection, vasculitis or significant stenosis. Scattered calcifications. Proximal Outflow: Bilateral common femoral and visualized portions of the superficial and profunda femoral arteries are patent without evidence of aneurysm, dissection, vasculitis or significant stenosis. Veins: No obvious venous abnormality within the limitations of this arterial phase  study. Review of the MIP images confirms the above findings. NON-VASCULAR Lower chest: 8 mm well-circumscribed rounded nodule in the left lower lobe, new since 20/12. Subpleural nodular area in the left lower lobe measures 11 mm on image 15. Left lower lobe nodule on image 19 measures 8 mm. Right middle lobe nodule on image 9 measures 4 mm. No effusions. Hepatobiliary: No focal hepatic abnormality. Multiple gallstones within the gallbladder. No biliary ductal dilatation. Pancreas: No focal abnormality or ductal dilatation. Spleen: No focal abnormality.  Normal size. Adrenals/Urinary Tract: Adrenal glands normal. Large mass off the mid to lower pole of the right kidney with areas of enhancement concerning for renal cell carcinoma. This is new since prior study. This measures 11.1 x 9.1 x 6.6 cm. No suspicious left renal abnormality. No hydronephrosis. Urinary bladder unremarkable. Stomach/Bowel: Left colonic diverticulosis. No active diverticulitis. No active contrast extravasation to localize GI bleed. Stomach and small bowel decompressed, unremarkable. Lymphatic: No adenopathy. Reproductive: Prior hysterectomy.  No adnexal masses. Other: No free fluid or free air. Musculoskeletal: No acute bony abnormality. IMPRESSION: VASCULAR No contrast extravasation to localize GI bleed. No evidence of aortic aneurysm or dissection. NON-VASCULAR Approximately 11 cm enhancing mass involving the mid to lower pole of the right kidney most compatible with renal cell carcinoma. Recommend urologic consultation. Cholelithiasis. Left colonic diverticulosis. Electronically Signed   By: Charlett Nose M.D.   On: 02/01/2023 19:12    I independently reviewed the above imaging studies.  Impression/Recommendation: 82 yo F admitted to [facility] for symptomatic anemia, found to have large right renal mass.   --chest imaging pending --discussed with patient that likely it wil be necessary to remove her kidney. She should follow up as an  outpatient to discuss further.   Irine Seal 02/01/2023, 8:29 PM  [facility]  Pager: [phone]   CC: ***

## 2023-02-01 NOTE — ED Triage Notes (Signed)
Pt arrives via POV from home with rectal bleeding that began approx 2 weeks ago. Pts hgb was 7 and sent here for blood transfusion. Pt reports some SOB and some chest pressure for the last two weeks as well. Pt denies pain. Awake, alert, appropriate.

## 2023-02-01 NOTE — ED Notes (Signed)
Patient transported to CT 

## 2023-02-01 NOTE — Addendum Note (Signed)
Addended by: Venia Carbon K on: 02/01/2023 12:40 PM   Modules accepted: Orders

## 2023-02-01 NOTE — H&P (Signed)
History and Physical    Diane Paul ZOX:[id number] DOB: 1940-07-14 DOA: 02/01/2023  PCP: Donita Brooks, MD   Patient coming from: Home   Chief Complaint:  Chief Complaint  Patient presents with   Rectal Bleeding    HPI:  Diane Paul is a 82 y.o. female with hx of breast cancer with history of mastectomy, hypertension, iron deficiency anemia, who was referred to the ED by her primary care doctor after finding of anemia on outpatient labs. Baseline Hemoglobin around 12.  Last week had PCP visit with hemoglobin 8.4 -> 7 on repeat today.  Rectal exam was guaiac positive by PCP.  Patient acknowledges 3 weeks of fatigue, dyspnea on exertion, and weight loss of unknown quantity.  She denies any abdominal pain, black or bloody stools.  No bleeding from other sites.  She is not on anticoagulants.  Only medicine is iron tablet.    Review of Systems:  ROS complete and negative except as marked above   Allergies  Allergen Reactions   Fosamax [Alendronate Sodium] Other (See Comments)    Aches and pains    Prior to Admission medications   Medication Sig Start Date End Date Taking? Authorizing Provider  amLODipine (NORVASC) 5 MG tablet TAKE 2 TABLETS BY MOUTH EVERY DAY EQUAL 10MG  PER DAY 07/19/22   Donita Brooks, MD  B Complex-C-Folic Acid (MULTIVITAMIN, STRESS FORMULA) tablet Take 1 tablet by mouth daily. gummie bear MVT    [provider]  Calcium Carbonate-Vit D-Min (CALTRATE PLUS PO) Take by mouth. 1200MG  OF CALCIUM & 800IU OF VITAMIN D    [provider]  ezetimibe (ZETIA) 10 MG tablet Take 1 tablet (10 mg total) by mouth daily. 12/19/21   Donita Brooks, MD  furosemide (LASIX) 40 MG tablet Take 1 tablet (40 mg total) by mouth daily as needed. 09/27/20   Donita Brooks, MD  Iron, Ferrous Sulfate, 325 (65 Fe) MG TABS Take 325 mg by mouth daily. 01/14/23   Donita Brooks, MD  levocetirizine (XYZAL ALLERGY 24HR) 5 MG tablet Take 1 tablet (5 mg total) by  mouth every evening. 12/15/21   Donita Brooks, MD    Past Medical History:  Diagnosis Date   Anemia    Anxiety    nervous sometimes, denies panica ttacks    Arthritis    knees ?   Blood transfusion    post vaginal birth- 1960    Breast cancer (HCC)    Right Breast Cancer   Cancer (HCC)    R breast cancer   Dry mouth    Hypertension    Neuromuscular disorder (HCC)    L leg, thigh- "burns sometimes"   Shortness of breath    sometimes    Use of letrozole (Femara) 05/2010   neoadjuvant femara therapy since 1/212    Past Surgical History:  Procedure Laterality Date   ABDOMINAL HYSTERECTOMY     BREAST LUMPECTOMY     BREAST SURGERY     Right   jp drains     s/p masectomy 06/11/11/ Dr. [name], 2 jp drains right chest wall   MASTECTOMY Right    malignant   MASTECTOMY W/ SENTINEL NODE BIOPSY  06/11/2011/Right BReast   Procedure: MASTECTOMY WITH SENTINEL LYMPH NODE BIOPSY;  Surgeon: Ernestene Mention, MD;  Location: [facility] OR;  Service: General;  Laterality: Right;  Right total mastectomy and sentinel lymph node biopsy using lymphatic mapping and blue dye injection.   TUBAL LIGATION  [place] FINE NEEDLE ASPIRATION WO/ W SMEAR  05/22/10   Left Breast- cyst or abscess      reports that she has never smoked. She has never used smokeless tobacco. She reports that she does not drink alcohol and does not use drugs.  Family History  Problem Relation Age of Onset   Breast cancer Sister    Anesthesia problems Neg Hx    Hypotension Neg Hx    Malignant hyperthermia Neg Hx    Pseudochol deficiency Neg Hx      Physical Exam: Vitals:   02/01/23 1435 02/01/23 1515 02/01/23 1516 02/01/23 1713  BP:  113/78  124/68  Pulse:  (!) 103  96  Resp: (!) 28 (!) 33  (!) 22  Temp:    98 F (36.7 C)  TempSrc:    Oral  SpO2:  95% 99% 96%  Weight:      Height:        Gen: Awake, alert, NAD   CV: Regular, normal S1, S2, 1/6 SEM Resp: Normal WOB, CTAB  Abd: Flat, normoactive,  nontender MSK: Symmetric, trace edema near the ankles Skin: No rashes or lesions to exposed skin  Neuro: Alert and interactive  Psych: Affect is down, appropriate    Data review:   Labs reviewed, notable for:   Hemoglobin 7 (baseline 12), MCV 75 WBC 14 Platelets 599 K3.4 Albumin 1.9   Micro:  Results for orders placed or performed during the hospital encounter of 06/01/11  Surgical pcr screen     Status: None   Collection Time: 06/01/11  1:13 PM   Specimen: Nasal Swab  Result Value Ref Range Status   MRSA, PCR NEGATIVE NEGATIVE Final   Staphylococcus aureus NEGATIVE NEGATIVE Final    Comment:        The Xpert SA Assay (FDA approved for NASAL specimens only), is one component of a comprehensive surveillance program.  It is not intended to diagnose infection nor to guide or monitor treatment.    Imaging reviewed:  CT Angio Abd/Pel W and/or Wo Contrast  Result Date: 02/01/2023 CLINICAL DATA:  Lower GI bleed EXAM: CTA ABDOMEN AND PELVIS WITHOUT AND WITH CONTRAST TECHNIQUE: Multidetector CT imaging of the abdomen and pelvis was performed using the standard protocol during bolus administration of intravenous contrast. Multiplanar reconstructed images and MIPs were obtained and reviewed to evaluate the vascular anatomy. RADIATION DOSE REDUCTION: This exam was performed according to the departmental dose-optimization program which includes automated exposure control, adjustment of the mA and/or kV according to patient size and/or use of iterative reconstruction technique. CONTRAST:  75mL OMNIPAQUE IOHEXOL 350 MG/ML SOLN COMPARISON:  06/03/2010 FINDINGS: VASCULAR Aorta: Normal caliber aorta without aneurysm, dissection, vasculitis or significant stenosis. Scattered calcifications. Celiac: Patent without evidence of aneurysm, dissection, vasculitis or significant stenosis. SMA: Patent without evidence of aneurysm, dissection, vasculitis or significant stenosis. Renals: Both renal  arteries are patent without evidence of aneurysm, dissection, vasculitis, fibromuscular dysplasia or significant stenosis. IMA: Patent without evidence of aneurysm, dissection, vasculitis or significant stenosis. Inflow: Patent without evidence of aneurysm, dissection, vasculitis or significant stenosis. Scattered calcifications. Proximal Outflow: Bilateral common femoral and visualized portions of the superficial and profunda femoral arteries are patent without evidence of aneurysm, dissection, vasculitis or significant stenosis. Veins: No obvious venous abnormality within the limitations of this arterial phase study. Review of the MIP images confirms the above findings. NON-VASCULAR Lower chest: 8 mm well-circumscribed rounded nodule in the left lower lobe, new since 20/12. Subpleural nodular area in the  left lower lobe measures 11 mm on image 15. Left lower lobe nodule on image 19 measures 8 mm. Right middle lobe nodule on image 9 measures 4 mm. No effusions. Hepatobiliary: No focal hepatic abnormality. Multiple gallstones within the gallbladder. No biliary ductal dilatation. Pancreas: No focal abnormality or ductal dilatation. Spleen: No focal abnormality.  Normal size. Adrenals/Urinary Tract: Adrenal glands normal. Large mass off the mid to lower pole of the right kidney with areas of enhancement concerning for renal cell carcinoma. This is new since prior study. This measures 11.1 x 9.1 x 6.6 cm. No suspicious left renal abnormality. No hydronephrosis. Urinary bladder unremarkable. Stomach/Bowel: Left colonic diverticulosis. No active diverticulitis. No active contrast extravasation to localize GI bleed. Stomach and small bowel decompressed, unremarkable. Lymphatic: No adenopathy. Reproductive: Prior hysterectomy.  No adnexal masses. Other: No free fluid or free air. Musculoskeletal: No acute bony abnormality. IMPRESSION: VASCULAR No contrast extravasation to localize GI bleed. No evidence of aortic aneurysm  or dissection. NON-VASCULAR Approximately 11 cm enhancing mass involving the mid to lower pole of the right kidney most compatible with renal cell carcinoma. Recommend urologic consultation. Cholelithiasis. Left colonic diverticulosis. Electronically Signed   By: Charlett Nose M.D.   On: 02/01/2023 19:12     ED Course:  Treated with 1 L IV fluid   Assessment/Plan:  82 y.o. female with hx hx of breast cancer with history of mastectomy, hypertension, iron deficiency anemia, who was referred to the ED by her primary care doctor after finding of anemia on outpatient labs.   Symptomatic anemia, microcytic Guaiac positive stool without overt bleeding History of iron deficiency anemia Baseline hemoglobin 12. Down to 7 on arrival. Guaiac positive at PCP office.  No overt bleeding noted.  CTA with no bleeding sites.  History iron deficiency anemia, continues taking iron tablets.  Etiology suspected nutritional /IDA, possible slow GI bleed, possible paraneoplastic syndrome with RCC.  -Type and screen, consented for blood.  -Transfusion threshold hemoglobin < 7 -Check iron panel, reticulocytes, LDH, haptoglobin.  Per PCP note had sent a SPEP although I do not see this pending. -Recent B12 outpatient was within normal limits -Clear liquid diet for now -Pantoprazole 40 mg IV twice daily to cover in case there is a upper GI bleed -GI consult in morning, appreciate input on timing of endoscopies.  Messaged Dr. Elnoria Howard via secure chat  Incidental finding right renal mass consistent with RCC Incidental pulmonary nodules in lung bases CTA with incidental finding of right renal mass 11.1 x 9.1 x 6.6 cm concerning for RCC.  I discussed this finding with patient and family. also notable for pulmonary nodules seen in the lung bases.  -Discussed her case with urology Dr. Lafonda Mosses, who will see tomorrow. Recommends CT chest to eval for thoracic malignancy  -CT Chest with IV contrast  -Check UA for blood    Thrombocytosis -Suspect reactive in setting of IDA versus paraneoplastic  Leukocytosis -Suspect related to RCC  Weight loss  Review of weight trend shows 20 pound weight loss since 6/'24.  Albumin is 1.9.  -Nutrition consult  Hypokalemia, asymptomatic -Repleted  Chronic medical problems: Breast cancer with history of mastectomy: Outpatient oncology follow-up Hypertension: No longer on antihypertensives   Body mass index is 29.5 kg/m.    DVT prophylaxis:  SCDs Code Status:  Full Code Diet:  Diet Orders (From admission, onward)     Start     Ordered   02/01/23 1434  Diet NPO time specified  Diet effective now  02/01/23 1433           Family Communication: Yes discussed with family at the bedside Consults: Urology Admission status:   Observation, Telemetry bed  Severity of Illness: The appropriate patient status for this patient is OBSERVATION. Observation status is judged to be reasonable and necessary in order to provide the required intensity of service to ensure the patient's safety. The patient's presenting symptoms, physical exam findings, and initial radiographic and laboratory data in the context of their medical condition is felt to place them at decreased risk for further clinical deterioration. Furthermore, it is anticipated that the patient will be medically stable for discharge from the hospital within 2 midnights of admission.    Dolly Rias, MD [facility]  How to contact the [facility] Attending or Consulting provider 7A - 7P or covering provider during after hours 7P -7A, for this patient.  Check the care team in [facility] and look for a) attending/consulting [facility] provider listed and b) the [facility] team listed Log into [email or web address] and use [place]'s universal password to access. If you do not have the password, please contact the hospital operator. Locate the [facility] provider you are looking for under [facility] and page to a number that you  can be directly reached. If you still have difficulty reaching the provider, please page the [facility] (Director on Call) for the Hospitalists listed on amion for assistance.  02/01/2023, 8:08 PM

## 2023-02-02 DIAGNOSIS — Z9011 Acquired absence of right breast and nipple: Secondary | ICD-10-CM | POA: Diagnosis not present

## 2023-02-02 DIAGNOSIS — C649 Malignant neoplasm of unspecified kidney, except renal pelvis: Secondary | ICD-10-CM | POA: Diagnosis not present

## 2023-02-02 DIAGNOSIS — Z905 Acquired absence of kidney: Secondary | ICD-10-CM | POA: Diagnosis not present

## 2023-02-02 DIAGNOSIS — R195 Other fecal abnormalities: Secondary | ICD-10-CM | POA: Diagnosis not present

## 2023-02-02 DIAGNOSIS — Z79899 Other long term (current) drug therapy: Secondary | ICD-10-CM | POA: Diagnosis not present

## 2023-02-02 DIAGNOSIS — K449 Diaphragmatic hernia without obstruction or gangrene: Secondary | ICD-10-CM | POA: Diagnosis not present

## 2023-02-02 DIAGNOSIS — D630 Anemia in neoplastic disease: Secondary | ICD-10-CM | POA: Diagnosis not present

## 2023-02-02 DIAGNOSIS — Z888 Allergy status to other drugs, medicaments and biological substances status: Secondary | ICD-10-CM | POA: Diagnosis not present

## 2023-02-02 DIAGNOSIS — K625 Hemorrhage of anus and rectum: Secondary | ICD-10-CM | POA: Diagnosis not present

## 2023-02-02 DIAGNOSIS — D122 Benign neoplasm of ascending colon: Secondary | ICD-10-CM | POA: Diagnosis not present

## 2023-02-02 DIAGNOSIS — C641 Malignant neoplasm of right kidney, except renal pelvis: Secondary | ICD-10-CM | POA: Diagnosis not present

## 2023-02-02 DIAGNOSIS — M170 Bilateral primary osteoarthritis of knee: Secondary | ICD-10-CM | POA: Diagnosis not present

## 2023-02-02 DIAGNOSIS — D649 Anemia, unspecified: Secondary | ICD-10-CM | POA: Diagnosis not present

## 2023-02-02 DIAGNOSIS — K219 Gastro-esophageal reflux disease without esophagitis: Secondary | ICD-10-CM | POA: Diagnosis not present

## 2023-02-02 DIAGNOSIS — K3189 Other diseases of stomach and duodenum: Secondary | ICD-10-CM | POA: Diagnosis not present

## 2023-02-02 DIAGNOSIS — Z853 Personal history of malignant neoplasm of breast: Secondary | ICD-10-CM | POA: Diagnosis not present

## 2023-02-02 DIAGNOSIS — D75838 Other thrombocytosis: Secondary | ICD-10-CM | POA: Diagnosis not present

## 2023-02-02 DIAGNOSIS — D75839 Thrombocytosis, unspecified: Secondary | ICD-10-CM | POA: Diagnosis not present

## 2023-02-02 DIAGNOSIS — E876 Hypokalemia: Secondary | ICD-10-CM | POA: Diagnosis not present

## 2023-02-02 DIAGNOSIS — R5383 Other fatigue: Secondary | ICD-10-CM | POA: Diagnosis not present

## 2023-02-02 DIAGNOSIS — C7801 Secondary malignant neoplasm of right lung: Secondary | ICD-10-CM | POA: Diagnosis not present

## 2023-02-02 DIAGNOSIS — K2950 Unspecified chronic gastritis without bleeding: Secondary | ICD-10-CM | POA: Diagnosis not present

## 2023-02-02 DIAGNOSIS — D509 Iron deficiency anemia, unspecified: Secondary | ICD-10-CM | POA: Diagnosis not present

## 2023-02-02 DIAGNOSIS — K2970 Gastritis, unspecified, without bleeding: Secondary | ICD-10-CM | POA: Diagnosis not present

## 2023-02-02 DIAGNOSIS — K635 Polyp of colon: Secondary | ICD-10-CM | POA: Diagnosis not present

## 2023-02-02 DIAGNOSIS — D126 Benign neoplasm of colon, unspecified: Secondary | ICD-10-CM | POA: Diagnosis not present

## 2023-02-02 DIAGNOSIS — K5730 Diverticulosis of large intestine without perforation or abscess without bleeding: Secondary | ICD-10-CM | POA: Diagnosis not present

## 2023-02-02 DIAGNOSIS — C7802 Secondary malignant neoplasm of left lung: Secondary | ICD-10-CM | POA: Diagnosis not present

## 2023-02-02 DIAGNOSIS — Z803 Family history of malignant neoplasm of breast: Secondary | ICD-10-CM | POA: Diagnosis not present

## 2023-02-02 DIAGNOSIS — D72829 Elevated white blood cell count, unspecified: Secondary | ICD-10-CM | POA: Diagnosis not present

## 2023-02-02 DIAGNOSIS — I10 Essential (primary) hypertension: Secondary | ICD-10-CM | POA: Diagnosis not present

## 2023-02-02 DIAGNOSIS — K31A0 Gastric intestinal metaplasia, unspecified: Secondary | ICD-10-CM | POA: Diagnosis not present

## 2023-02-02 LAB — PHOSPHORUS: Phosphorus: 3.7 mg/dL (ref 2.5–4.6)

## 2023-02-02 LAB — RETICULOCYTES
Immature Retic Fract: 26.9 % — ABNORMAL HIGH (ref 2.3–15.9)
RBC.: 3.96 MIL/uL (ref 3.87–5.11)
Retic Count, Absolute: 57.8 10*3/uL (ref 19.0–186.0)
Retic Ct Pct: 1.5 % (ref 0.4–3.1)

## 2023-02-02 LAB — BASIC METABOLIC PANEL
Anion gap: 13 (ref 5–15)
BUN: 9 mg/dL (ref 8–23)
CO2: 24 mmol/L (ref 22–32)
Calcium: 8.6 mg/dL — ABNORMAL LOW (ref 8.9–10.3)
Chloride: 98 mmol/L (ref 98–111)
Creatinine, Ser: 0.84 mg/dL (ref 0.44–1.00)
GFR, Estimated: >60 mL/min (ref >60)
Glucose, Bld: 145 mg/dL — ABNORMAL HIGH (ref 70–99)
Potassium: 3.4 mmol/L — ABNORMAL LOW (ref 3.5–5.1)
Sodium: 135 mmol/L (ref 135–145)

## 2023-02-02 LAB — HEMOGLOBIN AND HEMATOCRIT, BLOOD
HCT: 30.2 % — ABNORMAL LOW (ref 36.0–46.0)
HCT: 35.9 % — ABNORMAL LOW (ref 36.0–46.0)
Hemoglobin: 9.2 g/dL — ABNORMAL LOW (ref 12.0–15.0)
Hemoglobin: 10.6 g/dL — ABNORMAL LOW (ref 12.0–15.0)

## 2023-02-02 LAB — PROTIME-INR
INR: 1.6 — ABNORMAL HIGH (ref 0.8–1.2)
Prothrombin Time: 18.8 s — ABNORMAL HIGH (ref 11.4–15.2)

## 2023-02-02 LAB — IRON AND TIBC
Iron: 47 ug/dL (ref 28–170)
Saturation Ratios: 37 % — ABNORMAL HIGH (ref 10.4–31.8)
TIBC: 127 ug/dL — ABNORMAL LOW (ref 250–450)
UIBC: 80 ug/dL

## 2023-02-02 LAB — PREPARE RBC (CROSSMATCH)

## 2023-02-02 LAB — MAGNESIUM: Magnesium: 1.9 mg/dL (ref 1.7–2.4)

## 2023-02-02 LAB — LACTATE DEHYDROGENASE: LDH: 150 U/L (ref 98–192)

## 2023-02-02 LAB — FOLATE: Folate: 9.1 ng/mL (ref >5.9)

## 2023-02-02 LAB — TRANSFERRIN: Transferrin: 86 mg/dL — ABNORMAL LOW (ref 192–382)

## 2023-02-02 LAB — FERRITIN: Ferritin: 747 ng/mL — ABNORMAL HIGH (ref 11–307)

## 2023-02-02 MED ORDER — SODIUM CHLORIDE 0.9% IV SOLUTION: Freq: Once | INTRAVENOUS | Status: AC

## 2023-02-02 MED ORDER — PEG 3350-KCL-NA BICARB-NACL 420 G PO SOLR
4000.0000 mL | Freq: Once | ORAL | Status: AC
  Administered 2023-02-02: 4000 mL via ORAL
  Filled 2023-02-02: qty 4000

## 2023-02-02 NOTE — H&P (View-Only) (Signed)
Reason for Consult: Symptomatic anemia and heme positive stool Referring Physician: [facility]  Docia Barrier HPI: This is an 82 year old female with a PMH of breast cancer s/p mastectomy, arthritis, and HTN admitted for symptomatic anemia.  She started to experience progressive fatigue and DOE over the past several weeks.  On 01/11/2023 Dr. Tanya Nones, her PCP, noted a significant drop in her HGB from 12.4 g/dL (5/36/6440) down to 8.4 g/dL MCV 74.  A follow up visit on 02/01/2023 with Dr. Tanya Nones showed that her HGB declined some more down to 7.0 g/dL.  A rectal examination showed that she was heme positive.  At that time she was advised to present to the ER for further work up and evaluation. The patient denies any issues hematochezia, melena, nausea, vomiting, hematemesis, hemoptysis, or hematuria.  There were no reports of any abdominal pains.  The patient does not recall having a colonoscopy in the past.  On a mild basis she does report having some GERD issues.  Past Medical History:  Diagnosis Date   Anemia    Anxiety    nervous sometimes, denies panica ttacks    Arthritis    knees ?   Blood transfusion    post vaginal birth- 1960    Breast cancer (HCC)    Right Breast Cancer   Cancer (HCC)    R breast cancer   Dry mouth    Hypertension    Neuromuscular disorder (HCC)    L leg, thigh- "burns sometimes"   Shortness of breath    sometimes    Use of letrozole (Femara) 05/2010   neoadjuvant femara therapy since 1/212    Past Surgical History:  Procedure Laterality Date   ABDOMINAL HYSTERECTOMY     BREAST LUMPECTOMY     BREAST SURGERY     Right   jp drains     s/p masectomy 06/11/11/ Dr. [name], 2 jp drains right chest wall   MASTECTOMY Right    malignant   MASTECTOMY W/ SENTINEL NODE BIOPSY  06/11/2011/Right BReast   Procedure: MASTECTOMY WITH SENTINEL LYMPH NODE BIOPSY;  Surgeon: Ernestene Mention, MD;  Location: [facility] OR;  Service: General;  Laterality: Right;  Right  total mastectomy and sentinel lymph node biopsy using lymphatic mapping and blue dye injection.   TUBAL LIGATION     [place] FINE NEEDLE ASPIRATION WO/ W SMEAR  05/22/10   Left Breast- cyst or abscess     Family History  Problem Relation Age of Onset   Breast cancer Sister    Anesthesia problems Neg Hx    Hypotension Neg Hx    Malignant hyperthermia Neg Hx    Pseudochol deficiency Neg Hx     Social History:  reports that she has never smoked. She has never used smokeless tobacco. She reports that she does not drink alcohol and does not use drugs.  Allergies:  Allergies  Allergen Reactions   Fosamax [Alendronate Sodium] Other (See Comments)    Aches and pains    Medications: Scheduled:  ferrous sulfate  325 mg Oral Daily   pantoprazole (PROTONIX) IV  40 mg Intravenous Q12H   sodium chloride flush  3 mL Intravenous Q12H   Continuous:  Results for orders placed or performed during the hospital encounter of 02/01/23 (from the past 24 hour(s))  Type and screen [facility]     Status: None (Preliminary result)   Collection Time: 02/01/23  3:00 PM  Result Value Ref Range   ABO/RH(D)  A POS    Antibody Screen NEG    Sample Expiration 02/04/2023,2359    Unit Number [id number]    Blood Component Type RED CELLS,LR    Unit division 00    Status of Unit ISSUED    Transfusion Status OK TO TRANSFUSE    Crossmatch Result      Compatible Performed at [facility], [address], [place], [place] [address]   ABO/Rh     Status: None   Collection Time: 02/01/23  3:12 PM  Result Value Ref Range   ABO/RH(D)      A POS Performed at [facility], [address], [place], [place] [address]   Comprehensive metabolic panel     Status: Abnormal   Collection Time: 02/01/23  3:15 PM  Result Value Ref Range   Sodium 136 135 - 145 mmol/L   Potassium 3.4 (L) 3.5 - 5.1 mmol/L   Chloride 99 98 - 111 mmol/L   CO2 25 22 - 32 mmol/L   Glucose, Bld 110 (H) 70 - 99  mg/dL   BUN 11 8 - 23 mg/dL   Creatinine, Ser 6.21 (H) 0.44 - 1.00 mg/dL   Calcium 8.3 (L) 8.9 - 10.3 mg/dL   Total Protein 7.1 6.5 - 8.1 g/dL   Albumin 1.9 (L) 3.5 - 5.0 g/dL   AST 19 15 - 41 U/L   ALT 20 0 - 44 U/L   Alkaline Phosphatase 80 38 - 126 U/L   Total Bilirubin 0.7 0.3 - 1.2 mg/dL   GFR, Estimated 53 (L) >60 mL/min   Anion gap 12 5 - 15  CBC     Status: Abnormal   Collection Time: 02/01/23  3:15 PM  Result Value Ref Range   WBC 14.4 (H) 4.0 - 10.5 K/uL   RBC 3.19 (L) 3.87 - 5.11 MIL/uL   Hemoglobin 7.0 (L) 12.0 - 15.0 g/dL   HCT 30.8 (L) 65.7 - 84.6 %   MCV 75.5 (L) 80.0 - 100.0 fL   MCH 21.9 (L) 26.0 - 34.0 pg   MCHC 29.0 (L) 30.0 - 36.0 g/dL   RDW 96.2 (H) 95.2 - 84.1 %   Platelets 599 (H) 150 - 400 K/uL   nRBC 0.0 0.0 - 0.2 %  Urinalysis, Routine w reflex microscopic -Urine, Clean Catch     Status: Abnormal   Collection Time: 02/01/23  8:29 PM  Result Value Ref Range   Color, Urine YELLOW YELLOW   APPearance CLEAR CLEAR   Specific Gravity, Urine >1.046 (H) 1.005 - 1.030   pH 5.0 5.0 - 8.0   Glucose, UA NEGATIVE NEGATIVE mg/dL   Hgb urine dipstick NEGATIVE NEGATIVE   Bilirubin Urine NEGATIVE NEGATIVE   Ketones, ur NEGATIVE NEGATIVE mg/dL   Protein, ur NEGATIVE NEGATIVE mg/dL   Nitrite NEGATIVE NEGATIVE   Leukocytes,Ua NEGATIVE NEGATIVE  CBC     Status: Abnormal   Collection Time: 02/01/23  9:07 PM  Result Value Ref Range   WBC 13.8 (H) 4.0 - 10.5 K/uL   RBC 3.10 (L) 3.87 - 5.11 MIL/uL   Hemoglobin 6.7 (LL) 12.0 - 15.0 g/dL   HCT 32.4 (L) 40.1 - 02.7 %   MCV 74.2 (L) 80.0 - 100.0 fL   MCH 21.6 (L) 26.0 - 34.0 pg   MCHC 29.1 (L) 30.0 - 36.0 g/dL   RDW 25.3 (H) 66.4 - 40.3 %   Platelets 565 (H) 150 - 400 K/uL   nRBC 0.0 0.0 - 0.2 %  Prepare RBC (crossmatch)     Status: None   Collection Time: 02/02/23  3:01 AM  Result Value Ref Range   Order Confirmation      ORDER PROCESSED BY BLOOD BANK Performed at [facility], [address],  [place], [place] [address]      CT CHEST W CONTRAST  Result Date: 02/01/2023 CLINICAL DATA:  Pulmonary nodules renal mass EXAM: CT CHEST WITH CONTRAST TECHNIQUE: Multidetector CT imaging of the chest was performed during intravenous contrast administration. RADIATION DOSE REDUCTION: This exam was performed according to the departmental dose-optimization program which includes automated exposure control, adjustment of the mA and/or kV according to patient size and/or use of iterative reconstruction technique. CONTRAST:  50mL OMNIPAQUE IOHEXOL 350 MG/ML SOLN COMPARISON:  CT 02/01/2023, CT 05/26/2010 FINDINGS: Cardiovascular: Moderate aortic atherosclerosis. Mild aneurysmal dilatation of ascending aorta up to 4.1 cm. Borderline cardiomegaly. No pericardial effusion Mediastinum/Nodes: Midline trachea. 1.5 cm hypodense nodule right lobe of thyroid with calcification but present on 2012 comparison. Stability for greater than 5 years implies benignity; no biopsy or followup indicated (ref: J Am Coll Radiol. 2015 Feb;12(2): 143-50). No suspicious lymph nodes. Esophagus within normal limits. Lungs/Pleura: Lungs demonstrate no acute airspace disease or pleural effusion. Numerous bilateral pulmonary nodules. The largest lesions will be measured. Right anterior upper lobe pulmonary nodule measuring 1.4 x 1.2 cm on series 4 image 45. Posterior right upper lobe pulmonary nodule measuring 14 x 10 mm on series 4, image 54. Left upper lobe posterior pulmonary nodule measuring 1.2 x 1.2 cm on series 4, image 76. Multiple left lower lobe pulmonary nodules including 10 x 9 mm nodule on series 4, image 89 and subpleural left lung base pulmonary nodule measuring 12 by 7 mm on series 4, image 105. Multiple additional bilateral pulmonary nodules are noted. Upper Abdomen: See separately dictated CT Musculoskeletal: No acute or suspicious osseous abnormality. IMPRESSION: 1. Numerous bilateral pulmonary nodules suspicious for metastatic  disease. 2. Aortic atherosclerosis. Aortic Atherosclerosis (ICD10-I70.0). Electronically Signed   By: Jasmine Pang M.D.   On: 02/01/2023 22:03   CT Angio Abd/Pel W and/or Wo Contrast  Result Date: 02/01/2023 CLINICAL DATA:  Lower GI bleed EXAM: CTA ABDOMEN AND PELVIS WITHOUT AND WITH CONTRAST TECHNIQUE: Multidetector CT imaging of the abdomen and pelvis was performed using the standard protocol during bolus administration of intravenous contrast. Multiplanar reconstructed images and MIPs were obtained and reviewed to evaluate the vascular anatomy. RADIATION DOSE REDUCTION: This exam was performed according to the departmental dose-optimization program which includes automated exposure control, adjustment of the mA and/or kV according to patient size and/or use of iterative reconstruction technique. CONTRAST:  75mL OMNIPAQUE IOHEXOL 350 MG/ML SOLN COMPARISON:  06/03/2010 FINDINGS: VASCULAR Aorta: Normal caliber aorta without aneurysm, dissection, vasculitis or significant stenosis. Scattered calcifications. Celiac: Patent without evidence of aneurysm, dissection, vasculitis or significant stenosis. SMA: Patent without evidence of aneurysm, dissection, vasculitis or significant stenosis. Renals: Both renal arteries are patent without evidence of aneurysm, dissection, vasculitis, fibromuscular dysplasia or significant stenosis. IMA: Patent without evidence of aneurysm, dissection, vasculitis or significant stenosis. Inflow: Patent without evidence of aneurysm, dissection, vasculitis or significant stenosis. Scattered calcifications. Proximal Outflow: Bilateral common femoral and visualized portions of the superficial and profunda femoral arteries are patent without evidence of aneurysm, dissection, vasculitis or significant stenosis. Veins: No obvious venous abnormality within the limitations of this arterial phase study. Review of the MIP images confirms the above findings. NON-VASCULAR Lower chest: 8 mm  well-circumscribed rounded nodule in  the left lower lobe, new since 20/12. Subpleural nodular area in the left lower lobe measures 11 mm on image 15. Left lower lobe nodule on image 19 measures 8 mm. Right middle lobe nodule on image 9 measures 4 mm. No effusions. Hepatobiliary: No focal hepatic abnormality. Multiple gallstones within the gallbladder. No biliary ductal dilatation. Pancreas: No focal abnormality or ductal dilatation. Spleen: No focal abnormality.  Normal size. Adrenals/Urinary Tract: Adrenal glands normal. Large mass off the mid to lower pole of the right kidney with areas of enhancement concerning for renal cell carcinoma. This is new since prior study. This measures 11.1 x 9.1 x 6.6 cm. No suspicious left renal abnormality. No hydronephrosis. Urinary bladder unremarkable. Stomach/Bowel: Left colonic diverticulosis. No active diverticulitis. No active contrast extravasation to localize GI bleed. Stomach and small bowel decompressed, unremarkable. Lymphatic: No adenopathy. Reproductive: Prior hysterectomy.  No adnexal masses. Other: No free fluid or free air. Musculoskeletal: No acute bony abnormality. IMPRESSION: VASCULAR No contrast extravasation to localize GI bleed. No evidence of aortic aneurysm or dissection. NON-VASCULAR Approximately 11 cm enhancing mass involving the mid to lower pole of the right kidney most compatible with renal cell carcinoma. Recommend urologic consultation. Cholelithiasis. Left colonic diverticulosis. Electronically Signed   By: Charlett Nose M.D.   On: 02/01/2023 19:12    ROS:  As stated above in the HPI otherwise negative.  Blood pressure 126/60, pulse 84, temperature 97.8 F (36.6 C), temperature source Oral, resp. rate 18, height 5\' 8"  (1.727 m), weight 88 kg, SpO2 97%.    PE: Gen: NAD, Alert and Oriented HEENT:  [place]/AT, EOMI Neck: Supple, no LAD Lungs: CTA Bilaterally CV: RRR without M/G/R ABD: Soft, NTND, +BS Ext: No C/C/E  Assessment/Plan: 1)  IDA. 2) Heme positive stool. 3) GERD. 4) DOE.   The patient is stable and she completed one unit of PRBC.  Further evaluation with an EGD/colonoscopy is necessary.  There was a mild history of GERD and in the elderly a severe esophagitis can be present even if the clinical symptoms are mild.  There was no reported family history of a colon cancer.  Plan: 1) EGD/colonoscopy tomorrow. 2) Follow HGB and transfuse as needed.  Candance Bohlman D 02/02/2023, 7:30 AM

## 2023-02-02 NOTE — Hospital Course (Signed)
Diane Paul is an 82 yo female with PMH right breast cancer s/p mastectomy, HTN, arthritis, iron deficiency anemia who was referred to the hospital after worsening anemia on outpatient labs.  She had recently undergone evaluation with primary care on 02/01/2023 due to worsening fatigue.  Anemic workup was initiated. She had denied any abdominal pain or change in stools notably negative for melena. Hemoglobin was noted to be 7 g/dL and down trended to 6.7 g/dL.  She transfused 1 unit PRBC with improvement in hemoglobin. CT angio abdomen/pelvis was performed initially looking for possible bleeding etiology.  This was negative for evidence of GI bleed but showed underlying 11 cm right kidney mass concerning for RCC.  This was then followed by CT chest which showed numerous bilateral pulmonary nodules concerning for metastatic lesions. She was admitted for GI evaluation and urology evaluation. She underwent EGD and colonoscopy on 02/03/2023. She will follow-up outpatient with urology to discuss nephrectomy.

## 2023-02-02 NOTE — Progress Notes (Signed)
Progress Note    Diane Paul   ION:[id number]  DOB: 06/12/1940  DOA: 02/01/2023     0 PCP: Donita Brooks, MD  Initial CC: fatigue, low Hgb  Hospital Course: Diane Paul is an 82 yo female with PMH right breast cancer s/p mastectomy, HTN, arthritis, iron deficiency anemia who was referred to the hospital after worsening anemia on outpatient labs.  She had recently undergone evaluation with primary care on 02/01/2023 due to worsening fatigue.  Anemic workup was initiated. She had denied any abdominal pain or change in stools notably negative for melena. Hemoglobin was noted to be 7 g/dL and down trended to 6.7 g/dL.  She transfused 1 unit PRBC with improvement in hemoglobin. CT angio abdomen/pelvis was performed initially looking for possible bleeding etiology.  This was negative for evidence of GI bleed but showed underlying 11 cm right kidney mass concerning for RCC.  This was then followed by CT chest which showed numerous bilateral pulmonary nodules concerning for metastatic lesions. She was admitted for GI evaluation and urology evaluation.  Interval History:  Patient's family bedside when seen this morning.  Patient was appropriately concerned and tearful over processing findings of the workup thus far.  Assessment and Plan:  Microcytic anemia Iron deficiency anemia -Prior baseline hemoglobin noted to be around 12 g/dL in August 2023 but was noted to be 8.4 g/dL on 06/10/4008 -Has further down trended and noted to be as low as 6.7 g/dL after admission - Received 1 unit PRBC; hemoglobin improved to 9.2 g/dL posttransfusion -FOBT pending - Some risk of underlying PUD although other etiology may be in setting of RCC -Iron stores technically normal with slightly increased iron sat ratio, again possibly in setting of underlying RCC - continue IV PPI - LDH normal; pending haptoglobin, but lower suspicion for hemolytic anemia currently - GI planning for EGD and colonoscopy on 9/29    Incidental finding right renal mass consistent with RCC Incidental pulmonary nodules in lung bases CTA with incidental finding of right renal mass 11.1 x 9.1 x 6.6 cm concerning for RCC.  - patient and daughters aware of findings including lung nodules - appreciate urology evaluation; follow up further recommendations    Thrombocytosis -Suspect reactive in setting of IDA versus paraneoplastic   Leukocytosis -Suspect related to RCC   Weight loss  Review of weight trend shows 20 pound weight loss since 6/24.  Albumin is 1.9.  -Nutrition consult   Hypokalemia -Repleted   Chronic medical problems: Breast cancer with history of mastectomy: Outpatient oncology follow-up Hypertension: No longer on antihypertensives   Old records reviewed in assessment of this patient  Antimicrobials:   DVT prophylaxis:  SCDs Start: 02/01/23 2022   Code Status:   Code Status: Full Code  Mobility Assessment (Last 72 Hours)     Mobility Assessment     Row Name 02/02/23 0754 02/01/23 2200         Does patient have an order for bedrest or is patient medically unstable No - Continue assessment No - Continue assessment      What is the highest level of mobility based on the progressive mobility assessment? Level 4 (Walks with assist in room) - Balance while marching in place and cannot step forward and back - Complete Level 4 (Walks with assist in room) - Balance while marching in place and cannot step forward and back - Complete               Barriers to discharge:  none Disposition Plan:  Home Status is: Inpt  Objective: Blood pressure 127/63, pulse 77, temperature 97.6 F (36.4 C), temperature source Oral, resp. rate 18, height 5\' 8"  (1.727 m), weight 88 kg, SpO2 97%.  Examination:  Physical Exam Constitutional:      Appearance: Normal appearance.  HENT:     Head: Normocephalic and atraumatic.     Mouth/Throat:     Mouth: Mucous membranes are moist.  Eyes:     Extraocular  Movements: Extraocular movements intact.  Cardiovascular:     Rate and Rhythm: Normal rate and regular rhythm.  Pulmonary:     Effort: Pulmonary effort is normal. No respiratory distress.     Breath sounds: Normal breath sounds. No wheezing.  Abdominal:     General: Bowel sounds are normal. There is no distension.     Palpations: Abdomen is soft.     Tenderness: There is no abdominal tenderness.  Musculoskeletal:        General: Normal range of motion.     Cervical back: Normal range of motion and neck supple.  Skin:    General: Skin is warm and dry.  Neurological:     General: No focal deficit present.     Mental Status: She is alert.  Psychiatric:        Mood and Affect: Mood normal.      Consultants:  Urology GI  Procedures:    Data Reviewed: Results for orders placed or performed during the hospital encounter of 02/01/23 (from the past 24 hour(s))  Type and screen [facility]     Status: None (Preliminary result)   Collection Time: 02/01/23  3:00 PM  Result Value Ref Range   ABO/RH(D) A POS    Antibody Screen NEG    Sample Expiration 02/04/2023,2359    Unit Number [id number]    Blood Component Type RED CELLS,LR    Unit division 00    Status of Unit ISSUED    Transfusion Status OK TO TRANSFUSE    Crossmatch Result      Compatible Performed at [facility], [address] [place], [place] [address]   ABO/Rh     Status: None   Collection Time: 02/01/23  3:12 PM  Result Value Ref Range   ABO/RH(D)      A POS Performed at [facility], [address], [place], [place] [address]   Comprehensive metabolic panel     Status: Abnormal   Collection Time: 02/01/23  3:15 PM  Result Value Ref Range   Sodium 136 135 - 145 mmol/L   Potassium 3.4 (L) 3.5 - 5.1 mmol/L   Chloride 99 98 - 111 mmol/L   CO2 25 22 - 32 mmol/L   Glucose, Bld 110 (H) 70 - 99 mg/dL   BUN 11 8 - 23 mg/dL   Creatinine, Ser 6.21 (H) 0.44 - 1.00 mg/dL   Calcium  8.3 (L) 8.9 - 10.3 mg/dL   Total Protein 7.1 6.5 - 8.1 g/dL   Albumin 1.9 (L) 3.5 - 5.0 g/dL   AST 19 15 - 41 U/L   ALT 20 0 - 44 U/L   Alkaline Phosphatase 80 38 - 126 U/L   Total Bilirubin 0.7 0.3 - 1.2 mg/dL   GFR, Estimated 53 (L) >60 mL/min   Anion gap 12 5 - 15  CBC     Status: Abnormal   Collection Time: 02/01/23  3:15 PM  Result Value Ref Range   WBC  14.4 (H) 4.0 - 10.5 K/uL   RBC 3.19 (L) 3.87 - 5.11 MIL/uL   Hemoglobin 7.0 (L) 12.0 - 15.0 g/dL   HCT 16.1 (L) 09.6 - 04.5 %   MCV 75.5 (L) 80.0 - 100.0 fL   MCH 21.9 (L) 26.0 - 34.0 pg   MCHC 29.0 (L) 30.0 - 36.0 g/dL   RDW 40.9 (H) 81.1 - 91.4 %   Platelets 599 (H) 150 - 400 K/uL   nRBC 0.0 0.0 - 0.2 %  Urinalysis, Routine w reflex microscopic -Urine, Clean Catch     Status: Abnormal   Collection Time: 02/01/23  8:29 PM  Result Value Ref Range   Color, Urine YELLOW YELLOW   APPearance CLEAR CLEAR   Specific Gravity, Urine >1.046 (H) 1.005 - 1.030   pH 5.0 5.0 - 8.0   Glucose, UA NEGATIVE NEGATIVE mg/dL   Hgb urine dipstick NEGATIVE NEGATIVE   Bilirubin Urine NEGATIVE NEGATIVE   Ketones, ur NEGATIVE NEGATIVE mg/dL   Protein, ur NEGATIVE NEGATIVE mg/dL   Nitrite NEGATIVE NEGATIVE   Leukocytes,Ua NEGATIVE NEGATIVE  CBC     Status: Abnormal   Collection Time: 02/01/23  9:07 PM  Result Value Ref Range   WBC 13.8 (H) 4.0 - 10.5 K/uL   RBC 3.10 (L) 3.87 - 5.11 MIL/uL   Hemoglobin 6.7 (LL) 12.0 - 15.0 g/dL   HCT 78.2 (L) 95.6 - 21.3 %   MCV 74.2 (L) 80.0 - 100.0 fL   MCH 21.6 (L) 26.0 - 34.0 pg   MCHC 29.1 (L) 30.0 - 36.0 g/dL   RDW 08.6 (H) 57.8 - 46.9 %   Platelets 565 (H) 150 - 400 K/uL   nRBC 0.0 0.0 - 0.2 %  Prepare RBC (crossmatch)     Status: None   Collection Time: 02/02/23  3:01 AM  Result Value Ref Range   Order Confirmation      ORDER PROCESSED BY BLOOD BANK Performed at [facility], [address], [place], [place] [address]   Ferritin     Status: Abnormal   Collection Time: 02/02/23  9:41 AM   Result Value Ref Range   Ferritin 747 (H) 11 - 307 ng/mL  Iron and TIBC     Status: Abnormal   Collection Time: 02/02/23  9:41 AM  Result Value Ref Range   Iron 47 28 - 170 ug/dL   TIBC 841 (L) 324 - 401 ug/dL   Saturation Ratios 37 (H) 10.4 - 31.8 %   UIBC 80 ug/dL  Transferrin     Status: Abnormal   Collection Time: 02/02/23  9:41 AM  Result Value Ref Range   Transferrin 86 (L) 192 - 382 mg/dL  Folate     Status: None   Collection Time: 02/02/23  9:41 AM  Result Value Ref Range   Folate 9.1 >5.9 ng/mL  Protime-INR     Status: Abnormal   Collection Time: 02/02/23  9:41 AM  Result Value Ref Range   Prothrombin Time 18.8 (H) 11.4 - 15.2 seconds   INR 1.6 (H) 0.8 - 1.2  Lactate dehydrogenase     Status: None   Collection Time: 02/02/23  9:41 AM  Result Value Ref Range   LDH 150 98 - 192 U/L  Reticulocytes     Status: Abnormal   Collection Time: 02/02/23  9:41 AM  Result Value Ref Range   Retic Ct Pct 1.5 0.4 - 3.1 %   RBC. 3.96 3.87 - 5.11 MIL/uL  Retic Count, Absolute 57.8 19.0 - 186.0 K/uL   Immature Retic Fract 26.9 (H) 2.3 - 15.9 %  Basic metabolic panel     Status: Abnormal   Collection Time: 02/02/23  9:41 AM  Result Value Ref Range   Sodium 135 135 - 145 mmol/L   Potassium 3.4 (L) 3.5 - 5.1 mmol/L   Chloride 98 98 - 111 mmol/L   CO2 24 22 - 32 mmol/L   Glucose, Bld 145 (H) 70 - 99 mg/dL   BUN 9 8 - 23 mg/dL   Creatinine, Ser 3.08 0.44 - 1.00 mg/dL   Calcium 8.6 (L) 8.9 - 10.3 mg/dL   GFR, Estimated >65 >78 mL/min   Anion gap 13 5 - 15  Magnesium     Status: None   Collection Time: 02/02/23  9:41 AM  Result Value Ref Range   Magnesium 1.9 1.7 - 2.4 mg/dL  Phosphorus     Status: None   Collection Time: 02/02/23  9:41 AM  Result Value Ref Range   Phosphorus 3.7 2.5 - 4.6 mg/dL  Hemoglobin and hematocrit, blood     Status: Abnormal   Collection Time: 02/02/23  9:41 AM  Result Value Ref Range   Hemoglobin 9.2 (L) 12.0 - 15.0 g/dL   HCT 46.9 (L) 62.9 - 52.8  %    I have reviewed pertinent nursing notes, vitals, labs, and images as necessary. I have ordered labwork to follow up on as indicated.  I have reviewed the last notes from staff over past 24 hours. I have discussed patient's care plan and test results with nursing staff, CM/SW, and other staff as appropriate.  Time spent: Greater than 50% of the 55 minute visit was spent in counseling/coordination of care for the patient as laid out in the A&P.   LOS: 0 days   Lewie Chamber, MD [facility] 02/02/2023, 1:04 PM

## 2023-02-02 NOTE — Plan of Care (Signed)

## 2023-02-02 NOTE — Consult Note (Signed)
Reason for Consult: Symptomatic anemia and heme positive stool Referring Physician: [facility]  Docia Barrier HPI: This is an 82 year old female with a PMH of breast cancer s/p mastectomy, arthritis, and HTN admitted for symptomatic anemia.  She started to experience progressive fatigue and DOE over the past several weeks.  On 01/11/2023 Dr. Tanya Nones, her PCP, noted a significant drop in her HGB from 12.4 g/dL (5/36/6440) down to 8.4 g/dL MCV 74.  A follow up visit on 02/01/2023 with Dr. Tanya Nones showed that her HGB declined some more down to 7.0 g/dL.  A rectal examination showed that she was heme positive.  At that time she was advised to present to the ER for further work up and evaluation. The patient denies any issues hematochezia, melena, nausea, vomiting, hematemesis, hemoptysis, or hematuria.  There were no reports of any abdominal pains.  The patient does not recall having a colonoscopy in the past.  On a mild basis she does report having some GERD issues.  Past Medical History:  Diagnosis Date   Anemia    Anxiety    nervous sometimes, denies panica ttacks    Arthritis    knees ?   Blood transfusion    post vaginal birth- 1960    Breast cancer (HCC)    Right Breast Cancer   Cancer (HCC)    R breast cancer   Dry mouth    Hypertension    Neuromuscular disorder (HCC)    L leg, thigh- "burns sometimes"   Shortness of breath    sometimes    Use of letrozole (Femara) 05/2010   neoadjuvant femara therapy since 1/212    Past Surgical History:  Procedure Laterality Date   ABDOMINAL HYSTERECTOMY     BREAST LUMPECTOMY     BREAST SURGERY     Right   jp drains     s/p masectomy 06/11/11/ Dr. [name], 2 jp drains right chest wall   MASTECTOMY Right    malignant   MASTECTOMY W/ SENTINEL NODE BIOPSY  06/11/2011/Right BReast   Procedure: MASTECTOMY WITH SENTINEL LYMPH NODE BIOPSY;  Surgeon: Ernestene Mention, MD;  Location: [facility] OR;  Service: General;  Laterality: Right;  Right  total mastectomy and sentinel lymph node biopsy using lymphatic mapping and blue dye injection.   TUBAL LIGATION     [place] FINE NEEDLE ASPIRATION WO/ W SMEAR  05/22/10   Left Breast- cyst or abscess     Family History  Problem Relation Age of Onset   Breast cancer Sister    Anesthesia problems Neg Hx    Hypotension Neg Hx    Malignant hyperthermia Neg Hx    Pseudochol deficiency Neg Hx     Social History:  reports that she has never smoked. She has never used smokeless tobacco. She reports that she does not drink alcohol and does not use drugs.  Allergies:  Allergies  Allergen Reactions   Fosamax [Alendronate Sodium] Other (See Comments)    Aches and pains    Medications: Scheduled:  ferrous sulfate  325 mg Oral Daily   pantoprazole (PROTONIX) IV  40 mg Intravenous Q12H   sodium chloride flush  3 mL Intravenous Q12H   Continuous:  Results for orders placed or performed during the hospital encounter of 02/01/23 (from the past 24 hour(s))  Type and screen [facility]     Status: None (Preliminary result)   Collection Time: 02/01/23  3:00 PM  Result Value Ref Range   ABO/RH(D)  A POS    Antibody Screen NEG    Sample Expiration 02/04/2023,2359    Unit Number [id number]    Blood Component Type RED CELLS,LR    Unit division 00    Status of Unit ISSUED    Transfusion Status OK TO TRANSFUSE    Crossmatch Result      Compatible Performed at [facility], [address], [place], [place] [address]   ABO/Rh     Status: None   Collection Time: 02/01/23  3:12 PM  Result Value Ref Range   ABO/RH(D)      A POS Performed at [facility], [address], [place], [place] [address]   Comprehensive metabolic panel     Status: Abnormal   Collection Time: 02/01/23  3:15 PM  Result Value Ref Range   Sodium 136 135 - 145 mmol/L   Potassium 3.4 (L) 3.5 - 5.1 mmol/L   Chloride 99 98 - 111 mmol/L   CO2 25 22 - 32 mmol/L   Glucose, Bld 110 (H) 70 - 99  mg/dL   BUN 11 8 - 23 mg/dL   Creatinine, Ser 6.21 (H) 0.44 - 1.00 mg/dL   Calcium 8.3 (L) 8.9 - 10.3 mg/dL   Total Protein 7.1 6.5 - 8.1 g/dL   Albumin 1.9 (L) 3.5 - 5.0 g/dL   AST 19 15 - 41 U/L   ALT 20 0 - 44 U/L   Alkaline Phosphatase 80 38 - 126 U/L   Total Bilirubin 0.7 0.3 - 1.2 mg/dL   GFR, Estimated 53 (L) >60 mL/min   Anion gap 12 5 - 15  CBC     Status: Abnormal   Collection Time: 02/01/23  3:15 PM  Result Value Ref Range   WBC 14.4 (H) 4.0 - 10.5 K/uL   RBC 3.19 (L) 3.87 - 5.11 MIL/uL   Hemoglobin 7.0 (L) 12.0 - 15.0 g/dL   HCT 30.8 (L) 65.7 - 84.6 %   MCV 75.5 (L) 80.0 - 100.0 fL   MCH 21.9 (L) 26.0 - 34.0 pg   MCHC 29.0 (L) 30.0 - 36.0 g/dL   RDW 96.2 (H) 95.2 - 84.1 %   Platelets 599 (H) 150 - 400 K/uL   nRBC 0.0 0.0 - 0.2 %  Urinalysis, Routine w reflex microscopic -Urine, Clean Catch     Status: Abnormal   Collection Time: 02/01/23  8:29 PM  Result Value Ref Range   Color, Urine YELLOW YELLOW   APPearance CLEAR CLEAR   Specific Gravity, Urine >1.046 (H) 1.005 - 1.030   pH 5.0 5.0 - 8.0   Glucose, UA NEGATIVE NEGATIVE mg/dL   Hgb urine dipstick NEGATIVE NEGATIVE   Bilirubin Urine NEGATIVE NEGATIVE   Ketones, ur NEGATIVE NEGATIVE mg/dL   Protein, ur NEGATIVE NEGATIVE mg/dL   Nitrite NEGATIVE NEGATIVE   Leukocytes,Ua NEGATIVE NEGATIVE  CBC     Status: Abnormal   Collection Time: 02/01/23  9:07 PM  Result Value Ref Range   WBC 13.8 (H) 4.0 - 10.5 K/uL   RBC 3.10 (L) 3.87 - 5.11 MIL/uL   Hemoglobin 6.7 (LL) 12.0 - 15.0 g/dL   HCT 32.4 (L) 40.1 - 02.7 %   MCV 74.2 (L) 80.0 - 100.0 fL   MCH 21.6 (L) 26.0 - 34.0 pg   MCHC 29.1 (L) 30.0 - 36.0 g/dL   RDW 25.3 (H) 66.4 - 40.3 %   Platelets 565 (H) 150 - 400 K/uL   nRBC 0.0 0.0 - 0.2 %  Prepare RBC (crossmatch)     Status: None   Collection Time: 02/02/23  3:01 AM  Result Value Ref Range   Order Confirmation      ORDER PROCESSED BY BLOOD BANK Performed at [facility], [address],  [place], [place] [address]      CT CHEST W CONTRAST  Result Date: 02/01/2023 CLINICAL DATA:  Pulmonary nodules renal mass EXAM: CT CHEST WITH CONTRAST TECHNIQUE: Multidetector CT imaging of the chest was performed during intravenous contrast administration. RADIATION DOSE REDUCTION: This exam was performed according to the departmental dose-optimization program which includes automated exposure control, adjustment of the mA and/or kV according to patient size and/or use of iterative reconstruction technique. CONTRAST:  50mL OMNIPAQUE IOHEXOL 350 MG/ML SOLN COMPARISON:  CT 02/01/2023, CT 05/26/2010 FINDINGS: Cardiovascular: Moderate aortic atherosclerosis. Mild aneurysmal dilatation of ascending aorta up to 4.1 cm. Borderline cardiomegaly. No pericardial effusion Mediastinum/Nodes: Midline trachea. 1.5 cm hypodense nodule right lobe of thyroid with calcification but present on 2012 comparison. Stability for greater than 5 years implies benignity; no biopsy or followup indicated (ref: J Am Coll Radiol. 2015 Feb;12(2): 143-50). No suspicious lymph nodes. Esophagus within normal limits. Lungs/Pleura: Lungs demonstrate no acute airspace disease or pleural effusion. Numerous bilateral pulmonary nodules. The largest lesions will be measured. Right anterior upper lobe pulmonary nodule measuring 1.4 x 1.2 cm on series 4 image 45. Posterior right upper lobe pulmonary nodule measuring 14 x 10 mm on series 4, image 54. Left upper lobe posterior pulmonary nodule measuring 1.2 x 1.2 cm on series 4, image 76. Multiple left lower lobe pulmonary nodules including 10 x 9 mm nodule on series 4, image 89 and subpleural left lung base pulmonary nodule measuring 12 by 7 mm on series 4, image 105. Multiple additional bilateral pulmonary nodules are noted. Upper Abdomen: See separately dictated CT Musculoskeletal: No acute or suspicious osseous abnormality. IMPRESSION: 1. Numerous bilateral pulmonary nodules suspicious for metastatic  disease. 2. Aortic atherosclerosis. Aortic Atherosclerosis (ICD10-I70.0). Electronically Signed   By: Jasmine Pang M.D.   On: 02/01/2023 22:03   CT Angio Abd/Pel W and/or Wo Contrast  Result Date: 02/01/2023 CLINICAL DATA:  Lower GI bleed EXAM: CTA ABDOMEN AND PELVIS WITHOUT AND WITH CONTRAST TECHNIQUE: Multidetector CT imaging of the abdomen and pelvis was performed using the standard protocol during bolus administration of intravenous contrast. Multiplanar reconstructed images and MIPs were obtained and reviewed to evaluate the vascular anatomy. RADIATION DOSE REDUCTION: This exam was performed according to the departmental dose-optimization program which includes automated exposure control, adjustment of the mA and/or kV according to patient size and/or use of iterative reconstruction technique. CONTRAST:  75mL OMNIPAQUE IOHEXOL 350 MG/ML SOLN COMPARISON:  06/03/2010 FINDINGS: VASCULAR Aorta: Normal caliber aorta without aneurysm, dissection, vasculitis or significant stenosis. Scattered calcifications. Celiac: Patent without evidence of aneurysm, dissection, vasculitis or significant stenosis. SMA: Patent without evidence of aneurysm, dissection, vasculitis or significant stenosis. Renals: Both renal arteries are patent without evidence of aneurysm, dissection, vasculitis, fibromuscular dysplasia or significant stenosis. IMA: Patent without evidence of aneurysm, dissection, vasculitis or significant stenosis. Inflow: Patent without evidence of aneurysm, dissection, vasculitis or significant stenosis. Scattered calcifications. Proximal Outflow: Bilateral common femoral and visualized portions of the superficial and profunda femoral arteries are patent without evidence of aneurysm, dissection, vasculitis or significant stenosis. Veins: No obvious venous abnormality within the limitations of this arterial phase study. Review of the MIP images confirms the above findings. NON-VASCULAR Lower chest: 8 mm  well-circumscribed rounded nodule in  the left lower lobe, new since 20/12. Subpleural nodular area in the left lower lobe measures 11 mm on image 15. Left lower lobe nodule on image 19 measures 8 mm. Right middle lobe nodule on image 9 measures 4 mm. No effusions. Hepatobiliary: No focal hepatic abnormality. Multiple gallstones within the gallbladder. No biliary ductal dilatation. Pancreas: No focal abnormality or ductal dilatation. Spleen: No focal abnormality.  Normal size. Adrenals/Urinary Tract: Adrenal glands normal. Large mass off the mid to lower pole of the right kidney with areas of enhancement concerning for renal cell carcinoma. This is new since prior study. This measures 11.1 x 9.1 x 6.6 cm. No suspicious left renal abnormality. No hydronephrosis. Urinary bladder unremarkable. Stomach/Bowel: Left colonic diverticulosis. No active diverticulitis. No active contrast extravasation to localize GI bleed. Stomach and small bowel decompressed, unremarkable. Lymphatic: No adenopathy. Reproductive: Prior hysterectomy.  No adnexal masses. Other: No free fluid or free air. Musculoskeletal: No acute bony abnormality. IMPRESSION: VASCULAR No contrast extravasation to localize GI bleed. No evidence of aortic aneurysm or dissection. NON-VASCULAR Approximately 11 cm enhancing mass involving the mid to lower pole of the right kidney most compatible with renal cell carcinoma. Recommend urologic consultation. Cholelithiasis. Left colonic diverticulosis. Electronically Signed   By: Charlett Nose M.D.   On: 02/01/2023 19:12    ROS:  As stated above in the HPI otherwise negative.  Blood pressure 126/60, pulse 84, temperature 97.8 F (36.6 C), temperature source Oral, resp. rate 18, height 5\' 8"  (1.727 m), weight 88 kg, SpO2 97%.    PE: Gen: NAD, Alert and Oriented HEENT:  [place]/AT, EOMI Neck: Supple, no LAD Lungs: CTA Bilaterally CV: RRR without M/G/R ABD: Soft, NTND, +BS Ext: No C/C/E  Assessment/Plan: 1)  IDA. 2) Heme positive stool. 3) GERD. 4) DOE.   The patient is stable and she completed one unit of PRBC.  Further evaluation with an EGD/colonoscopy is necessary.  There was a mild history of GERD and in the elderly a severe esophagitis can be present even if the clinical symptoms are mild.  There was no reported family history of a colon cancer.  Plan: 1) EGD/colonoscopy tomorrow. 2) Follow HGB and transfuse as needed.  Candance Bohlman D 02/02/2023, 7:30 AM

## 2023-02-03 ENCOUNTER — Inpatient Hospital Stay (HOSPITAL_COMMUNITY): Payer: Medicare HMO | Admitting: Anesthesiology

## 2023-02-03 ENCOUNTER — Encounter (HOSPITAL_COMMUNITY)
Admission: EM | Disposition: A | Discharge status: Home / Self Care | Payer: Self-pay | Source: Home / Self Care | Attending: Internal Medicine

## 2023-02-03 ENCOUNTER — Encounter (HOSPITAL_COMMUNITY): Payer: Self-pay | Admitting: Internal Medicine

## 2023-02-03 DIAGNOSIS — K2970 Gastritis, unspecified, without bleeding: Secondary | ICD-10-CM | POA: Diagnosis not present

## 2023-02-03 DIAGNOSIS — D509 Iron deficiency anemia, unspecified: Secondary | ICD-10-CM | POA: Diagnosis not present

## 2023-02-03 DIAGNOSIS — D126 Benign neoplasm of colon, unspecified: Secondary | ICD-10-CM | POA: Diagnosis not present

## 2023-02-03 DIAGNOSIS — D649 Anemia, unspecified: Secondary | ICD-10-CM | POA: Diagnosis not present

## 2023-02-03 DIAGNOSIS — C641 Malignant neoplasm of right kidney, except renal pelvis: Secondary | ICD-10-CM | POA: Diagnosis not present

## 2023-02-03 DIAGNOSIS — I10 Essential (primary) hypertension: Secondary | ICD-10-CM

## 2023-02-03 HISTORY — PX: COLONOSCOPY WITH PROPOFOL: SHX5780

## 2023-02-03 HISTORY — PX: HEMOSTASIS CLIP PLACEMENT: SHX6857

## 2023-02-03 HISTORY — PX: BIOPSY: SHX5522

## 2023-02-03 HISTORY — PX: ESOPHAGOGASTRODUODENOSCOPY (EGD) WITH PROPOFOL: SHX5813

## 2023-02-03 HISTORY — PX: SUBMUCOSAL LIFTING INJECTION: SHX6855

## 2023-02-03 HISTORY — PX: POLYPECTOMY: SHX5525

## 2023-02-03 LAB — TYPE AND SCREEN
ABO/RH(D): A POS
Antibody Screen: NEGATIVE
Unit division: 0

## 2023-02-03 LAB — HEMOGLOBIN AND HEMATOCRIT, BLOOD
HCT: 28.3 % — ABNORMAL LOW (ref 36.0–46.0)
HCT: 29 % — ABNORMAL LOW (ref 36.0–46.0)
Hemoglobin: 8.8 g/dL — ABNORMAL LOW (ref 12.0–15.0)
Hemoglobin: 8.7 g/dL — ABNORMAL LOW (ref 12.0–15.0)

## 2023-02-03 LAB — BASIC METABOLIC PANEL
Anion gap: 17 — ABNORMAL HIGH (ref 5–15)
BUN: 7 mg/dL — ABNORMAL LOW (ref 8–23)
CO2: 21 mmol/L — ABNORMAL LOW (ref 22–32)
Calcium: 8.5 mg/dL — ABNORMAL LOW (ref 8.9–10.3)
Chloride: 100 mmol/L (ref 98–111)
Creatinine, Ser: 0.83 mg/dL (ref 0.44–1.00)
GFR, Estimated: >60 mL/min (ref >60)
Glucose, Bld: 106 mg/dL — ABNORMAL HIGH (ref 70–99)
Potassium: 3.7 mmol/L (ref 3.5–5.1)
Sodium: 138 mmol/L (ref 135–145)

## 2023-02-03 LAB — BPAM RBC
Blood Product Expiration Date: 202410202359
ISSUE DATE / TIME: 202409280319
Unit Type and Rh: 6200

## 2023-02-03 LAB — MAGNESIUM: Magnesium: 1.8 mg/dL (ref 1.7–2.4)

## 2023-02-03 SURGERY — ESOPHAGOGASTRODUODENOSCOPY (EGD) WITH PROPOFOL
Anesthesia: Monitor Anesthesia Care

## 2023-02-03 MED ORDER — FENTANYL CITRATE (PF) 100 MCG/2ML IJ SOLN: 25.0000 ug | INTRAMUSCULAR | Status: DC | PRN

## 2023-02-03 MED ORDER — PROPOFOL 500 MG/50ML IV EMUL
INTRAVENOUS | Status: DC | PRN
  Administered 2023-02-03: 100 ug/kg/min via INTRAVENOUS

## 2023-02-03 MED ORDER — MIDAZOLAM HCL 2 MG/2ML IJ SOLN: 0.5000 mg | Freq: Once | INTRAMUSCULAR | Status: DC | PRN

## 2023-02-03 MED ORDER — LACTATED RINGERS IV SOLN
INTRAVENOUS | Status: DC | PRN
Start: 2023-02-03 — End: 2023-02-03

## 2023-02-03 MED ORDER — ENSURE ENLIVE PO LIQD
237.0000 mL | Freq: Two times a day (BID) | ORAL | Status: DC
  Administered 2023-02-04 – 2023-02-06 (×4): 237 mL via ORAL

## 2023-02-03 MED ORDER — OXYCODONE HCL 5 MG PO TABS: 5.0000 mg | ORAL_TABLET | Freq: Once | ORAL | Status: DC | PRN

## 2023-02-03 MED ORDER — OXYCODONE HCL 5 MG/5ML PO SOLN: 5.0000 mg | Freq: Once | ORAL | Status: DC | PRN

## 2023-02-03 MED ORDER — MEPERIDINE HCL 25 MG/ML IJ SOLN: 6.2500 mg | INTRAMUSCULAR | Status: DC | PRN

## 2023-02-03 MED ORDER — PHENYLEPHRINE 80 MCG/ML (10ML) SYRINGE FOR IV PUSH (FOR BLOOD PRESSURE SUPPORT)
PREFILLED_SYRINGE | INTRAVENOUS | Status: DC | PRN
  Administered 2023-02-03: 80 ug via INTRAVENOUS

## 2023-02-03 MED ORDER — PROMETHAZINE HCL 25 MG/ML IJ SOLN: 6.2500 mg | INTRAMUSCULAR | Status: DC | PRN

## 2023-02-03 MED ORDER — LIDOCAINE 2% (20 MG/ML) 5 ML SYRINGE
INTRAMUSCULAR | Status: DC | PRN
  Administered 2023-02-03: 30 mg via INTRAVENOUS

## 2023-02-03 SURGICAL SUPPLY — 25 items

## 2023-02-03 NOTE — Progress Notes (Signed)
Progress Note    Diane Paul   ZOX:[id number]  DOB: [birth year]  DOA: 02/01/2023     1 PCP: Donita Brooks, MD  Initial CC: fatigue, low Hgb  Hospital Course: Diane Paul is an 82 yo female with PMH right breast cancer s/p mastectomy, HTN, arthritis, iron deficiency anemia who was referred to the hospital after worsening anemia on outpatient labs.  She had recently undergone evaluation with primary care on 02/01/2023 due to worsening fatigue.  Anemic workup was initiated. She had denied any abdominal pain or change in stools notably negative for melena. Hemoglobin was noted to be 7 g/dL and down trended to 6.7 g/dL.  She transfused 1 unit PRBC with improvement in hemoglobin. CT angio abdomen/pelvis was performed initially looking for possible bleeding etiology.  This was negative for evidence of GI bleed but showed underlying 11 cm right kidney mass concerning for RCC.  This was then followed by CT chest which showed numerous bilateral pulmonary nodules concerning for metastatic lesions. She was admitted for GI evaluation and urology evaluation.  Interval History:  Underwent EGD and colonoscopy today. No concerns when seen in her room after procedures.  Appetite returning and she was ready for resuming diet.  Assessment and Plan:  Microcytic anemia Iron deficiency anemia -Prior baseline hemoglobin noted to be around 12 g/dL in August 2023 but was noted to be 8.4 g/dL on 12/05/1912 -Has further down trended and noted to be as low as 6.7 g/dL after admission - Received 1 unit PRBC; hemoglobin improved to 9.2 g/dL posttransfusion -EGD revealed erosive gastropathy without stigmata of recent bleeding and underlying gastritis -Colonoscopy showed 2 polyps which were removed -Follow-up H. pylori testing; if positive quadruple therapy recommended per GI; if negative, capsule endoscopy recommended -Continue trending hemoglobin -Iron stores technically normal with slightly increased iron sat  ratio, again possibly in setting of underlying RCC - continue IV PPI - LDH normal; pending haptoglobin, but lower suspicion for hemolytic anemia currently   Right renal cell carcinoma Multiple pulmonary nodules CTA with incidental finding of right renal mass 11.1 x 9.1 x 6.6 cm concerning for RCC.  - patient and daughters aware of findings including lung nodules -Outpatient follow-up with urology planned for nephrectomy -Will also need referral to oncology due to underlying pulmonary metastases   Thrombocytosis -Suspect reactive in setting of IDA versus paraneoplastic   Leukocytosis -Suspect related to RCC   Weight loss  Review of weight trend shows 20 pound weight loss since 6/24.  Albumin is 1.9.  -Nutrition consulted   Hypokalemia -Repleted   Chronic medical problems: Breast cancer with history of mastectomy: Outpatient oncology follow-up Hypertension: No longer on antihypertensives   Old records reviewed in assessment of this patient  Antimicrobials:   DVT prophylaxis:  SCDs Start: 02/01/23 2022   Code Status:   Code Status: Full Code  Mobility Assessment (Last 72 Hours)     Mobility Assessment     Row Name 02/03/23 1115 02/02/23 2141 02/02/23 0754 02/01/23 2200     Does patient have an order for bedrest or is patient medically unstable No - Continue assessment No - Continue assessment No - Continue assessment No - Continue assessment    What is the highest level of mobility based on the progressive mobility assessment? Level 4 (Walks with assist in room) - Balance while marching in place and cannot step forward and back - Complete Level 4 (Walks with assist in room) - Balance while marching in place and cannot step  forward and back - Complete Level 4 (Walks with assist in room) - Balance while marching in place and cannot step forward and back - Complete Level 4 (Walks with assist in room) - Balance while marching in place and cannot step forward and back -  Complete             Barriers to discharge: none Disposition Plan:  Home Status is: Inpt  Objective: Blood pressure 119/70, pulse 92, temperature 98 F (36.7 C), resp. rate 14, height 5\' 8"  (1.727 m), weight 94.8 kg, SpO2 97%.  Examination:  Physical Exam Constitutional:      Appearance: Normal appearance.  HENT:     Head: Normocephalic and atraumatic.     Mouth/Throat:     Mouth: Mucous membranes are moist.  Eyes:     Extraocular Movements: Extraocular movements intact.  Cardiovascular:     Rate and Rhythm: Normal rate and regular rhythm.  Pulmonary:     Effort: Pulmonary effort is normal. No respiratory distress.     Breath sounds: Normal breath sounds. No wheezing.  Abdominal:     General: Bowel sounds are normal. There is no distension.     Palpations: Abdomen is soft.     Tenderness: There is no abdominal tenderness.  Musculoskeletal:        General: Normal range of motion.     Cervical back: Normal range of motion and neck supple.  Skin:    General: Skin is warm and dry.  Neurological:     General: No focal deficit present.     Mental Status: She is alert.  Psychiatric:        Mood and Affect: Mood normal.      Consultants:  Urology GI  Procedures:  02/03/2023: EGD and colonoscopy  Data Reviewed: Results for orders placed or performed during the hospital encounter of 02/01/23 (from the past 24 hour(s))  Hemoglobin and hematocrit, blood     Status: Abnormal   Collection Time: 02/02/23  6:02 PM  Result Value Ref Range   Hemoglobin 10.6 (L) 12.0 - 15.0 g/dL   HCT 78.2 (L) 95.6 - 21.3 %  Hemoglobin and hematocrit, blood     Status: Abnormal   Collection Time: 02/03/23  8:49 AM  Result Value Ref Range   Hemoglobin 8.8 (L) 12.0 - 15.0 g/dL   HCT 08.6 (L) 57.8 - 46.9 %  Magnesium     Status: None   Collection Time: 02/03/23  8:49 AM  Result Value Ref Range   Magnesium 1.8 1.7 - 2.4 mg/dL  Basic metabolic panel     Status: Abnormal   Collection  Time: 02/03/23  8:49 AM  Result Value Ref Range   Sodium 138 135 - 145 mmol/L   Potassium 3.7 3.5 - 5.1 mmol/L   Chloride 100 98 - 111 mmol/L   CO2 21 (L) 22 - 32 mmol/L   Glucose, Bld 106 (H) 70 - 99 mg/dL   BUN 7 (L) 8 - 23 mg/dL   Creatinine, Ser 6.29 0.44 - 1.00 mg/dL   Calcium 8.5 (L) 8.9 - 10.3 mg/dL   GFR, Estimated >52 >84 mL/min   Anion gap 17 (H) 5 - 15    I have reviewed pertinent nursing notes, vitals, labs, and images as necessary. I have ordered labwork to follow up on as indicated.  I have reviewed the last notes from staff over past 24 hours. I have discussed patient's care plan and test results with nursing staff, CM/SW, and  other staff as appropriate.  Time spent: Greater than 50% of the 55 minute visit was spent in counseling/coordination of care for the patient as laid out in the A&P.   LOS: 1 day   Lewie Chamber, MD [facility] 02/03/2023, 12:04 PM

## 2023-02-03 NOTE — Interval H&P Note (Signed)
History and Physical Interval Note:  02/03/2023 9:18 AM  Diane Paul  has presented today for surgery, with the diagnosis of IDA and heme positive stool.  The various methods of treatment have been discussed with the patient and family. After consideration of risks, benefits and other options for treatment, the patient has consented to  Procedure(s): ESOPHAGOGASTRODUODENOSCOPY (EGD) WITH PROPOFOL (N/A) COLONOSCOPY WITH PROPOFOL (N/A) as a surgical intervention.  The patient's history has been reviewed, patient examined, no change in status, stable for surgery.  I have reviewed the patient's chart and labs.  Questions were answered to the patient's satisfaction.     Diane Paul D

## 2023-02-03 NOTE — Transfer of Care (Signed)
Immediate Anesthesia Transfer of Care Note  Patient: Diane Paul  Procedure(s) Performed: ESOPHAGOGASTRODUODENOSCOPY (EGD) WITH PROPOFOL COLONOSCOPY WITH PROPOFOL BIOPSY POLYPECTOMY SUBMUCOSAL LIFTING INJECTION HEMOSTASIS CLIP PLACEMENT  Patient Location: PACU  Anesthesia Type:MAC  Level of Consciousness: drowsy  Airway & Oxygen Therapy: Patient Spontanous Breathing and Patient connected to nasal cannula oxygen  Post-op Assessment: Report given to RN and Post -op Vital signs reviewed and stable  Post vital signs: Reviewed and stable  Last Vitals:  Vitals Value Taken Time  BP 106/65 02/03/23 1053  Temp    Pulse 84 02/03/23 1055  Resp 31 02/03/23 1055  SpO2 98 % 02/03/23 1055  Vitals shown include unfiled device data.  Last Pain:  Vitals:   02/03/23 0913  TempSrc: Temporal  PainSc: 0-No pain         Complications: No notable events documented.

## 2023-02-03 NOTE — Anesthesia Preprocedure Evaluation (Addendum)
Anesthesia Evaluation  Patient identified by MRN, date of birth, ID band Patient awake    Reviewed: Allergy & Precautions, NPO status , Patient's Chart, lab work & pertinent test results  History of Anesthesia Complications Negative for: history of anesthetic complications  Airway Mallampati: I  TM Distance: >3 FB Neck ROM: Full    Dental  (+) Edentulous Upper, Edentulous Lower   Pulmonary shortness of breath CT chest which showed numerous bilateral pulmonary nodules concerning for metastatic lesions.   breath sounds clear to auscultation       Cardiovascular hypertension, Pt. on medications (-) angina  Rhythm:Regular Rate:Normal     Neuro/Psych   Anxiety     negative neurological ROS     GI/Hepatic Neg liver ROS,,,GI bleed?   Endo/Other  BMI 31.8  Renal/GU Renal mass     Musculoskeletal  (+) Arthritis ,    Abdominal   Peds  Hematology  (+) Blood dyscrasia (Hb 10.6, plt 565k), anemia   Anesthesia Other Findings H/o breast cancer  Reproductive/Obstetrics                              Anesthesia Physical Anesthesia Plan  ASA: 3  Anesthesia Plan: MAC   Post-op Pain Management: Minimal or no pain anticipated   Induction:   PONV Risk Score and Plan: 2 and Ondansetron and Treatment may vary due to age or medical condition  Airway Management Planned: Natural Airway and Nasal Cannula  Additional Equipment: None  Intra-op Plan:   Post-operative Plan:   Informed Consent: I have reviewed the patients History and Physical, chart, labs and discussed the procedure including the risks, benefits and alternatives for the proposed anesthesia with the patient or authorized representative who has indicated his/her understanding and acceptance.       Plan Discussed with: CRNA and Surgeon  Anesthesia Plan Comments:          Anesthesia Quick Evaluation

## 2023-02-03 NOTE — Anesthesia Postprocedure Evaluation (Signed)
Anesthesia Post Note  Patient: KEYONDRA LAGRAND  Procedure(s) Performed: ESOPHAGOGASTRODUODENOSCOPY (EGD) WITH PROPOFOL COLONOSCOPY WITH PROPOFOL BIOPSY POLYPECTOMY SUBMUCOSAL LIFTING INJECTION HEMOSTASIS CLIP PLACEMENT     Patient location during evaluation: PACU Anesthesia Type: MAC Level of consciousness: awake and alert, oriented and patient cooperative Pain management: pain level controlled Vital Signs Assessment: post-procedure vital signs reviewed and stable Respiratory status: spontaneous breathing, nonlabored ventilation and respiratory function stable Cardiovascular status: stable and blood pressure returned to baseline Postop Assessment: no apparent nausea or vomiting Anesthetic complications: no   No notable events documented.  Last Vitals:  Vitals:   02/03/23 1100 02/03/23 1115  BP: 105/69 119/70  Pulse: 86 92  Resp: (!) 29 14  Temp:  36.7 C  SpO2: 97% 97%    Last Pain:  Vitals:   02/03/23 1140  TempSrc:   PainSc: 0-No pain                 Spence Soberano,E. Oluwatimileyin Vivier

## 2023-02-03 NOTE — Progress Notes (Signed)
Initial Nutrition Assessment  DOCUMENTATION CODES:   Obesity unspecified  INTERVENTION:  Ensure BID   NUTRITION DIAGNOSIS:   Increased nutrient needs related to chronic illness as evidenced by estimated needs.    GOAL:   Patient will meet greater than or equal to 90% of their needs    MONITOR:   PO intake, Labs, Weight trends, Supplement acceptance  REASON FOR ASSESSMENT:   Consult Assessment of nutrition requirement/status  ASSESSMENT:  82 y.o. female with hx of breast cancer with history of mastectomy, hypertension, iron deficiency anemia, who was referred to the ED by her primary care doctor after finding of anemia on outpatient labs. Baseline 9/29;  has presented today for surgery, with the diagnosis of IDA and heme positive stool.  RDN Reached out to PT Via phone with no answer. She did have surgery this day and possible sleeping.  Review of EMR revealed.reported weight loss and loss of appetite.   Meds; Ferrous sulfate,   Weight hx; 02/03/23 94.8 kg  02/01/23 88 kg  01/11/23 90.9 kg  10/05/22 100.7 kg    NUTRITION - FOCUSED PHYSICAL EXAM:    Diet Order:   Diet Order             Diet regular Room service appropriate? Yes; Fluid consistency: Thin  Diet effective now                   EDUCATION NEEDS:   Not appropriate for education at this time  Skin:  Skin Assessment: Reviewed RN Assessment  Last BM:  9/29  Height:   Ht Readings from Last 1 Encounters:  02/03/23 5\' 8"  (1.727 m)    Weight:   Wt Readings from Last 1 Encounters:  02/03/23 94.8 kg    Ideal Body Weight:  64 kg  BMI:  Body mass index is 31.78 kg/m.  Estimated Nutritional Needs:   Kcal:  1825-2200 kcal  Protein:  83-102g  Fluid:  14ml/kcal    Ricardo Jericho, RDN, LDN

## 2023-02-03 NOTE — Op Note (Addendum)
[facility] Patient Name: Diane Paul Procedure Date : 02/03/2023 MRN: [id number] Attending MD: Jeani Hawking , MD, [id number] Date of Birth: [birth year] CSN: [id number] Age: 82 Admit Type: Inpatient Procedure:                Colonoscopy Indications:              Heme positive stool, Iron deficiency anemia Providers:                Jeani Hawking, MD, Eliberto Ivory, RN, Salley Scarlet,                            Technician, Gwenyth Allegra CRNA, CRNA Referring MD:              Medicines:                Propofol per Anesthesia Complications:            No immediate complications. Estimated Blood Loss:     Estimated blood loss: none. Procedure:                Pre-Anesthesia Assessment:                           - Prior to the procedure, a History and Physical                            was performed, and patient medications and                            allergies were reviewed. The patient's tolerance of                            previous anesthesia was also reviewed. The risks                            and benefits of the procedure and the sedation                            options and risks were discussed with the patient.                            All questions were answered, and informed consent                            was obtained. Prior Anticoagulants: The patient has                            taken no anticoagulant or antiplatelet agents. ASA                            Grade Assessment: III - A patient with severe                            systemic disease. After reviewing the risks and  benefits, the patient was deemed in satisfactory                            condition to undergo the procedure.                           - Sedation was administered by an anesthesia                            professional. Deep sedation was attained.                           After obtaining informed consent, the colonoscope                             was passed under direct vision. Throughout the                            procedure, the patient's blood pressure, pulse, and                            oxygen saturations were monitored continuously. The                            PCF-HQ190L ([id number]) Olympus colonoscope was                            introduced through the anus and advanced to the the                            cecum, identified by appendiceal orifice and                            ileocecal valve. The colonoscopy was technically                            difficult and complex. The patient tolerated the                            procedure well. The quality of the bowel                            preparation was evaluated using the BBPS [facility]                            Bowel Preparation Scale) with scores of: Right                            Colon = 3 (entire mucosa seen well with no residual                            staining, small fragments of stool or opaque  liquid), Transverse Colon = 3 (entire mucosa seen                            well with no residual staining, small fragments of                            stool or opaque liquid) and Left Colon = 2 (minor                            amount of residual staining, small fragments of                            stool and/or opaque liquid, but mucosa seen well).                            The total BBPS score equals 8. The quality of the                            bowel preparation was good. The ileocecal valve,                            appendiceal orifice, and rectum were photographed. Scope In: 9:40:48 AM Scope Out: 10:43:36 AM Scope Withdrawal Time: 0 hours 54 minutes 35 seconds  Total Procedure Duration: 1 hour 2 minutes 48 seconds  Findings:      Two sessile polyps were found in the ascending colon. The polyps were 20       to 25 mm in size. These polyps were removed with a saline injection-lift       technique using a hot snare.  Resection and retrieval were complete. Area       was successfully injected with 16 mL EverLift for a lift polypectomy.       Estimated blood loss was minimal. To prevent bleeding post-intervention,       eight hemostatic clips were successfully placed (MR safe). Clip       manufacturer: [employer]. There was no bleeding at the end of the       procedure.      Scattered large-mouthed, medium-mouthed and small-mouthed diverticula       were found in the sigmoid colon and descending colon.      Two large polyps were found in the ascending colon. The smaller 2 cm       ascending colon polyp was on the border of the cecal cap. A water       immersion technique was used and the polyp was removed in two passes.       The polypectomy site was secured with three hemoclips. The large 2.5 cm       ascending colon polyp was sessile and there was the possibility of a       central depression. A total of 16 mL of EverLift was injected and it       successfully lifted the entire lesion, including the central depression.       This polyp was then removed in a piecemeal fashion. All suspicious edges       and mucosal islands were ablated with the tip of the hot snare. The  mucosal defect was closed using 5 Mantis hemoclips. Two other smaller       sessile polyps were removed with a cold snare. It is possible she has a       couple of other smaller polyps in the region, but the focus of this       procedure was to remove the significant polyps. Impression:               - Two 20 to 25 mm polyps in the ascending colon,                            removed using injection-lift and a hot snare.                            Resected and retrieved. Injected. Clip                            manufacturer: [employer]. Clips (MR safe)                            were placed.                           - Diverticulosis in the sigmoid colon and in the                            descending  colon. Recommendation:           - Return patient to hospital ward for ongoing care.                           - Resume regular diet.                           - Continue present medications.                           - Await pathology results.                           - Follow HGB. If anemia persists and she is H.                            pylori negative, then an VCE is appropriate.                           - Consider a repeat colonoscopy in 6 months to                            assess the polypectomy sites.                           - [facility] will assume care in the AM. Procedure Code(s):        --- Professional ---  16109, Colonoscopy, flexible; with removal of                            tumor(s), polyp(s), or other lesion(s) by snare                            technique                           45381, Colonoscopy, flexible; with directed                            submucosal injection(s), any substance Diagnosis Code(s):        --- Professional ---                           D12.2, Benign neoplasm of ascending colon                           R19.5, Other fecal abnormalities                           D50.9, Iron deficiency anemia, unspecified                           K57.30, Diverticulosis of large intestine without                            perforation or abscess without bleeding CPT copyright 2022 American Medical Association. All rights reserved. The codes documented in this report are preliminary and upon coder review may  be revised to meet current compliance requirements. Jeani Hawking, MD Jeani Hawking, MD 02/03/2023 11:24:30 AM This report has been signed electronically. Number of Addenda: 0

## 2023-02-03 NOTE — Op Note (Signed)
[facility] Patient Name: Diane Paul Procedure Date : 02/03/2023 MRN: [id number] Attending MD: Jeani Hawking , MD, [id number] Date of Birth: Jun 28, 1940 CSN: [id number] Age: 82 Admit Type: Inpatient Procedure:                Upper GI endoscopy Indications:              Iron deficiency anemia, Heme positive stool Providers:                Jeani Hawking, MD, Eliberto Ivory, RN, Salley Scarlet,                            Technician, Gwenyth Allegra CRNA, CRNA Referring MD:              Medicines:                Propofol per Anesthesia Complications:            No immediate complications. Estimated Blood Loss:     Estimated blood loss: none. Procedure:                Pre-Anesthesia Assessment:                           - Prior to the procedure, a History and Physical                            was performed, and patient medications and                            allergies were reviewed. The patient's tolerance of                            previous anesthesia was also reviewed. The risks                            and benefits of the procedure and the sedation                            options and risks were discussed with the patient.                            All questions were answered, and informed consent                            was obtained. Prior Anticoagulants: The patient has                            taken no anticoagulant or antiplatelet agents. ASA                            Grade Assessment: III - A patient with severe                            systemic disease. After reviewing the risks and  benefits, the patient was deemed in satisfactory                            condition to undergo the procedure.                           - Sedation was administered by an anesthesia                            professional. Deep sedation was attained.                           After obtaining informed consent, the endoscope was                             passed under direct vision. Throughout the                            procedure, the patient's blood pressure, pulse, and                            oxygen saturations were monitored continuously. The                            PCF-HQ190L ([id number] Olympus colonoscope was                            introduced through the mouth, and advanced to the                            second part of duodenum. The upper GI endoscopy was                            accomplished without difficulty. The patient                            tolerated the procedure well. Scope In: Scope Out: Findings:      A 3 cm hiatal hernia was present.      Multiple dispersed small erosions with no stigmata of recent bleeding       were found in the gastric body and in the gastric antrum. Biopsies were       taken with a cold forceps for Helicobacter pylori testing.      Diffuse moderate inflammation characterized by erythema and granularity       was found in the gastric body and in the gastric antrum.      The examined duodenum was normal.      In the distal gastric body and the antrum there was evidence of erosions       and possibly healing ulcer(s). There was no evidence of any active       bleeding. Impression:               - 3 cm hiatal hernia.                           - Erosive  gastropathy with no stigmata of recent                            bleeding. Biopsied.                           - Gastritis.                           - Normal examined duodenum. Recommendation:           - Proceed with the colonoscopy.                           - Follow up on the biopsies for H. pylori. If it is                            positive treat with quadruple therapy. Procedure Code(s):        --- Professional ---                           [phone], Esophagogastroduodenoscopy, flexible,                            transoral; with biopsy, single or multiple Diagnosis Code(s):        --- Professional ---                            K31.89, Other diseases of stomach and duodenum                           K44.9, Diaphragmatic hernia without obstruction or                            gangrene                           K29.70, Gastritis, unspecified, without bleeding                           D50.9, Iron deficiency anemia, unspecified                           R19.5, Other fecal abnormalities CPT copyright 2022 [employer] Association. All rights reserved. The codes documented in this report are preliminary and upon coder review may  be revised to meet current compliance requirements. Jeani Hawking, MD Jeani Hawking, MD 02/03/2023 11:02:02 AM This report has been signed electronically. Number of Addenda: 0

## 2023-02-03 NOTE — Plan of Care (Signed)

## 2023-02-04 ENCOUNTER — Ambulatory Visit: Payer: Medicare HMO | Admitting: Family Medicine

## 2023-02-04 ENCOUNTER — Inpatient Hospital Stay (HOSPITAL_COMMUNITY): Payer: Medicare HMO

## 2023-02-04 DIAGNOSIS — D649 Anemia, unspecified: Secondary | ICD-10-CM | POA: Diagnosis not present

## 2023-02-04 DIAGNOSIS — D75839 Thrombocytosis, unspecified: Secondary | ICD-10-CM

## 2023-02-04 DIAGNOSIS — C641 Malignant neoplasm of right kidney, except renal pelvis: Secondary | ICD-10-CM | POA: Diagnosis not present

## 2023-02-04 DIAGNOSIS — D509 Iron deficiency anemia, unspecified: Secondary | ICD-10-CM | POA: Diagnosis not present

## 2023-02-04 DIAGNOSIS — C7801 Secondary malignant neoplasm of right lung: Secondary | ICD-10-CM

## 2023-02-04 DIAGNOSIS — C7802 Secondary malignant neoplasm of left lung: Secondary | ICD-10-CM

## 2023-02-04 LAB — HEMOGLOBIN AND HEMATOCRIT, BLOOD
HCT: 27.5 % — ABNORMAL LOW (ref 36.0–46.0)
HCT: 27.1 % — ABNORMAL LOW (ref 36.0–46.0)
Hemoglobin: 8.2 g/dL — ABNORMAL LOW (ref 12.0–15.0)
Hemoglobin: 8.2 g/dL — ABNORMAL LOW (ref 12.0–15.0)

## 2023-02-04 LAB — HAPTOGLOBIN: Haptoglobin: 780 mg/dL — ABNORMAL HIGH (ref 41–333)

## 2023-02-04 MED ORDER — GADOBUTROL 1 MMOL/ML IV SOLN
8.5000 mL | Freq: Once | INTRAVENOUS | Status: AC | PRN
  Administered 2023-02-04: 8.5 mL via INTRAVENOUS

## 2023-02-04 MED ORDER — DOCUSATE SODIUM 50 MG PO CAPS
50.0000 mg | ORAL_CAPSULE | Freq: Once | ORAL | Status: AC | PRN
  Administered 2023-02-04: 50 mg via ORAL
  Filled 2023-02-04: qty 1

## 2023-02-04 MED ORDER — PANTOPRAZOLE SODIUM 40 MG PO TBEC
40.0000 mg | DELAYED_RELEASE_TABLET | Freq: Two times a day (BID) | ORAL | Status: DC
  Administered 2023-02-04 – 2023-02-06 (×4): 40 mg via ORAL
  Filled 2023-02-04 (×4): qty 1

## 2023-02-04 NOTE — Plan of Care (Signed)

## 2023-02-04 NOTE — Plan of Care (Signed)
82 y/o F admitted with rectal bleeding 02/01/23 found to have right renal mass as well as multiple pulmonary nodules. Per urology consult note on 9/27 plan for outpatient urology follow up and likely nephrectomy in the future.   Request to IR for possible lung nodule biopsy - patient history and imaging reviewed by IR attending, Dr. Milford Cage, who recommends bronchoscopy with biopsy first and if sample is insufficient can proceed with lung biopsy in IR. Prefer lung biopsy to be done as an outpatient if it is needed.   No plans for IR procedure at this time. Order will be cancelled.  IR remains available as needed, please call with questions or concerns.  Lynnette Caffey, PA-C

## 2023-02-04 NOTE — Progress Notes (Signed)
Progress Note    Diane Paul   ZOX:[id number]  DOB: 12-10-1940  DOA: 02/01/2023     2 PCP: Donita Brooks, MD  Initial CC: fatigue, low Hgb  Hospital Course: Diane Paul is an 82 yo female with PMH right breast cancer s/p mastectomy, HTN, arthritis, iron deficiency anemia who was referred to the hospital after worsening anemia on outpatient labs.  She had recently undergone evaluation with primary care on 02/01/2023 due to worsening fatigue.  Anemic workup was initiated. She had denied any abdominal pain or change in stools notably negative for melena. Hemoglobin was noted to be 7 g/dL and down trended to 6.7 g/dL.  She transfused 1 unit PRBC with improvement in hemoglobin. CT angio abdomen/pelvis was performed initially looking for possible bleeding etiology.  This was negative for evidence of GI bleed but showed underlying 11 cm right kidney mass concerning for RCC.  This was then followed by CT chest which showed numerous bilateral pulmonary nodules concerning for metastatic lesions. She was admitted for GI evaluation and urology evaluation. She underwent EGD and colonoscopy on 02/03/2023. She will follow-up outpatient with urology to discuss nephrectomy.  Interval History:  No bowel movements since colonoscopy yesterday.  Feeling okay and actually stating that her energy has been improving.   Assessment and Plan:  Microcytic anemia Iron deficiency anemia -Prior baseline hemoglobin noted to be around 12 g/dL in August 2023 but was noted to be 8.4 g/dL on 12/05/1912 -Hemolytic anemia ruled out -Has further down trended and noted to be as low as 6.7 g/dL after admission - Received 1 unit PRBC; hemoglobin improved to 9.2 g/dL posttransfusion -EGD revealed erosive gastropathy without stigmata of recent bleeding and underlying gastritis -Colonoscopy showed 2 polyps which were removed -Follow-up H. pylori testing; if positive quadruple therapy recommended per GI; if negative, capsule  endoscopy recommended -Continue trending hemoglobin -Iron stores technically normal with slightly increased iron sat ratio, again possibly in setting of underlying RCC - continue PPI BID   Right renal cell carcinoma Multiple pulmonary nodules CTA with incidental finding of right renal mass 11.1 x 9.1 x 6.6 cm concerning for RCC.  - patient and daughters aware of findings including lung nodules -Outpatient follow-up with urology planned for nephrectomy -Oncology aware, she will follow-up with Dr. Cherly Hensen -Pulmonology consulted for bronchoscopy for biopsy after discussion with oncology and IR   Thrombocytosis -Suspect reactive in setting of IDA versus paraneoplastic   Leukocytosis -Suspect related to RCC   Weight loss  Review of weight trend shows 20 pound weight loss since 6/24.  Albumin is 1.9.  -Nutrition consulted   Hypokalemia -Repleted   Chronic medical problems: Breast cancer with history of mastectomy: Outpatient oncology follow-up Hypertension: No longer on antihypertensives   Old records reviewed in assessment of this patient  Antimicrobials:   DVT prophylaxis:  SCDs Start: 02/01/23 2022   Code Status:   Code Status: Full Code  Mobility Assessment (Last 72 Hours)     Mobility Assessment     Row Name 02/04/23 1026 02/04/23 0751 02/03/23 2125 02/03/23 1115 02/02/23 2141   Does patient have an order for bedrest or is patient medically unstable No - Continue assessment No - Continue assessment No - Continue assessment No - Continue assessment No - Continue assessment   What is the highest level of mobility based on the progressive mobility assessment? Level 5 (Walks with assist in room/hall) - Balance while stepping forward/back and can walk in room with assist - Complete Level  5 (Walks with assist in room/hall) - Balance while stepping forward/back and can walk in room with assist - Complete Level 4 (Walks with assist in room) - Balance while marching in place and  cannot step forward and back - Complete Level 4 (Walks with assist in room) - Balance while marching in place and cannot step forward and back - Complete Level 4 (Walks with assist in room) - Balance while marching in place and cannot step forward and back - Complete    Row Name 02/02/23 0754 02/01/23 2200         Does patient have an order for bedrest or is patient medically unstable No - Continue assessment No - Continue assessment      What is the highest level of mobility based on the progressive mobility assessment? Level 4 (Walks with assist in room) - Balance while marching in place and cannot step forward and back - Complete Level 4 (Walks with assist in room) - Balance while marching in place and cannot step forward and back - Complete               Barriers to discharge: none Disposition Plan:  Home Status is: Inpt  Objective: Blood pressure 115/60, pulse 91, temperature 98.4 F (36.9 C), temperature source Oral, resp. rate 18, height 5\' 8"  (1.727 m), weight 89.7 kg, SpO2 97%.  Examination:  Physical Exam Constitutional:      Appearance: Normal appearance.  HENT:     Head: Normocephalic and atraumatic.     Mouth/Throat:     Mouth: Mucous membranes are moist.  Eyes:     Extraocular Movements: Extraocular movements intact.  Cardiovascular:     Rate and Rhythm: Normal rate and regular rhythm.  Pulmonary:     Effort: Pulmonary effort is normal. No respiratory distress.     Breath sounds: Normal breath sounds. No wheezing.  Abdominal:     General: Bowel sounds are normal. There is no distension.     Palpations: Abdomen is soft.     Tenderness: There is no abdominal tenderness.  Musculoskeletal:        General: Normal range of motion.     Cervical back: Normal range of motion and neck supple.  Skin:    General: Skin is warm and dry.  Neurological:     General: No focal deficit present.     Mental Status: She is alert.  Psychiatric:        Mood and Affect: Mood  normal.      Consultants:  Urology GI Pulmonology   Procedures:  02/03/2023: EGD and colonoscopy  Data Reviewed: Results for orders placed or performed during the hospital encounter of 02/01/23 (from the past 24 hour(s))  Hemoglobin and hematocrit, blood     Status: Abnormal   Collection Time: 02/03/23  7:10 PM  Result Value Ref Range   Hemoglobin 8.7 (L) 12.0 - 15.0 g/dL   HCT 16.1 (L) 09.6 - 04.5 %  Hemoglobin and hematocrit, blood     Status: Abnormal   Collection Time: 02/04/23  8:02 AM  Result Value Ref Range   Hemoglobin 8.2 (L) 12.0 - 15.0 g/dL   HCT 40.9 (L) 81.1 - 91.4 %    I have reviewed pertinent nursing notes, vitals, labs, and images as necessary. I have ordered labwork to follow up on as indicated.  I have reviewed the last notes from staff over past 24 hours. I have discussed patient's care plan and test results with nursing staff,  CM/SW, and other staff as appropriate.    LOS: 2 days   Lewie Chamber, MD [facility] 02/04/2023, 1:15 PM

## 2023-02-04 NOTE — Consult Note (Signed)
NAME:  Diane Paul, MRN:  [id number], DOB:  1940-05-11, LOS: 2 ADMISSION DATE:  02/01/2023, CONSULTATION DATE:  02/03/3033 REFERRING MD:  Frederick Peers, CHIEF COMPLAINT:  Need bronch with biopsies to diagnose suspected pulmonary mets of renal cell carcinoma   History of Present Illness:   82 year old female with PMH right breast cancer s/p mastectomy, HTN, arthritis, iron deficiency anemia who was referred to the hospital after worsening anemia on outpatient labs. She had recently undergone evaluation with primary care on 02/01/2023 due to worsening fatigue. HGB dropped as low as 6.7, and she was transfused with 1 unit PRBC.  CT angio abdomen/pelvis was performed initially looking for possible bleeding etiology. This was negative for evidence of GI bleed but showed underlying 11 cm right kidney mass concerning for Renal cell carcinoma. This was then followed by CT chest which showed numerous bilateral pulmonary nodules concerning for metastatic lesions.  PCCM have been consulted for possible bronchoscopy with biopsies for definitive diagnosis of pulmonary nodules. ( Metastatic renal cell vs primary lung) Pertinent  Medical History   Past Medical History:  Diagnosis Date   Anemia    Anxiety    nervous sometimes, denies panica ttacks    Arthritis    knees ?   Blood transfusion    post vaginal birth- 1960    Breast cancer (HCC)    Right Breast Cancer   Cancer (HCC)    R breast cancer   Dry mouth    Hypertension    Neuromuscular disorder (HCC)    L leg, thigh- "burns sometimes"   Shortness of breath    sometimes    Use of letrozole (Femara) 05/2010   neoadjuvant femara therapy since 1/212     Significant Hospital Events: Including procedures, antibiotic start and stop dates in addition to other pertinent events   02/01/2023 admission to [facility]  02/04/23 PCCM Pulmonary Consult for bronch with biopsies of pulmonary nodules  CT Chest 02/01/2023 Numerous bilateral pulmonary nodules. The largest  lesions will be measured. Right anterior upper lobe pulmonary nodule measuring 1.4 x 1.2 cm on series 4 image 45. Posterior right upper lobe pulmonary nodule measuring 14 x 10 mm on series 4, image 54. Left upper lobe posterior pulmonary nodule measuring 1.2 x 1.2 cm on series 4, image 76. Multiple left lower lobe pulmonary nodules including 10 x 9 mm nodule on series 4, image 89 and subpleural left lung base pulmonary nodule measuring 12 by 7 mm on series 4, image 105. Multiple additional bilateral pulmonary nodules are noted. Numerous bilateral pulmonary nodules suspicious for metastatic disease.  CT Abdomen/ Pelvis 02/01/2023 11 cm enhancing mass involving the mid to lower pole of the right kidney most compatible with renal cell carcinoma. Interim History / Subjective:  States she feels better since admission and receiving blood. No history of hemoptysis. Short of breath when anemic, but has resolved since transfusion. She has had weight loss, but recently.   Objective   Blood pressure 115/60, pulse 91, temperature 98.4 F (36.9 C), temperature source Oral, resp. rate 18, height 5\' 8"  (1.727 m), weight 89.7 kg, SpO2 97%.        Intake/Output Summary (Last 24 hours) at 02/04/2023 1441 Last data filed at 02/04/2023 0902 Gross per 24 hour  Intake 180 ml  Output --  Net 180 ml   Filed Weights   02/03/23 0503 02/03/23 0913 02/04/23 0240  Weight: 94.8 kg 94.8 kg 89.7 kg    Examination: General:  Awake and alert,  in NAD HENT:  [place], AT, thick neck, MM dry, No LAD Lungs:  Bilateral chest excursion, clear, diminished per bases Cardiovascular:  S1, S2, RRR, No RMG Abdomen:  [place]of[place], NT, ND, BS +, Body mass index is 30.07 kg/m.  Extremities:  No obvious deformities, warm and dry, brisk capillary refill Neuro:  Awake and alert, follows commands, MAE x 4, appropriate GU:  Bed side commode  Resolved Hospital Problem list     Assessment & Plan:  Multiple pulmonary nodules suspicious  for metastatic disease  Large right renal mass. History of right sided breast cancer  Never smoker  Plan Biopsies can be done with inpatient vs outpatient path. If patient remains IP, can see when there is availability of bronch with biopsies on either Dr. Delton Coombes or Icard's schedules with endoscopy availability.  If unable to schedule as an inpatient , we can get a PET scan as an OP, and plan biopsies based on best site for tissue sampling. She is not on blood thinners.  As she is a never smoker, tissue should be sent for genetic and molecular markers.   Will schedule based on Endoscopy suite availability and Provider ability. May be as an OP.    Best Practice (right click and "Reselect all SmartList Selections" daily)   Per Primary Team Patient and daughter updated at bedside 02/04/2023.  Labs   CBC: Recent Labs  Lab 02/01/23 1515 02/01/23 2107 02/02/23 0941 02/02/23 1802 02/03/23 0849 02/03/23 1910 02/04/23 0802  WBC 14.4* 13.8*  --   --   --   --   --   HGB 7.0* 6.7* 9.2* 10.6* 8.8* 8.7* 8.2*  HCT 24.1* 23.0* 30.2* 35.9* 28.3* 29.0* 27.5*  MCV 75.5* 74.2*  --   --   --   --   --   PLT 599* 565*  --   --   --   --   --     Basic Metabolic Panel: Recent Labs  Lab 02/01/23 1109 02/01/23 1515 02/02/23 0941 02/03/23 0849  NA 136 136 135 138  K 4.0 3.4* 3.4* 3.7  CL 98 99 98 100  CO2 25 25 24  21*  GLUCOSE 115* 110* 145* 106*  BUN 12 11 9  7*  CREATININE 0.82 1.05* 0.84 0.83  CALCIUM 8.3* 8.3* 8.6* 8.5*  MG  --   --  1.9 1.8  PHOS  --   --  3.7  --    GFR: Estimated Creatinine Clearance: 61.2 mL/min (by C-G formula based on SCr of 0.83 mg/dL). Recent Labs  Lab 02/01/23 1515 02/01/23 2107  WBC 14.4* 13.8*    Liver Function Tests: Recent Labs  Lab 02/01/23 1109 02/01/23 1515  AST 15 19  ALT 15 20  ALKPHOS  --  80  BILITOT 0.4 0.7  PROT 6.5 7.1  ALBUMIN  --  1.9*   No results for input(s): "LIPASE", "AMYLASE" in the last 168 hours. No results for  input(s): "AMMONIA" in the last 168 hours.  ABG No results found for: "PHART", "PCO2ART", "PO2ART", "HCO3", "TCO2", "ACIDBASEDEF", "O2SAT"   Coagulation Profile: Recent Labs  Lab 02/02/23 0941  INR 1.6*    Cardiac Enzymes: No results for input(s): "CKTOTAL", "CKMB", "CKMBINDEX", "TROPONINI" in the last 168 hours.  HbA1C: No results found for: "HGBA1C"  CBG: No results for input(s): "GLUCAP" in the last 168 hours.  Review of Systems:   Weak, and tired.  Initial dyspnea that resolved with blood transfusion.   Past Medical History:  She,  has a past medical history of Anemia, Anxiety, Arthritis, Blood transfusion, Breast cancer (HCC), Cancer (HCC), Dry mouth, Hypertension, Neuromuscular disorder (HCC), Shortness of breath, and Use of letrozole (Femara) (05/2010).   Surgical History:   Past Surgical History:  Procedure Laterality Date   ABDOMINAL HYSTERECTOMY     BREAST LUMPECTOMY     BREAST SURGERY     Right   jp drains     s/p masectomy 06/11/11/ Dr. [name], 2 jp drains right chest wall   MASTECTOMY Right    malignant   MASTECTOMY W/ SENTINEL NODE BIOPSY  06/11/2011/Right BReast   Procedure: MASTECTOMY WITH SENTINEL LYMPH NODE BIOPSY;  Surgeon: Ernestene Mention, MD;  Location: [facility] OR;  Service: General;  Laterality: Right;  Right total mastectomy and sentinel lymph node biopsy using lymphatic mapping and blue dye injection.   TUBAL LIGATION     [place] FINE NEEDLE ASPIRATION WO/ W SMEAR  05/22/10   Left Breast- cyst or abscess      Social History:   reports that she has never smoked. She has never used smokeless tobacco. She reports that she does not drink alcohol and does not use drugs.   Family History:  Her family history includes Breast cancer in her sister. There is no history of Anesthesia problems, Hypotension, Malignant hyperthermia, or Pseudochol deficiency.   Allergies Allergies  Allergen Reactions   Fosamax [Alendronate Sodium] Other (See Comments)     Aches and pains     Home Medications  Prior to Admission medications   Medication Sig Start Date End Date Taking? Authorizing Provider  amLODipine (NORVASC) 5 MG tablet TAKE 2 TABLETS BY MOUTH EVERY DAY EQUAL 10MG  PER DAY 07/19/22  Yes Donita Brooks, MD  B Complex-C-Folic Acid (MULTIVITAMIN, STRESS FORMULA) tablet Take 1 tablet by mouth daily. gummie bear MVT   Yes [provider]  Calcium Carbonate-Vit D-Min (CALTRATE PLUS PO) Take by mouth. 1200MG  OF CALCIUM & 800IU OF VITAMIN D   Yes [provider]  furosemide (LASIX) 40 MG tablet Take 1 tablet (40 mg total) by mouth daily as needed. 09/27/20  Yes Donita Brooks, MD  Iron, Ferrous Sulfate, 325 (65 Fe) MG TABS Take 325 mg by mouth daily. 01/14/23  Yes Donita Brooks, MD  ezetimibe (ZETIA) 10 MG tablet Take 1 tablet (10 mg total) by mouth daily. Patient not taking: Reported on 02/02/2023 12/19/21   Donita Brooks, MD  levocetirizine Elita Boone ALLERGY 24HR) 5 MG tablet Take 1 tablet (5 mg total) by mouth every evening. Patient not taking: Reported on 02/02/2023 12/15/21   Donita Brooks, MD     Critical care time: 74 minutes    Bevelyn Ngo, MSN, AGACNP-BC [facility] Pulmonary/Critical Care Medicine See Amion for personal pager PCCM on call pager [phone]

## 2023-02-04 NOTE — H&P (View-Only) (Signed)
NAME:  Diane Paul, MRN:  [id number], DOB:  1940-05-11, LOS: 2 ADMISSION DATE:  02/01/2023, CONSULTATION DATE:  02/03/3033 REFERRING MD:  Frederick Peers, CHIEF COMPLAINT:  Need bronch with biopsies to diagnose suspected pulmonary mets of renal cell carcinoma   History of Present Illness:   82 year old female with PMH right breast cancer s/p mastectomy, HTN, arthritis, iron deficiency anemia who was referred to the hospital after worsening anemia on outpatient labs. She had recently undergone evaluation with primary care on 02/01/2023 due to worsening fatigue. HGB dropped as low as 6.7, and she was transfused with 1 unit PRBC.  CT angio abdomen/pelvis was performed initially looking for possible bleeding etiology. This was negative for evidence of GI bleed but showed underlying 11 cm right kidney mass concerning for Renal cell carcinoma. This was then followed by CT chest which showed numerous bilateral pulmonary nodules concerning for metastatic lesions.  PCCM have been consulted for possible bronchoscopy with biopsies for definitive diagnosis of pulmonary nodules. ( Metastatic renal cell vs primary lung) Pertinent  Medical History   Past Medical History:  Diagnosis Date   Anemia    Anxiety    nervous sometimes, denies panica ttacks    Arthritis    knees ?   Blood transfusion    post vaginal birth- 1960    Breast cancer (HCC)    Right Breast Cancer   Cancer (HCC)    R breast cancer   Dry mouth    Hypertension    Neuromuscular disorder (HCC)    L leg, thigh- "burns sometimes"   Shortness of breath    sometimes    Use of letrozole (Femara) 05/2010   neoadjuvant femara therapy since 1/212     Significant Hospital Events: Including procedures, antibiotic start and stop dates in addition to other pertinent events   02/01/2023 admission to [facility]  02/04/23 PCCM Pulmonary Consult for bronch with biopsies of pulmonary nodules  CT Chest 02/01/2023 Numerous bilateral pulmonary nodules. The largest  lesions will be measured. Right anterior upper lobe pulmonary nodule measuring 1.4 x 1.2 cm on series 4 image 45. Posterior right upper lobe pulmonary nodule measuring 14 x 10 mm on series 4, image 54. Left upper lobe posterior pulmonary nodule measuring 1.2 x 1.2 cm on series 4, image 76. Multiple left lower lobe pulmonary nodules including 10 x 9 mm nodule on series 4, image 89 and subpleural left lung base pulmonary nodule measuring 12 by 7 mm on series 4, image 105. Multiple additional bilateral pulmonary nodules are noted. Numerous bilateral pulmonary nodules suspicious for metastatic disease.  CT Abdomen/ Pelvis 02/01/2023 11 cm enhancing mass involving the mid to lower pole of the right kidney most compatible with renal cell carcinoma. Interim History / Subjective:  States she feels better since admission and receiving blood. No history of hemoptysis. Short of breath when anemic, but has resolved since transfusion. She has had weight loss, but recently.   Objective   Blood pressure 115/60, pulse 91, temperature 98.4 F (36.9 C), temperature source Oral, resp. rate 18, height 5\' 8"  (1.727 m), weight 89.7 kg, SpO2 97%.        Intake/Output Summary (Last 24 hours) at 02/04/2023 1441 Last data filed at 02/04/2023 0902 Gross per 24 hour  Intake 180 ml  Output --  Net 180 ml   Filed Weights   02/03/23 0503 02/03/23 0913 02/04/23 0240  Weight: 94.8 kg 94.8 kg 89.7 kg    Examination: General:  Awake and alert,  in NAD HENT:  [place], AT, thick neck, MM dry, No LAD Lungs:  Bilateral chest excursion, clear, diminished per bases Cardiovascular:  S1, S2, RRR, No RMG Abdomen:  [place]of[place], NT, ND, BS +, Body mass index is 30.07 kg/m.  Extremities:  No obvious deformities, warm and dry, brisk capillary refill Neuro:  Awake and alert, follows commands, MAE x 4, appropriate GU:  Bed side commode  Resolved Hospital Problem list     Assessment & Plan:  Multiple pulmonary nodules suspicious  for metastatic disease  Large right renal mass. History of right sided breast cancer  Never smoker  Plan Biopsies can be done with inpatient vs outpatient path. If patient remains IP, can see when there is availability of bronch with biopsies on either Dr. Delton Coombes or Icard's schedules with endoscopy availability.  If unable to schedule as an inpatient , we can get a PET scan as an OP, and plan biopsies based on best site for tissue sampling. She is not on blood thinners.  As she is a never smoker, tissue should be sent for genetic and molecular markers.   Will schedule based on Endoscopy suite availability and Provider ability. May be as an OP.    Best Practice (right click and "Reselect all SmartList Selections" daily)   Per Primary Team Patient and daughter updated at bedside 02/04/2023.  Labs   CBC: Recent Labs  Lab 02/01/23 1515 02/01/23 2107 02/02/23 0941 02/02/23 1802 02/03/23 0849 02/03/23 1910 02/04/23 0802  WBC 14.4* 13.8*  --   --   --   --   --   HGB 7.0* 6.7* 9.2* 10.6* 8.8* 8.7* 8.2*  HCT 24.1* 23.0* 30.2* 35.9* 28.3* 29.0* 27.5*  MCV 75.5* 74.2*  --   --   --   --   --   PLT 599* 565*  --   --   --   --   --     Basic Metabolic Panel: Recent Labs  Lab 02/01/23 1109 02/01/23 1515 02/02/23 0941 02/03/23 0849  NA 136 136 135 138  K 4.0 3.4* 3.4* 3.7  CL 98 99 98 100  CO2 25 25 24  21*  GLUCOSE 115* 110* 145* 106*  BUN 12 11 9  7*  CREATININE 0.82 1.05* 0.84 0.83  CALCIUM 8.3* 8.3* 8.6* 8.5*  MG  --   --  1.9 1.8  PHOS  --   --  3.7  --    GFR: Estimated Creatinine Clearance: 61.2 mL/min (by C-G formula based on SCr of 0.83 mg/dL). Recent Labs  Lab 02/01/23 1515 02/01/23 2107  WBC 14.4* 13.8*    Liver Function Tests: Recent Labs  Lab 02/01/23 1109 02/01/23 1515  AST 15 19  ALT 15 20  ALKPHOS  --  80  BILITOT 0.4 0.7  PROT 6.5 7.1  ALBUMIN  --  1.9*   No results for input(s): "LIPASE", "AMYLASE" in the last 168 hours. No results for  input(s): "AMMONIA" in the last 168 hours.  ABG No results found for: "PHART", "PCO2ART", "PO2ART", "HCO3", "TCO2", "ACIDBASEDEF", "O2SAT"   Coagulation Profile: Recent Labs  Lab 02/02/23 0941  INR 1.6*    Cardiac Enzymes: No results for input(s): "CKTOTAL", "CKMB", "CKMBINDEX", "TROPONINI" in the last 168 hours.  HbA1C: No results found for: "HGBA1C"  CBG: No results for input(s): "GLUCAP" in the last 168 hours.  Review of Systems:   Weak, and tired.  Initial dyspnea that resolved with blood transfusion.   Past Medical History:  She,  has a past medical history of Anemia, Anxiety, Arthritis, Blood transfusion, Breast cancer (HCC), Cancer (HCC), Dry mouth, Hypertension, Neuromuscular disorder (HCC), Shortness of breath, and Use of letrozole (Femara) (05/2010).   Surgical History:   Past Surgical History:  Procedure Laterality Date   ABDOMINAL HYSTERECTOMY     BREAST LUMPECTOMY     BREAST SURGERY     Right   jp drains     s/p masectomy 06/11/11/ Dr. [name], 2 jp drains right chest wall   MASTECTOMY Right    malignant   MASTECTOMY W/ SENTINEL NODE BIOPSY  06/11/2011/Right BReast   Procedure: MASTECTOMY WITH SENTINEL LYMPH NODE BIOPSY;  Surgeon: Ernestene Mention, MD;  Location: [facility] OR;  Service: General;  Laterality: Right;  Right total mastectomy and sentinel lymph node biopsy using lymphatic mapping and blue dye injection.   TUBAL LIGATION     [place] FINE NEEDLE ASPIRATION WO/ W SMEAR  05/22/10   Left Breast- cyst or abscess      Social History:   reports that she has never smoked. She has never used smokeless tobacco. She reports that she does not drink alcohol and does not use drugs.   Family History:  Her family history includes Breast cancer in her sister. There is no history of Anesthesia problems, Hypotension, Malignant hyperthermia, or Pseudochol deficiency.   Allergies Allergies  Allergen Reactions   Fosamax [Alendronate Sodium] Other (See Comments)     Aches and pains     Home Medications  Prior to Admission medications   Medication Sig Start Date End Date Taking? Authorizing Provider  amLODipine (NORVASC) 5 MG tablet TAKE 2 TABLETS BY MOUTH EVERY DAY EQUAL 10MG  PER DAY 07/19/22  Yes Donita Brooks, MD  B Complex-C-Folic Acid (MULTIVITAMIN, STRESS FORMULA) tablet Take 1 tablet by mouth daily. gummie bear MVT   Yes [provider]  Calcium Carbonate-Vit D-Min (CALTRATE PLUS PO) Take by mouth. 1200MG  OF CALCIUM & 800IU OF VITAMIN D   Yes [provider]  furosemide (LASIX) 40 MG tablet Take 1 tablet (40 mg total) by mouth daily as needed. 09/27/20  Yes Donita Brooks, MD  Iron, Ferrous Sulfate, 325 (65 Fe) MG TABS Take 325 mg by mouth daily. 01/14/23  Yes Donita Brooks, MD  ezetimibe (ZETIA) 10 MG tablet Take 1 tablet (10 mg total) by mouth daily. Patient not taking: Reported on 02/02/2023 12/19/21   Donita Brooks, MD  levocetirizine Elita Boone ALLERGY 24HR) 5 MG tablet Take 1 tablet (5 mg total) by mouth every evening. Patient not taking: Reported on 02/02/2023 12/15/21   Donita Brooks, MD     Critical care time: 74 minutes    Bevelyn Ngo, MSN, AGACNP-BC [facility] Pulmonary/Critical Care Medicine See Amion for personal pager PCCM on call pager [phone]

## 2023-02-04 NOTE — Consult Note (Signed)
[facility] ADMISSION NOTE  Patient Care Team: Donita Brooks, MD as PCP - General (Family Medicine)   ASSESSMENT & PLAN:  82 y.o.woman with remote field stage Ia right breast invasive lobular carcinoma s/p right breast mastectomy with right axillary lymph node biopsy, 7 years of AI, hypertension, arthritis presented with iron deficiency anemia found to have multiple pulmonary nodules, large right renal mass 11.1 x 9.1 x 6.6 cm.  The larger pulmonary nodules reported 1.4 cm.  Clinical presentation concerning for newly found metastatic RCC.  Pending biopsy.  Agree with biopsy one of the larger lung nodule as able.  Given history of invasive lobular carcinoma, if pathology turns out to be breast primary, patient will follow-up with her breast oncologist Dr. Pamelia Hoit.  Lung metastases Bilateral  Recommend IR biopsy of lung nodule for diagnosis MRI of the brain with and without contrast for staging to rule out brain metastases Bone scan to rule out bone metastasis  Right renal mass Work up as above  Microcytic anemia Likely from renal malignancy, ineffective erythropoiesis Transfusion if symptomatic or hemoglobin less than 7  Gastritis Pending H. pylori testing Continue PPI  I spoke to patient and daughter at bedside about above assessment and plan.  I tried to call son per her request but no answer. [facility] [phone])  All questions were answered. The patient knows to call the clinic with any problems, questions or concerns.    [name], MD 02/04/2023 12:05 PM   CHIEF COMPLAINTS/PURPOSE OF ADMISSION Lung metastases and renal mass  HISTORY OF PRESENTING ILLNESS:  JAMMIE TROUP 82 y.o. female is admitted for anemia.  Patient reports that she developed weakness suddenly and feels like having difficulty walking.  She visited her PCP and found to have significant anemia.  She was referred to ED for further evaluation.  She was found to have hemoglobin 6.7.  Ct  showed large renal mass, lung metastases were identified.  On admission, creatinine 0.82.  Calcium 8.3.  WBC 12.4 hemoglobin 8.4 platelet 559 ANC 9  02/01/23 CTA abdomen and pelvis Large mass off the mid to lower pole of the right kidney with areas of enhancement concerning for renal cell carcinoma. This is new since prior study. This measures 11.1 x 9.1 x 6.6 cm.   02/01/23 CT chest showed multiple pulmonary nodules.  Summary of oncologic history as follows: Oncology History  Breast cancer of upper-outer quadrant of right female breast (HCC)  06/11/2011 Surgery   Right breast mastectomy with right axillary sentinel lymph node biopsy 1.3 cm invasive lobular cancer, grade 1, atypical lobular hyperplasia, focal LV I, ER 91%, PR 25%, HER-2 negative, 0/8 lymph nodes T1 cN0 stage IA   07/25/2011 -  Anti-estrogen oral therapy   Letrozole 2.5 mg daily 7 years was a plan     MEDICAL HISTORY:  Past Medical History:  Diagnosis Date   Anemia    Anxiety    nervous sometimes, denies panica ttacks    Arthritis    knees ?   Blood transfusion    post vaginal birth- 1960    Breast cancer (HCC)    Right Breast Cancer   Cancer (HCC)    R breast cancer   Dry mouth    Hypertension    Neuromuscular disorder (HCC)    L leg, thigh- "burns sometimes"   Shortness of breath    sometimes    Use of letrozole (Femara) 05/2010   neoadjuvant femara therapy since 1/212  SURGICAL HISTORY: Past Surgical History:  Procedure Laterality Date   ABDOMINAL HYSTERECTOMY     BREAST LUMPECTOMY     BREAST SURGERY     Right   jp drains     s/p masectomy 06/11/11/ Dr. [name], 2 jp drains right chest wall   MASTECTOMY Right    malignant   MASTECTOMY W/ SENTINEL NODE BIOPSY  06/11/2011/Right BReast   Procedure: MASTECTOMY WITH SENTINEL LYMPH NODE BIOPSY;  Surgeon: Ernestene Mention, MD;  Location: [facility] OR;  Service: General;  Laterality: Right;  Right total mastectomy and sentinel lymph node biopsy using  lymphatic mapping and blue dye injection.   TUBAL LIGATION     [place] FINE NEEDLE ASPIRATION WO/ W SMEAR  05/22/10   Left Breast- cyst or abscess     SOCIAL HISTORY: Social History   Socioeconomic History   Marital status: Single    Spouse name: Not on file   Number of children: 5   Years of education: Not on file   Highest education level: Not on file  Occupational History   Occupation: Retired  Tobacco Use   Smoking status: Never   Smokeless tobacco: Never  Substance and Sexual Activity   Alcohol use: No   Drug use: No   Sexual activity: Not Currently    Birth control/protection: Post-menopausal  Other Topics Concern   Not on file  Social History Narrative   Not on file   Social Determinants of Health   Financial Resource Strain: Low Risk  (07/25/2022)   Overall Financial Resource Strain (CARDIA)    Difficulty of Paying Living Expenses: Not hard at all  Food Insecurity: No Food Insecurity (02/01/2023)   Hunger Vital Sign    Worried About Running Out of Food in the Last Year: Never true    Ran Out of Food in the Last Year: Never true  Transportation Needs: No Transportation Needs (02/01/2023)   PRAPARE - Administrator, Civil Service (Medical): No    Lack of Transportation (Non-Medical): No  Physical Activity: Sufficiently Active (07/25/2022)   Exercise Vital Sign    Days of Exercise per Week: 7 days    Minutes of Exercise per Session: 150+ min  Stress: No Stress Concern Present (07/25/2022)   [employer] of Occupational Health - Occupational Stress Questionnaire    Feeling of Stress : Not at all  Social Connections: Socially Isolated (07/25/2022)   Social Connection and Isolation Panel [NHANES]    Frequency of Communication with Friends and Family: More than three times a week    Frequency of Social Gatherings with Friends and Family: More than three times a week    Attends Religious Services: Never    Database administrator or Organizations: No     Attends Banker Meetings: Never    Marital Status: Divorced  Catering manager Violence: Not At Risk (02/01/2023)   Humiliation, Afraid, Rape, and Kick questionnaire    Fear of Current or Ex-Partner: No    Emotionally Abused: No    Physically Abused: No    Sexually Abused: No    FAMILY HISTORY: Family History  Problem Relation Age of Onset   Breast cancer Sister    Anesthesia problems Neg Hx    Hypotension Neg Hx    Malignant hyperthermia Neg Hx    Pseudochol deficiency Neg Hx     ALLERGIES:  is allergic to fosamax [alendronate sodium].  MEDICATIONS:  Current Facility-Administered Medications  Medication Dose Route Frequency Provider Last Rate  Last Admin   acetaminophen (TYLENOL) tablet 1,000 mg  1,000 mg Oral Q6H PRN Dolly Rias, MD   1,000 mg at 02/02/23 0431   feeding supplement (ENSURE ENLIVE / ENSURE PLUS) liquid 237 mL  237 mL Oral BID BM Lewie Chamber, MD   237 mL at 02/04/23 0900   ferrous sulfate tablet 325 mg  325 mg Oral Daily Dolly Rias, MD   325 mg at 02/04/23 0859   pantoprazole (PROTONIX) injection 40 mg  40 mg Intravenous Q12H Dolly Rias, MD   40 mg at 02/04/23 0859   sodium chloride flush (NS) 0.9 % injection 3 mL  3 mL Intravenous Q12H Dolly Rias, MD   3 mL at 02/04/23 0900    REVIEW OF SYSTEMS:   Constitutional: Denies fevers; has weakness and fatigue Eyes: Denies blurriness of vision, double vision Ears, nose, mouth, throat, and face: Denies difference in hearing Respiratory: Denies cough, or wheezes, short of breath when admitted Cardiovascular: Denies chest pain, chest discomfort or lower extremity swelling Gastrointestinal:  Denies nausea, vomiting, abdominal pain, bloody stool or melena  Lymphatics: Denies new lymphadenopathy Neurological: Denies numbness, tingling or new weaknesses All other systems were reviewed with the patient and are negative.  PHYSICAL EXAMINATION: ECOG PERFORMANCE STATUS: 1 - Symptomatic but  completely ambulatory  Vitals:   02/04/23 0529 02/04/23 0725  BP: 129/85 115/60  Pulse: 99 91  Resp: 18 18  Temp: 98.7 F (37.1 C) 98.4 F (36.9 C)  SpO2: 98% 97%   Filed Weights   02/03/23 0503 02/03/23 0913 02/04/23 0240  Weight: 209 lb (94.8 kg) 209 lb (94.8 kg) 197 lb 12 oz (89.7 kg)    GENERAL:alert, no distress and comfortable SKIN: skin color, texture, turgor are normal, no rashes EYES: normal,  sclera clear OROPHARYNX: no exudate, no erythema and lips, buccal mucosa NECK: supple, without nodularity LYMPH:  no palpable lymphadenopathy in the cervical or supraclavicular area LUNGS: clear to auscultation and percussion with normal breathing effort No wheeze or rales HEART: regular rate & rhythm and no murmurs and no lower extremity edema ABDOMEN: abdomen soft, non-tender and normal bowel sounds No cva tenderness Musculoskeletal: no swelling PSYCH: alert with fluent speech.  NEURO: no focal motor/sensory deficits. Upper and lower extremity strength equal bilaterally.  Sensation for bilateral  LABORATORY DATA:  I have reviewed the data as listed Lab Results  Component Value Date   WBC 13.8 (H) 02/01/2023   HGB 8.2 (L) 02/04/2023   HCT 27.5 (L) 02/04/2023   MCV 74.2 (L) 02/01/2023   PLT 565 (H) 02/01/2023   Recent Labs    01/11/23 0932 02/01/23 1109 02/01/23 1515 02/02/23 0941 02/03/23 0849  NA 138 136 136 135 138  K 5.0 4.0 3.4* 3.4* 3.7  CL 101 98 99 98 100  CO2 24 25 25 24  21*  GLUCOSE 104* 115* 110* 145* 106*  BUN 13 12 11 9  7*  CREATININE 0.91 0.82 1.05* 0.84 0.83  CALCIUM 8.8 8.3* 8.3* 8.6* 8.5*  GFRNONAA  --   --  53* >60 >60  PROT 6.9 6.5 7.1  --   --   ALBUMIN  --   --  1.9*  --   --   AST 20 15 19   --   --   ALT 17 15 20   --   --   ALKPHOS  --   --  80  --   --   BILITOT 0.4 0.4 0.7  --   --  RADIOGRAPHIC STUDIES: I have personally reviewed the radiological images as listed and agreed with the findings in the report. CT CHEST W  CONTRAST  Result Date: 02/01/2023 CLINICAL DATA:  Pulmonary nodules renal mass EXAM: CT CHEST WITH CONTRAST TECHNIQUE: Multidetector CT imaging of the chest was performed during intravenous contrast administration. RADIATION DOSE REDUCTION: This exam was performed according to the departmental dose-optimization program which includes automated exposure control, adjustment of the mA and/or kV according to patient size and/or use of iterative reconstruction technique. CONTRAST:  50mL OMNIPAQUE IOHEXOL 350 MG/ML SOLN COMPARISON:  CT 02/01/2023, CT 05/26/2010 FINDINGS: Cardiovascular: Moderate aortic atherosclerosis. Mild aneurysmal dilatation of ascending aorta up to 4.1 cm. Borderline cardiomegaly. No pericardial effusion Mediastinum/Nodes: Midline trachea. 1.5 cm hypodense nodule right lobe of thyroid with calcification but present on 2012 comparison. Stability for greater than 5 years implies benignity; no biopsy or followup indicated (ref: J Am Coll Radiol. 2015 Feb;12(2): 143-50). No suspicious lymph nodes. Esophagus within normal limits. Lungs/Pleura: Lungs demonstrate no acute airspace disease or pleural effusion. Numerous bilateral pulmonary nodules. The largest lesions will be measured. Right anterior upper lobe pulmonary nodule measuring 1.4 x 1.2 cm on series 4 image 45. Posterior right upper lobe pulmonary nodule measuring 14 x 10 mm on series 4, image 54. Left upper lobe posterior pulmonary nodule measuring 1.2 x 1.2 cm on series 4, image 76. Multiple left lower lobe pulmonary nodules including 10 x 9 mm nodule on series 4, image 89 and subpleural left lung base pulmonary nodule measuring 12 by 7 mm on series 4, image 105. Multiple additional bilateral pulmonary nodules are noted. Upper Abdomen: See separately dictated CT Musculoskeletal: No acute or suspicious osseous abnormality. IMPRESSION: 1. Numerous bilateral pulmonary nodules suspicious for metastatic disease. 2. Aortic atherosclerosis. Aortic  Atherosclerosis (ICD10-I70.0). Electronically Signed   By: Jasmine Pang M.D.   On: 02/01/2023 22:03   CT Angio Abd/Pel W and/or Wo Contrast  Result Date: 02/01/2023 CLINICAL DATA:  Lower GI bleed EXAM: CTA ABDOMEN AND PELVIS WITHOUT AND WITH CONTRAST TECHNIQUE: Multidetector CT imaging of the abdomen and pelvis was performed using the standard protocol during bolus administration of intravenous contrast. Multiplanar reconstructed images and MIPs were obtained and reviewed to evaluate the vascular anatomy. RADIATION DOSE REDUCTION: This exam was performed according to the departmental dose-optimization program which includes automated exposure control, adjustment of the mA and/or kV according to patient size and/or use of iterative reconstruction technique. CONTRAST:  75mL OMNIPAQUE IOHEXOL 350 MG/ML SOLN COMPARISON:  06/03/2010 FINDINGS: VASCULAR Aorta: Normal caliber aorta without aneurysm, dissection, vasculitis or significant stenosis. Scattered calcifications. Celiac: Patent without evidence of aneurysm, dissection, vasculitis or significant stenosis. SMA: Patent without evidence of aneurysm, dissection, vasculitis or significant stenosis. Renals: Both renal arteries are patent without evidence of aneurysm, dissection, vasculitis, fibromuscular dysplasia or significant stenosis. IMA: Patent without evidence of aneurysm, dissection, vasculitis or significant stenosis. Inflow: Patent without evidence of aneurysm, dissection, vasculitis or significant stenosis. Scattered calcifications. Proximal Outflow: Bilateral common femoral and visualized portions of the superficial and profunda femoral arteries are patent without evidence of aneurysm, dissection, vasculitis or significant stenosis. Veins: No obvious venous abnormality within the limitations of this arterial phase study. Review of the MIP images confirms the above findings. NON-VASCULAR Lower chest: 8 mm well-circumscribed rounded nodule in the left lower  lobe, new since 20/12. Subpleural nodular area in the left lower lobe measures 11 mm on image 15. Left lower lobe nodule on image 19 measures 8 mm. Right middle lobe nodule  on image 9 measures 4 mm. No effusions. Hepatobiliary: No focal hepatic abnormality. Multiple gallstones within the gallbladder. No biliary ductal dilatation. Pancreas: No focal abnormality or ductal dilatation. Spleen: No focal abnormality.  Normal size. Adrenals/Urinary Tract: Adrenal glands normal. Large mass off the mid to lower pole of the right kidney with areas of enhancement concerning for renal cell carcinoma. This is new since prior study. This measures 11.1 x 9.1 x 6.6 cm. No suspicious left renal abnormality. No hydronephrosis. Urinary bladder unremarkable. Stomach/Bowel: Left colonic diverticulosis. No active diverticulitis. No active contrast extravasation to localize GI bleed. Stomach and small bowel decompressed, unremarkable. Lymphatic: No adenopathy. Reproductive: Prior hysterectomy.  No adnexal masses. Other: No free fluid or free air. Musculoskeletal: No acute bony abnormality. IMPRESSION: VASCULAR No contrast extravasation to localize GI bleed. No evidence of aortic aneurysm or dissection. NON-VASCULAR Approximately 11 cm enhancing mass involving the mid to lower pole of the right kidney most compatible with renal cell carcinoma. Recommend urologic consultation. Cholelithiasis. Left colonic diverticulosis. Electronically Signed   By: Charlett Nose M.D.   On: 02/01/2023 19:12

## 2023-02-04 NOTE — Progress Notes (Signed)
    Progress Note   Assessment    82 year old female with a history of breast cancer status postmastectomy, hypertension, arthritis admitted with IDA now status post upper endoscopy and colonoscopy; also concern for renal cell carcinoma and multiple pulmonary nodules   Recommendations   1.  IDA/erosive gastritis and ascending colon polyps --biopsies for H. pylori pending, polyps appear adenomatous but were removed.  Piecemeal technique. -- Outpatient GI follow-up to be arranged; patient may be appropriate for repeat colonoscopy in 6 months given piecemeal resection of right colon polyps; this will be determined based on further evaluation for what is probably RCC with pulmonary involvement -- Replace iron -- Dr. Elnoria Howard follow-up for H. pylori and notify patient if treatment is recommended  GI signing off  Call if questions     Chief Complaint   No complaints today, tolerated full breakfast Daughter at bedside  Vital signs in last 24 hours: Temp:  [97.9 F (36.6 C)-99.4 F (37.4 C)] 98.4 F (36.9 C) (09/30 0725) Pulse Rate:  [90-100] 91 (09/30 0725) Resp:  [18] 18 (09/30 0725) BP: (115-144)/(59-87) 115/60 (09/30 0725) SpO2:  [97 %-99 %] 97 % (09/30 0725) Weight:  [89.7 kg] 89.7 kg (09/30 0240) Last BM Date : 02/03/23 Gen: awake, alert, NAD HEENT: anicteric  Pulm: CTA b/l Abd: soft, NT/ND, +BS throughout Neuro: nonfocal   Intake/Output from previous day: 09/29 0701 - 09/30 0700 In: 600 [I.V.:600] Out: -  Intake/Output this shift: Total I/O In: 180 [P.O.:180] Out: -   Lab Results: Recent Labs    02/01/23 1515 02/01/23 2107 02/02/23 0941 02/03/23 0849 02/03/23 1910 02/04/23 0802  WBC 14.4* 13.8*  --   --   --   --   HGB 7.0* 6.7*   < > 8.8* 8.7* 8.2*  HCT 24.1* 23.0*   < > 28.3* 29.0* 27.5*  PLT 599* 565*  --   --   --   --    < > = values in this interval not displayed.   BMET Recent Labs    02/01/23 1515 02/02/23 0941 02/03/23 0849  NA 136 135  138  K 3.4* 3.4* 3.7  CL 99 98 100  CO2 25 24 21*  GLUCOSE 110* 145* 106*  BUN 11 9 7*  CREATININE 1.05* 0.84 0.83  CALCIUM 8.3* 8.6* 8.5*   LFT Recent Labs    02/01/23 1515  PROT 7.1  ALBUMIN 1.9*  AST 19  ALT 20  ALKPHOS 80  BILITOT 0.7   PT/INR Recent Labs    02/02/23 0941  LABPROT 18.8*  INR 1.6*   Hepatitis Panel No results for input(s): "HEPBSAG", "HCVAB", "HEPAIGM", "HEPBIGM" in the last 72 hours.  Studies/Results: No results found.    LOS: 2 days   Beverley Fiedler, MD 02/04/2023, 1:50 PM See Loretha Stapler,  GI, to contact our on call provider

## 2023-02-05 ENCOUNTER — Encounter (HOSPITAL_COMMUNITY): Payer: Self-pay | Admitting: Gastroenterology

## 2023-02-05 ENCOUNTER — Inpatient Hospital Stay (HOSPITAL_COMMUNITY): Payer: Medicare HMO

## 2023-02-05 DIAGNOSIS — D509 Iron deficiency anemia, unspecified: Secondary | ICD-10-CM | POA: Diagnosis not present

## 2023-02-05 DIAGNOSIS — C641 Malignant neoplasm of right kidney, except renal pelvis: Secondary | ICD-10-CM | POA: Diagnosis not present

## 2023-02-05 LAB — COMPLETE METABOLIC PANEL WITH GFR
AG Ratio: 0.7 (calc) — ABNORMAL LOW (ref 1.0–2.5)
ALT: 15 U/L (ref 6–29)
AST: 15 U/L (ref 10–35)
Albumin: 2.7 g/dL — ABNORMAL LOW (ref 3.6–5.1)
Alkaline phosphatase (APISO): 85 U/L (ref 37–153)
BUN: 12 mg/dL (ref 7–25)
CO2: 25 mmol/L (ref 20–32)
Calcium: 8.3 mg/dL — ABNORMAL LOW (ref 8.6–10.4)
Chloride: 98 mmol/L (ref 98–110)
Creat: 0.82 mg/dL (ref 0.60–0.95)
Globulin: 3.8 g/dL — ABNORMAL HIGH (ref 1.9–3.7)
Glucose, Bld: 115 mg/dL — ABNORMAL HIGH (ref 65–99)
Potassium: 4 mmol/L (ref 3.5–5.3)
Sodium: 136 mmol/L (ref 135–146)
Total Bilirubin: 0.4 mg/dL (ref 0.2–1.2)
Total Protein: 6.5 g/dL (ref 6.1–8.1)
eGFR: 71 mL/min/{1.73_m2} (ref >=60)

## 2023-02-05 LAB — PROTEIN ELECTROPHORESIS, SERUM
Albumin ELP: 2.2 g/dL — ABNORMAL LOW (ref 3.8–4.8)
Alpha 1: 0.8 g/dL — ABNORMAL HIGH (ref 0.2–0.3)
Alpha 2: 1.7 g/dL — ABNORMAL HIGH (ref 0.5–0.9)
Beta 2: 0.5 g/dL (ref 0.2–0.5)
Beta Globulin: 0.3 g/dL — ABNORMAL LOW (ref 0.4–0.6)
Gamma Globulin: 0.9 g/dL (ref 0.8–1.7)
Total Protein: 6.4 g/dL (ref 6.1–8.1)

## 2023-02-05 LAB — HEMOGLOBIN AND HEMATOCRIT, BLOOD
HCT: 24.7 % — ABNORMAL LOW (ref 36.0–46.0)
HCT: 30.5 % — ABNORMAL LOW (ref 36.0–46.0)
Hemoglobin: 7.6 g/dL — ABNORMAL LOW (ref 12.0–15.0)
Hemoglobin: 9 g/dL — ABNORMAL LOW (ref 12.0–15.0)

## 2023-02-05 MED ORDER — TECHNETIUM TC 99M MEDRONATE IV KIT
20.0000 | PACK | Freq: Once | INTRAVENOUS | Status: AC | PRN
  Administered 2023-02-05: 20 via INTRAVENOUS

## 2023-02-05 NOTE — Plan of Care (Signed)

## 2023-02-05 NOTE — Care Management Important Message (Signed)
Important Message  Patient Details  Name: Diane Paul MRN: [id number] Date of Birth: [birth year]   Important Message Given:  Yes - Medicare IM     Dorena Bodo 02/05/2023, 2:43 PM

## 2023-02-05 NOTE — Progress Notes (Signed)
Progress Note    Diane Paul   ZOX:[id number]  DOB: November 19, 1940  DOA: 02/01/2023     3 PCP: Donita Brooks, MD  Initial CC: fatigue, low Hgb  Hospital Course: Ms. Coxwell is an 82 yo female with PMH right breast cancer s/p mastectomy, HTN, arthritis, iron deficiency anemia who was referred to the hospital after worsening anemia on outpatient labs.  She had recently undergone evaluation with primary care on 02/01/2023 due to worsening fatigue.  Anemic workup was initiated. She had denied any abdominal pain or change in stools notably negative for melena. Hemoglobin was noted to be 7 g/dL and down trended to 6.7 g/dL.  She transfused 1 unit PRBC with improvement in hemoglobin. CT angio abdomen/pelvis was performed initially looking for possible bleeding etiology.  This was negative for evidence of GI bleed but showed underlying 11 cm right kidney mass concerning for RCC.  This was then followed by CT chest which showed numerous bilateral pulmonary nodules concerning for metastatic lesions. She was admitted for GI evaluation and urology evaluation. She underwent EGD and colonoscopy on 02/03/2023. She will follow-up outpatient with urology to discuss nephrectomy.  Interval History:  No bowel movements since colonoscopy. Tentative plan for bronchoscopy with pulmonology on Friday. Everything else will be arranged for outpatient with oncology and urology.  Assessment and Plan:  Microcytic anemia Iron deficiency anemia -Prior baseline hemoglobin noted to be around 12 g/dL in August 2023 but was noted to be 8.4 g/dL on 12/05/1912 -Hemolytic anemia ruled out -Has further down trended and noted to be as low as 6.7 g/dL after admission - Received 1 unit PRBC; hemoglobin improved to 9.2 g/dL posttransfusion -EGD revealed erosive gastropathy without stigmata of recent bleeding and underlying gastritis -Colonoscopy showed 2 polyps which were removed -Follow-up H. pylori testing; if positive  quadruple therapy recommended per GI; if negative, capsule endoscopy recommended -Continue trending hemoglobin; still downtrending some -Iron stores technically normal with slightly increased iron sat ratio, again possibly in setting of underlying RCC - continue PPI BID   Right renal cell carcinoma Multiple pulmonary nodules CTA with incidental finding of right renal mass 11.1 x 9.1 x 6.6 cm concerning for RCC.  - patient and daughters aware of findings including lung nodules -Outpatient follow-up with urology planned for nephrectomy -Oncology aware, she will follow-up with Dr. Cherly Hensen -Pulmonology consulted for bronchoscopy which is planned for 10/4   Thrombocytosis -Suspect reactive in setting of IDA versus paraneoplastic   Leukocytosis -Suspect related to RCC   Weight loss  Review of weight trend shows 20 pound weight loss since 6/24.  Albumin is 1.9.  -Nutrition consulted   Hypokalemia -Repleted   Chronic medical problems: Breast cancer with history of mastectomy: Outpatient oncology follow-up Hypertension: No longer on antihypertensives   Old records reviewed in assessment of this patient  Antimicrobials:   DVT prophylaxis:  SCDs Start: 02/01/23 2022   Code Status:   Code Status: Full Code  Mobility Assessment (Last 72 Hours)     Mobility Assessment     Row Name 02/05/23 1030 02/04/23 2211 02/04/23 1026 02/04/23 0751 02/03/23 2125   Does patient have an order for bedrest or is patient medically unstable No - Continue assessment No - Continue assessment No - Continue assessment No - Continue assessment No - Continue assessment   What is the highest level of mobility based on the progressive mobility assessment? Level 4 (Walks with assist in room) - Balance while marching in place and cannot step forward  and back - Complete Level 4 (Walks with assist in room) - Balance while marching in place and cannot step forward and back - Complete Level 5 (Walks with assist in  room/hall) - Balance while stepping forward/back and can walk in room with assist - Complete Level 5 (Walks with assist in room/hall) - Balance while stepping forward/back and can walk in room with assist - Complete Level 4 (Walks with assist in room) - Balance while marching in place and cannot step forward and back - Complete    Row Name 02/03/23 1115 02/02/23 2141         Does patient have an order for bedrest or is patient medically unstable No - Continue assessment No - Continue assessment      What is the highest level of mobility based on the progressive mobility assessment? Level 4 (Walks with assist in room) - Balance while marching in place and cannot step forward and back - Complete Level 4 (Walks with assist in room) - Balance while marching in place and cannot step forward and back - Complete               Barriers to discharge: none Disposition Plan:  Home possibly Friday after bronchoscopy if hemoglobin stable and approved by pulmonology Status is: Inpt  Objective: Blood pressure (!) 110/53, pulse 93, temperature 98.2 F (36.8 C), temperature source Oral, resp. rate 18, height 5\' 8"  (1.727 m), weight 89.7 kg, SpO2 96%.  Examination:  Physical Exam Constitutional:      Appearance: Normal appearance.  HENT:     Head: Normocephalic and atraumatic.     Mouth/Throat:     Mouth: Mucous membranes are moist.  Eyes:     Extraocular Movements: Extraocular movements intact.  Cardiovascular:     Rate and Rhythm: Normal rate and regular rhythm.  Pulmonary:     Effort: Pulmonary effort is normal. No respiratory distress.     Breath sounds: Normal breath sounds. No wheezing.  Abdominal:     General: Bowel sounds are normal. There is no distension.     Palpations: Abdomen is soft.     Tenderness: There is no abdominal tenderness.  Musculoskeletal:        General: Normal range of motion.     Cervical back: Normal range of motion and neck supple.  Skin:    General: Skin is  warm and dry.  Neurological:     General: No focal deficit present.     Mental Status: She is alert.  Psychiatric:        Mood and Affect: Mood normal.      Consultants:  Urology GI Pulmonology   Procedures:  02/03/2023: EGD and colonoscopy  Data Reviewed: Results for orders placed or performed during the hospital encounter of 02/01/23 (from the past 24 hour(s))  Hemoglobin and hematocrit, blood     Status: Abnormal   Collection Time: 02/04/23  6:38 PM  Result Value Ref Range   Hemoglobin 8.2 (L) 12.0 - 15.0 g/dL   HCT 28.3 (L) 15.1 - 76.1 %  Hemoglobin and hematocrit, blood     Status: Abnormal   Collection Time: 02/05/23  6:44 AM  Result Value Ref Range   Hemoglobin 7.6 (L) 12.0 - 15.0 g/dL   HCT 60.7 (L) 37.1 - 06.2 %    I have reviewed pertinent nursing notes, vitals, labs, and images as necessary. I have ordered labwork to follow up on as indicated.  I have reviewed the last notes from  staff over past 24 hours. I have discussed patient's care plan and test results with nursing staff, CM/SW, and other staff as appropriate.    LOS: 3 days   Lewie Chamber, MD [facility] 02/05/2023, 1:50 PM

## 2023-02-05 NOTE — Progress Notes (Signed)
Diane Paul   DOB:July 13, 1940   WU#:[id number]    ASSESSMENT & PLAN:  82 y.o.woman with remote field stage Ia right breast invasive lobular carcinoma s/p right breast mastectomy with right axillary lymph node biopsy, 7 years of AI, hypertension, arthritis presented with iron deficiency anemia found to have multiple pulmonary nodules, large right renal mass 11.1 x 9.1 x 6.6 cm.  The larger pulmonary nodules reported 1.4 cm.   Clinical presentation concerning for newly found metastatic RCC.  Pending biopsy.  Agree with biopsy one of the larger lung nodule as able.  Given history of invasive lobular carcinoma, if pathology turns out to be breast primary, patient will follow-up with her breast oncologist Dr. Pamelia Hoit.  MRI of the brain negative for metastases.  Spoke to patient, 2 daughters at bedside, and son over the phone today.   Lung metastases Bilateral  Pending bronchoscopy with biopsy.  Appreciate pulmonary evaluation MRI of the brain negative for metastases Bone scan to rule out bone metastasis, results pending   Right renal mass Work up as above   Microcytic anemia Likely from renal malignancy, ineffective erythropoiesis Transfusion if symptomatic or hemoglobin less than 7   Gastritis Pending H. pylori testing Continue PPI  Discharge planning I will follow-up with patient as outpatient next Thursday to allow a few days for pathology results to be available.  Spoke to patient and family.  I will request outpatient appointment.  All questions were answered. The patient knows to call the clinic with any problems, questions or concerns.    Diane Sartorius, MD 02/05/2023 5:32 PM  Subjective:  Kidney mass  She reports feeling well.  Able to ambulate without much help.  She is eager to get up.  Tolerating p.o.  No bleeding, hematuria or bloody stool.  No chest pain, wheezing, short of breath, coughing or abdominal pain.  Objective:  Vitals:   02/05/23 0752 02/05/23 1619  BP:  (!) 110/53 137/61  Pulse: 93 91  Resp: 18 18  Temp: 98.2 F (36.8 C) 98 F (36.7 C)  SpO2: 96% 97%     Intake/Output Summary (Last 24 hours) at 02/05/2023 1732 Last data filed at 02/05/2023 1016 Gross per 24 hour  Intake 236 ml  Output --  Net 236 ml    GENERAL: alert, no distress and comfortable SKIN: skin color normal EYES: normal, sclera clear OROPHARYNX: moist NECK: supple, no palpable mass LYMPH:  no palpable cervical lymphadenopathy LUNGS: No wheeze, rales and clear to auscultation bilaterally with normal breathing effort.  HEART: regular rate & rhythm  ABDOMEN: abdomen soft, non-tender and non-distended Musculoskeletal: no lower extremity edema NEURO: alert and oriented x 3   Labs:  Recent Labs    01/11/23 0932 02/01/23 1109 02/01/23 1515 02/02/23 0941 02/03/23 0849  NA 138 136 136 135 138  K 5.0 4.0 3.4* 3.4* 3.7  CL 101 98 99 98 100  CO2 24 25 25 24  21*  GLUCOSE 104* 115* 110* 145* 106*  BUN 13 12 11 9  7*  CREATININE 0.91 0.82 1.05* 0.84 0.83  CALCIUM 8.8 8.3* 8.3* 8.6* 8.5*  GFRNONAA  --   --  53* >60 >60  PROT 6.9 6.5  6.4 7.1  --   --   ALBUMIN  --   --  1.9*  --   --   AST 20 15 19   --   --   ALT 17 15 20   --   --   ALKPHOS  --   --  80  --   --   BILITOT 0.4 0.4 0.7  --   --     Studies:  MR BRAIN W WO CONTRAST  Result Date: 02/05/2023 CLINICAL DATA:  Kidney cancer staging. EXAM: MRI HEAD WITHOUT AND WITH CONTRAST TECHNIQUE: Multiplanar, multiecho pulse sequences of the brain and surrounding structures were obtained without and with intravenous contrast. CONTRAST:  8.109mL GADAVIST GADOBUTROL 1 MMOL/ML IV SOLN COMPARISON:  None Available. FINDINGS: Brain: There is no evidence of an acute infarct, intracranial hemorrhage, mass, midline shift, or extra-axial fluid collection. Mild cerebral atrophy is within normal limits for age. Scattered small T2 hyperintensities in the cerebral white matter nonspecific but compatible with minimal chronic small  vessel ischemic disease. No abnormal enhancement is identified. Vascular: Major intracranial vascular flow voids are preserved. Skull and upper cervical spine: No suspicious marrow lesion. Sinuses/Orbits: Bilateral cataract extraction. Minimal mucosal thickening in the right maxillary sinus. Clear mastoid air cells. Other: None. IMPRESSION: No evidence of intracranial metastases. Electronically Signed   By: Sebastian Ache M.D.   On: 02/05/2023 13:14   CT CHEST W CONTRAST  Result Date: 02/01/2023 CLINICAL DATA:  Pulmonary nodules renal mass EXAM: CT CHEST WITH CONTRAST TECHNIQUE: Multidetector CT imaging of the chest was performed during intravenous contrast administration. RADIATION DOSE REDUCTION: This exam was performed according to the departmental dose-optimization program which includes automated exposure control, adjustment of the mA and/or kV according to patient size and/or use of iterative reconstruction technique. CONTRAST:  50mL OMNIPAQUE IOHEXOL 350 MG/ML SOLN COMPARISON:  CT 02/01/2023, CT 05/26/2010 FINDINGS: Cardiovascular: Moderate aortic atherosclerosis. Mild aneurysmal dilatation of ascending aorta up to 4.1 cm. Borderline cardiomegaly. No pericardial effusion Mediastinum/Nodes: Midline trachea. 1.5 cm hypodense nodule right lobe of thyroid with calcification but present on 2012 comparison. Stability for greater than 5 years implies benignity; no biopsy or followup indicated (ref: J Am Coll Radiol. 2015 Feb;12(2): 143-50). No suspicious lymph nodes. Esophagus within normal limits. Lungs/Pleura: Lungs demonstrate no acute airspace disease or pleural effusion. Numerous bilateral pulmonary nodules. The largest lesions will be measured. Right anterior upper lobe pulmonary nodule measuring 1.4 x 1.2 cm on series 4 image 45. Posterior right upper lobe pulmonary nodule measuring 14 x 10 mm on series 4, image 54. Left upper lobe posterior pulmonary nodule measuring 1.2 x 1.2 cm on series 4, image 76.  Multiple left lower lobe pulmonary nodules including 10 x 9 mm nodule on series 4, image 89 and subpleural left lung base pulmonary nodule measuring 12 by 7 mm on series 4, image 105. Multiple additional bilateral pulmonary nodules are noted. Upper Abdomen: See separately dictated CT Musculoskeletal: No acute or suspicious osseous abnormality. IMPRESSION: 1. Numerous bilateral pulmonary nodules suspicious for metastatic disease. 2. Aortic atherosclerosis. Aortic Atherosclerosis (ICD10-I70.0). Electronically Signed   By: Jasmine Pang M.D.   On: 02/01/2023 22:03   CT Angio Abd/Pel W and/or Wo Contrast  Result Date: 02/01/2023 CLINICAL DATA:  Lower GI bleed EXAM: CTA ABDOMEN AND PELVIS WITHOUT AND WITH CONTRAST TECHNIQUE: Multidetector CT imaging of the abdomen and pelvis was performed using the standard protocol during bolus administration of intravenous contrast. Multiplanar reconstructed images and MIPs were obtained and reviewed to evaluate the vascular anatomy. RADIATION DOSE REDUCTION: This exam was performed according to the departmental dose-optimization program which includes automated exposure control, adjustment of the mA and/or kV according to patient size and/or use of iterative reconstruction technique. CONTRAST:  75mL OMNIPAQUE IOHEXOL 350 MG/ML SOLN COMPARISON:  06/03/2010 FINDINGS: VASCULAR Aorta: Normal caliber  aorta without aneurysm, dissection, vasculitis or significant stenosis. Scattered calcifications. Celiac: Patent without evidence of aneurysm, dissection, vasculitis or significant stenosis. SMA: Patent without evidence of aneurysm, dissection, vasculitis or significant stenosis. Renals: Both renal arteries are patent without evidence of aneurysm, dissection, vasculitis, fibromuscular dysplasia or significant stenosis. IMA: Patent without evidence of aneurysm, dissection, vasculitis or significant stenosis. Inflow: Patent without evidence of aneurysm, dissection, vasculitis or significant  stenosis. Scattered calcifications. Proximal Outflow: Bilateral common femoral and visualized portions of the superficial and profunda femoral arteries are patent without evidence of aneurysm, dissection, vasculitis or significant stenosis. Veins: No obvious venous abnormality within the limitations of this arterial phase study. Review of the MIP images confirms the above findings. NON-VASCULAR Lower chest: 8 mm well-circumscribed rounded nodule in the left lower lobe, new since 20/12. Subpleural nodular area in the left lower lobe measures 11 mm on image 15. Left lower lobe nodule on image 19 measures 8 mm. Right middle lobe nodule on image 9 measures 4 mm. No effusions. Hepatobiliary: No focal hepatic abnormality. Multiple gallstones within the gallbladder. No biliary ductal dilatation. Pancreas: No focal abnormality or ductal dilatation. Spleen: No focal abnormality.  Normal size. Adrenals/Urinary Tract: Adrenal glands normal. Large mass off the mid to lower pole of the right kidney with areas of enhancement concerning for renal cell carcinoma. This is new since prior study. This measures 11.1 x 9.1 x 6.6 cm. No suspicious left renal abnormality. No hydronephrosis. Urinary bladder unremarkable. Stomach/Bowel: Left colonic diverticulosis. No active diverticulitis. No active contrast extravasation to localize GI bleed. Stomach and small bowel decompressed, unremarkable. Lymphatic: No adenopathy. Reproductive: Prior hysterectomy.  No adnexal masses. Other: No free fluid or free air. Musculoskeletal: No acute bony abnormality. IMPRESSION: VASCULAR No contrast extravasation to localize GI bleed. No evidence of aortic aneurysm or dissection. NON-VASCULAR Approximately 11 cm enhancing mass involving the mid to lower pole of the right kidney most compatible with renal cell carcinoma. Recommend urologic consultation. Cholelithiasis. Left colonic diverticulosis. Electronically Signed   By: Charlett Nose M.D.   On:  02/01/2023 19:12

## 2023-02-06 DIAGNOSIS — D509 Iron deficiency anemia, unspecified: Secondary | ICD-10-CM | POA: Diagnosis not present

## 2023-02-06 LAB — HEMOGLOBIN AND HEMATOCRIT, BLOOD
HCT: 26.6 % — ABNORMAL LOW (ref 36.0–46.0)
Hemoglobin: 8.2 g/dL — ABNORMAL LOW (ref 12.0–15.0)

## 2023-02-06 LAB — SURGICAL PATHOLOGY

## 2023-02-06 MED ORDER — PANTOPRAZOLE SODIUM 40 MG PO TBEC: 40.0000 mg | DELAYED_RELEASE_TABLET | Freq: Two times a day (BID) | ORAL | 0 refills | Status: DC

## 2023-02-06 MED ORDER — ENSURE ENLIVE PO LIQD: 237.0000 mL | Freq: Two times a day (BID) | ORAL | 0 refills | Status: DC

## 2023-02-06 MED ORDER — ENSURE ENLIVE PO LIQD: 237.0000 mL | Freq: Two times a day (BID) | ORAL | 0 refills | Status: AC

## 2023-02-06 NOTE — Plan of Care (Signed)

## 2023-02-06 NOTE — Discharge Summary (Signed)
Physician Discharge Summary  Diane Paul WUJ:[id number] DOB: Aug 02, 1940 DOA: 02/01/2023  PCP: Donita Brooks, MD  Admit date: 02/01/2023 Discharge date: 02/06/2023 Recommendations for Outpatient Follow-up:  Follow up with PCP in 1 weeks-call for appointment FU w/ Dr Delton Coombes For Bronchoscopy on oct 8 Please obtain BMP/CBC in one week  Discharge Dispo: HOME Discharge Condition: Stable Code Status:   Code Status: Full Code Diet recommendation:  Diet Order             Diet regular Room service appropriate? Yes; Fluid consistency: Thin  Diet effective now                   Brief/Interim Summary: 69 yof w/ right breast cancer s/p mastectomy, HTN, arthritis, iron deficiency anemia who was referred to the hospital after worsening anemia on outpatient labs.She had recently undergone evaluation with primary care on 02/01/2023 due to worsening fatigue.  Anemic workup was initiated.She had denied any abdominal pain or change in stools notably negative for melena. Hemoglobin was noted to be 7 g/dL and down trended to 6.7 g/dL.She was transfused 1 unit PRBC 9/28  with improvement in hemoglobin. CT angio abdomen/pelvis was performed initially looking for possible bleeding etiology.  This was negative for evidence of GI bleed but showed underlying 11 cm right kidney mass concerning for RCC.  This was then followed by CT chest which showed numerous bilateral pulmonary nodules concerning for metastatic lesions.  She was admitted for GI and urology evaluation underwent EGD and colonoscopy on 02/03/2023. Planning for outpatient urology follow-up to discuss nephrectomy and Metopirone planning for bronchoscopy coming Friday She was monitored overnight> hemoglobin trending as  6.7> 9.2> 8.2> 8.2> 7.6> 9.0> 8.2 g  Overall doing well. Saw her with daughter at bedside, agreeable with plan for home today and return friday for outpatient bronchoscopy.she has been ambulating without assistance, she has  Bronchoscopy planned for Friday as outpatient.     Discharge Diagnoses:  Principal Problem:   Microcytic anemia Active Problems:   Renal cell carcinoma (HCC)   Weight loss   Thrombocytosis   Symptomatic anemia   Malignant neoplasm metastatic to both lungs (HCC)  Microcytic anemia Iron deficiency anemia She was admitted for GI and urology evaluation underwent EGD and colonoscopy on 02/03/2023. Planning for outpatient urology follow-up to discuss nephrectomy and Metopirone planning for bronchoscopy coming Friday She was monitored overnight> hemoglobin trending as  6.7> 9.2> 8.2> 8.2> 7.6> 9.0> 8.2 g  Overall doing well. Saw her with daughter at bedside, agreeable with plan for home today and return friday for outpatient bronchoscopy.she has been ambulating without assistance, she has Bronchoscopy planned for Friday as outpatient  -EGD revealed erosive gastropathy without stigmata of recent bleeding and underlying gastritis -Colonoscopy showed 2 polyps which were removed -Follow-up H. pylori testing; if positive quadruple therapy recommended per GI; if negative, capsule endoscopy recommended -Iron stores technically normal with slightly increased iron sat ratio, again possibly in setting of underlying RCC Fu with Dr Elnoria Howard for [facility] and surgical pathology Recent Labs  Lab 02/04/23 0802 02/04/23 1838 02/05/23 0644 02/05/23 1702 02/06/23 0548  HGB 8.2* 8.2* 7.6* 9.0* 8.2*  HCT 27.5* 27.1* 24.7* 30.5* 26.6*     Right renal cell carcinoma Multiple pulmonary nodules CTA with incidental finding of right renal mass 11.1 x 9.1 x 6.6 cm concerning for RCC.  Patient and daughters aware of findings including lung nodules Outpatient follow-up with urology planned for nephrectomy- provided w/ [facility]- also called the  urology office for an appointment and left details. Oncology aware, she will follow-up with Dr. Cherly Hensen Pulmonology consulted for bronchoscopy which is planned for  02/12/23.   Thrombocytosis Suspect reactive in setting of IDA versus paraneoplastic   Leukocytosis Suspect related to RCC Low-grade fever could be in the setting of RCC no other cough or urinary symptoms advised to watch for any kind of fever episodes for cough abdominal pain or urinary symptoms and notify MD or come to the ED   Weight loss  Review of weight trend shows 20 pound weight loss since 6/24.  Albumin is 1.9.  Nutrition Problem: Increased nutrient needs Etiology: chronic illness Signs/Symptoms: estimated needs Interventions: Ensure Enlive (each supplement provides 350kcal and 20 grams of protein)   Hypokalemia -Repleted   HTN-  Recently bp meds been on hold by her pcp  Chronic medical problems: Breast cancer with history of mastectomy: Outpatient oncology follow-up Hypertension: No longer on antihypertensives  Consults: Oncology  Subjective: AAOX3 Ambulating to bathroom herself. No chest pain cough fever or chills  Discharge Exam: Vitals:   02/06/23 0454 02/06/23 0801  BP: (!) 114/59 122/77  Pulse: 93 (!) 110  Resp: 18   Temp: (!) 100.4 F (38 C) 98.2 F (36.8 C)  SpO2: 95% 99%   General: Pt is alert, awake, not in acute distress Cardiovascular: RRR, S1/S2 +, no rubs, no gallops Respiratory: CTA bilaterally, no wheezing, no rhonchi Abdominal: Soft, NT, ND, bowel sounds + Extremities: no edema, no cyanosis  Discharge Instructions  Discharge Instructions     Discharge instructions   Complete by: As directed    Follow up with Dr Delton Coombes on oct 8 for bronchoscopy  Please call call MD or return to ER for similar or worsening recurring problem that brought you to hospital or if any fever,nausea/vomiting,abdominal pain, uncontrolled pain, chest pain,  shortness of breath or any other alarming symptoms.  Please follow-up your doctor as instructed in a week time and call the office for appointment.  Please avoid alcohol, smoking, or any other illicit  substance and maintain healthy habits including taking your regular medications as prescribed.  You were cared for by a hospitalist during your hospital stay. If you have any questions about your discharge medications or the care you received while you were in the hospital after you are discharged, you can call the unit and ask to speak with the hospitalist on call if the hospitalist that took care of you is not available.  Once you are discharged, your primary care physician will handle any further medical issues. Please note that NO REFILLS for any discharge medications will be authorized once you are discharged, as it is imperative that you return to your primary care physician (or establish a relationship with a primary care physician if you do not have one) for your aftercare needs so that they can reassess your need for medications and monitor your lab values   Increase activity slowly   Complete by: As directed       Allergies as of 02/06/2023       Reactions   Fosamax [alendronate Sodium] Other (See Comments)   Aches and pains        Medication List     STOP taking these medications    amLODipine 5 MG tablet Commonly known as: NORVASC   levocetirizine 5 MG tablet Commonly known as: Xyzal Allergy 24HR       TAKE these medications    CALTRATE PLUS PO Take by mouth.  1200MG  OF CALCIUM & 800IU OF VITAMIN D   ezetimibe 10 MG tablet Commonly known as: Zetia Take 1 tablet (10 mg total) by mouth daily.   feeding supplement Liqd Take 237 mLs by mouth 2 (two) times daily between meals.   furosemide 40 MG tablet Commonly known as: LASIX Take 1 tablet (40 mg total) by mouth daily as needed.   Iron (Ferrous Sulfate) 325 (65 Fe) MG Tabs Take 325 mg by mouth daily.   multivitamin, stress formula tablet Take 1 tablet by mouth daily. gummie bear MVT   pantoprazole 40 MG tablet Commonly known as: PROTONIX Take 1 tablet (40 mg total) by mouth 2 (two) times daily.         Follow-up Information     Donita Brooks, MD Follow up in 1 week(s).   Specialty: Family Medicine Contact information: [address] [place] [address] [phone]         [facility] Follow up in 1 week(s).   Contact information: [address] [place] [place] [address] [phone]               Allergies  Allergen Reactions   Fosamax [Alendronate Sodium] Other (See Comments)    Aches and pains    The results of significant diagnostics from this hospitalization (including imaging, microbiology, ancillary and laboratory) are listed below for reference.    Microbiology: No results found for this or any previous visit (from the past 240 hour(s)).  Procedures/Studies: NM Bone Scan Whole Body  Result Date: 02/05/2023 CLINICAL DATA:  11 cm right renal mass, history of breast carcinoma EXAM: NUCLEAR MEDICINE WHOLE BODY BONE SCAN TECHNIQUE: Whole body anterior and posterior images were obtained approximately 3 hours after intravenous injection of radiopharmaceutical. RADIOPHARMACEUTICALS:  21 mCi Technetium-51m MDP IV COMPARISON:  06/01/2010 FINDINGS: Scattered linear punctate activity above the left elbow corresponding to IV injection site. Mildly increased activity in the left mid foot and medial compartments of both knees as before, likely degenerative. Otherwise physiologic distribution of radiopharmaceutical. IMPRESSION: No evidence of osseous metastatic disease. Electronically Signed   By: Corlis Leak M.D.   On: 02/05/2023 18:50   MR BRAIN W WO CONTRAST  Result Date: 02/05/2023 CLINICAL DATA:  Kidney cancer staging. EXAM: MRI HEAD WITHOUT AND WITH CONTRAST TECHNIQUE: Multiplanar, multiecho pulse sequences of the brain and surrounding structures were obtained without and with intravenous contrast. CONTRAST:  8.31mL GADAVIST GADOBUTROL 1 MMOL/ML IV SOLN COMPARISON:  None Available. FINDINGS: Brain: There is no evidence of an acute  infarct, intracranial hemorrhage, mass, midline shift, or extra-axial fluid collection. Mild cerebral atrophy is within normal limits for age. Scattered small T2 hyperintensities in the cerebral white matter nonspecific but compatible with minimal chronic small vessel ischemic disease. No abnormal enhancement is identified. Vascular: Major intracranial vascular flow voids are preserved. Skull and upper cervical spine: No suspicious marrow lesion. Sinuses/Orbits: Bilateral cataract extraction. Minimal mucosal thickening in the right maxillary sinus. Clear mastoid air cells. Other: None. IMPRESSION: No evidence of intracranial metastases. Electronically Signed   By: Sebastian Ache M.D.   On: 02/05/2023 13:14   CT CHEST W CONTRAST  Result Date: 02/01/2023 CLINICAL DATA:  Pulmonary nodules renal mass EXAM: CT CHEST WITH CONTRAST TECHNIQUE: Multidetector CT imaging of the chest was performed during intravenous contrast administration. RADIATION DOSE REDUCTION: This exam was performed according to the departmental dose-optimization program which includes automated exposure control, adjustment of the mA and/or kV according to patient  size and/or use of iterative reconstruction technique. CONTRAST:  50mL OMNIPAQUE IOHEXOL 350 MG/ML SOLN COMPARISON:  CT 02/01/2023, CT 05/26/2010 FINDINGS: Cardiovascular: Moderate aortic atherosclerosis. Mild aneurysmal dilatation of ascending aorta up to 4.1 cm. Borderline cardiomegaly. No pericardial effusion Mediastinum/Nodes: Midline trachea. 1.5 cm hypodense nodule right lobe of thyroid with calcification but present on 2012 comparison. Stability for greater than 5 years implies benignity; no biopsy or followup indicated (ref: J Am Coll Radiol. 2015 Feb;12(2): 143-50). No suspicious lymph nodes. Esophagus within normal limits. Lungs/Pleura: Lungs demonstrate no acute airspace disease or pleural effusion. Numerous bilateral pulmonary nodules. The largest lesions will be measured. Right  anterior upper lobe pulmonary nodule measuring 1.4 x 1.2 cm on series 4 image 45. Posterior right upper lobe pulmonary nodule measuring 14 x 10 mm on series 4, image 54. Left upper lobe posterior pulmonary nodule measuring 1.2 x 1.2 cm on series 4, image 76. Multiple left lower lobe pulmonary nodules including 10 x 9 mm nodule on series 4, image 89 and subpleural left lung base pulmonary nodule measuring 12 by 7 mm on series 4, image 105. Multiple additional bilateral pulmonary nodules are noted. Upper Abdomen: See separately dictated CT Musculoskeletal: No acute or suspicious osseous abnormality. IMPRESSION: 1. Numerous bilateral pulmonary nodules suspicious for metastatic disease. 2. Aortic atherosclerosis. Aortic Atherosclerosis (ICD10-I70.0). Electronically Signed   By: Jasmine Pang M.D.   On: 02/01/2023 22:03   CT Angio Abd/Pel W and/or Wo Contrast  Result Date: 02/01/2023 CLINICAL DATA:  Lower GI bleed EXAM: CTA ABDOMEN AND PELVIS WITHOUT AND WITH CONTRAST TECHNIQUE: Multidetector CT imaging of the abdomen and pelvis was performed using the standard protocol during bolus administration of intravenous contrast. Multiplanar reconstructed images and MIPs were obtained and reviewed to evaluate the vascular anatomy. RADIATION DOSE REDUCTION: This exam was performed according to the departmental dose-optimization program which includes automated exposure control, adjustment of the mA and/or kV according to patient size and/or use of iterative reconstruction technique. CONTRAST:  75mL OMNIPAQUE IOHEXOL 350 MG/ML SOLN COMPARISON:  06/03/2010 FINDINGS: VASCULAR Aorta: Normal caliber aorta without aneurysm, dissection, vasculitis or significant stenosis. Scattered calcifications. Celiac: Patent without evidence of aneurysm, dissection, vasculitis or significant stenosis. SMA: Patent without evidence of aneurysm, dissection, vasculitis or significant stenosis. Renals: Both renal arteries are patent without evidence  of aneurysm, dissection, vasculitis, fibromuscular dysplasia or significant stenosis. IMA: Patent without evidence of aneurysm, dissection, vasculitis or significant stenosis. Inflow: Patent without evidence of aneurysm, dissection, vasculitis or significant stenosis. Scattered calcifications. Proximal Outflow: Bilateral common femoral and visualized portions of the superficial and profunda femoral arteries are patent without evidence of aneurysm, dissection, vasculitis or significant stenosis. Veins: No obvious venous abnormality within the limitations of this arterial phase study. Review of the MIP images confirms the above findings. NON-VASCULAR Lower chest: 8 mm well-circumscribed rounded nodule in the left lower lobe, new since 20/12. Subpleural nodular area in the left lower lobe measures 11 mm on image 15. Left lower lobe nodule on image 19 measures 8 mm. Right middle lobe nodule on image 9 measures 4 mm. No effusions. Hepatobiliary: No focal hepatic abnormality. Multiple gallstones within the gallbladder. No biliary ductal dilatation. Pancreas: No focal abnormality or ductal dilatation. Spleen: No focal abnormality.  Normal size. Adrenals/Urinary Tract: Adrenal glands normal. Large mass off the mid to lower pole of the right kidney with areas of enhancement concerning for renal cell carcinoma. This is new since prior study. This measures 11.1 x 9.1 x 6.6 cm. No suspicious left renal  abnormality. No hydronephrosis. Urinary bladder unremarkable. Stomach/Bowel: Left colonic diverticulosis. No active diverticulitis. No active contrast extravasation to localize GI bleed. Stomach and small bowel decompressed, unremarkable. Lymphatic: No adenopathy. Reproductive: Prior hysterectomy.  No adnexal masses. Other: No free fluid or free air. Musculoskeletal: No acute bony abnormality. IMPRESSION: VASCULAR No contrast extravasation to localize GI bleed. No evidence of aortic aneurysm or dissection. NON-VASCULAR  Approximately 11 cm enhancing mass involving the mid to lower pole of the right kidney most compatible with renal cell carcinoma. Recommend urologic consultation. Cholelithiasis. Left colonic diverticulosis. Electronically Signed   By: Charlett Nose M.D.   On: 02/01/2023 19:12    Labs: BNP (last 3 results) No results for input(s): "BNP" in the last 8760 hours. Basic Metabolic Panel: Recent Labs  Lab 02/01/23 1109 02/01/23 1515 02/02/23 0941 02/03/23 0849  NA 136 136 135 138  K 4.0 3.4* 3.4* 3.7  CL 98 99 98 100  CO2 25 25 24  21*  GLUCOSE 115* 110* 145* 106*  BUN 12 11 9  7*  CREATININE 0.82 1.05* 0.84 0.83  CALCIUM 8.3* 8.3* 8.6* 8.5*  MG  --   --  1.9 1.8  PHOS  --   --  3.7  --    Liver Function Tests: Recent Labs  Lab 02/01/23 1109 02/01/23 1515  AST 15 19  ALT 15 20  ALKPHOS  --  80  BILITOT 0.4 0.7  PROT 6.5  6.4 7.1  ALBUMIN  --  1.9*   No results for input(s): "LIPASE", "AMYLASE" in the last 168 hours. No results for input(s): "AMMONIA" in the last 168 hours. CBC: Recent Labs  Lab 02/01/23 1515 02/01/23 2107 02/02/23 0941 02/04/23 0802 02/04/23 1838 02/05/23 0644 02/05/23 1702 02/06/23 0548  WBC 14.4* 13.8*  --   --   --   --   --   --   HGB 7.0* 6.7*   < > 8.2* 8.2* 7.6* 9.0* 8.2*  HCT 24.1* 23.0*   < > 27.5* 27.1* 24.7* 30.5* 26.6*  MCV 75.5* 74.2*  --   --   --   --   --   --   PLT 599* 565*  --   --   --   --   --   --    < > = values in this interval not displayed.   Anemia work up No results for input(s): "VITAMINB12", "FOLATE", "FERRITIN", "TIBC", "IRON", "RETICCTPCT" in the last 72 hours. Urinalysis    Component Value Date/Time   COLORURINE YELLOW 02/01/2023 2029   APPEARANCEUR CLEAR 02/01/2023 2029   LABSPEC >1.046 (H) 02/01/2023 2029   LABSPEC 1.020 02/23/2011 1047   PHURINE 5.0 02/01/2023 2029   GLUCOSEU NEGATIVE 02/01/2023 2029   HGBUR NEGATIVE 02/01/2023 2029   BILIRUBINUR NEGATIVE 02/01/2023 2029   BILIRUBINUR Negative 02/23/2011  1047   KETONESUR NEGATIVE 02/01/2023 2029   PROTEINUR NEGATIVE 02/01/2023 2029   UROBILINOGEN 0.2 06/01/2011 1313   NITRITE NEGATIVE 02/01/2023 2029   LEUKOCYTESUR NEGATIVE 02/01/2023 2029   LEUKOCYTESUR Trace 02/23/2011 1047   Sepsis Labs Recent Labs  Lab 02/01/23 1515 02/01/23 2107  WBC 14.4* 13.8*   Microbiology No results found for this or any previous visit (from the past 240 hour(s)).  Time coordinating discharge: 25 minutes  SIGNED: Lanae Boast, MD  [facility] 02/06/2023, 11:43 AM  If 7PM-7AM, please contact night-coverage [email or web address]

## 2023-02-07 ENCOUNTER — Telehealth: Payer: Self-pay

## 2023-02-07 NOTE — Transitions of Care (Post Inpatient/ED Visit) (Signed)
02/07/2023  Name: Diane Paul MRN: [id number] DOB: 08-05-1940  Today's TOC FU Call Status: Today's TOC FU Call Status:: Unsuccessful Call (1st Attempt) Unsuccessful Call (1st Attempt) Date: 02/07/23  Attempted to reach the patient regarding the most recent Inpatient/ED visit.  Follow Up Plan: Additional outreach attempts will be made to reach the patient to complete the Transitions of Care (Post Inpatient/ED visit) call.   Signature Karena Addison, LPN [facility] Nurse Health Advisor Direct Dial [phone]

## 2023-02-11 ENCOUNTER — Other Ambulatory Visit: Payer: Self-pay

## 2023-02-11 ENCOUNTER — Encounter (HOSPITAL_COMMUNITY): Payer: Self-pay | Admitting: Emergency Medicine

## 2023-02-11 NOTE — Progress Notes (Addendum)
I have not been able to reach Ms. Helia Haese, I called her daughter tyrika, newman said she will call the sister that lives with Ms Conerly and have her call me when he is at home with Ms Brendan Gruwell. Mandy Peeks called me back, Ms Donasia Wimes called me back, I was able to speak with Ms Susane Bey.  Ms Araiya Tilmon denies chest pain or shortness of breath or chest pain. Patient denies having any s/s of Covid in her household, also denies any known exposure to Covid. Ms Zoejane Gaulin reports that she has a runny nose, that happens when seasons change.  Ms Cniyah Sproull states that she has been coughing some, since she was released from the hospital.  Patient states that she is coughing up thick, white, phlegm, "every now and then," denies fever or shortness of breath with the cough.  I spoke with Dr. Alisa Graff, he said she may come in 2.5 hours early, does not need to do Covid test. Ms Avalynne Diver PCP is Dr. Lynnea Ferrier.

## 2023-02-12 ENCOUNTER — Ambulatory Visit (HOSPITAL_BASED_OUTPATIENT_CLINIC_OR_DEPARTMENT_OTHER): Payer: Medicare HMO | Admitting: Anesthesiology

## 2023-02-12 ENCOUNTER — Encounter (HOSPITAL_COMMUNITY): Payer: Self-pay | Admitting: Emergency Medicine

## 2023-02-12 ENCOUNTER — Ambulatory Visit (HOSPITAL_COMMUNITY)
Admission: RE | Admit: 2023-02-12 | Discharge: 2023-02-12 | Disposition: A | Discharge status: Home / Self Care | Payer: Medicare HMO | Source: Ambulatory Visit | Attending: Emergency Medicine | Admitting: Emergency Medicine

## 2023-02-12 ENCOUNTER — Ambulatory Visit (HOSPITAL_COMMUNITY): Payer: Medicare HMO

## 2023-02-12 ENCOUNTER — Ambulatory Visit (HOSPITAL_COMMUNITY): Payer: Medicare HMO | Admitting: Anesthesiology

## 2023-02-12 ENCOUNTER — Encounter (HOSPITAL_COMMUNITY)
Admission: RE | Disposition: A | Discharge status: Home / Self Care | Payer: Self-pay | Source: Ambulatory Visit | Attending: Emergency Medicine

## 2023-02-12 ENCOUNTER — Other Ambulatory Visit: Payer: Self-pay

## 2023-02-12 DIAGNOSIS — N2889 Other specified disorders of kidney and ureter: Secondary | ICD-10-CM | POA: Insufficient documentation

## 2023-02-12 DIAGNOSIS — R918 Other nonspecific abnormal finding of lung field: Secondary | ICD-10-CM

## 2023-02-12 DIAGNOSIS — K219 Gastro-esophageal reflux disease without esophagitis: Secondary | ICD-10-CM | POA: Diagnosis not present

## 2023-02-12 DIAGNOSIS — M1990 Unspecified osteoarthritis, unspecified site: Secondary | ICD-10-CM | POA: Insufficient documentation

## 2023-02-12 DIAGNOSIS — Z853 Personal history of malignant neoplasm of breast: Secondary | ICD-10-CM | POA: Insufficient documentation

## 2023-02-12 DIAGNOSIS — Z803 Family history of malignant neoplasm of breast: Secondary | ICD-10-CM | POA: Diagnosis not present

## 2023-02-12 DIAGNOSIS — R948 Abnormal results of function studies of other organs and systems: Secondary | ICD-10-CM | POA: Diagnosis not present

## 2023-02-12 DIAGNOSIS — F419 Anxiety disorder, unspecified: Secondary | ICD-10-CM | POA: Diagnosis not present

## 2023-02-12 DIAGNOSIS — C799 Secondary malignant neoplasm of unspecified site: Secondary | ICD-10-CM | POA: Diagnosis present

## 2023-02-12 DIAGNOSIS — Z48813 Encounter for surgical aftercare following surgery on the respiratory system: Secondary | ICD-10-CM | POA: Diagnosis not present

## 2023-02-12 DIAGNOSIS — I10 Essential (primary) hypertension: Secondary | ICD-10-CM | POA: Diagnosis not present

## 2023-02-12 DIAGNOSIS — D649 Anemia, unspecified: Secondary | ICD-10-CM | POA: Diagnosis not present

## 2023-02-12 DIAGNOSIS — C7801 Secondary malignant neoplasm of right lung: Secondary | ICD-10-CM | POA: Diagnosis present

## 2023-02-12 HISTORY — PX: BRONCHIAL WASHINGS: SHX5105

## 2023-02-12 HISTORY — PX: BRONCHIAL NEEDLE ASPIRATION BIOPSY: SHX5106

## 2023-02-12 HISTORY — PX: BRONCHIAL BIOPSY: SHX5109

## 2023-02-12 HISTORY — PX: BRONCHIAL BRUSHINGS: SHX5108

## 2023-02-12 HISTORY — DX: Gastro-esophageal reflux disease without esophagitis: K21.9

## 2023-02-12 SURGERY — BRONCHOSCOPY, WITH BIOPSY USING ELECTROMAGNETIC NAVIGATION
Anesthesia: General

## 2023-02-12 MED ORDER — DEXMEDETOMIDINE HCL IN NACL 80 MCG/20ML IV SOLN
INTRAVENOUS | Status: DC | PRN
Start: 2023-02-12 — End: 2023-02-12
  Administered 2023-02-12 (×3): 4 ug via INTRAVENOUS

## 2023-02-12 MED ORDER — PROPOFOL 10 MG/ML IV BOLUS
INTRAVENOUS | Status: DC | PRN
  Administered 2023-02-12: 130 mg via INTRAVENOUS
  Administered 2023-02-12: 30 mg via INTRAVENOUS

## 2023-02-12 MED ORDER — DROPERIDOL 2.5 MG/ML IJ SOLN: 0.6250 mg | Freq: Once | INTRAMUSCULAR | Status: DC | PRN

## 2023-02-12 MED ORDER — ROCURONIUM BROMIDE 10 MG/ML (PF) SYRINGE
PREFILLED_SYRINGE | INTRAVENOUS | Status: DC | PRN
  Administered 2023-02-12: 60 mg via INTRAVENOUS

## 2023-02-12 MED ORDER — FENTANYL CITRATE (PF) 100 MCG/2ML IJ SOLN: 25.0000 ug | INTRAMUSCULAR | Status: DC | PRN

## 2023-02-12 MED ORDER — SODIUM CHLORIDE 0.9 % IV SOLN: INTRAVENOUS | Status: DC

## 2023-02-12 MED ORDER — DEXAMETHASONE SODIUM PHOSPHATE 10 MG/ML IJ SOLN
INTRAMUSCULAR | Status: DC | PRN
  Administered 2023-02-12: 5 mg via INTRAVENOUS

## 2023-02-12 MED ORDER — LACTATED RINGERS IV SOLN
INTRAVENOUS | Status: DC | PRN
Start: 2023-02-12 — End: 2023-02-12

## 2023-02-12 MED ORDER — SODIUM CHLORIDE 0.9% FLUSH: 10.0000 mL | Freq: Two times a day (BID) | INTRAVENOUS | Status: DC

## 2023-02-12 MED ORDER — SUGAMMADEX SODIUM 200 MG/2ML IV SOLN
INTRAVENOUS | Status: DC | PRN
  Administered 2023-02-12: 150 mg via INTRAVENOUS

## 2023-02-12 MED ORDER — LIDOCAINE 2% (20 MG/ML) 5 ML SYRINGE
INTRAMUSCULAR | Status: DC | PRN
  Administered 2023-02-12 (×2): 100 mg via INTRAVENOUS

## 2023-02-12 MED ORDER — ONDANSETRON HCL 4 MG/2ML IJ SOLN
INTRAMUSCULAR | Status: DC | PRN
  Administered 2023-02-12: 4 mg via INTRAVENOUS

## 2023-02-12 MED ORDER — CHLORHEXIDINE GLUCONATE 0.12 % MT SOLN
OROMUCOSAL | Status: AC
  Administered 2023-02-12: 15 mL via OROMUCOSAL
  Filled 2023-02-12: qty 15

## 2023-02-12 MED ORDER — PHENYLEPHRINE HCL-NACL 20-0.9 MG/250ML-% IV SOLN
INTRAVENOUS | Status: DC | PRN
  Administered 2023-02-12: 100 ug/min via INTRAVENOUS

## 2023-02-12 MED ORDER — CHLORHEXIDINE GLUCONATE 0.12 % MT SOLN: 15.0000 mL | Freq: Once | OROMUCOSAL | Status: AC

## 2023-02-12 NOTE — Transfer of Care (Signed)
Immediate Anesthesia Transfer of Care Note  Patient: Diane Paul  Procedure(s) Performed: ROBOTIC ASSISTED NAVIGATIONAL BRONCHOSCOPY BRONCHIAL BIOPSIES BRONCHIAL BRUSHINGS BRONCHIAL NEEDLE ASPIRATION BIOPSIES BRONCHIAL WASHINGS  Patient Location: PACU  Anesthesia Type:General  Level of Consciousness: awake, alert , and oriented  Airway & Oxygen Therapy: Patient Spontanous Breathing  Post-op Assessment: Report given to RN  Post vital signs: Reviewed and stable  Last Vitals:  Vitals Value Taken Time  BP 114/67 02/12/23 1526  Temp    Pulse 95 02/12/23 1527  Resp 30 02/12/23 1527  SpO2 97 % 02/12/23 1527  Vitals shown include unfiled device data.  Last Pain:  Vitals:   02/12/23 1526  TempSrc: Temporal  PainSc: Asleep         Complications: No notable events documented.

## 2023-02-12 NOTE — Anesthesia Preprocedure Evaluation (Addendum)
Anesthesia Evaluation  Patient identified by MRN, date of birth, ID band Patient awake    Reviewed: Allergy & Precautions, NPO status , Patient's Chart, lab work & pertinent test results  History of Anesthesia Complications Negative for: history of anesthetic complications  Airway Mallampati: I  TM Distance: >3 FB Neck ROM: Full    Dental  (+) Edentulous Lower, Edentulous Upper   Pulmonary neg pulmonary ROS   Pulmonary exam normal        Cardiovascular hypertension, Normal cardiovascular exam     Neuro/Psych   Anxiety        GI/Hepatic Neg liver ROS,GERD  ,,  Endo/Other  negative endocrine ROS    Renal/GU      Musculoskeletal  (+) Arthritis ,    Abdominal   Peds  Hematology  (+) Blood dyscrasia (Hgb 8.2, INR 1.6), anemia   Anesthesia Other Findings Day of surgery medications reviewed with patient.  Reproductive/Obstetrics                              Anesthesia Physical Anesthesia Plan  ASA: 3  Anesthesia Plan: General   Post-op Pain Management: Minimal or no pain anticipated   Induction: Intravenous  PONV Risk Score and Plan: 3 and Treatment may vary due to age or medical condition, Ondansetron and Dexamethasone  Airway Management Planned: Oral ETT  Additional Equipment: None  Intra-op Plan:   Post-operative Plan: Extubation in OR  Informed Consent: I have reviewed the patients History and Physical, chart, labs and discussed the procedure including the risks, benefits and alternatives for the proposed anesthesia with the patient or authorized representative who has indicated his/her understanding and acceptance.     Dental advisory given  Plan Discussed with: CRNA  Anesthesia Plan Comments:         Anesthesia Quick Evaluation

## 2023-02-12 NOTE — Interval H&P Note (Signed)
History and Physical Interval Note:  02/12/2023 11:07 AM  Diane Paul  has presented today for surgery, with the diagnosis of lung nodules.  The various methods of treatment have been discussed with the patient and family. After consideration of risks, benefits and other options for treatment, the patient has consented to  Procedure(s): ROBOTIC ASSISTED NAVIGATIONAL BRONCHOSCOPY (N/A) as a surgical intervention.  The patient's history has been reviewed, patient examined, no change in status, stable for surgery.  I have reviewed the patient's chart and labs.  Questions were answered to the patient's satisfaction.     Leslye Peer

## 2023-02-12 NOTE — Discharge Instructions (Addendum)
Flexible Bronchoscopy, Care After This sheet gives you information about how to care for yourself after your test. Your doctor may also give you more specific instructions. If you have problems or questions, contact your doctor. Follow these instructions at home: Eating and drinking When your numbness is gone and your cough and gag reflexes have come back, you may: Eat only soft foods. Slowly drink liquids. The day after the test, go back to your normal diet. Driving Do not drive for 24 hours if you were given a medicine to help you relax (sedative). Do not drive or use heavy machinery while taking prescription pain medicine. General instructions  Take over-the-counter and prescription medicines only as told by your doctor. Return to your normal activities as told. Ask what activities are safe for you. Do not use any products that have nicotine or tobacco in them. This includes cigarettes and e-cigarettes. If you need help quitting, ask your doctor. Keep all follow-up visits as told by your doctor. This is important. It is very important if you had a tissue sample (biopsy) taken. Get help right away if: You have shortness of breath that gets worse. You get light-headed. You feel like you are going to pass out (faint). You have chest pain. You cough up: More than a little blood. More blood than before. Summary Do not eat or drink anything (not even water) for 2 hours after your test, or until your numbing medicine wears off. Do not use cigarettes. Do not use e-cigarettes. Get help right away if you have chest pain.  Please call our office for any questions or concerns.  [phone].  This information is not intended to replace advice given to you by your health care provider. Make sure you discuss any questions you have with your health care provider. Document Released: 02/18/2009 Document Revised: 04/05/2017 Document Reviewed: 05/11/2016 Elsevier Patient Education  2020 Elsevier  Inc.  

## 2023-02-12 NOTE — Anesthesia Procedure Notes (Signed)
Procedure Name: Intubation Date/Time: 02/12/2023 1:46 PM  Performed by: Susy Manor, CRNAPre-anesthesia Checklist: Patient identified, Emergency Drugs available, Suction available and Patient being monitored Patient Re-evaluated:Patient Re-evaluated prior to induction Oxygen Delivery Method: Circle System Utilized Preoxygenation: Pre-oxygenation with 100% oxygen Induction Type: IV induction Ventilation: Mask ventilation without difficulty Laryngoscope Size: Mac and 4 Grade View: Grade II Tube type: Oral Tube size: 8.5 mm Number of attempts: 1 Airway Equipment and Method: Stylet and Oral airway Placement Confirmation: ETT inserted through vocal cords under direct vision, positive ETCO2 and breath sounds checked- equal and bilateral Secured at: 22 cm Tube secured with: Tape Dental Injury: Teeth and Oropharynx as per pre-operative assessment

## 2023-02-12 NOTE — Op Note (Signed)
Video Bronchoscopy with Robotic Assisted Bronchoscopic Navigation   Date of Operation: 02/12/2023   Pre-op Diagnosis: Multiple bilateral pulmonary nodules  Post-op Diagnosis: Same  Surgeon: Levy Pupa  Assistants: None  Anesthesia: General endotracheal anesthesia  Operation: Flexible video fiberoptic bronchoscopy with robotic assistance and biopsies.  Estimated Blood Loss: Minimal  Complications: None  Indications and History: Diane Paul is a 82 y.o. female with history of breast cancer, hypertension, iron deficiency anemia, newly identified right renal mass.  A CT chest showed bilateral scattered pulmonary nodules concerning for possible metastatic disease.  Recommendation made to achieve a tissue diagnosis via robotic assisted navigational bronchoscopy. The risks, benefits, complications, treatment options and expected outcomes were discussed with the patient.  The possibilities of pneumothorax, pneumonia, reaction to medication, pulmonary aspiration, perforation of a viscus, bleeding, failure to diagnose a condition and creating a complication requiring transfusion or operation were discussed with the patient who freely signed the consent.    Description of Procedure: The patient was seen in the Preoperative Area, was examined and was deemed appropriate to proceed.  The patient was taken to [facility] endoscopy room 3, identified as Diane Paul and the procedure verified as Flexible Video Fiberoptic Bronchoscopy.  A Time Out was held and the above information confirmed.   Prior to the date of the procedure a high-resolution CT scan of the chest was performed. Utilizing ION software program a virtual tracheobronchial tree was generated to allow the creation of distinct navigation pathways to the patient's parenchymal abnormalities. After being taken to the operating room general anesthesia was initiated and the patient  was orally intubated. The video fiberoptic bronchoscope was  introduced via the endotracheal tube and a general inspection was performed which showed normal right left lung anatomy.  There was a single area of mucosal abnormality in the left lower lobe segmental airways.  This was brushed and forceps biopsy and sent for cytology.  Aspiration of the bilateral mainstems was completed to remove any remaining secretions. Robotic catheter inserted into patient's endotracheal tube.   Target #1 right upper lobe pulmonary nodule: The distinct navigation pathways prepared prior to this procedure were then utilized to navigate to patient's lesion identified on CT scan. The robotic catheter was secured into place and the vision probe was withdrawn.  Lesion location was approximated using fluoroscopy.  Local registration and targeting was performed using Cios three-dimensional imaging. Under fluoroscopic guidance transbronchial needle brushings, transbronchial needle biopsies, and transbronchial forceps biopsies were performed to be sent for cytology and pathology. A bronchioalveolar lavage was performed in the right upper lobe adjacent to the nodule and sent for microbiology.  Target #2 left upper lobe pulmonary nodule: The distinct navigation pathways prepared prior to this procedure were then utilized to navigate to patient's lesion identified on CT scan. The robotic catheter was secured into place and the vision probe was withdrawn.  Lesion location was approximated using fluoroscopy.  Local registration and targeting was performed using Cios three-dimensional imaging. Under fluoroscopic guidance transbronchial needle brushings, transbronchial needle biopsies, and transbronchial forceps biopsies were performed to be sent for cytology and pathology.   At the end of the procedure a general airway inspection was performed and there was no evidence of active bleeding. The bronchoscope was removed.  The patient tolerated the procedure well. There was no significant blood loss and  there were no obvious complications. A post-procedural chest x-ray is pending.  Samples Target #1: 1. Transbronchial needle brushings from right upper lobe nodule 2. Transbronchial Regino Schultze  needle biopsies from right upper lobe nodule 3. Transbronchial forceps biopsies from right upper lobe nodule 4. Bronchoalveolar lavage from right upper lobe  Endobronchial samples: 1. Endobronchial biopsies from left lower lobe airway 2.  Endobronchial brushings from the left lower lobe airway  Samples Target #2: 1. Transbronchial needle brushings from left upper lobe nodule 2. Transbronchial Wang needle biopsies from left upper lobe nodule 3. Transbronchial forceps biopsies from left upper lobe nodule   Plans:  The patient will be discharged from the PACU to home when recovered from anesthesia and after chest x-ray is reviewed. We will review the cytology, pathology and microbiology results with the patient when they become available. Outpatient followup will be with Dr. Delton Coombes.    Levy Pupa, MD, PhD 02/12/2023, 3:19 PM [facility] [phone] or if no answer before 7:00PM call [phone] For any issues after 7:00PM please call eLink [phone]

## 2023-02-12 NOTE — Transitions of Care (Post Inpatient/ED Visit) (Signed)
02/12/2023  Name: Diane Paul MRN: [id number] DOB: [birth year]  Today's TOC FU Call Status: Today's TOC FU Call Status:: Successful TOC FU Call Completed Unsuccessful Call (1st Attempt) Date: 02/07/23 [facility] FU Call Complete Date: 02/12/23 Patient's Name and Date of Birth confirmed.  Transition Care Management Follow-up Telephone Call Date of Discharge: 02/06/23 Discharge Facility: [facility]) Type of Discharge: Inpatient Admission Primary Inpatient Discharge Diagnosis:: anemia How have you been since you were released from the hospital?: Better Any questions or concerns?: No  Items Reviewed: Did you receive and understand the discharge instructions provided?: Yes Medications obtained,verified, and reconciled?: Yes (Medications Reviewed) Any new allergies since your discharge?: No Dietary orders reviewed?: Yes Do you have support at home?: Yes People in Home: child(ren), adult  Medications Reviewed Today: Medications Reviewed Today     Reviewed by Karena Addison, LPN (Licensed Practical Nurse) on 02/12/23 at 1514  Med List Status: <None>   Medication Order Taking? Sig Documenting Provider Last Dose Status Informant  B Complex-C-Folic Acid (MULTIVITAMIN, STRESS FORMULA) tablet [id number] No Take 1 tablet by mouth daily. gummie bear MVT [provider] 02/01/2023 Active Self, Pharmacy Records  Calcium Carbonate-Vit D-Min (CALTRATE PLUS PO) [id number] No Take by mouth. 1200MG  OF CALCIUM & 800IU OF VITAMIN D [provider] 02/01/2023 Active Self, Pharmacy Records  ezetimibe (ZETIA) 10 MG tablet [id number] No Take 1 tablet (10 mg total) by mouth daily.  Patient not taking: Reported on 02/02/2023   Donita Brooks, MD Not Taking Active Self, Pharmacy Records  feeding supplement (ENSURE ENLIVE / ENSURE PLUS) LIQD [id number] No Take 237 mLs by mouth 2 (two) times daily between meals for 7 days. Lanae Boast, MD 02/11/2023 Active   furosemide (LASIX) 40 MG tablet  [id number] No Take 1 tablet (40 mg total) by mouth daily as needed. Donita Brooks, MD Unknown Active Self, Pharmacy Records  Iron, Ferrous Sulfate, 325 (65 Fe) MG TABS [id number] No Take 325 mg by mouth daily. Donita Brooks, MD 02/11/2023 Active Self, Pharmacy Records  pantoprazole (PROTONIX) 40 MG tablet [id number] No Take 1 tablet (40 mg total) by mouth 2 (two) times daily. Lanae Boast, MD 02/11/2023 Active             Home Care and Equipment/Supplies: Were Home Health Services Ordered?: NA Any new equipment or medical supplies ordered?: NA  Functional Questionnaire: Do you need assistance with bathing/showering or dressing?: No Do you need assistance with meal preparation?: No Do you need assistance with eating?: No Do you have difficulty maintaining continence: No Do you need assistance with getting out of bed/getting out of a chair/moving?: No Do you have difficulty managing or taking your medications?: No  Follow up appointments reviewed: PCP Follow-up appointment confirmed?: Yes Date of PCP follow-up appointment?: 02/06/23 Follow-up Provider: anemia Specialist Hospital Follow-up appointment confirmed?: Yes Date of Specialist follow-up appointment?: 02/12/23 Follow-Up Specialty Provider:: surgeon Do you need transportation to your follow-up appointment?: No Do you understand care options if your condition(s) worsen?: Yes-patient verbalized understanding    SIGNATURE Karena Addison, LPN [facility] Nurse Health Advisor Direct Dial [phone]

## 2023-02-13 LAB — CYTOLOGY - NON PAP

## 2023-02-14 ENCOUNTER — Telehealth: Payer: Self-pay | Admitting: Emergency Medicine

## 2023-02-14 LAB — CULTURE, BAL-QUANTITATIVE W GRAM STAIN: Gram Stain: NONE SEEN

## 2023-02-14 LAB — CYTOLOGY - NON PAP

## 2023-02-14 LAB — ACID FAST SMEAR (AFB, MYCOBACTERIA): Acid Fast Smear: NEGATIVE

## 2023-02-14 NOTE — Telephone Encounter (Signed)
Call to review bronchoscopy resuults

## 2023-02-15 ENCOUNTER — Ambulatory Visit: Payer: Medicare HMO | Admitting: Family Medicine

## 2023-02-15 ENCOUNTER — Encounter (HOSPITAL_COMMUNITY): Payer: Self-pay | Admitting: Emergency Medicine

## 2023-02-15 VITALS — BP 122/78 | HR 107 | Temp 98.3°F | Ht 68.0 in | Wt 197.0 lb

## 2023-02-15 DIAGNOSIS — R918 Other nonspecific abnormal finding of lung field: Secondary | ICD-10-CM

## 2023-02-15 DIAGNOSIS — N2889 Other specified disorders of kidney and ureter: Secondary | ICD-10-CM

## 2023-02-15 DIAGNOSIS — R634 Abnormal weight loss: Secondary | ICD-10-CM

## 2023-02-15 DIAGNOSIS — D509 Iron deficiency anemia, unspecified: Secondary | ICD-10-CM

## 2023-02-15 DIAGNOSIS — D62 Acute posthemorrhagic anemia: Secondary | ICD-10-CM

## 2023-02-15 NOTE — Anesthesia Postprocedure Evaluation (Signed)
Anesthesia Post Note  Patient: Diane Paul  Procedure(s) Performed: ROBOTIC ASSISTED NAVIGATIONAL BRONCHOSCOPY BRONCHIAL BIOPSIES BRONCHIAL BRUSHINGS BRONCHIAL NEEDLE ASPIRATION BIOPSIES BRONCHIAL WASHINGS     Patient location during evaluation: PACU Anesthesia Type: General Level of consciousness: awake and alert Pain management: pain level controlled Vital Signs Assessment: post-procedure vital signs reviewed and stable Respiratory status: spontaneous breathing, nonlabored ventilation, respiratory function stable and patient connected to nasal cannula oxygen Cardiovascular status: blood pressure returned to baseline and stable Postop Assessment: no apparent nausea or vomiting Anesthetic complications: no   No notable events documented.  Last Vitals:  Vitals:   02/12/23 1600 02/12/23 1610  BP: 100/65 99/61  Pulse: 88 93  Resp: (!) 26 (!) 28  Temp:    SpO2: 94% 93%    Last Pain:  Vitals:   02/12/23 1610  TempSrc:   PainSc: 0-No pain                 Mariann Barter

## 2023-02-15 NOTE — Telephone Encounter (Signed)
Called to review bronchoscopy results with the patient.  I reached her and her daughters by phone.  They were also able to discuss with Dr. Tanya Nones earlier today at their office visit  Her transbronchial biopsies showed acute inflammatory cells but no evidence of cancer.  Unclear to me whether this could reflect a false negative based on suspicion and in the setting of presumed renal cell carcinoma.  I agree with a PET scan which has been planned by Dr. Tanya Nones.  I think it would be reasonable to also obtain an echocardiogram to ensure no evidence of endocarditis which could cause embolic lesions in the lungs that would show acute inflammation on transbronchial biopsy.  Will continue to coordinate with Dr. Tanya Nones and Dr. Cherly Hensen.  If we believe that it would be helpful to repeat her bronchoscopy to ensure no evidence of malignancy then I will help arrange this.

## 2023-02-15 NOTE — Addendum Note (Signed)
Addended by: Venia Carbon K on: 02/15/2023 11:49 AM   Modules accepted: Orders

## 2023-02-15 NOTE — Progress Notes (Signed)
Subjective:    Patient ID: Diane Paul, female    DOB: May 25, 1940, 82 y.o.   MRN: [id number] Admit date: 02/01/2023 Discharge date: 02/06/2023 Recommendations for Outpatient Follow-up:  Follow up with PCP in 1 weeks-call for appointment FU w/ Dr Delton Coombes For Bronchoscopy on oct 8 Please obtain BMP/CBC in one week    Brief/Interim Summary: 35 yof w/ right breast cancer s/p mastectomy, HTN, arthritis, iron deficiency anemia who was referred to the hospital after worsening anemia on outpatient labs.She had recently undergone evaluation with primary care on 02/01/2023 due to worsening fatigue.  Anemic workup was initiated.She had denied any abdominal pain or change in stools notably negative for melena. Hemoglobin was noted to be 7 g/dL and down trended to 6.7 g/dL.She was transfused 1 unit PRBC 9/28  with improvement in hemoglobin. CT angio abdomen/pelvis was performed initially looking for possible bleeding etiology.  This was negative for evidence of GI bleed but showed underlying 11 cm right kidney mass concerning for RCC.  This was then followed by CT chest which showed numerous bilateral pulmonary nodules concerning for metastatic lesions.  She was admitted for GI and urology evaluation underwent EGD and colonoscopy on 02/03/2023. Planning for outpatient urology follow-up to discuss nephrectomy and Metopirone planning for bronchoscopy coming Friday She was monitored overnight> hemoglobin trending as  6.7> 9.2> 8.2> 8.2> 7.6> 9.0> 8.2 g  Overall doing well. Saw her with daughter at bedside, agreeable with plan for home today and return friday for outpatient bronchoscopy.she has been ambulating without assistance, she has Bronchoscopy planned for Friday as outpatient.      Discharge Diagnoses:  Principal Problem:   Microcytic anemia Active Problems:   Renal cell carcinoma (HCC)   Weight loss   Thrombocytosis   Symptomatic anemia   Malignant neoplasm metastatic to both lungs (HCC)    Microcytic anemia Iron deficiency anemia She was admitted for GI and urology evaluation underwent EGD and colonoscopy on 02/03/2023. Planning for outpatient urology follow-up to discuss nephrectomy and Metopirone planning for bronchoscopy coming Friday She was monitored overnight> hemoglobin trending as  6.7> 9.2> 8.2> 8.2> 7.6> 9.0> 8.2 g  Overall doing well. Saw her with daughter at bedside, agreeable with plan for home today and return friday for outpatient bronchoscopy.she has been ambulating without assistance, she has Bronchoscopy planned for Friday as outpatient   -EGD revealed erosive gastropathy without stigmata of recent bleeding and underlying gastritis -Colonoscopy showed 2 polyps which were removed -Follow-up H. pylori testing; if positive quadruple therapy recommended per GI; if negative, capsule endoscopy recommended -Iron stores technically normal with slightly increased iron sat ratio, again possibly in setting of underlying RCC Fu with Dr Elnoria Howard for [facility] and surgical pathology Last Labs         Recent Labs  Lab 02/04/23 0802 02/04/23 1838 02/05/23 0644 02/05/23 1702 02/06/23 0548  HGB 8.2* 8.2* 7.6* 9.0* 8.2*  HCT 27.5* 27.1* 24.7* 30.5* 26.6*       Right renal cell carcinoma Multiple pulmonary nodules CTA with incidental finding of right renal mass 11.1 x 9.1 x 6.6 cm concerning for RCC.  Patient and daughters aware of findings including lung nodules Outpatient follow-up with urology planned for nephrectomy- provided w/ [facility] no- also called the urology office for an appointment and left details. Oncology aware, she will follow-up with Dr. Cherly Hensen Pulmonology consulted for bronchoscopy which is planned for 02/12/23.   Thrombocytosis Suspect reactive in setting of IDA versus paraneoplastic   Leukocytosis Suspect related to  RCC Low-grade fever could be in the setting of RCC no other cough or urinary symptoms advised to watch for any kind of fever  episodes for cough abdominal pain or urinary symptoms and notify MD or come to the ED   Weight loss  Review of weight trend shows 20 pound weight loss since 6/24.  Albumin is 1.9.  Nutrition Problem: Increased nutrient needs Etiology: chronic illness Signs/Symptoms: estimated needs Interventions: Ensure Enlive (each supplement provides 350kcal and 20 grams of protein)   Hypokalemia -Repleted   HTN-  Recently bp meds been on hold by her pcp   Chronic medical problems: Breast cancer with history of mastectomy: Outpatient oncology follow-up Hypertension: No longer on antihypertensives  02/15/23  Patient is here today with her daughter.  Patient had bronchoscopy on October 8.  The pathology report from the left lung revealed no malignant cells.  Preliminary report from the right lung revealed atypical cells but was nondiagnostic.  They have not spoken to pulmonary yet regarding the biopsy results.  They have also not had an appointment yet to see a urologist.  She continues to feel weak and tired.  She also reports postnasal drip, sinus drainage, and thick phlegm she has to cough up.  She is also dealing with constipation. Past Medical History:  Diagnosis Date   Anemia    Anxiety    nervous sometimes, denies panica ttacks    Arthritis    knees ?   Blood transfusion    post vaginal birth- 1960, 01/2023- anemia   Breast cancer (HCC)    Right Breast Cancer   Cancer (HCC)    R breast cancer   Dry mouth    GERD (gastroesophageal reflux disease)    Hypertension    Neuromuscular disorder (HCC)    L leg, thigh- "burns sometimes"   Shortness of breath    sometimes    Use of letrozole (Femara) 05/07/2010   neoadjuvant femara therapy since 1/212   Past Surgical History:  Procedure Laterality Date   ABDOMINAL HYSTERECTOMY     BIOPSY  02/03/2023   Procedure: BIOPSY;  Surgeon: Jeani Hawking, MD;  Location: [facility] ENDOSCOPY;  Service: Gastroenterology;;  gastric   BREAST LUMPECTOMY      BREAST SURGERY     Right   BRONCHIAL BIOPSY  02/12/2023   Procedure: BRONCHIAL BIOPSIES;  Surgeon: Leslye Peer, MD;  Location: [facility] ENDOSCOPY;  Service: Pulmonary;;   BRONCHIAL BRUSHINGS  02/12/2023   Procedure: BRONCHIAL BRUSHINGS;  Surgeon: Leslye Peer, MD;  Location: [facility] ENDOSCOPY;  Service: Pulmonary;;   BRONCHIAL NEEDLE ASPIRATION BIOPSY  02/12/2023   Procedure: BRONCHIAL NEEDLE ASPIRATION BIOPSIES;  Surgeon: Leslye Peer, MD;  Location: [facility] ENDOSCOPY;  Service: Pulmonary;;   BRONCHIAL WASHINGS  02/12/2023   Procedure: BRONCHIAL WASHINGS;  Surgeon: Leslye Peer, MD;  Location: [facility] ENDOSCOPY;  Service: Pulmonary;;   COLONOSCOPY WITH PROPOFOL N/A 02/03/2023   Procedure: COLONOSCOPY WITH PROPOFOL;  Surgeon: Jeani Hawking, MD;  Location: [facility] ENDOSCOPY;  Service: Gastroenterology;  Laterality: N/A;   ESOPHAGOGASTRODUODENOSCOPY (EGD) WITH PROPOFOL N/A 02/03/2023   Procedure: ESOPHAGOGASTRODUODENOSCOPY (EGD) WITH PROPOFOL;  Surgeon: Jeani Hawking, MD;  Location: [facility] ENDOSCOPY;  Service: Gastroenterology;  Laterality: N/A;   HEMOSTASIS CLIP PLACEMENT  02/03/2023   Procedure: HEMOSTASIS CLIP PLACEMENT;  Surgeon: Jeani Hawking, MD;  Location: [facility] ENDOSCOPY;  Service: Gastroenterology;;  ascending colon polyp   jp drains     s/p masectomy 06/11/11/ Dr. [name], 2 jp drains right chest wall   MASTECTOMY Right  malignant   MASTECTOMY W/ SENTINEL NODE BIOPSY  06/11/2011/Right BReast   Procedure: MASTECTOMY WITH SENTINEL LYMPH NODE BIOPSY;  Surgeon: Ernestene Mention, MD;  Location: [facility] OR;  Service: General;  Laterality: Right;  Right total mastectomy and sentinel lymph node biopsy using lymphatic mapping and blue dye injection.   POLYPECTOMY  02/03/2023   Procedure: POLYPECTOMY;  Surgeon: Jeani Hawking, MD;  Location: [facility] ENDOSCOPY;  Service: Gastroenterology;;  hot snare polypectomy ascending colon   SUBMUCOSAL LIFTING INJECTION  02/03/2023   Procedure: SUBMUCOSAL LIFTING INJECTION;  Surgeon: Jeani Hawking, MD;  Location: [facility] ENDOSCOPY;  Service: Gastroenterology;;  ever lift 16cc ascending colon   TUBAL LIGATION     [place] FINE NEEDLE ASPIRATION WO/ W SMEAR  05/22/10   Left Breast- cyst or abscess    Current Outpatient Medications on File Prior to Visit  Medication Sig Dispense Refill   Iron, Ferrous Sulfate, 325 (65 Fe) MG TABS Take 325 mg by mouth daily. 30 tablet 0   pantoprazole (PROTONIX) 40 MG tablet Take 1 tablet (40 mg total) by mouth 2 (two) times daily. 60 tablet 0   No current facility-administered medications on file prior to visit.   Allergies  Allergen Reactions   Fosamax [Alendronate Sodium] Other (See Comments)    Aches and pains   Social History   Socioeconomic History   Marital status: Single    Spouse name: Not on file   Number of children: 5   Years of education: Not on file   Highest education level: Not on file  Occupational History   Occupation: Retired  Tobacco Use   Smoking status: Never   Smokeless tobacco: Never  Vaping Use   Vaping status: Never Used  Substance and Sexual Activity   Alcohol use: No   Drug use: No   Sexual activity: Not Currently    Birth control/protection: Post-menopausal  Other Topics Concern   Not on file  Social History Narrative   Not on file   Social Determinants of Health   Financial Resource Strain: Low Risk  (07/25/2022)   Overall Financial Resource Strain (CARDIA)    Difficulty of Paying Living Expenses: Not hard at all  Food Insecurity: No Food Insecurity (02/01/2023)   Hunger Vital Sign    Worried About Running Out of Food in the Last Year: Never true    Ran Out of Food in the Last Year: Never true  Transportation Needs: No Transportation Needs (02/01/2023)   PRAPARE - Administrator, Civil Service (Medical): No    Lack of Transportation (Non-Medical): No  Physical Activity: Sufficiently Active (07/25/2022)   Exercise Vital Sign    Days of Exercise per Week: 7 days    Minutes of Exercise per  Session: 150+ min  Stress: No Stress Concern Present (07/25/2022)   [employer] of Occupational Health - Occupational Stress Questionnaire    Feeling of Stress : Not at all  Social Connections: Socially Isolated (07/25/2022)   Social Connection and Isolation Panel [NHANES]    Frequency of Communication with Friends and Family: More than three times a week    Frequency of Social Gatherings with Friends and Family: More than three times a week    Attends Religious Services: Never    Database administrator or Organizations: No    Attends Banker Meetings: Never    Marital Status: Divorced  Catering manager Violence: Not At Risk (02/01/2023)   Humiliation, Afraid, Rape, and Kick questionnaire  Fear of Current or Ex-Partner: No    Emotionally Abused: No    Physically Abused: No    Sexually Abused: No      Review of Systems  All other systems reviewed and are negative.      Objective:   Physical Exam Vitals reviewed.  Constitutional:      General: She is not in acute distress.    Appearance: She is obese. She is not ill-appearing, toxic-appearing or diaphoretic.  HENT:     Mouth/Throat:     Mouth: Mucous membranes are dry.  Eyes:     General: Lids are normal.     Extraocular Movements: Extraocular movements intact.     Conjunctiva/sclera: Conjunctivae normal.     Pupils: Pupils are equal, round, and reactive to light.  Neck:     Vascular: No carotid bruit.  Cardiovascular:     Rate and Rhythm: Regular rhythm. Tachycardia present.     Heart sounds: Normal heart sounds. No murmur heard.    No friction rub. No gallop.  Pulmonary:     Effort: Pulmonary effort is normal. No respiratory distress.     Breath sounds: Normal breath sounds. No stridor. No wheezing, rhonchi or rales.  Chest:     Chest wall: No tenderness.  Abdominal:     General: Bowel sounds are normal. There is no distension.     Palpations: Abdomen is soft.     Tenderness: There is no  abdominal tenderness. There is no guarding.  Musculoskeletal:     Cervical back: No tenderness.     Right lower leg: No edema.     Left lower leg: No edema.  Lymphadenopathy:     Cervical: No cervical adenopathy.  Neurological:     General: No focal deficit present.     Mental Status: She is alert and oriented to person, place, and time.     Cranial Nerves: No cranial nerve deficit.     Motor: No weakness.     Gait: Gait normal.         Assessment & Plan:  Right renal mass  Pulmonary nodules  Anemia due to acute blood loss  Weight loss I believe her anemia was likely due to the gastritis seen on EGD.  Patient is currently on an iron supplement.  No active bleeding was seen.  Biopsy reports revealed no evidence of H. pylori.  Repeat CBC today but hopefully with iron supplementation, her anemia will gradually improve.  Unfortunately I feel that her weight loss is due to the mass on her right kidney.  This appears to be renal cell carcinoma.  I will make an appointment for the patient to see a urologist to discuss nephrectomy.  This would also give [place] a tissue diagnosis.  Unfortunately, initial reports from her bronchoscopy did not conclusively determine the etiology of the pulmonary nodules.  I will wait to see pulmonary's recommendations but I suspect the patient will need a PET scan to determine if the patient requires systemic treatment of metastatic spread from the renal mass.  I will also place a consult with oncology to help direct her care.

## 2023-02-16 LAB — CBC WITH DIFFERENTIAL/PLATELET
Absolute Monocytes: 1739 {cells}/uL — ABNORMAL HIGH (ref 200–950)
Basophils Absolute: 48 {cells}/uL (ref 0–200)
Basophils Relative: 0.3 %
Eosinophils Absolute: 113 {cells}/uL (ref 15–500)
Eosinophils Relative: 0.7 %
HCT: 26.9 % — ABNORMAL LOW (ref 35.0–45.0)
Hemoglobin: 7.9 g/dL — ABNORMAL LOW (ref 11.7–15.5)
Lymphs Abs: 1964 {cells}/uL (ref 850–3900)
MCH: 22.4 pg — ABNORMAL LOW (ref 27.0–33.0)
MCHC: 29.4 g/dL — ABNORMAL LOW (ref 32.0–36.0)
MCV: 76.2 fL — ABNORMAL LOW (ref 80.0–100.0)
MPV: 9.6 fL (ref 7.5–12.5)
Monocytes Relative: 10.8 %
Neutro Abs: 12236 {cells}/uL — ABNORMAL HIGH (ref 1500–7800)
Neutrophils Relative %: 76 %
Platelets: 687 10*3/uL — ABNORMAL HIGH (ref 140–400)
RBC: 3.53 10*6/uL — ABNORMAL LOW (ref 3.80–5.10)
RDW: 18 % — ABNORMAL HIGH (ref 11.0–15.0)
Total Lymphocyte: 12.2 %
WBC: 16.1 10*3/uL — ABNORMAL HIGH (ref 3.8–10.8)

## 2023-02-16 LAB — COMPREHENSIVE METABOLIC PANEL
AG Ratio: 0.7 (calc) — ABNORMAL LOW (ref 1.0–2.5)
ALT: 26 U/L (ref 6–29)
AST: 18 U/L (ref 10–35)
Albumin: 2.7 g/dL — ABNORMAL LOW (ref 3.6–5.1)
Alkaline phosphatase (APISO): 108 U/L (ref 37–153)
BUN: 14 mg/dL (ref 7–25)
CO2: 24 mmol/L (ref 20–32)
Calcium: 8.5 mg/dL — ABNORMAL LOW (ref 8.6–10.4)
Chloride: 101 mmol/L (ref 98–110)
Creat: 0.79 mg/dL (ref 0.60–0.95)
Globulin: 3.7 g/dL (ref 1.9–3.7)
Glucose, Bld: 101 mg/dL — ABNORMAL HIGH (ref 65–99)
Potassium: 4.2 mmol/L (ref 3.5–5.3)
Sodium: 139 mmol/L (ref 135–146)
Total Bilirubin: 0.4 mg/dL (ref 0.2–1.2)
Total Protein: 6.4 g/dL (ref 6.1–8.1)

## 2023-02-18 ENCOUNTER — Telehealth: Payer: Self-pay | Admitting: Emergency Medicine

## 2023-02-18 ENCOUNTER — Encounter: Payer: Self-pay | Admitting: Emergency Medicine

## 2023-02-18 ENCOUNTER — Other Ambulatory Visit: Payer: Self-pay | Admitting: Emergency Medicine

## 2023-02-18 DIAGNOSIS — R918 Other nonspecific abnormal finding of lung field: Secondary | ICD-10-CM

## 2023-02-18 NOTE — Telephone Encounter (Signed)
I ordered PET scan, I put in a request to repeat her navigational bronchoscopy.  Please schedule the patient to be seen by either RB or APP in about 2 weeks

## 2023-02-18 NOTE — Telephone Encounter (Signed)
I will work on scheduling repeat bronchoscopy.  I will also go ahead and order the PET scan as it might help me with planning the case.

## 2023-02-18 NOTE — Progress Notes (Signed)
I ordered PET scan and put in request to repeat her navigational bronchoscopy.

## 2023-02-19 ENCOUNTER — Encounter: Payer: Self-pay | Admitting: Emergency Medicine

## 2023-02-26 ENCOUNTER — Ambulatory Visit (HOSPITAL_COMMUNITY)
Admission: RE | Admit: 2023-02-26 | Discharge: 2023-02-26 | Disposition: A | Discharge status: Home / Self Care | Payer: Medicare HMO | Source: Ambulatory Visit | Attending: Emergency Medicine | Admitting: Emergency Medicine

## 2023-02-26 DIAGNOSIS — R911 Solitary pulmonary nodule: Secondary | ICD-10-CM | POA: Diagnosis not present

## 2023-02-26 DIAGNOSIS — R918 Other nonspecific abnormal finding of lung field: Secondary | ICD-10-CM | POA: Insufficient documentation

## 2023-02-26 LAB — GLUCOSE, CAPILLARY: Glucose-Capillary: 114 mg/dL — ABNORMAL HIGH (ref 70–99)

## 2023-02-26 MED ORDER — FLUDEOXYGLUCOSE F - 18 (FDG) INJECTION
9.8000 | Freq: Once | INTRAVENOUS | Status: AC
  Administered 2023-02-26: 9.8 via INTRAVENOUS

## 2023-03-05 ENCOUNTER — Telehealth: Payer: Self-pay

## 2023-03-05 NOTE — Telephone Encounter (Signed)
Pt's daughter wanted to speak with nurse about pt recent lung screening results. Please advise.  Cb#: [phone]

## 2023-03-08 ENCOUNTER — Encounter (HOSPITAL_COMMUNITY): Payer: Self-pay | Admitting: Emergency Medicine

## 2023-03-08 ENCOUNTER — Encounter: Payer: Self-pay | Admitting: Family Medicine

## 2023-03-08 ENCOUNTER — Encounter: Payer: Self-pay | Admitting: Pulmonary Disease

## 2023-03-08 ENCOUNTER — Ambulatory Visit (INDEPENDENT_AMBULATORY_CARE_PROVIDER_SITE_OTHER): Payer: Medicare HMO | Admitting: Family Medicine

## 2023-03-08 ENCOUNTER — Telehealth: Payer: Self-pay | Admitting: Emergency Medicine

## 2023-03-08 ENCOUNTER — Other Ambulatory Visit: Payer: Self-pay

## 2023-03-08 VITALS — BP 120/74 | HR 72 | Temp 98.1°F | Ht 68.0 in | Wt 198.0 lb

## 2023-03-08 DIAGNOSIS — R5383 Other fatigue: Secondary | ICD-10-CM

## 2023-03-08 DIAGNOSIS — J329 Chronic sinusitis, unspecified: Secondary | ICD-10-CM

## 2023-03-08 MED ORDER — AMOXICILLIN 875 MG PO TABS: 875.0000 mg | ORAL_TABLET | Freq: Two times a day (BID) | ORAL | 0 refills | Status: DC

## 2023-03-08 NOTE — Progress Notes (Signed)
SDW CALL  Patient was given pre-op instructions over the phone. The opportunity was given for the patient to ask questions. No further questions asked. Patient verbalized understanding of instructions given.   PCP - Broadus John Pickard,MD Cardiologist - denies  PPM/ICD - denies Device Orders -  Rep Notified -   Chest x-ray - 02/12/23-CT EKG - 02/01/23 Stress Test -  ECHO -denies  Cardiac Cath -   Sleep Study - denies CPAP -   Fasting Blood Sugar - na Checks Blood Sugar _____ times a day  Blood Thinner Instructions:na Aspirin Instructions:  ERAS Protcol -no PRE-SURGERY Ensure or G2-   COVID TEST- na   Anesthesia review: yes  Patient denies shortness of breath, fever, cough and chest pain over the phone call    Surgical Instructions    Your procedure is scheduled on November 1  Report to [facility] Main Entrance "A" at 5:30 A.M., then check in with the Admitting office.  Call this number if you have problems the morning of surgery:  [phone]    Remember:  Do not eat or drink anything after midnight the night before your surgery     Take these medicines the morning of surgery with A SIP OF WATER: Protonix  As of today, STOP taking any Aspirin (unless otherwise instructed by your surgeon) Aleve, Naproxen, Ibuprofen, Motrin, Advil, Goody's, BC's, all herbal medications, fish oil, and all vitamins.  [facility] is not responsible for any belongings or valuables. .   Do NOT Smoke (Tobacco/Vaping)  24 hours prior to your procedure  If you use a CPAP at night, you may bring your mask for your overnight stay.   Contacts, glasses, hearing aids, dentures or partials may not be worn into surgery, please bring cases for these belongings   Patients discharged the day of surgery will not be allowed to drive home, and someone needs to stay with them for 24 hours.   Special instructions:    Oral Hygiene is also important to reduce your risk of infection.  Remember -  BRUSH YOUR TEETH THE MORNING OF SURGERY WITH YOUR REGULAR TOOTHPASTE   Day of Surgery:  Take a shower the day of or night before with antibacterial soap. Wear Clean/Comfortable clothing the morning of surgery Do not apply any deodorants/lotions.   Do not wear jewelry or makeup Do not wear lotions, powders, perfumes/colognes, or deodorant. Do not shave 48 hours prior to surgery.  Men may shave face and neck. Do not bring valuables to the hospital. Do not wear nail polish, gel polish, artificial nails, or any other type of covering on natural nails (fingers and toes) If you have artificial nails or gel coating that need to be removed by a nail salon, please have this removed prior to surgery. Artificial nails or gel coating may interfere with anesthesia's ability to adequately monitor your vital signs. Remember to brush your teeth WITH YOUR REGULAR TOOTHPASTE.

## 2023-03-08 NOTE — Progress Notes (Signed)
Subjective:    Patient ID: Diane Paul, female    DOB: Mar 03, 1941, 82 y.o.   MRN: [id number] Admit date: 02/01/2023 Discharge date: 02/06/2023 Recommendations for Outpatient Follow-up:  Follow up with PCP in 1 weeks-call for appointment FU w/ Dr Delton Coombes For Bronchoscopy on oct 8 Please obtain BMP/CBC in one week    Brief/Interim Summary: 60 yof w/ right breast cancer s/p mastectomy, HTN, arthritis, iron deficiency anemia who was referred to the hospital after worsening anemia on outpatient labs.She had recently undergone evaluation with primary care on 02/01/2023 due to worsening fatigue.  Anemic workup was initiated.She had denied any abdominal pain or change in stools notably negative for melena. Hemoglobin was noted to be 7 g/dL and down trended to 6.7 g/dL.She was transfused 1 unit PRBC 9/28  with improvement in hemoglobin. CT angio abdomen/pelvis was performed initially looking for possible bleeding etiology.  This was negative for evidence of GI bleed but showed underlying 11 cm right kidney mass concerning for RCC.  This was then followed by CT chest which showed numerous bilateral pulmonary nodules concerning for metastatic lesions.  She was admitted for GI and urology evaluation underwent EGD and colonoscopy on 02/03/2023. Planning for outpatient urology follow-up to discuss nephrectomy and Metopirone planning for bronchoscopy coming Friday She was monitored overnight> hemoglobin trending as  6.7> 9.2> 8.2> 8.2> 7.6> 9.0> 8.2 g  Overall doing well. Saw her with daughter at bedside, agreeable with plan for home today and return friday for outpatient bronchoscopy.she has been ambulating without assistance, she has Bronchoscopy planned for Friday as outpatient.      Discharge Diagnoses:  Principal Problem:   Microcytic anemia Active Problems:   Renal cell carcinoma (HCC)   Weight loss   Thrombocytosis   Symptomatic anemia   Malignant neoplasm metastatic to both lungs (HCC)    Microcytic anemia Iron deficiency anemia She was admitted for GI and urology evaluation underwent EGD and colonoscopy on 02/03/2023. Planning for outpatient urology follow-up to discuss nephrectomy and Metopirone planning for bronchoscopy coming Friday She was monitored overnight> hemoglobin trending as  6.7> 9.2> 8.2> 8.2> 7.6> 9.0> 8.2 g  Overall doing well. Saw her with daughter at bedside, agreeable with plan for home today and return friday for outpatient bronchoscopy.she has been ambulating without assistance, she has Bronchoscopy planned for Friday as outpatient   -EGD revealed erosive gastropathy without stigmata of recent bleeding and underlying gastritis -Colonoscopy showed 2 polyps which were removed -Follow-up H. pylori testing; if positive quadruple therapy recommended per GI; if negative, capsule endoscopy recommended -Iron stores technically normal with slightly increased iron sat ratio, again possibly in setting of underlying RCC Fu with Dr Elnoria Howard for [facility] and surgical pathology Last Labs         Recent Labs  Lab 02/04/23 0802 02/04/23 1838 02/05/23 0644 02/05/23 1702 02/06/23 0548  HGB 8.2* 8.2* 7.6* 9.0* 8.2*  HCT 27.5* 27.1* 24.7* 30.5* 26.6*       Right renal cell carcinoma Multiple pulmonary nodules CTA with incidental finding of right renal mass 11.1 x 9.1 x 6.6 cm concerning for RCC.  Patient and daughters aware of findings including lung nodules Outpatient follow-up with urology planned for nephrectomy- provided w/ [facility]- also called the urology office for an appointment and left details. Oncology aware, she will follow-up with Dr. Cherly Hensen Pulmonology consulted for bronchoscopy which is planned for 02/12/23.   Thrombocytosis Suspect reactive in setting of IDA versus paraneoplastic   Leukocytosis Suspect related to  RCC Low-grade fever could be in the setting of RCC no other cough or urinary symptoms advised to watch for any kind of fever  episodes for cough abdominal pain or urinary symptoms and notify MD or come to the ED   Weight loss  Review of weight trend shows 20 pound weight loss since 6/24.  Albumin is 1.9.  Nutrition Problem: Increased nutrient needs Etiology: chronic illness Signs/Symptoms: estimated needs Interventions: Ensure Enlive (each supplement provides 350kcal and 20 grams of protein)   Hypokalemia -Repleted   HTN-  Recently bp meds been on hold by her pcp   Chronic medical problems: Breast cancer with history of mastectomy: Outpatient oncology follow-up Hypertension: No longer on antihypertensives  02/15/23  Patient is here today with her daughter.  Patient had bronchoscopy on October 8.  The pathology report from the left lung revealed no malignant cells.  Preliminary report from the right lung revealed atypical cells but was nondiagnostic.  They have not spoken to pulmonary yet regarding the biopsy results.  They have also not had an appointment yet to see a urologist.  She continues to feel weak and tired.  She also reports postnasal drip, sinus drainage, and thick phlegm she has to cough up.  She is also dealing with constipation.  At that time, my plan was: I believe her anemia was likely due to the gastritis seen on EGD.  Patient is currently on an iron supplement.  No active bleeding was seen.  Biopsy reports revealed no evidence of H. pylori.  Repeat CBC today but hopefully with iron supplementation, her anemia will gradually improve.  Unfortunately I feel that her weight loss is due to the mass on her right kidney.  This appears to be renal cell carcinoma.  I will make an appointment for the patient to see a urologist to discuss nephrectomy.  This would also give [place] a tissue diagnosis.  Unfortunately, initial reports from her bronchoscopy did not conclusively determine the etiology of the pulmonary nodules.  I will wait to see pulmonary's recommendations but I suspect the patient will need a PET scan  to determine if the patient requires systemic treatment of metastatic spread from the renal mass.  I will also place a consult with oncology to help direct her care.    03/08/23 Patient is here today with her son.  She seems confused regarding situation.  She states she is unsure why she feels so tired.  Her son is also unsure of exactly what is going on.  I spent more than 20 minutes explaining to the patient and her son the situation.  I explained that there is a malignancy on her right kidney.  There are also spots seen within the lungs.  This could either represent metastatic renal cell carcinoma versus RCC with a separate lung cancer.  This is extremely important in determining treatment.  Patient has an appointment November 5 to discuss with urologist treatment of the right renal lesion.  She has appointment November 8th with with PULM.  I believe pulmonology was awaiting the results of the PET scan to determine which lesions are more amenable for biopsy.  PET scan has been performed but has not been read yet.  Therefore I explained to the son and the patient that the pulmonologist will be looking at the PET scan to dictate the next best course of action regarding biopsy.  This will dictate treatment.  The patient is also concerned about her anemia and wants to recheck her  levels.  She is still taking iron but dealing with severe constipation from this.  She also reports sinus congestion, postnasal drip, and nonproductive cough for the last 2 weeks.  She has been taking decongestants without any benefit. Past Medical History:  Diagnosis Date   Anemia    Anxiety    nervous sometimes, denies panica ttacks    Arthritis    knees ?   Blood transfusion    post vaginal birth- 1960, 01/2023- anemia   Breast cancer (HCC)    Right Breast Cancer   Cancer (HCC)    R breast cancer   Dry mouth    GERD (gastroesophageal reflux disease)    Hypertension    Neuromuscular disorder (HCC)    L leg, thigh- "burns  sometimes"   Shortness of breath    sometimes    Use of letrozole (Femara) 05/07/2010   neoadjuvant femara therapy since 1/212   Past Surgical History:  Procedure Laterality Date   ABDOMINAL HYSTERECTOMY     BIOPSY  02/03/2023   Procedure: BIOPSY;  Surgeon: Jeani Hawking, MD;  Location: [facility] ENDOSCOPY;  Service: Gastroenterology;;  gastric   BREAST LUMPECTOMY     BREAST SURGERY     Right   BRONCHIAL BIOPSY  02/12/2023   Procedure: BRONCHIAL BIOPSIES;  Surgeon: Leslye Peer, MD;  Location: [facility] ENDOSCOPY;  Service: Pulmonary;;   BRONCHIAL BRUSHINGS  02/12/2023   Procedure: BRONCHIAL BRUSHINGS;  Surgeon: Leslye Peer, MD;  Location: [facility] ENDOSCOPY;  Service: Pulmonary;;   BRONCHIAL NEEDLE ASPIRATION BIOPSY  02/12/2023   Procedure: BRONCHIAL NEEDLE ASPIRATION BIOPSIES;  Surgeon: Leslye Peer, MD;  Location: [facility] ENDOSCOPY;  Service: Pulmonary;;   BRONCHIAL WASHINGS  02/12/2023   Procedure: BRONCHIAL WASHINGS;  Surgeon: Leslye Peer, MD;  Location: [facility] ENDOSCOPY;  Service: Pulmonary;;   COLONOSCOPY WITH PROPOFOL N/A 02/03/2023   Procedure: COLONOSCOPY WITH PROPOFOL;  Surgeon: Jeani Hawking, MD;  Location: [facility] ENDOSCOPY;  Service: Gastroenterology;  Laterality: N/A;   ESOPHAGOGASTRODUODENOSCOPY (EGD) WITH PROPOFOL N/A 02/03/2023   Procedure: ESOPHAGOGASTRODUODENOSCOPY (EGD) WITH PROPOFOL;  Surgeon: Jeani Hawking, MD;  Location: [facility] ENDOSCOPY;  Service: Gastroenterology;  Laterality: N/A;   HEMOSTASIS CLIP PLACEMENT  02/03/2023   Procedure: HEMOSTASIS CLIP PLACEMENT;  Surgeon: Jeani Hawking, MD;  Location: [facility] ENDOSCOPY;  Service: Gastroenterology;;  ascending colon polyp   jp drains     s/p masectomy 06/11/11/ Dr. [name], 2 jp drains right chest wall   MASTECTOMY Right    malignant   MASTECTOMY W/ SENTINEL NODE BIOPSY  06/11/2011/Right BReast   Procedure: MASTECTOMY WITH SENTINEL LYMPH NODE BIOPSY;  Surgeon: Ernestene Mention, MD;  Location: [facility] OR;  Service: General;  Laterality: Right;  Right  total mastectomy and sentinel lymph node biopsy using lymphatic mapping and blue dye injection.   POLYPECTOMY  02/03/2023   Procedure: POLYPECTOMY;  Surgeon: Jeani Hawking, MD;  Location: [facility] ENDOSCOPY;  Service: Gastroenterology;;  hot snare polypectomy ascending colon   SUBMUCOSAL LIFTING INJECTION  02/03/2023   Procedure: SUBMUCOSAL LIFTING INJECTION;  Surgeon: Jeani Hawking, MD;  Location: [facility] ENDOSCOPY;  Service: Gastroenterology;;  ever lift 16cc ascending colon   TUBAL LIGATION     [place] FINE NEEDLE ASPIRATION WO/ W SMEAR  05/22/10   Left Breast- cyst or abscess    Current Outpatient Medications on File Prior to Visit  Medication Sig Dispense Refill   Ensure (ENSURE) Take 237 mLs by mouth.     Iron, Ferrous Sulfate, 325 (65 Fe) MG TABS Take 325 mg by mouth  daily. 30 tablet 0   pantoprazole (PROTONIX) 40 MG tablet Take 1 tablet (40 mg total) by mouth 2 (two) times daily. 60 tablet 0   No current facility-administered medications on file prior to visit.   Allergies  Allergen Reactions   Fosamax [Alendronate Sodium] Other (See Comments)    Aches and pains   Social History   Socioeconomic History   Marital status: Single    Spouse name: Not on file   Number of children: 5   Years of education: Not on file   Highest education level: Not on file  Occupational History   Occupation: Retired  Tobacco Use   Smoking status: Never   Smokeless tobacco: Never  Vaping Use   Vaping status: Never Used  Substance and Sexual Activity   Alcohol use: No   Drug use: No   Sexual activity: Not Currently    Birth control/protection: Post-menopausal  Other Topics Concern   Not on file  Social History Narrative   Not on file   Social Determinants of Health   Financial Resource Strain: Low Risk  (07/25/2022)   Overall Financial Resource Strain (CARDIA)    Difficulty of Paying Living Expenses: Not hard at all  Food Insecurity: No Food Insecurity (02/01/2023)   Hunger Vital Sign    Worried About  Running Out of Food in the Last Year: Never true    Ran Out of Food in the Last Year: Never true  Transportation Needs: No Transportation Needs (02/01/2023)   PRAPARE - Administrator, Civil Service (Medical): No    Lack of Transportation (Non-Medical): No  Physical Activity: Sufficiently Active (07/25/2022)   Exercise Vital Sign    Days of Exercise per Week: 7 days    Minutes of Exercise per Session: 150+ min  Stress: No Stress Concern Present (07/25/2022)   [employer] of Occupational Health - Occupational Stress Questionnaire    Feeling of Stress : Not at all  Social Connections: Socially Isolated (07/25/2022)   Social Connection and Isolation Panel [NHANES]    Frequency of Communication with Friends and Family: More than three times a week    Frequency of Social Gatherings with Friends and Family: More than three times a week    Attends Religious Services: Never    Database administrator or Organizations: No    Attends Banker Meetings: Never    Marital Status: Divorced  Catering manager Violence: Not At Risk (02/01/2023)   Humiliation, Afraid, Rape, and Kick questionnaire    Fear of Current or Ex-Partner: No    Emotionally Abused: No    Physically Abused: No    Sexually Abused: No      Review of Systems  All other systems reviewed and are negative.      Objective:   Physical Exam Vitals reviewed.  Constitutional:      General: She is not in acute distress.    Appearance: She is obese. She is not ill-appearing, toxic-appearing or diaphoretic.  HENT:     Right Ear: Tympanic membrane and ear canal normal.     Left Ear: Tympanic membrane and ear canal normal.     Nose: Congestion and rhinorrhea present.     Mouth/Throat:     Mouth: Mucous membranes are dry.     Pharynx: No oropharyngeal exudate or posterior oropharyngeal erythema.  Eyes:     General: Lids are normal.     Extraocular Movements: Extraocular movements intact.      Conjunctiva/sclera:  Conjunctivae normal.     Pupils: Pupils are equal, round, and reactive to light.  Neck:     Vascular: No carotid bruit.  Cardiovascular:     Rate and Rhythm: Regular rhythm. Tachycardia present.     Heart sounds: Normal heart sounds. No murmur heard.    No friction rub. No gallop.  Pulmonary:     Effort: Pulmonary effort is normal. No respiratory distress.     Breath sounds: Normal breath sounds. No stridor. No wheezing, rhonchi or rales.  Chest:     Chest wall: No tenderness.  Abdominal:     General: Bowel sounds are normal. There is no distension.     Palpations: Abdomen is soft.     Tenderness: There is no abdominal tenderness. There is no guarding.  Musculoskeletal:     Cervical back: No tenderness.     Right lower leg: No edema.     Left lower leg: No edema.  Lymphadenopathy:     Cervical: No cervical adenopathy.  Neurological:     General: No focal deficit present.     Mental Status: She is alert and oriented to person, place, and time.     Cranial Nerves: No cranial nerve deficit.     Motor: No weakness.     Gait: Gait normal.         Assessment & Plan:  Fatigue, unspecified type - Plan: BASIC METABOLIC PANEL WITH GFR, CBC with Differential/Platelet  Rhinosinusitis I believe the fatigue is multifactorial.  As said earlier, I spent more than 20 minutes explaining to the patient and her son the situation.  I explained that there is a malignancy on her right kidney.  There are also spots seen within the lungs.  This could either represent metastatic renal cell carcinoma versus RCC with a separate lung cancer.  This is extremely important in determining treatment.  Patient has an appointment November 5 to discuss with urologist treatment of the right renal lesion.  She has appointment November 8th with with PULM.  I believe pulmonology was awaiting the results of the PET scan to determine which lesions are more amenable for biopsy.  PET scan has been  performed but has not been read yet.  Therefore I explained to the son and the patient that the pulmonologist will be looking at the PET scan to dictate the next best course of action regarding biopsy.  This will dictate treatment.  I believe that this is the most likely contributing source to her fatigue coupled with her anemia.  I will repeat a CBC to monitor her blood counts.  I also believe that she has developed a sinus infection which I will treat with amoxicillin 875 mg twice daily for 10 days

## 2023-03-08 NOTE — Progress Notes (Signed)
Spoke with Judeth Cornfield RN in [place] calling for Dr. Raeanne Gathers call for pulmonary. Notified her that pt started antibiotics today for sinus infection and she is scheduled for bronch on Monday at 7:30 am the patient is to arrive at hospital 0530. Nobie Putnam I wanted to make Dr. Delton Coombes aware that pt was taking antibiotics.

## 2023-03-08 NOTE — Telephone Encounter (Signed)
Pt is on Amoxicillin  Has Bronch on Monday at 7 am with Dr Delton Coombes Wanted to let DR Delton Coombes knows that she is on Abx

## 2023-03-08 NOTE — Progress Notes (Signed)
SDW CALL  Patient was given pre-op instructions over the phone. The opportunity was given for the patient to ask questions. No further questions asked. Patient verbalized understanding of instructions given.   PCP - [place] John Pickard,Diane Paul Cardiologist - denies  PPM/ICD - denies Device Orders -  Rep Notified -   Chest x-ray - 02/12/23 EKG - 02/01/23 Stress Test - denies ECHO - denies Cardiac Cath - denies  Sleep Study - denies CPAP -   Fasting Blood Sugar - na Checks Blood Sugar _____ times a day  Blood Thinner Instructions:na Aspirin Instructions:na  ERAS Protcol -no PRE-SURGERY Ensure or G2-   COVID TEST- no   Anesthesia review: yes-pt prescribed 10 day course of amoxicillin today by PCP,Diane Pickard,Diane Paul for sinus infection. Pt stated she had cough,clear sinus drainage;denied fever,sore throat. Diane Chock PA notified. [employer] sent to Diane Paul. Called answering service at Diane Paul office and left message for on call physician  stating that Diane Paul patient is coming for 7:30 bronchoscopy on Monday November 4. Wanted to make Diane Paul aware that pt has started antibiotics.  Patient denies shortness of breath, fever,  and chest pain over the phone call    Surgical Instructions    Your procedure is scheduled on November 4  Report to [facility] Main Entrance "A" at 0530 A.M., then check in with the Admitting office.  Call this number if you have problems the morning of surgery:  [phone]    Remember:  Do not eat or drink anything after midnight the night before your surgery   Take these medicines the morning of surgery with A SIP OF WATER: Protonix  As of today, STOP taking any Aspirin (unless otherwise instructed by your surgeon) Aleve, Naproxen, Ibuprofen, Motrin, Advil, Goody's, BC's, all herbal medications, fish oil, and all vitamins.  [facility] is not responsible for any belongings or valuables. .   Do NOT Smoke (Tobacco/Vaping)  24 hours prior to  your procedure  If you use a CPAP at night, you may bring your mask for your overnight stay.   Contacts, glasses, hearing aids, dentures or partials may not be worn into surgery, please bring cases for these belongings   Patients discharged the day of surgery will not be allowed to drive home, and someone needs to stay with them for 24 hours.   Special instructions:    Oral Hygiene is also important to reduce your risk of infection.  Remember - BRUSH YOUR TEETH THE MORNING OF SURGERY WITH YOUR REGULAR TOOTHPASTE   Day of Surgery:  Take a shower the day of or night before with antibacterial soap. Wear Clean/Comfortable clothing the morning of surgery Do not apply any deodorants/lotions.   Do not wear jewelry or makeup Do not wear lotions, powders, perfumes/colognes, or deodorant. Do not shave 48 hours prior to surgery.  Men may shave face and neck. Do not bring valuables to the hospital. Do not wear nail polish, gel polish, artificial nails, or any other type of covering on natural nails (fingers and toes) If you have artificial nails or gel coating that need to be removed by a nail salon, please have this removed prior to surgery. Artificial nails or gel coating may interfere with anesthesia's ability to adequately monitor your vital signs. Remember to brush your teeth WITH YOUR REGULAR TOOTHPASTE.

## 2023-03-08 NOTE — Telephone Encounter (Signed)
PT LM w/Ans Ser about results of procedure. Please call @ [phone]

## 2023-03-09 ENCOUNTER — Telehealth: Payer: Self-pay | Admitting: Pulmonary Disease

## 2023-03-09 NOTE — Telephone Encounter (Signed)
Pt is on Abx for sinusitis would like DR Byrum to know as she will have a Bronch on Monday AM

## 2023-03-11 ENCOUNTER — Ambulatory Visit (HOSPITAL_COMMUNITY)
Admission: RE | Admit: 2023-03-11 | Discharge: 2023-03-11 | Disposition: A | Discharge status: Home / Self Care | Payer: Medicare HMO | Attending: Emergency Medicine | Admitting: Emergency Medicine

## 2023-03-11 ENCOUNTER — Other Ambulatory Visit: Payer: Self-pay

## 2023-03-11 ENCOUNTER — Ambulatory Visit (HOSPITAL_COMMUNITY): Payer: Medicare HMO

## 2023-03-11 ENCOUNTER — Encounter (HOSPITAL_COMMUNITY): Payer: Self-pay | Admitting: Emergency Medicine

## 2023-03-11 ENCOUNTER — Ambulatory Visit (HOSPITAL_COMMUNITY): Payer: Medicare HMO | Admitting: Anesthesiology

## 2023-03-11 ENCOUNTER — Ambulatory Visit (HOSPITAL_BASED_OUTPATIENT_CLINIC_OR_DEPARTMENT_OTHER): Payer: Medicare HMO | Admitting: Anesthesiology

## 2023-03-11 ENCOUNTER — Encounter (HOSPITAL_COMMUNITY)
Admission: RE | Disposition: A | Discharge status: Home / Self Care | Payer: Self-pay | Source: Home / Self Care | Attending: Emergency Medicine

## 2023-03-11 DIAGNOSIS — K219 Gastro-esophageal reflux disease without esophagitis: Secondary | ICD-10-CM | POA: Insufficient documentation

## 2023-03-11 DIAGNOSIS — J9811 Atelectasis: Secondary | ICD-10-CM | POA: Diagnosis not present

## 2023-03-11 DIAGNOSIS — Z48813 Encounter for surgical aftercare following surgery on the respiratory system: Secondary | ICD-10-CM | POA: Diagnosis not present

## 2023-03-11 DIAGNOSIS — Z853 Personal history of malignant neoplasm of breast: Secondary | ICD-10-CM | POA: Insufficient documentation

## 2023-03-11 DIAGNOSIS — I10 Essential (primary) hypertension: Secondary | ICD-10-CM | POA: Diagnosis not present

## 2023-03-11 DIAGNOSIS — R918 Other nonspecific abnormal finding of lung field: Secondary | ICD-10-CM

## 2023-03-11 DIAGNOSIS — N2889 Other specified disorders of kidney and ureter: Secondary | ICD-10-CM | POA: Diagnosis not present

## 2023-03-11 DIAGNOSIS — D649 Anemia, unspecified: Secondary | ICD-10-CM | POA: Diagnosis not present

## 2023-03-11 DIAGNOSIS — C3412 Malignant neoplasm of upper lobe, left bronchus or lung: Secondary | ICD-10-CM | POA: Insufficient documentation

## 2023-03-11 DIAGNOSIS — C799 Secondary malignant neoplasm of unspecified site: Secondary | ICD-10-CM | POA: Diagnosis present

## 2023-03-11 HISTORY — PX: BRONCHIAL BRUSHINGS: SHX5108

## 2023-03-11 HISTORY — PX: BRONCHIAL BIOPSY: SHX5109

## 2023-03-11 HISTORY — PX: BRONCHIAL NEEDLE ASPIRATION BIOPSY: SHX5106

## 2023-03-11 LAB — TYPE AND SCREEN
ABO/RH(D): A POS
Antibody Screen: NEGATIVE

## 2023-03-11 LAB — POCT I-STAT, CHEM 8
BUN: 12 mg/dL (ref 8–23)
Calcium, Ion: 1.11 mmol/L — ABNORMAL LOW (ref 1.15–1.40)
Chloride: 98 mmol/L (ref 98–111)
Creatinine, Ser: 0.8 mg/dL (ref 0.44–1.00)
Glucose, Bld: 118 mg/dL — ABNORMAL HIGH (ref 70–99)
HCT: 21 % — ABNORMAL LOW (ref 36.0–46.0)
Hemoglobin: 7.1 g/dL — ABNORMAL LOW (ref 12.0–15.0)
Potassium: 3.9 mmol/L (ref 3.5–5.1)
Sodium: 134 mmol/L — ABNORMAL LOW (ref 135–145)
TCO2: 23 mmol/L (ref 22–32)

## 2023-03-11 SURGERY — BRONCHOSCOPY, WITH BIOPSY USING ELECTROMAGNETIC NAVIGATION
Anesthesia: General

## 2023-03-11 MED ORDER — PHENYLEPHRINE 80 MCG/ML (10ML) SYRINGE FOR IV PUSH (FOR BLOOD PRESSURE SUPPORT)
PREFILLED_SYRINGE | INTRAVENOUS | Status: DC | PRN
  Administered 2023-03-11: 40 ug via INTRAVENOUS
  Administered 2023-03-11: 160 ug via INTRAVENOUS
  Administered 2023-03-11: 120 ug via INTRAVENOUS

## 2023-03-11 MED ORDER — CHLORHEXIDINE GLUCONATE 0.12 % MT SOLN
OROMUCOSAL | Status: AC
  Filled 2023-03-11: qty 15

## 2023-03-11 MED ORDER — PHENYLEPHRINE HCL-NACL 20-0.9 MG/250ML-% IV SOLN
INTRAVENOUS | Status: DC | PRN
  Administered 2023-03-11: 20 ug/min via INTRAVENOUS

## 2023-03-11 MED ORDER — LIDOCAINE 2% (20 MG/ML) 5 ML SYRINGE
INTRAMUSCULAR | Status: DC | PRN
  Administered 2023-03-11: 100 mg via INTRAVENOUS

## 2023-03-11 MED ORDER — EPHEDRINE SULFATE-NACL 50-0.9 MG/10ML-% IV SOSY
PREFILLED_SYRINGE | INTRAVENOUS | Status: DC | PRN
  Administered 2023-03-11: 10 mg via INTRAVENOUS
  Administered 2023-03-11: 5 mg via INTRAVENOUS

## 2023-03-11 MED ORDER — SODIUM CHLORIDE 0.9 % IV SOLN: INTRAVENOUS | Status: DC | PRN

## 2023-03-11 MED ORDER — ALBUMIN HUMAN 5 % IV SOLN: INTRAVENOUS | Status: DC | PRN

## 2023-03-11 MED ORDER — PROPOFOL 10 MG/ML IV BOLUS
INTRAVENOUS | Status: DC | PRN
  Administered 2023-03-11: 110 mg via INTRAVENOUS
  Administered 2023-03-11: 30 mg via INTRAVENOUS

## 2023-03-11 MED ORDER — SUGAMMADEX SODIUM 200 MG/2ML IV SOLN
INTRAVENOUS | Status: DC | PRN
  Administered 2023-03-11: 200 mg via INTRAVENOUS

## 2023-03-11 MED ORDER — SODIUM CHLORIDE 0.9% FLUSH: 10.0000 mL | Freq: Two times a day (BID) | INTRAVENOUS | Status: DC

## 2023-03-11 MED ORDER — DEXAMETHASONE SODIUM PHOSPHATE 10 MG/ML IJ SOLN
INTRAMUSCULAR | Status: DC | PRN
  Administered 2023-03-11: 10 mg via INTRAVENOUS

## 2023-03-11 MED ORDER — ROCURONIUM BROMIDE 10 MG/ML (PF) SYRINGE
PREFILLED_SYRINGE | INTRAVENOUS | Status: DC | PRN
  Administered 2023-03-11: 10 mg via INTRAVENOUS
  Administered 2023-03-11: 20 mg via INTRAVENOUS
  Administered 2023-03-11: 50 mg via INTRAVENOUS

## 2023-03-11 MED ORDER — ACETAMINOPHEN 500 MG PO TABS
1000.0000 mg | ORAL_TABLET | Freq: Once | ORAL | Status: AC
  Administered 2023-03-11: 1000 mg via ORAL
  Filled 2023-03-11: qty 2

## 2023-03-11 MED ORDER — ONDANSETRON HCL 4 MG/2ML IJ SOLN
INTRAMUSCULAR | Status: DC | PRN
  Administered 2023-03-11: 4 mg via INTRAVENOUS

## 2023-03-11 MED ORDER — PROPOFOL 500 MG/50ML IV EMUL
INTRAVENOUS | Status: DC | PRN
  Administered 2023-03-11: 100 ug/kg/min via INTRAVENOUS

## 2023-03-11 NOTE — Discharge Instructions (Addendum)
Flexible Bronchoscopy, Care After This sheet gives you information about how to care for yourself after your test. Your doctor may also give you more specific instructions. If you have problems or questions, contact your doctor. Follow these instructions at home: Eating and drinking When your numbness is gone and your cough and gag reflexes have come back, you may: Eat only soft foods. Slowly drink liquids. The day after the test, go back to your normal diet. Driving Do not drive for 24 hours if you were given a medicine to help you relax (sedative). Do not drive or use heavy machinery while taking prescription pain medicine. General instructions  Take over-the-counter and prescription medicines only as told by your doctor. Return to your normal activities as told. Ask what activities are safe for you. Do not use any products that have nicotine or tobacco in them. This includes cigarettes and e-cigarettes. If you need help quitting, ask your doctor. Keep all follow-up visits as told by your doctor. This is important. It is very important if you had a tissue sample (biopsy) taken. Get help right away if: You have shortness of breath that gets worse. You get light-headed. You feel like you are going to pass out (faint). You have chest pain. You cough up: More than a little blood. More blood than before. Summary Do not eat or drink anything (not even water) for 2 hours after your test, or until your numbing medicine wears off. Do not use cigarettes. Do not use e-cigarettes. Get help right away if you have chest pain.  Please call our office for any questions or concerns.  [phone].  This information is not intended to replace advice given to you by your health care provider. Make sure you discuss any questions you have with your health care provider. Document Released: 02/18/2009 Document Revised: 04/05/2017 Document Reviewed: 05/11/2016 Elsevier Patient Education  2020 Elsevier  Inc.  

## 2023-03-11 NOTE — Anesthesia Postprocedure Evaluation (Signed)
Anesthesia Post Note  Patient: Diane Paul  Procedure(s) Performed: ROBOTIC ASSISTED NAVIGATIONAL BRONCHOSCOPY BRONCHIAL BIOPSIES BRONCHIAL BRUSHINGS BRONCHIAL NEEDLE ASPIRATION BIOPSIES     Patient location during evaluation: PACU Anesthesia Type: General Level of consciousness: awake and alert, oriented and patient cooperative Pain management: pain level controlled Vital Signs Assessment: post-procedure vital signs reviewed and stable Respiratory status: spontaneous breathing, nonlabored ventilation and respiratory function stable Cardiovascular status: blood pressure returned to baseline and stable Postop Assessment: no apparent nausea or vomiting Anesthetic complications: no   No notable events documented.  Last Vitals:  Vitals:   03/11/23 1000 03/11/23 1008  BP: (!) 93/58 (!) 92/56  Pulse: 91 91  Resp: (!) 33 20  Temp:  36.6 C  SpO2: 100% 100%    Last Pain:  Vitals:   03/11/23 1008  TempSrc:   PainSc: 0-No pain                 Lannie Fields

## 2023-03-11 NOTE — Transfer of Care (Addendum)
Immediate Anesthesia Transfer of Care Note  Patient: Diane Paul  Procedure(s) Performed: ROBOTIC ASSISTED NAVIGATIONAL BRONCHOSCOPY BRONCHIAL BIOPSIES BRONCHIAL BRUSHINGS BRONCHIAL NEEDLE ASPIRATION BIOPSIES  Patient Location: PACU  Anesthesia Type:General  Level of Consciousness: drowsy  Airway & Oxygen Therapy: Patient Spontanous Breathing and Patient connected to face mask oxygen  Post-op Assessment: Report given to RN and Post -op Vital signs reviewed and stable  Post vital signs: Reviewed and stable  Last Vitals:  Vitals Value Taken Time  BP 89/59 03/11/23 1000  Temp 36.6 C 03/11/23 0933  Pulse 97 03/11/23 1000  Resp 33 03/11/23 0959  SpO2 100 % 03/11/23 1000  Vitals shown include unfiled device data.  Last Pain:  Vitals:   03/11/23 0933  TempSrc:   PainSc: 0-No pain         Complications: No notable events documented.

## 2023-03-11 NOTE — Op Note (Signed)
Video Bronchoscopy with Robotic Assisted Bronchoscopic Navigation   Date of Operation: 03/11/2023   Pre-op Diagnosis: Bilateral pulmonary nodules  Post-op Diagnosis: Same  Surgeon: Levy Pupa  Assistants: None  Anesthesia: General endotracheal anesthesia  Operation: Flexible video fiberoptic bronchoscopy with robotic assistance and biopsies.  Estimated Blood Loss: Minimal  Complications: None  Indications and History: Diane Paul is a 82 y.o. female with history of breast cancer and a newly identified renal mass.  She has bilateral pulmonary nodules of unclear cause.  Previous navigational bronchoscopy was nondiagnostic.  Recommendation made to repeat her procedure to achieve a tissue diagnosis.  The risks, benefits, complications, treatment options and expected outcomes were discussed with the patient.  The possibilities of pneumothorax, pneumonia, reaction to medication, pulmonary aspiration, perforation of a viscus, bleeding, failure to diagnose a condition and creating a complication requiring transfusion or operation were discussed with the patient who freely signed the consent.    Description of Procedure: The patient was seen in the Preoperative Area, was examined and was deemed appropriate to proceed.  The patient was taken to [facility] endoscopy room 3, identified as Docia Barrier and the procedure verified as Flexible Video Fiberoptic Bronchoscopy.  A Time Out was held and the above information confirmed.   Prior to the date of the procedure a high-resolution CT scan of the chest was performed. Utilizing ION software program a virtual tracheobronchial tree was generated to allow the creation of distinct navigation pathways to the patient's parenchymal abnormalities. After being taken to the operating room general anesthesia was initiated and the patient  was orally intubated. The video fiberoptic bronchoscope was introduced via the endotracheal tube and a general inspection was  performed which showed normal right and left lung anatomy, aspiration of the bilateral mainstems was completed to remove any remaining secretions. Robotic catheter inserted into patient's endotracheal tube.   Target #1 right upper lobe pulmonary nodule: The distinct navigation pathways prepared prior to this procedure were then utilized to navigate to patient's lesion identified on CT scan. The robotic catheter was secured into place and the vision probe was withdrawn.  Lesion location was approximated using fluoroscopy.  Local registration and targeting was performed using CIOs three-dimensional imaging. Under fluoroscopic guidance transbronchial needle brushings, transbronchial needle biopsies, and transbronchial forceps biopsies were performed to be sent for cytology and pathology.   Target #2 left upper lobe pulmonary nodule: The distinct navigation pathways prepared prior to this procedure were then utilized to navigate to patient's lesion identified on CT scan. The robotic catheter was secured into place and the vision probe was withdrawn.  Lesion location was approximated using fluoroscopy. Local registration and targeting was performed using CIOs three-dimensional imaging. Under fluoroscopic guidance transbronchial needle brushings, transbronchial needle biopsies, and transbronchial forceps biopsies were performed to be sent for cytology and pathology.  At the end of the procedure a general airway inspection was performed and there was no evidence of active bleeding. The bronchoscope was removed.  The patient tolerated the procedure well. There was no significant blood loss and there were no obvious complications. A post-procedural chest x-ray is pending.  Samples Target #1: 1. Transbronchial needle brushings from right upper lobe pulmonary nodule 2. Transbronchial Wang needle biopsies from right upper lobe pulmonary nodule 3. Transbronchial forceps biopsies from right upper lobe pulmonary  nodule  Samples Target #2: 1. Transbronchial needle brushings from left upper lobe pulmonary nodule 2. Transbronchial Wang needle biopsies from left upper lobe pulmonary nodule 3. Transbronchial forceps biopsies from  left upper lobe pulmonary nodule   Plans:  The patient will be discharged from the PACU to home when recovered from anesthesia and after chest x-ray is reviewed. We will review the cytology, pathology and microbiology results with the patient when they become available. Outpatient followup will be with Dr. Delton Coombes.   Levy Pupa, MD, PhD 03/11/2023, 9:44 AM [facility] [phone] or if no answer before 7:00PM call [phone] For any issues after 7:00PM please call eLink [phone]

## 2023-03-11 NOTE — Anesthesia Procedure Notes (Addendum)
Procedure Name: Intubation Date/Time: 03/11/2023 7:48 AM  Performed by: Camillia Herter, CRNAPre-anesthesia Checklist: Patient identified, Emergency Drugs available, Suction available and Patient being monitored Patient Re-evaluated:Patient Re-evaluated prior to induction Oxygen Delivery Method: Circle System Utilized Preoxygenation: Pre-oxygenation with 100% oxygen Induction Type: IV induction Ventilation: Mask ventilation without difficulty Laryngoscope Size: Mac and 3 Grade View: Grade I Tube type: Oral Tube size: 8.5 mm Number of attempts: 1 Airway Equipment and Method: Stylet Placement Confirmation: ETT inserted through vocal cords under direct vision, positive ETCO2 and breath sounds checked- equal and bilateral Secured at: 21 cm Tube secured with: Tape Dental Injury: Teeth and Oropharynx as per pre-operative assessment

## 2023-03-11 NOTE — Anesthesia Preprocedure Evaluation (Signed)
Anesthesia Evaluation  Patient identified by MRN, date of birth, ID band Patient awake    Reviewed: Allergy & Precautions, H&P , NPO status , Patient's Chart, lab work & pertinent test results  Airway Mallampati: III  TM Distance: >3 FB Neck ROM: Full    Dental  (+) Edentulous Lower, Edentulous Upper   Pulmonary  Bilateral pulmonary nodules  SOD w/ exertion, no inhalers   Pulmonary exam normal breath sounds clear to auscultation       Cardiovascular hypertension (117/62 preop), Normal cardiovascular exam Rhythm:Regular Rate:Normal     Neuro/Psych  PSYCHIATRIC DISORDERS Anxiety     negative neurological ROS     GI/Hepatic Neg liver ROS,GERD  Controlled,,  Endo/Other  negative endocrine ROS    Renal/GU Renal disease  negative genitourinary   Musculoskeletal  (+) Arthritis , Osteoarthritis,    Abdominal   Peds negative pediatric ROS (+)  Hematology negative hematology ROS (+)   Anesthesia Other Findings   Reproductive/Obstetrics negative OB ROS                             Anesthesia Physical Anesthesia Plan  ASA: 3  Anesthesia Plan: General   Post-op Pain Management: Tylenol PO (pre-op)*   Induction: Intravenous  PONV Risk Score and Plan: Ondansetron, Dexamethasone and Treatment may vary due to age or medical condition  Airway Management Planned: Oral ETT  Additional Equipment: None  Intra-op Plan:   Post-operative Plan: Extubation in OR  Informed Consent: I have reviewed the patients History and Physical, chart, labs and discussed the procedure including the risks, benefits and alternatives for the proposed anesthesia with the patient or authorized representative who has indicated his/her understanding and acceptance.     Dental advisory given  Plan Discussed with: CRNA  Anesthesia Plan Comments: (Last hb 7.3 on 11/1, will recheck when we get PIV)       Anesthesia  Quick Evaluation

## 2023-03-11 NOTE — H&P (Signed)
Diane Paul is an 82 y.o. female.   Chief Complaint: Bilateral pulmonary nodules HPI: 82 year old woman with history of breast cancer, admitted for anemia in late September.  She had a CT scan of her abdomen pelvis that showed a right renal mass concerning for renal cell carcinoma.  Her evaluation also revealed bilateral pulmonary nodules.  She underwent navigational bronchoscopy on 02/12/2023 and her left upper lobe, left lower lobe and right upper lobe nodule biopsies were negative for malignancy.  PET scan from 02/26/2023 confirmed hypermetabolism in these nodules and suspicion for possible malignancy remains.  She is here now for repeat bronchoscopy and tissue sampling.  She has had some sinus congestion and drainage, headache and is currently on amoxicillin for possible acute sinusitis.  Otherwise no changes or complaints.  Past Medical History:  Diagnosis Date   Anemia    Anxiety    nervous sometimes, denies panica ttacks    Arthritis    knees ?   Blood transfusion    post vaginal birth- 1960, 01/2023- anemia   Breast cancer (HCC)    Right Breast Cancer   Cancer (HCC)    R breast cancer   Dry mouth    GERD (gastroesophageal reflux disease)    Hypertension    Neuromuscular disorder (HCC)    L leg, thigh- "burns sometimes"   Shortness of breath    sometimes    Use of letrozole (Femara) 05/07/2010   neoadjuvant femara therapy since 1/212    Past Surgical History:  Procedure Laterality Date   ABDOMINAL HYSTERECTOMY     BIOPSY  02/03/2023   Procedure: BIOPSY;  Surgeon: Jeani Hawking, MD;  Location: [facility] ENDOSCOPY;  Service: Gastroenterology;;  gastric   BREAST LUMPECTOMY     BREAST SURGERY     Right   BRONCHIAL BIOPSY  02/12/2023   Procedure: BRONCHIAL BIOPSIES;  Surgeon: Leslye Peer, MD;  Location: [facility] ENDOSCOPY;  Service: Pulmonary;;   BRONCHIAL BRUSHINGS  02/12/2023   Procedure: BRONCHIAL BRUSHINGS;  Surgeon: Leslye Peer, MD;  Location: [facility] ENDOSCOPY;  Service:  Pulmonary;;   BRONCHIAL NEEDLE ASPIRATION BIOPSY  02/12/2023   Procedure: BRONCHIAL NEEDLE ASPIRATION BIOPSIES;  Surgeon: Leslye Peer, MD;  Location: [facility] ENDOSCOPY;  Service: Pulmonary;;   BRONCHIAL WASHINGS  02/12/2023   Procedure: BRONCHIAL WASHINGS;  Surgeon: Leslye Peer, MD;  Location: [facility] ENDOSCOPY;  Service: Pulmonary;;   COLONOSCOPY WITH PROPOFOL N/A 02/03/2023   Procedure: COLONOSCOPY WITH PROPOFOL;  Surgeon: Jeani Hawking, MD;  Location: [facility] ENDOSCOPY;  Service: Gastroenterology;  Laterality: N/A;   ESOPHAGOGASTRODUODENOSCOPY (EGD) WITH PROPOFOL N/A 02/03/2023   Procedure: ESOPHAGOGASTRODUODENOSCOPY (EGD) WITH PROPOFOL;  Surgeon: Jeani Hawking, MD;  Location: [facility] ENDOSCOPY;  Service: Gastroenterology;  Laterality: N/A;   HEMOSTASIS CLIP PLACEMENT  02/03/2023   Procedure: HEMOSTASIS CLIP PLACEMENT;  Surgeon: Jeani Hawking, MD;  Location: [facility] ENDOSCOPY;  Service: Gastroenterology;;  ascending colon polyp   jp drains     s/p masectomy 06/11/11/ Dr. [name], 2 jp drains right chest wall   MASTECTOMY Right    malignant   MASTECTOMY W/ SENTINEL NODE BIOPSY  06/11/2011/Right BReast   Procedure: MASTECTOMY WITH SENTINEL LYMPH NODE BIOPSY;  Surgeon: Ernestene Mention, MD;  Location: [facility] OR;  Service: General;  Laterality: Right;  Right total mastectomy and sentinel lymph node biopsy using lymphatic mapping and blue dye injection.   POLYPECTOMY  02/03/2023   Procedure: POLYPECTOMY;  Surgeon: Jeani Hawking, MD;  Location: [facility] ENDOSCOPY;  Service: Gastroenterology;;  hot snare polypectomy ascending colon  SUBMUCOSAL LIFTING INJECTION  02/03/2023   Procedure: SUBMUCOSAL LIFTING INJECTION;  Surgeon: Jeani Hawking, MD;  Location: [facility] ENDOSCOPY;  Service: Gastroenterology;;  ever lift 16cc ascending colon   TUBAL LIGATION     [place] FINE NEEDLE ASPIRATION WO/ W SMEAR  05/22/10   Left Breast- cyst or abscess     Family History  Problem Relation Age of Onset   Breast cancer Sister    Anesthesia problems  Neg Hx    Hypotension Neg Hx    Malignant hyperthermia Neg Hx    Pseudochol deficiency Neg Hx    Social History:  reports that she has never smoked. She has never used smokeless tobacco. She reports that she does not drink alcohol and does not use drugs.  Allergies:  Allergies  Allergen Reactions   Fosamax [Alendronate Sodium] Other (See Comments)    Aches and pains    Medications Prior to Admission  Medication Sig Dispense Refill   Iron, Ferrous Sulfate, 325 (65 Fe) MG TABS Take 325 mg by mouth daily. 30 tablet 0   pantoprazole (PROTONIX) 40 MG tablet Take 1 tablet (40 mg total) by mouth 2 (two) times daily. 60 tablet 0   amoxicillin (AMOXIL) 875 MG tablet Take 1 tablet (875 mg total) by mouth 2 (two) times daily for 10 days. 20 tablet 0   Ensure (ENSURE) Take 237 mLs by mouth.      No results found for this or any previous visit (from the past 48 hour(s)). No results found.  Review of Systems As per HPI Blood pressure 117/62, pulse (!) 105, temperature 98.9 F (37.2 C), temperature source Oral, resp. rate 18, height 5\' 8"  (1.727 m), weight 86.2 kg, SpO2 96%. Physical Exam  Gen: Pleasant, well-nourished, in no distress,  normal affect  ENT: No lesions,  mouth clear,  oropharynx clear, no postnasal drip  Neck: No JVD, no stridor  Lungs: No use of accessory muscles, no crackles or wheezing on normal respiration, no wheeze on forced expiration  Cardiovascular: RRR, heart sounds normal, no murmur or gallops, no peripheral edema  Abdomen: soft and NT, no HSM,  BS normal  Musculoskeletal: No deformities, no cyanosis or clubbing  Neuro: alert, awake, non focal  Skin: Warm, no lesions or rash  Assessment/Plan Bilateral pulmonary nodules in a patient with a history of breast cancer, new right renal mass concerning for renal cell carcinoma.  Given level of suspicion these nodules represent malignancy and the possibility that this could represent metastasis from either breast  cancer or renal cell carcinoma we have plan for repeat navigational bronchoscopy.  The patient understands the risk, benefits and rationale and agrees to proceed.  No barriers identified.  Leslye Peer, MD 03/11/2023, 7:16 AM

## 2023-03-11 NOTE — Telephone Encounter (Signed)
Thank you :)

## 2023-03-12 ENCOUNTER — Telehealth: Payer: Self-pay

## 2023-03-12 ENCOUNTER — Other Ambulatory Visit: Payer: Self-pay

## 2023-03-12 DIAGNOSIS — C641 Malignant neoplasm of right kidney, except renal pelvis: Secondary | ICD-10-CM | POA: Diagnosis not present

## 2023-03-12 DIAGNOSIS — C7802 Secondary malignant neoplasm of left lung: Secondary | ICD-10-CM | POA: Diagnosis not present

## 2023-03-12 DIAGNOSIS — J329 Chronic sinusitis, unspecified: Secondary | ICD-10-CM

## 2023-03-12 DIAGNOSIS — C7801 Secondary malignant neoplasm of right lung: Secondary | ICD-10-CM | POA: Diagnosis not present

## 2023-03-12 MED ORDER — AMOXICILLIN 875 MG PO TABS: 875.0000 mg | ORAL_TABLET | Freq: Two times a day (BID) | ORAL | 0 refills | Status: DC

## 2023-03-12 NOTE — Telephone Encounter (Signed)
Pt's daughter called in stating that this med amoxicillin (AMOXIL) 875 MG was sent to the wrong pharmacy. Pt's daughter asks if this med could be resent to this pharmacy please.  PHARMACY: [id number] [place], [place] - [address] [place] [address] Phone: [phone]  Fax: [phone] DEA #: [id number]    CB#: [phone]

## 2023-03-13 ENCOUNTER — Other Ambulatory Visit: Payer: Self-pay

## 2023-03-13 ENCOUNTER — Encounter (HOSPITAL_COMMUNITY): Payer: Self-pay | Admitting: Emergency Medicine

## 2023-03-13 DIAGNOSIS — D500 Iron deficiency anemia secondary to blood loss (chronic): Secondary | ICD-10-CM

## 2023-03-13 DIAGNOSIS — J329 Chronic sinusitis, unspecified: Secondary | ICD-10-CM

## 2023-03-13 DIAGNOSIS — D509 Iron deficiency anemia, unspecified: Secondary | ICD-10-CM

## 2023-03-13 LAB — FUNGAL ORGANISM REFLEX

## 2023-03-13 LAB — FUNGUS CULTURE WITH STAIN

## 2023-03-13 LAB — FUNGUS CULTURE RESULT

## 2023-03-13 MED ORDER — AMOXICILLIN 875 MG PO TABS: 875.0000 mg | ORAL_TABLET | Freq: Two times a day (BID) | ORAL | 0 refills | Status: AC

## 2023-03-14 LAB — CBC WITH DIFFERENTIAL/PLATELET
Absolute Lymphocytes: 1479 {cells}/uL (ref 850–3900)
Absolute Monocytes: 1870 {cells}/uL — ABNORMAL HIGH (ref 200–950)
Basophils Absolute: 34 {cells}/uL (ref 0–200)
Basophils Relative: 0.2 %
Eosinophils Absolute: 51 {cells}/uL (ref 15–500)
Eosinophils Relative: 0.3 %
HCT: 25.5 % — ABNORMAL LOW (ref 35.0–45.0)
Hemoglobin: 7.3 g/dL — ABNORMAL LOW (ref 11.7–15.5)
MCH: 21.6 pg — ABNORMAL LOW (ref 27.0–33.0)
MCHC: 28.6 g/dL — ABNORMAL LOW (ref 32.0–36.0)
MCV: 75.4 fL — ABNORMAL LOW (ref 80.0–100.0)
MPV: 9.5 fL (ref 7.5–12.5)
Monocytes Relative: 11 %
Neutro Abs: 13566 {cells}/uL — ABNORMAL HIGH (ref 1500–7800)
Neutrophils Relative %: 79.8 %
Platelets: 657 10*3/uL — ABNORMAL HIGH (ref 140–400)
RBC: 3.38 10*6/uL — ABNORMAL LOW (ref 3.80–5.10)
RDW: 18 % — ABNORMAL HIGH (ref 11.0–15.0)
Total Lymphocyte: 8.7 %
WBC: 17 10*3/uL — ABNORMAL HIGH (ref 3.8–10.8)

## 2023-03-14 LAB — BASIC METABOLIC PANEL WITH GFR
BUN: 14 mg/dL (ref 7–25)
CO2: 23 mmol/L (ref 20–32)
Calcium: 8.2 mg/dL — ABNORMAL LOW (ref 8.6–10.4)
Chloride: 98 mmol/L (ref 98–110)
Creat: 0.72 mg/dL (ref 0.60–0.95)
Glucose, Bld: 139 mg/dL — ABNORMAL HIGH (ref 65–99)
Potassium: 4.6 mmol/L (ref 3.5–5.3)
Sodium: 136 mmol/L (ref 135–146)
eGFR: 83 mL/min/{1.73_m2} (ref >=60)

## 2023-03-14 LAB — IRON,TIBC AND FERRITIN PANEL
%SAT: 9 % — ABNORMAL LOW (ref 16–45)
Ferritin: 1227 ng/mL — ABNORMAL HIGH (ref 16–288)
Iron: 10 ug/dL — ABNORMAL LOW (ref 45–160)
TIBC: 113 ug/dL — ABNORMAL LOW (ref 250–450)

## 2023-03-14 LAB — TEST AUTHORIZATION

## 2023-03-14 LAB — CYTOLOGY - NON PAP

## 2023-03-14 NOTE — Telephone Encounter (Signed)
Spoke with patient regarding prior message. Patient seemed confused. Advised patient she does have a Bronch follow up with Maralyn Sago on 03/15/23. Patient gave me son's number [phone] to call him .ATC x1 son Delton but left a Voicemail for him to call our office back and stated patient does have a f/u with sarah on 03/15/23.

## 2023-03-15 ENCOUNTER — Encounter: Payer: Self-pay | Admitting: Acute Care

## 2023-03-15 ENCOUNTER — Ambulatory Visit: Payer: Medicare HMO | Admitting: Acute Care

## 2023-03-15 VITALS — BP 126/68 | HR 110 | Ht 69.0 in | Wt 192.4 lb

## 2023-03-15 DIAGNOSIS — Z9889 Other specified postprocedural states: Secondary | ICD-10-CM | POA: Diagnosis not present

## 2023-03-15 DIAGNOSIS — Z853 Personal history of malignant neoplasm of breast: Secondary | ICD-10-CM | POA: Diagnosis not present

## 2023-03-15 DIAGNOSIS — C3480 Malignant neoplasm of overlapping sites of unspecified bronchus and lung: Secondary | ICD-10-CM

## 2023-03-15 DIAGNOSIS — N2889 Other specified disorders of kidney and ureter: Secondary | ICD-10-CM | POA: Diagnosis not present

## 2023-03-15 DIAGNOSIS — Z789 Other specified health status: Secondary | ICD-10-CM

## 2023-03-15 NOTE — Progress Notes (Signed)
History of Present Illness Diane Paul is a 82 y.o. female with past medical history of breast cancer s/p right mastectomy, hypertension, arthritis and iron deficiency anemia who was admitted for progressive anemia. She has been seen by Dr. Francine Graven as an inpatient, and Dr. Delton Coombes as an OP.    Synopsis 82 year old female never smoker with PMH right breast cancer s/p mastectomy, HTN, arthritis, iron deficiency anemia who was referred to the hospital after worsening anemia on outpatient labs. She had recently undergone evaluation with primary care on 02/01/2023 due to worsening fatigue. HGB dropped as low as 6.7, and she was transfused with 1 unit PRBC.  CT angio abdomen/pelvis was performed initially looking for possible bleeding etiology. This was negative for evidence of GI bleed but showed underlying 11 cm right kidney mass concerning for Renal cell carcinoma. This was then followed by CT chest which showed numerous bilateral pulmonary nodules concerning for metastatic lesions.  Pulmonary were  consulted for possible bronchoscopy with biopsies for definitive diagnosis of pulmonary nodules. ( Metastatic renal cell  vs metastatic breast vs primary lung). Pt underwent navigational bronchoscopy 02/12/2023 which was non -diagnostic. The biopsy was repeated 03/11/2023 with the hope of a more definitive diagnosis. She is here today for follow up after bronchoscopy to review pathology results.  Diane Paul is a 82 y.o. female with history of breast cancer and a newly identified renal mass.  She has bilateral pulmonary nodules of unclear cause.  There is concern that these nodules are metastatic cancer from the kidney. Previous navigational bronchoscopy done 02/12/2023 was nondiagnostic.  Recommendation made to repeat her procedure to achieve a tissue diagnosis. She underwent repeat bronchoscopy with biopsies on 03/11/2023 by Dr. Delton Coombes. She is here today to discuss biopsy results and also ensure she has done  well post procedure.    03/15/2023 Pt presents for follow-up after navigational bronchoscopy with biopsies.  She is here with her son and her daughter.  She states she had some scant blood in her secretions for several days after the procedure which is resolving, no fever, no discolored secretions, no adverse reaction to the anesthesia. We discussed that the second biopsy did give Diane Paul a diagnosis of non-small cell cancer in the lung. Five immunohistochemical stains are performed  with adequate control in an attempt to further characterize and  identified these atypical cells.  Patient is scheduled to see a physician at [facility] urology for what I believe is a nephrectomy of the affected kidney.  This is planned for March 27, 2023.  I believe the plan is for tissue sampling once they have remove the kidney.  I told the patient and her family that once they know the type of tissue in the kidney they can determine if it is similar to what we have seen in the lung and that this will help determine if this is a metastatic kidney cancer versus a primary lung cancer.  The patient is a never smoker.  The suspicion is that this is a metastatic renal cancer or breast cancer . Family understand that we have found a cancer in the lung and that once we have a sample of the kidney that we will help clarify whether we think this is metastatic disease.  We discussed that that would also help determine staging. Patient has an appointment with Dr. Cherly Hensen with medical oncology March 19, 2023 at 4 PM.  I explained to them that once they see oncology they will have a  better idea of staging, and possible treatment options.  We did discuss that based on the staging of the cancer options do become less, but there are usually options for treatment.  Test Results: Cytology 03/11/2023 FINAL MICROSCOPIC DIAGNOSIS:  A.  LEFT LUNG, UPPER LOBE, TARGET #2, FINE NEEDLE ASPIRATION  BIOPSY:  - Malignant  - Non-small cell carcinoma  (see comment)   B.  LEFT LUNG, UPPER LOBE, TARGET #2, BRUSHING:  - Malignant  - Non-small cell carcinoma (see comment)   COMMENT:   Both the left upper lobe FNA and brushing show scattered rare loosely  cohesive groups of large atypical cells with enlarged irregular  hyperchromatic nuclei. Five immunohistochemical stains are performed  with adequate control in an attempt to further characterize and  identified these atypical cells.  Stains for cytokeratin 7, the  pulmonary adeno markers, TTF-1, Napsin A, and the squamous markers,  cytokeratin 5/6 and p40, were all noncontributory.   Case was reviewed by Dr. Luisa Hart who concurs with the interpretation.   Cytology 02/12/2023 RIGHT LUNG, UPPER LOBE, TARGET #1, FINE NEEDLE ASPIRATION  BIOPSY:  - Atypical cells present, favor reactive bronchial cells  - Benign/reactive bronchial cells, pulmonary macrophages and  inflammatory cells  - Benign alveolated lung with focal fibrosis (cellblock)   D.  RIGHT LUNG, UPPER LOBE, TARGET #1, BRUSHING:  - Negative for malignancy  - Very scant cellularity; very rare benign bronchial cells    CT Abdomen/ Pelvis 02/01/2023 11 cm enhancing mass involving the mid to lower pole of the right kidney most compatible with renal cell carcinoma. MRI Brain 02/04/2023  shows no evidence of intracranial metastases.   CT Chest 02/01/2023 Numerous bilateral pulmonary nodules suspicious for metastatic disease. 2. Aortic atherosclerosis.     Latest Ref Rng & Units 03/11/2023    8:27 AM 03/08/2023   12:36 PM 02/15/2023   11:52 AM  CBC  WBC 3.8 - 10.8 Thousand/uL  17.0  16.1   Hemoglobin 12.0 - 15.0 g/dL 7.1  7.3  7.9   Hematocrit 36.0 - 46.0 % 21.0  25.5  26.9   Platelets 140 - 400 Thousand/uL  657  687        Latest Ref Rng & Units 03/11/2023    8:27 AM 03/08/2023   12:36 PM 02/15/2023   11:52 AM  BMP  Glucose 70 - 99 mg/dL 161  096  045   BUN 8 - 23 mg/dL 12  14  14    Creatinine 0.44 - 1.00 mg/dL 4.09   8.11  9.14   BUN/Creat Ratio 6 - 22 (calc)  SEE NOTE:  SEE NOTE:   Sodium 135 - 145 mmol/L 134  136  139   Potassium 3.5 - 5.1 mmol/L 3.9  4.6  4.2   Chloride 98 - 111 mmol/L 98  98  101   CO2 20 - 32 mmol/L  23  24   Calcium 8.6 - 10.4 mg/dL  8.2  8.5     BNP No results found for: "BNP"  ProBNP No results found for: "PROBNP"  PFT No results found for: "FEV1PRE", "FEV1POST", "FVCPRE", "FVCPOST", "TLC", "DLCOUNC", "PREFEV1FVCRT", "PSTFEV1FVCRT"  NM PET Image Initial (PI) Skull Base To Thigh  Result Date: 03/15/2023 CLINICAL DATA:  Initial treatment strategy for pulmonary nodules. EXAM: NUCLEAR MEDICINE PET SKULL BASE TO THIGH TECHNIQUE: 9.7 mCi F-18 FDG was injected intravenously. Full-ring PET imaging was performed from the skull base to thigh after the radiotracer. CT data was obtained and used for attenuation correction  and anatomic localization. Fasting blood glucose: 114 mg/dl COMPARISON:  CT chest dated 02/01/2023 FINDINGS: Mediastinal blood pool activity: SUV max 2.5 Liver activity: SUV max NA NECK: No hypermetabolic cervical lymphadenopathy. Incidental CT findings: None. CHEST: Bilateral pulmonary nodules, including an 18 mm nodule in the posterior right upper lobe (series 7/image 13) and a 15 mm nodule in the central left upper lobe (series 7/image 30), max SUV 13.9 and 8.1 respectively. These are compatible with metastatic disease. No hypermetabolic thoracic lymphadenopathy. Status post right mastectomy. Incidental CT findings: Mild atherosclerotic calcifications of the aortic arch. ABDOMEN/PELVIS: 9.3 cm right lower pole renal mass (series 4/image 117), max SUV 27.4, compatible with renal cell carcinoma. No abnormal hypermetabolic activity within the liver, pancreas, adrenal glands, or spleen. No hypermetabolic lymph nodes in the abdomen or pelvis. Incidental CT findings: Cholelithiasis, without associated inflammatory changes. Left colonic diverticulosis, without evidence of  diverticulitis. Status post hysterectomy. SKELETON: Focal hypermetabolism in the left iliac bone, without CT correlate, max SUV 20.1. Incidental CT findings: None. IMPRESSION: 9.3 cm right lower pole renal mass, compatible with renal cell carcinoma. Bilateral pulmonary metastases, as above. Suspected left iliac osseous metastasis, although without CT correlate. Electronically Signed   By: Charline Bills M.D.   On: 03/15/2023 00:12   DG C-ARM BRONCHOSCOPY  Result Date: 03/11/2023 C-ARM BRONCHOSCOPY: Fluoroscopy was utilized by the requesting physician.  No radiographic interpretation.   DG Chest Port 1 View  Result Date: 03/11/2023 CLINICAL DATA:  Status post bronchoscopy. EXAM: PORTABLE CHEST 1 VIEW COMPARISON:  02/12/2023 FINDINGS: Low volume film. The cardio pericardial silhouette is enlarged. Parahilar streaky opacity suggest atelectasis bilaterally. Small bilateral pulmonary nodules evident. No pneumothorax or evidence of pleural effusion. Telemetry leads overlie the chest. IMPRESSION: 1. Low volume film with perihilar atelectasis. No evidence for pneumothorax. 2. Small bilateral pulmonary nodules. Electronically Signed   By: Kennith Center M.D.   On: 03/11/2023 10:53     Past medical hx Past Medical History:  Diagnosis Date   Anemia    Anxiety    nervous sometimes, denies panica ttacks    Arthritis    knees ?   Blood transfusion    post vaginal birth- 1960, 01/2023- anemia   Breast cancer (HCC)    Right Breast Cancer   Cancer (HCC)    R breast cancer   Dry mouth    GERD (gastroesophageal reflux disease)    Hypertension    Neuromuscular disorder (HCC)    L leg, thigh- "burns sometimes"   Shortness of breath    sometimes    Use of letrozole (Femara) 05/07/2010   neoadjuvant femara therapy since 1/212     Social History   Tobacco Use   Smoking status: Never   Smokeless tobacco: Never  Vaping Use   Vaping status: Never Used  Substance Use Topics   Alcohol use: No   Drug  use: No    Ms.Moots reports that she has never smoked. She has never used smokeless tobacco. She reports that she does not drink alcohol and does not use drugs.  Tobacco Cessation: Never smoker   Past surgical hx, Family hx, Social hx all reviewed.  Current Outpatient Medications on File Prior to Visit  Medication Sig   amoxicillin (AMOXIL) 875 MG tablet Take 1 tablet (875 mg total) by mouth 2 (two) times daily for 10 days.   Ensure (ENSURE) Take 237 mLs by mouth.   Iron, Ferrous Sulfate, 325 (65 Fe) MG TABS Take 325 mg by mouth daily.  pantoprazole (PROTONIX) 40 MG tablet Take 1 tablet (40 mg total) by mouth 2 (two) times daily.   No current facility-administered medications on file prior to visit.     Allergies  Allergen Reactions   Fosamax [Alendronate Sodium] Other (See Comments)    Aches and pains    Review Of Systems:  Constitutional:   No  weight loss, night sweats,  Fevers, chills, fatigue, or  lassitude.  HEENT:   No headaches,  Difficulty swallowing,  Tooth/dental problems, or  Sore throat,                No sneezing, itching, ear ache, nasal congestion, post nasal drip,   CV:  No chest pain,  Orthopnea, PND, swelling in lower extremities, anasarca, dizziness, palpitations, syncope.   GI  No heartburn, indigestion, abdominal pain, nausea, vomiting, diarrhea, change in bowel habits, loss of appetite, bloody stools.   Resp: No shortness of breath with exertion or at rest.  No excess mucus, no productive cough,  No non-productive cough,  No coughing up of blood.  No change in color of mucus.  No wheezing.  No chest wall deformity  Skin: no rash or lesions.  GU: no dysuria, change in color of urine, no urgency or frequency.  No flank pain, no hematuria   MS:  No joint pain or swelling.  No decreased range of motion.  No back pain.  Psych:  No change in mood or affect. No depression or anxiety.  No memory loss.   Vital Signs BP 126/68 (BP Location: Left Arm,  Cuff Size: Large)   Pulse (!) 110   Ht 5\' 9"  (1.753 m)   Wt 192 lb 6.4 oz (87.3 kg)   SpO2 97%   BMI 28.41 kg/m    Physical Exam:  General- No distress,  A&Ox3, pleasant ENT: No sinus tenderness, TM clear, pale nasal mucosa, no oral exudate,no post nasal drip, no LAN Cardiac: S1, S2, regular rate and rhythm, no murmur Chest: No wheeze/ rales/ dullness; no accessory muscle use, no nasal flaring, no sternal retractions, slightly diminished per bases Abd.: Soft Non-tender, nondistended, bowel sounds positive,Body mass index is 28.41 kg/m.  Ext: No clubbing cyanosis, edema Neuro: Physical deconditioning, moving all extremities x 4, alert and oriented to self place and time Skin: No rashes, warm and dry, no lesions Psych: normal mood and behavior, appropriately anxious   Assessment/Plan New diagnosis non small cell cancer in the lung ? Metastatic breast/ renal vs primary lung Pt is a never smoker, most likely metastatic disease History of breast cancer SP mastectomy 11 cm right kidney mass concerning for renal cell carcinoma  Plan I am glad you have done well since the biopsy.  Your biopsy was positive for non small cell cancer. I am glad we have a diagnosis. We suspect this is a metastatic cancer from either your breast cancer, or the new kidney mass.  You have follow up with Dr. Cherly Hensen 03/19/2023 in medical Oncology at 4 pm. You are scheduled to have the kidney surgery 03/27/2023 with [facility]. Good luck with your surgery and treatment moving forward. Call Diane Paul if you need Diane Paul.  Please contact office for sooner follow up if symptoms do not improve or worsen or seek emergency care    I spent 40 minutes dedicated to the care of this patient on the date of this encounter to include pre-visit review of records, face-to-face time with the patient discussing conditions above, post visit ordering of testing, clinical documentation  with the electronic health record, making appropriate  referrals as documented, and communicating necessary information to the patient's healthcare team.    Bevelyn Ngo, NP 03/15/2023  11:11 AM

## 2023-03-15 NOTE — Patient Instructions (Addendum)
It is good to see you today. I am glad you have done well since the biopsy.  Your biopsy was positive for non small cell cancer. I am glad we have a diagnosis. You have follow up with Dr. Cherly Hensen 03/19/2023 in medical Oncology at 4 pm. You are scheduled to have the kidney surgery 03/27/2023 with [facility]. Good luck with your treatment moving forward. Call us if you need [place]. Please contact office for sooner follow up if symptoms do not improve or worsen or seek emergency care

## 2023-03-18 DIAGNOSIS — C3492 Malignant neoplasm of unspecified part of left bronchus or lung: Secondary | ICD-10-CM | POA: Insufficient documentation

## 2023-03-18 DIAGNOSIS — N2889 Other specified disorders of kidney and ureter: Secondary | ICD-10-CM | POA: Insufficient documentation

## 2023-03-18 NOTE — Progress Notes (Unsigned)
Patient Care Team: Diane Brooks, MD as PCP - General (Family Medicine)  Clinic Day:  03/19/2023  Referring physician: Donita Brooks, MD  ASSESSMENT & PLAN:   Assessment & Plan:  Diane Paul is a 82 y.o. woman with history of T1c stage Ia hormone positive breast cancer in 2013 s/p mastectomy, arthritis, and HTN admitted for symptomatic anemia in late September 2024.  During the workup found to have pulmonary nodules and right renal mass.  Initial biopsy of pulmonary nodules was negative for malignancy in October.  Repeat biopsy however showed positive for NSCLC on the left upper lobe.  Biopsy on the right nodule was negative for malignancy.  We discussed potential diagnosis today.  Her renal mass is most likely primary kidney cancer.  Multiple pulmonary nodules with 1 nodes positive for malignancy.  Given noticed NSCLC cannot rule out the other sites could be metastatic renal cell cancer.  Rarely, lung cancer can metastasize to the kidney as well.  I recommend proceed with nephrectomy at this point.  After that, consider SBRT to the primary lesion and follow-up with other sites of pulmonary nodules.  I will also request foundation 1 testing from her primary lung cancer.    NSCLC of left lung (HCC) Biopsy proven LUL NSCLC  Right renal mass Given biopsy of LUL showed NSCLC. Recommend surgical resection to confirm diagnosis of renal mass    The patient understands the plans discussed today and is in agreement with them.  She knows to contact our office if she develops concerns prior to her next appointment.  I provided *** minutes of face-to-face time during this encounter and > 50% was spent counseling as documented under my assessment and plan.    Diane Sartorius, MD  [facility] - A DEPT OF [facility] [address] [place] [address] Dept: [phone] Dept Fax: [phone]   No orders of the  defined types were placed in this encounter.     CHIEF COMPLAINT:  CC: ***  Current Treatment:  ***  INTERVAL HISTORY:  Diane Paul is here today to discuss recent biopsy.  I have reviewed the past medical history, past surgical history, social history and family history with the patient and they are unchanged from previous note.  02/01/23 CT chest:  Lungs/Pleura: Lungs demonstrate no acute airspace disease or pleural effusion. Numerous bilateral pulmonary nodules. The largest lesions will be measured. Right anterior upper lobe pulmonary nodule measuring 1.4 x 1.2 cm on series 4 image 45. Posterior right upper lobe pulmonary nodule measuring 14 x 10 mm on series 4, image 54. Left upper lobe posterior pulmonary nodule measuring 1.2 x 1.2 cm on series 4, image 76. Multiple left lower lobe pulmonary nodules including 10 x 9 mm nodule on series 4, image 89 and subpleural left lung base pulmonary nodule measuring 12 by 7 mm on series 4, image 105. Multiple additional bilateral pulmonary nodules are noted.  02/04/23 MRI brain: No evidence of intracranial metastases.  02/26/23 PET CHEST: Bilateral pulmonary nodules, including an 18 mm nodule in the posterior right upper lobe (series 7/image 13) and a 15 mm nodule in the central left upper lobe (series 7/image 30), max SUV 13.9 and 8.1 respectively. These are compatible with metastatic disease.   ABDOMEN/PELVIS: 9.3 cm right lower pole renal mass (series 4/image 117), max SUV 27.4, compatible with renal cell carcinoma.  SKELETON: Focal hypermetabolism in the left iliac bone, without CT  correlate, max SUV 20.1.   03/11/23 FINAL MICROSCOPIC DIAGNOSIS:  A.  LEFT LUNG, UPPER LOBE, TARGET #2, FINE NEEDLE ASPIRATION  BIOPSY:  - Malignant  - Non-small cell carcinoma (see comment)   B.  LEFT LUNG, UPPER LOBE, TARGET #2, BRUSHING:  - Malignant  - Non-small cell carcinoma (see comment)   C.  RIGHT LUNG, UPPER LOBE, TARGET #1, FINE NEEDLE  ASPIRATION  BIOPSY:  - Atypical cells present, favor reactive bronchial cells  - Benign/reactive bronchial cells, pulmonary macrophages and  inflammatory cells  - Benign alveolated lung with focal fibrosis (cellblock)   D.  RIGHT LUNG, UPPER LOBE, TARGET #1, BRUSHING:  - Negative for malignancy  - Very scant cellularity; very rare benign bronchial cells    ALLERGIES:  is allergic to fosamax [alendronate sodium].  MEDICATIONS:  Current Outpatient Medications  Medication Sig Dispense Refill   amoxicillin (AMOXIL) 875 MG tablet Take 1 tablet (875 mg total) by mouth 2 (two) times daily for 10 days. 20 tablet 0   Ensure (ENSURE) Take 237 mLs by mouth.     Iron, Ferrous Sulfate, 325 (65 Fe) MG TABS Take 325 mg by mouth daily. 30 tablet 0   pantoprazole (PROTONIX) 40 MG tablet Take 1 tablet (40 mg total) by mouth 2 (two) times daily. 60 tablet 0   No current facility-administered medications for this visit.    HISTORY OF PRESENT ILLNESS:   Oncology History  Breast cancer of upper-outer quadrant of right female breast (HCC)  06/11/2011 Surgery   Right breast mastectomy with right axillary sentinel lymph node biopsy 1.3 cm invasive lobular cancer, grade 1, atypical lobular hyperplasia, focal LV I, ER 91%, PR 25%, HER-2 negative, 0/8 lymph nodes T1 cN0 stage IA   07/25/2011 -  Anti-estrogen oral therapy   Letrozole 2.5 mg daily 7 years was a plan       REVIEW OF SYSTEMS:   All relevant systems were reviewed with the patient and are negative.   VITALS:  Blood pressure 139/71, pulse (!) 123, resp. rate 18, weight 192 lb (87.1 kg), SpO2 98%.  Wt Readings from Last 3 Encounters:  03/19/23 192 lb (87.1 kg)  03/15/23 192 lb 6.4 oz (87.3 kg)  03/11/23 190 lb (86.2 kg)    Body mass index is 28.35 kg/m.  Performance status (ECOG): {CHL ONC [id number]  PHYSICAL EXAM:   GENERAL:alert, no distress and comfortable SKIN: skin color normal, no rashes  EYES: normal, sclera  clear OROPHARYNX: no exudate, no erythema    NECK: supple,  non-tender, without nodularity LYMPH:  no palpable cervical lymphadenopathy LUNGS: clear to auscultation with normal breathing effort.  No wheeze or rales HEART: regular rate & rhythm and no murmurs and no lower extremity edema ABDOMEN: abdomen soft, non-tender and nondistended Musculoskeletal: no edema NEURO: alert, fluent speech, no focal motor/sensory deficits.  Strength and sensation equal bilaterally.  LABORATORY DATA:  I have reviewed the data as listed    Component Value Date/Time   NA 134 (L) 03/11/2023 0827   NA 141 08/05/2015 0952   K 3.9 03/11/2023 0827   K 4.1 08/05/2015 0952   CL 98 03/11/2023 0827   CL 108 (H) 09/19/2012 1156   CO2 23 03/08/2023 1236   CO2 24 08/05/2015 0952   GLUCOSE 118 (H) 03/11/2023 0827   GLUCOSE 109 08/05/2015 0952   GLUCOSE 101 (H) 09/19/2012 1156   BUN 12 03/11/2023 0827   BUN 20.9 08/05/2015 0952   CREATININE 0.80 03/11/2023 0827   CREATININE  0.72 03/08/2023 1236   CREATININE 1.1 08/05/2015 0952   CALCIUM 8.2 (L) 03/08/2023 1236   CALCIUM 9.4 08/05/2015 0952   PROT 6.4 02/15/2023 1152   PROT 7.8 08/05/2015 0952   ALBUMIN 1.9 (L) 02/01/2023 1515   ALBUMIN 3.7 08/05/2015 0952   AST 18 02/15/2023 1152   AST 16 08/05/2015 0952   ALT 26 02/15/2023 1152   ALT 17 08/05/2015 0952   ALKPHOS 80 02/01/2023 1515   ALKPHOS 104 08/05/2015 0952   BILITOT 0.4 02/15/2023 1152   BILITOT <0.30 08/05/2015 0952   GFRNONAA >60 02/03/2023 0849   GFRNONAA 39 (L) 08/12/2020 1502   GFRAA 45 (L) 08/12/2020 1502    No results found for: "SPEP", "UPEP"  Lab Results  Component Value Date   WBC 17.0 (H) 03/08/2023   NEUTROABS 13,566 (H) 03/08/2023   HGB 7.1 (L) 03/11/2023   HCT 21.0 (L) 03/11/2023   MCV 75.4 (L) 03/08/2023   PLT 657 (H) 03/08/2023      Chemistry      Component Value Date/Time   NA 134 (L) 03/11/2023 0827   NA 141 08/05/2015 0952   K 3.9 03/11/2023 0827   K 4.1  08/05/2015 0952   CL 98 03/11/2023 0827   CL 108 (H) 09/19/2012 1156   CO2 23 03/08/2023 1236   CO2 24 08/05/2015 0952   BUN 12 03/11/2023 0827   BUN 20.9 08/05/2015 0952   CREATININE 0.80 03/11/2023 0827   CREATININE 0.72 03/08/2023 1236   CREATININE 1.1 08/05/2015 0952      Component Value Date/Time   CALCIUM 8.2 (L) 03/08/2023 1236   CALCIUM 9.4 08/05/2015 0952   ALKPHOS 80 02/01/2023 1515   ALKPHOS 104 08/05/2015 0952   AST 18 02/15/2023 1152   AST 16 08/05/2015 0952   ALT 26 02/15/2023 1152   ALT 17 08/05/2015 0952   BILITOT 0.4 02/15/2023 1152   BILITOT <0.30 08/05/2015 0952       RADIOGRAPHIC STUDIES: I have personally reviewed the radiological images as listed and agreed with the findings in the report. NM PET Image Initial (PI) Skull Base To Thigh  Result Date: 03/15/2023 CLINICAL DATA:  Initial treatment strategy for pulmonary nodules. EXAM: NUCLEAR MEDICINE PET SKULL BASE TO THIGH TECHNIQUE: 9.7 mCi F-18 FDG was injected intravenously. Full-ring PET imaging was performed from the skull base to thigh after the radiotracer. CT data was obtained and used for attenuation correction and anatomic localization. Fasting blood glucose: 114 mg/dl COMPARISON:  CT chest dated 02/01/2023 FINDINGS: Mediastinal blood pool activity: SUV max 2.5 Liver activity: SUV max NA NECK: No hypermetabolic cervical lymphadenopathy. Incidental CT findings: None. CHEST: Bilateral pulmonary nodules, including an 18 mm nodule in the posterior right upper lobe (series 7/image 13) and a 15 mm nodule in the central left upper lobe (series 7/image 30), max SUV 13.9 and 8.1 respectively. These are compatible with metastatic disease. No hypermetabolic thoracic lymphadenopathy. Status post right mastectomy. Incidental CT findings: Mild atherosclerotic calcifications of the aortic arch. ABDOMEN/PELVIS: 9.3 cm right lower pole renal mass (series 4/image 117), max SUV 27.4, compatible with renal cell carcinoma. No  abnormal hypermetabolic activity within the liver, pancreas, adrenal glands, or spleen. No hypermetabolic lymph nodes in the abdomen or pelvis. Incidental CT findings: Cholelithiasis, without associated inflammatory changes. Left colonic diverticulosis, without evidence of diverticulitis. Status post hysterectomy. SKELETON: Focal hypermetabolism in the left iliac bone, without CT correlate, max SUV 20.1. Incidental CT findings: None. IMPRESSION: 9.3 cm right lower pole renal mass,  compatible with renal cell carcinoma. Bilateral pulmonary metastases, as above. Suspected left iliac osseous metastasis, although without CT correlate. Electronically Signed   By: Charline Bills M.D.   On: 03/15/2023 00:12   DG C-ARM BRONCHOSCOPY  Result Date: 03/11/2023 C-ARM BRONCHOSCOPY: Fluoroscopy was utilized by the requesting physician.  No radiographic interpretation.   DG Chest Port 1 View  Result Date: 03/11/2023 CLINICAL DATA:  Status post bronchoscopy. EXAM: PORTABLE CHEST 1 VIEW COMPARISON:  02/12/2023 FINDINGS: Low volume film. The cardio pericardial silhouette is enlarged. Parahilar streaky opacity suggest atelectasis bilaterally. Small bilateral pulmonary nodules evident. No pneumothorax or evidence of pleural effusion. Telemetry leads overlie the chest. IMPRESSION: 1. Low volume film with perihilar atelectasis. No evidence for pneumothorax. 2. Small bilateral pulmonary nodules. Electronically Signed   By: Kennith Center M.D.   On: 03/11/2023 10:53

## 2023-03-18 NOTE — Assessment & Plan Note (Signed)
Biopsy proven LUL NSCLC

## 2023-03-18 NOTE — Assessment & Plan Note (Signed)
Given biopsy of LUL showed NSCLC. Recommend surgical resection to confirm diagnosis of renal mass

## 2023-03-19 ENCOUNTER — Inpatient Hospital Stay: Payer: Medicare HMO

## 2023-03-19 ENCOUNTER — Encounter: Payer: Self-pay | Admitting: Adult Health

## 2023-03-19 VITALS — BP 139/71 | HR 112 | Resp 18 | Wt 192.0 lb

## 2023-03-19 DIAGNOSIS — D509 Iron deficiency anemia, unspecified: Secondary | ICD-10-CM

## 2023-03-19 DIAGNOSIS — C3412 Malignant neoplasm of upper lobe, left bronchus or lung: Secondary | ICD-10-CM | POA: Insufficient documentation

## 2023-03-19 DIAGNOSIS — C3492 Malignant neoplasm of unspecified part of left bronchus or lung: Secondary | ICD-10-CM | POA: Diagnosis not present

## 2023-03-19 DIAGNOSIS — N2889 Other specified disorders of kidney and ureter: Secondary | ICD-10-CM | POA: Diagnosis not present

## 2023-03-19 DIAGNOSIS — C3411 Malignant neoplasm of upper lobe, right bronchus or lung: Secondary | ICD-10-CM | POA: Insufficient documentation

## 2023-03-19 NOTE — Assessment & Plan Note (Signed)
Ineffective erythropoiesis with active malignancy and large renal mass. Recommend proceed with nephrectomy. PRBC if hemoglobin less than 7

## 2023-03-21 ENCOUNTER — Encounter: Payer: Self-pay | Admitting: Adult Health

## 2023-03-21 ENCOUNTER — Other Ambulatory Visit: Payer: Self-pay

## 2023-03-21 ENCOUNTER — Other Ambulatory Visit: Payer: Self-pay | Admitting: Urology

## 2023-03-22 ENCOUNTER — Encounter: Payer: Self-pay | Admitting: Adult Health

## 2023-03-22 ENCOUNTER — Telehealth: Payer: Self-pay

## 2023-03-22 ENCOUNTER — Telehealth: Payer: Self-pay | Admitting: *Deleted

## 2023-03-22 ENCOUNTER — Other Ambulatory Visit: Payer: Self-pay | Admitting: *Deleted

## 2023-03-22 ENCOUNTER — Other Ambulatory Visit: Payer: Self-pay

## 2023-03-22 ENCOUNTER — Inpatient Hospital Stay: Payer: Medicare HMO

## 2023-03-22 DIAGNOSIS — D509 Iron deficiency anemia, unspecified: Secondary | ICD-10-CM

## 2023-03-22 DIAGNOSIS — N2889 Other specified disorders of kidney and ureter: Secondary | ICD-10-CM | POA: Diagnosis not present

## 2023-03-22 DIAGNOSIS — R918 Other nonspecific abnormal finding of lung field: Secondary | ICD-10-CM

## 2023-03-22 DIAGNOSIS — C3412 Malignant neoplasm of upper lobe, left bronchus or lung: Secondary | ICD-10-CM | POA: Diagnosis not present

## 2023-03-22 DIAGNOSIS — C3411 Malignant neoplasm of upper lobe, right bronchus or lung: Secondary | ICD-10-CM | POA: Diagnosis not present

## 2023-03-22 LAB — CBC WITH DIFFERENTIAL (CANCER CENTER ONLY)
Abs Immature Granulocytes: 0.23 10*3/uL — ABNORMAL HIGH (ref 0.00–0.07)
Basophils Absolute: 0.1 10*3/uL (ref 0.0–0.1)
Basophils Relative: 0 %
Eosinophils Absolute: 0.1 10*3/uL (ref 0.0–0.5)
Eosinophils Relative: 0 %
HCT: 22.6 % — ABNORMAL LOW (ref 36.0–46.0)
Hemoglobin: 6.6 g/dL — CL (ref 12.0–15.0)
Immature Granulocytes: 1 %
Lymphocytes Relative: 8 %
Lymphs Abs: 1.7 10*3/uL (ref 0.7–4.0)
MCH: 21.6 pg — ABNORMAL LOW (ref 26.0–34.0)
MCHC: 29.2 g/dL — ABNORMAL LOW (ref 30.0–36.0)
MCV: 73.9 fL — ABNORMAL LOW (ref 80.0–100.0)
Monocytes Absolute: 2.2 10*3/uL — ABNORMAL HIGH (ref 0.1–1.0)
Monocytes Relative: 10 %
Neutro Abs: 17.6 10*3/uL — ABNORMAL HIGH (ref 1.7–7.7)
Neutrophils Relative %: 81 %
Platelet Count: 574 10*3/uL — ABNORMAL HIGH (ref 150–400)
RBC: 3.06 MIL/uL — ABNORMAL LOW (ref 3.87–5.11)
RDW: 20.5 % — ABNORMAL HIGH (ref 11.5–15.5)
WBC Count: 21.9 10*3/uL — ABNORMAL HIGH (ref 4.0–10.5)
nRBC: 0 % (ref 0.0–0.2)

## 2023-03-22 LAB — SAMPLE TO BLOOD BANK

## 2023-03-22 LAB — FOLATE: Folate: 6.7 ng/mL (ref >5.9)

## 2023-03-22 LAB — VITAMIN B12: Vitamin B-12: 653 pg/mL (ref 180–914)

## 2023-03-22 LAB — PREPARE RBC (CROSSMATCH)

## 2023-03-22 NOTE — Telephone Encounter (Signed)
Notified that she will receive 1 unit of blood tomorrow. Reminded to keep on blue bracelet. Also told son that she will be receiving IV iron at [facility] infusion center. They will call with an appt time.

## 2023-03-22 NOTE — Addendum Note (Signed)
Addended by: Geanie Berlin on: 03/22/2023 02:05 PM   Modules accepted: Orders

## 2023-03-22 NOTE — Progress Notes (Signed)
The proposed treatment discussed in conference is for discussion purpose only and is not a binding recommendation.  The patients have not been physically examined, or presented with their treatment options.  Therefore, final treatment plans cannot be decided.  

## 2023-03-22 NOTE — Telephone Encounter (Signed)
Dr. Cherly Hensen, patient will be scheduled as soon as possible.  Auth Submission: NO AUTH NEEDED Site of care: Site of care: [facility] Payer: [employer] Medication & CPT/J Code(s) submitted: Venofer (Iron Sucrose) J1756 Route of submission (phone, fax, portal):  Phone # Fax # Auth type: Buy/Bill PB Units/visits requested: 200mg  x 5 doses Reference number:  Approval from: 03/22/23 to 05/07/23

## 2023-03-22 NOTE — Progress Notes (Signed)
Cased discussed at lung TB.  CT biopsy on both the larger right and left lower lobe nodules.

## 2023-03-22 NOTE — Progress Notes (Signed)
Transfusion of pRBC ordered. Patient agrees with transfusion.

## 2023-03-22 NOTE — Progress Notes (Signed)
IV iron ordered for iron deficiency. Can complete 1 dose before end of the month and rest can be after surgery.

## 2023-03-23 ENCOUNTER — Inpatient Hospital Stay: Payer: Medicare HMO

## 2023-03-23 DIAGNOSIS — N2889 Other specified disorders of kidney and ureter: Secondary | ICD-10-CM | POA: Diagnosis not present

## 2023-03-23 DIAGNOSIS — C3412 Malignant neoplasm of upper lobe, left bronchus or lung: Secondary | ICD-10-CM | POA: Diagnosis not present

## 2023-03-23 DIAGNOSIS — C3411 Malignant neoplasm of upper lobe, right bronchus or lung: Secondary | ICD-10-CM | POA: Diagnosis not present

## 2023-03-23 DIAGNOSIS — D509 Iron deficiency anemia, unspecified: Secondary | ICD-10-CM

## 2023-03-23 MED ORDER — SODIUM CHLORIDE 0.9% IV SOLUTION
250.0000 mL | INTRAVENOUS | Status: DC
  Administered 2023-03-23: 100 mL via INTRAVENOUS

## 2023-03-23 NOTE — Patient Instructions (Signed)

## 2023-03-25 ENCOUNTER — Telehealth: Payer: Self-pay

## 2023-03-25 ENCOUNTER — Inpatient Hospital Stay: Payer: Medicare HMO

## 2023-03-25 LAB — BPAM RBC
Blood Product Expiration Date: 202412092359
ISSUE DATE / TIME: 202411160817
Unit Type and Rh: 6200

## 2023-03-25 LAB — TYPE AND SCREEN
ABO/RH(D): A POS
Antibody Screen: NEGATIVE
Unit division: 0

## 2023-03-25 NOTE — Progress Notes (Signed)
Diane Lack, MD  [name] PROCEDURE / BIOPSY REVIEW Date: 03/25/23  Requested Biopsy site: RUL lung nodule Reason for request: Renal mass, history of breast CA, mixed bronchoscopic bx results Imaging review: Best seen on PET and CT  Decision: Approved Imaging modality to perform: Ultrasound and CT Schedule with: Moderate Sedation Schedule for: Any VIR  Additional comments: @VIR : I spoke w/ Dr. Cherly Hensen and told him we would only do the largest, posterior RUL nodule and he was fine with that. He initially wanted bilateral lung nodule biopsy. @Schedulers .  Please contact me with questions, concerns, or if issue pertaining to this request arise.  Reola Calkins, MD [facility]       Previous Messages    ----- Message ----- From: [name] Sent: 03/22/2023   9:13 AM EST To: Caroleen Hamman, NT; [name]; * Subject: Ct lung mass biopsy                            Procedure : Ct lung mass biopsy  Reason: CT biopsy on both right lower and left lower larger nodules. discussed in lung cancer tumor board on 11/15 Dx: Lung nodules [R91.8 (ICD-10-CM)]    History : PET scan skull base to thigh ,NM bone scan whole body , CT chest w/ , CTA abd pelv w/wo, MRI Brain , Xrays  Provider: [name], MD  Provider contact : [phone]

## 2023-03-25 NOTE — Progress Notes (Signed)
[facility] Clinical Social Work  Initial Assessment   Diane Paul is a 82 y.o. year old female contacted caregiver by phone. Clinical Social Work was referred by new patient protocol for assessment of psychosocial needs.   SDOH (Social Determinants of Health) assessments performed: Yes SDOH Interventions    Flowsheet Row Clinical [facility] [place] Clinical Support from 07/25/2022 in [facility] Clinical Support from 07/07/2021 in [facility]  SDOH Interventions     Food Insecurity Interventions -- Intervention Not Indicated Intervention Not Indicated  Housing Interventions Intervention Not Indicated Intervention Not Indicated Intervention Not Indicated  Transportation Interventions -- Intervention Not Indicated Intervention Not Indicated  Utilities Interventions -- Intervention Not Indicated --  Alcohol Usage Interventions -- Intervention Not Indicated (Score <7) --  Financial Strain Interventions Intervention Not Indicated Intervention Not Indicated Intervention Not Indicated  Physical Activity Interventions -- Intervention Not Indicated Other (Comments)  [Walks to mailbox and works in garden. Encouraged pt to walk more at tolerated.]  Stress Interventions -- Intervention Not Indicated Intervention Not Indicated  Social Connections Interventions -- Intervention Not Indicated Intervention Not Indicated       SDOH Screenings   Food Insecurity: No Food Insecurity (03/25/2023)  Housing: Patient Declined (03/25/2023)  Transportation Needs: No Transportation Needs (03/25/2023)  Utilities: Not At Risk (03/25/2023)  Alcohol Screen: Low Risk  (07/25/2022)  Depression (PHQ2-9): Low Risk  (03/25/2023)  Financial Resource Strain: Low Risk  (03/25/2023)  Physical Activity: Sufficiently Active (07/25/2022)  Social Connections: Socially Isolated (07/25/2022)  Stress: No Stress  Concern Present (07/25/2022)  Tobacco Use: Low Risk  (03/15/2023)     Distress Screen completed: No    03/19/2023    4:00 PM  ONCBCN DISTRESS SCREENING  Screening Type Initial Screening  Distress experienced in past week (1-10) 1  Information Concerns Type Lack of info about diagnosis      Family/Social Information:  Housing Arrangement: patient lives with her sister and her daughter. Family members/support persons in your life? Family and Friends Transportation concerns: no  Employment: Retired .  Income source: Audiological scientist Income Financial concerns: No Type of concern: None Food access concerns: no Religious or spiritual practice: Not known Services Currently in place:  Family Members, Insurance, Home  Coping/ Adjustment to diagnosis: Patient understands treatment plan and what happens next? Patient's daughter had questions about upcoming Lung Biopsy. Concerns about diagnosis and/or treatment: I'm not especially worried about anything Patient reported stressors: Adjusting to my illness Hopes and/or priorities: To get through treatment Patient enjoys time with family/ friends Current coping skills/ strengths: Supportive family/friends     SUMMARY: Current SDOH Barriers:  No identified SDOH barriers at this time.  Clinical Social Work Clinical Goal(s):  No clinical social work goals at this time  Interventions: Discussed common feeling and emotions when being diagnosed with cancer, and the importance of support during treatment Informed patient of the support team roles and support services at [facility] Provided CSW contact information and encouraged patient to call with any questions or concerns    Follow Up Plan: Patient will contact CSW with any support or resource needs Patient verbalizes understanding of plan: Yes  Marguerita Merles, LCSW Clinical Social Worker [facility]

## 2023-03-25 NOTE — Telephone Encounter (Signed)
[facility] Clinical Social Work  Clinical Social Work was referred by new patient protocol for assessment of psychosocial needs.  Clinical Social Worker attempted to contact patient by phone to offer support and assess for needs.   No answer, left vm with direct contact information.      Marguerita Merles, LCSW Clinical Social Worker [facility]

## 2023-03-26 ENCOUNTER — Telehealth: Payer: Self-pay

## 2023-03-26 DIAGNOSIS — D500 Iron deficiency anemia secondary to blood loss (chronic): Secondary | ICD-10-CM

## 2023-03-26 NOTE — Telephone Encounter (Signed)
Called and spoke to daughter Zachery Dauer and explain the tumor board discussion and reason for biopsy. Let her know nephrectomy should be first priority. She understand.  She will also see me after nephrectomy.  Aram Beecham  Please schedule labs on 12/5 and see me at 12:30. Purpose to check to see if she may need transfusion and iron.  CBC, sample to blood bank, iron panel and CMP ordered.

## 2023-03-27 NOTE — Progress Notes (Addendum)
RUE restriction  COVID Vaccine Completed: yes  Date of COVID positive in last 90 days:  PCP - Lynnea Ferrier, MD Cardiologist -  Oncologist- Geanie Berlin, MD  Chest x-ray - 03/11/23 Epic EKG - 03/29/23 Epic/chart Stress Test - n/a ECHO - n/a Cardiac Cath - n/a Pacemaker/ICD device last checked: n/a Spinal Cord Stimulator: n/a  Bowel Prep - yes clears day before  Sleep Study - n/a CPAP -   Fasting Blood Sugar - n/a Checks Blood Sugar _____ times a day  Last dose of GLP1 agonist-  N/A GLP1 instructions:  Hold 7 days before surgery    Last dose of SGLT-2 inhibitors-  N/A SGLT-2 instructions:  Hold 3 days before surgery    Blood Thinner Instructions:  n/a Aspirin Instructions: Last Dose:  Activity level: Can go up a flight of stairs and perform activities of daily living without stopping and without symptoms of chest pain. SOB with activity  Anesthesia review: HTN, malignant neoplasm bilat lungs, SOB, Hgb 7.5  Patient denies shortness of breath, fever, cough and chest pain at PAT appointment  Patient verbalized understanding of instructions that were given to them at the PAT appointment. Patient was also instructed that they will need to review over the PAT instructions again at home before surgery.

## 2023-03-27 NOTE — Patient Instructions (Addendum)
SURGICAL WAITING ROOM VISITATION  Patients having surgery or a procedure may have no more than 2 support people in the waiting area - these visitors may rotate.    Children under the age of 20 must have an adult with them who is not the patient.  Due to an increase in RSV and influenza rates and associated hospitalizations, children ages 86 and under may not visit patients in [facility] hospitals.  If the patient needs to stay at the hospital during part of their recovery, the visitor guidelines for inpatient rooms apply. Pre-op nurse will coordinate an appropriate time for 1 support person to accompany patient in pre-op.  This support person may not rotate.    Please refer to the [facility] website for the visitor guidelines for Inpatients (after your surgery is over and you are in a regular room).    Your procedure is scheduled on: 04/03/23   Report to [facility] Main Entrance    Report to admitting at 9:45 AM   Call this number if you have problems the morning of surgery [phone]   Follow a clear liquid day before surgery.   Water Non-Citrus Juices (without pulp, NO RED-Apple, White grape, White cranberry) Black Coffee (NO MILK/CREAM OR CREAMERS, sugar ok)  Clear Tea (NO MILK/CREAM OR CREAMERS, sugar ok) regular and decaf                             Plain Jell-O (NO RED)                                           Fruit ices (not with fruit pulp, NO RED)                                     Popsicles (NO RED)                                                               Sports drinks like Gatorade (NO RED)              Nothing to drink after midnight          If you have questions, please contact your surgeon's office.   FOLLOW BOWEL PREP AND ANY ADDITIONAL PRE OP INSTRUCTIONS YOU RECEIVED FROM YOUR SURGEON'S OFFICE!!!     Oral Hygiene is also important to reduce your risk of infection.                                    Remember - BRUSH YOUR TEETH THE MORNING  OF SURGERY WITH YOUR REGULAR TOOTHPASTE  DENTURES WILL BE REMOVED PRIOR TO SURGERY PLEASE DO NOT APPLY "Poly grip" OR ADHESIVES!!!   Stop all vitamins and herbal supplements 7 days before surgery.   Take these medicines the morning of surgery with A SIP OF WATER: None  You may not have any metal on your body including hair pins, jewelry, and body piercing             Do not wear make-up, lotions, powders, perfumes, or deodorant  Do not wear nail polish including gel and S&S, artificial/acrylic nails, or any other type of covering on natural nails including finger and toenails. If you have artificial nails, gel coating, etc. that needs to be removed by a nail salon please have this removed prior to surgery or surgery may need to be canceled/ delayed if the surgeon/ anesthesia feels like they are unable to be safely monitored.   Do not shave  48 hours prior to surgery.    Do not bring valuables to the hospital. [facility] IS NOT             RESPONSIBLE   FOR VALUABLES.   Contacts, glasses, dentures or bridgework may not be worn into surgery.   Bring small overnight bag day of surgery.   DO NOT BRING YOUR HOME MEDICATIONS TO THE HOSPITAL. PHARMACY WILL DISPENSE MEDICATIONS LISTED ON YOUR MEDICATION LIST TO YOU DURING YOUR ADMISSION IN THE HOSPITAL!              Please read over the following fact sheets you were given: IF YOU HAVE QUESTIONS ABOUT YOUR PRE-OP INSTRUCTIONS PLEASE CALL [phone] Contras    If you received a COVID test during your pre-op visit  it is requested that you wear a mask when out in public, stay away from anyone that may not be feeling well and notify your surgeon if you develop symptoms. If you test positive for Covid or have been in contact with anyone that has tested positive in the last 10 days please notify you surgeon.    [facility] - Preparing for Surgery Before surgery, you can play an important role.  Because skin is not  sterile, your skin needs to be as free of germs as possible.  You can reduce the number of germs on your skin by washing with CHG (chlorahexidine gluconate) soap before surgery.  CHG is an antiseptic cleaner which kills germs and bonds with the skin to continue killing germs even after washing. Please DO NOT use if you have an allergy to CHG or antibacterial soaps.  If your skin becomes reddened/irritated stop using the CHG and inform your nurse when you arrive at Short Stay. Do not shave (including legs and underarms) for at least 48 hours prior to the first CHG shower.  You may shave your face/neck.  Please follow these instructions carefully:  1.  Shower with CHG Soap the night before surgery and the  morning of surgery.  2.  If you choose to wash your hair, wash your hair first as usual with your normal  shampoo.  3.  After you shampoo, rinse your hair and body thoroughly to remove the shampoo.                             4.  Use CHG as you would any other liquid soap.  You can apply chg directly to the skin and wash.  Gently with a scrungie or clean washcloth.  5.  Apply the CHG Soap to your body ONLY FROM THE NECK DOWN.   Do   not use on face/ open  Wound or open sores. Avoid contact with eyes, ears mouth and   genitals (private parts).                       Wash face,  Genitals (private parts) with your normal soap.             6.  Wash thoroughly, paying special attention to the area where your    surgery  will be performed.  7.  Thoroughly rinse your body with warm water from the neck down.  8.  DO NOT shower/wash with your normal soap after using and rinsing off the CHG Soap.                9.  Pat yourself dry with a clean towel.            10.  Wear clean pajamas.            11.  Place clean sheets on your bed the night of your first shower and do not  sleep with pets. Day of Surgery : Do not apply any lotions/deodorants the morning of surgery.  Please wear  clean clothes to the hospital/surgery center.  FAILURE TO FOLLOW THESE INSTRUCTIONS MAY RESULT IN THE CANCELLATION OF YOUR SURGERY  PATIENT SIGNATURE_________________________________  NURSE SIGNATURE__________________________________  ________________________________________________________________________

## 2023-03-28 ENCOUNTER — Ambulatory Visit: Payer: Medicare HMO

## 2023-03-28 VITALS — BP 102/61 | HR 92 | Temp 97.5°F | Resp 20 | Ht 67.0 in | Wt 195.4 lb

## 2023-03-28 DIAGNOSIS — D509 Iron deficiency anemia, unspecified: Secondary | ICD-10-CM

## 2023-03-28 MED ORDER — DIPHENHYDRAMINE HCL 25 MG PO CAPS
25.0000 mg | ORAL_CAPSULE | Freq: Once | ORAL | Status: AC
  Administered 2023-03-28: 25 mg via ORAL

## 2023-03-28 MED ORDER — IRON SUCROSE 20 MG/ML IV SOLN
200.0000 mg | Freq: Once | INTRAVENOUS | Status: AC
  Administered 2023-03-28: 200 mg via INTRAVENOUS
  Filled 2023-03-28: qty 10

## 2023-03-28 MED ORDER — ACETAMINOPHEN 325 MG PO TABS
650.0000 mg | ORAL_TABLET | Freq: Once | ORAL | Status: AC
  Administered 2023-03-28: 650 mg via ORAL

## 2023-03-28 NOTE — Progress Notes (Signed)
Diagnosis: Acute Anemia  Provider:  Chilton Greathouse MD  Procedure: IV Push  IV Type: Peripheral, IV Location: L Antecubital  Venofer (Iron Sucrose), Dose: 200 mg  Post Infusion IV Care: Observation period completed and Peripheral IV Discontinued  Discharge: Condition: Good, Destination: Home . AVS Provided  Performed by:  Nat Math, RN

## 2023-03-29 ENCOUNTER — Other Ambulatory Visit: Payer: Self-pay

## 2023-03-29 ENCOUNTER — Encounter (HOSPITAL_COMMUNITY): Payer: Self-pay

## 2023-03-29 ENCOUNTER — Encounter (HOSPITAL_COMMUNITY)
Admission: RE | Admit: 2023-03-29 | Discharge: 2023-03-29 | Disposition: A | Discharge status: Home / Self Care | Payer: Medicare HMO | Source: Ambulatory Visit | Attending: Urology | Admitting: Urology

## 2023-03-29 VITALS — BP 129/65 | HR 124 | Temp 98.5°F | Resp 18 | Ht 67.0 in

## 2023-03-29 DIAGNOSIS — I10 Essential (primary) hypertension: Secondary | ICD-10-CM | POA: Diagnosis not present

## 2023-03-29 DIAGNOSIS — Z01818 Encounter for other preprocedural examination: Secondary | ICD-10-CM

## 2023-03-29 DIAGNOSIS — Z01812 Encounter for preprocedural laboratory examination: Secondary | ICD-10-CM | POA: Diagnosis not present

## 2023-03-29 LAB — BASIC METABOLIC PANEL
Anion gap: 13 (ref 5–15)
BUN: 12 mg/dL (ref 8–23)
CO2: 23 mmol/L (ref 22–32)
Calcium: 8.3 mg/dL — ABNORMAL LOW (ref 8.9–10.3)
Chloride: 98 mmol/L (ref 98–111)
Creatinine, Ser: 0.73 mg/dL (ref 0.44–1.00)
GFR, Estimated: >60 mL/min (ref >60)
Glucose, Bld: 156 mg/dL — ABNORMAL HIGH (ref 70–99)
Potassium: 4.1 mmol/L (ref 3.5–5.1)
Sodium: 134 mmol/L — ABNORMAL LOW (ref 135–145)

## 2023-03-29 LAB — CBC
HCT: 26.9 % — ABNORMAL LOW (ref 36.0–46.0)
Hemoglobin: 7.5 g/dL — ABNORMAL LOW (ref 12.0–15.0)
MCH: 22.3 pg — ABNORMAL LOW (ref 26.0–34.0)
MCHC: 27.9 g/dL — ABNORMAL LOW (ref 30.0–36.0)
MCV: 79.8 fL — ABNORMAL LOW (ref 80.0–100.0)
Platelets: 595 10*3/uL — ABNORMAL HIGH (ref 150–400)
RBC: 3.37 MIL/uL — ABNORMAL LOW (ref 3.87–5.11)
RDW: 21.3 % — ABNORMAL HIGH (ref 11.5–15.5)
WBC: 19.3 10*3/uL — ABNORMAL HIGH (ref 4.0–10.5)
nRBC: 0 % (ref 0.0–0.2)

## 2023-03-29 LAB — ACID FAST CULTURE WITH REFLEXED SENSITIVITIES (MYCOBACTERIA): Acid Fast Culture: NEGATIVE

## 2023-03-29 NOTE — Progress Notes (Signed)
Patient needs Type and screen and 3 units of RBCs ready for DOS. She is coming back for a type and screen 04/01/23 as it is too soon after blood transfusion to do today.

## 2023-04-01 ENCOUNTER — Encounter (HOSPITAL_COMMUNITY)
Admission: RE | Admit: 2023-04-01 | Discharge: 2023-04-01 | Disposition: A | Discharge status: Home / Self Care | Payer: Medicare HMO | Source: Ambulatory Visit | Attending: Urology | Admitting: Urology

## 2023-04-01 ENCOUNTER — Other Ambulatory Visit (HOSPITAL_COMMUNITY): Payer: Medicare HMO

## 2023-04-01 DIAGNOSIS — Z01812 Encounter for preprocedural laboratory examination: Secondary | ICD-10-CM | POA: Insufficient documentation

## 2023-04-01 DIAGNOSIS — Z01818 Encounter for other preprocedural examination: Secondary | ICD-10-CM

## 2023-04-01 LAB — PREPARE RBC (CROSSMATCH)

## 2023-04-02 NOTE — Anesthesia Preprocedure Evaluation (Addendum)
Anesthesia Evaluation  Patient identified by MRN, date of birth, ID band Patient awake    Reviewed: Allergy & Precautions, NPO status , Patient's Chart, lab work & pertinent test results  History of Anesthesia Complications Negative for: history of anesthetic complications  Airway Mallampati: I  TM Distance: >3 FB Neck ROM: Full    Dental  (+) Edentulous Lower, Edentulous Upper,    Pulmonary neg pulmonary ROS, shortness of breath   Pulmonary exam normal        Cardiovascular hypertension, Pt. on medications Normal cardiovascular exam     Neuro/Psych   Anxiety      Neuromuscular disease    GI/Hepatic Neg liver ROS,GERD  ,,  Endo/Other  negative endocrine ROS    Renal/GU Renal disease     Musculoskeletal  (+) Arthritis ,    Abdominal   Peds  Hematology  (+) Blood dyscrasia, anemia   Anesthesia Other Findings Day of surgery medications reviewed with patient.  Reproductive/Obstetrics                             Anesthesia Physical Anesthesia Plan  ASA: 3  Anesthesia Plan: General   Post-op Pain Management: Minimal or no pain anticipated, Ofirmev IV (intra-op)*, Ketamine IV* and Dilaudid IV   Induction: Intravenous  PONV Risk Score and Plan: 3 and Treatment may vary due to age or medical condition, Ondansetron and Dexamethasone  Airway Management Planned: Oral ETT  Additional Equipment: None  Intra-op Plan:   Post-operative Plan: Extubation in OR  Informed Consent: I have reviewed the patients History and Physical, chart, labs and discussed the procedure including the risks, benefits and alternatives for the proposed anesthesia with the patient or authorized representative who has indicated his/her understanding and acceptance.     Dental advisory given  Plan Discussed with: CRNA and Anesthesiologist  Anesthesia Plan Comments: (2LB IV's)       Anesthesia Quick  Evaluation

## 2023-04-03 ENCOUNTER — Inpatient Hospital Stay (HOSPITAL_COMMUNITY): Payer: Self-pay | Admitting: Anesthesiology

## 2023-04-03 ENCOUNTER — Other Ambulatory Visit: Payer: Self-pay

## 2023-04-03 ENCOUNTER — Inpatient Hospital Stay (HOSPITAL_COMMUNITY): Payer: Self-pay | Admitting: Physician Assistant

## 2023-04-03 ENCOUNTER — Inpatient Hospital Stay (HOSPITAL_COMMUNITY)
Admission: RE | Admit: 2023-04-03 | Discharge: 2023-04-07 | DRG: 657 | Disposition: A | Discharge status: Home / Self Care | Payer: Medicare HMO | Source: Ambulatory Visit | Attending: Urology | Admitting: Urology

## 2023-04-03 ENCOUNTER — Encounter (HOSPITAL_COMMUNITY): Payer: Self-pay | Admitting: Urology

## 2023-04-03 ENCOUNTER — Encounter (HOSPITAL_COMMUNITY)
Admission: RE | Disposition: A | Discharge status: Home / Self Care | Payer: Self-pay | Source: Ambulatory Visit | Attending: Urology

## 2023-04-03 DIAGNOSIS — Z853 Personal history of malignant neoplasm of breast: Secondary | ICD-10-CM

## 2023-04-03 DIAGNOSIS — R531 Weakness: Secondary | ICD-10-CM | POA: Diagnosis not present

## 2023-04-03 DIAGNOSIS — D508 Other iron deficiency anemias: Secondary | ICD-10-CM | POA: Diagnosis not present

## 2023-04-03 DIAGNOSIS — N2889 Other specified disorders of kidney and ureter: Secondary | ICD-10-CM | POA: Diagnosis not present

## 2023-04-03 DIAGNOSIS — R680 Hypothermia, not associated with low environmental temperature: Secondary | ICD-10-CM | POA: Diagnosis present

## 2023-04-03 DIAGNOSIS — C641 Malignant neoplasm of right kidney, except renal pelvis: Secondary | ICD-10-CM | POA: Diagnosis not present

## 2023-04-03 DIAGNOSIS — F419 Anxiety disorder, unspecified: Secondary | ICD-10-CM

## 2023-04-03 DIAGNOSIS — Z6830 Body mass index (BMI) 30.0-30.9, adult: Secondary | ICD-10-CM

## 2023-04-03 DIAGNOSIS — Z9011 Acquired absence of right breast and nipple: Secondary | ICD-10-CM | POA: Diagnosis not present

## 2023-04-03 DIAGNOSIS — K660 Peritoneal adhesions (postprocedural) (postinfection): Secondary | ICD-10-CM | POA: Diagnosis not present

## 2023-04-03 DIAGNOSIS — Z90710 Acquired absence of both cervix and uterus: Secondary | ICD-10-CM

## 2023-04-03 DIAGNOSIS — D630 Anemia in neoplastic disease: Secondary | ICD-10-CM | POA: Diagnosis not present

## 2023-04-03 DIAGNOSIS — C7801 Secondary malignant neoplasm of right lung: Secondary | ICD-10-CM | POA: Diagnosis not present

## 2023-04-03 DIAGNOSIS — K219 Gastro-esophageal reflux disease without esophagitis: Secondary | ICD-10-CM | POA: Diagnosis not present

## 2023-04-03 DIAGNOSIS — C7802 Secondary malignant neoplasm of left lung: Secondary | ICD-10-CM | POA: Diagnosis not present

## 2023-04-03 DIAGNOSIS — I10 Essential (primary) hypertension: Secondary | ICD-10-CM | POA: Diagnosis present

## 2023-04-03 DIAGNOSIS — Z803 Family history of malignant neoplasm of breast: Secondary | ICD-10-CM | POA: Diagnosis not present

## 2023-04-03 DIAGNOSIS — E669 Obesity, unspecified: Secondary | ICD-10-CM | POA: Diagnosis not present

## 2023-04-03 DIAGNOSIS — D49511 Neoplasm of unspecified behavior of right kidney: Secondary | ICD-10-CM | POA: Diagnosis not present

## 2023-04-03 DIAGNOSIS — N280 Ischemia and infarction of kidney: Secondary | ICD-10-CM | POA: Diagnosis not present

## 2023-04-03 DIAGNOSIS — D509 Iron deficiency anemia, unspecified: Secondary | ICD-10-CM | POA: Diagnosis not present

## 2023-04-03 HISTORY — PX: ROBOT ASSISTED LAPAROSCOPIC NEPHRECTOMY: SHX5140

## 2023-04-03 LAB — POCT I-STAT 7, (LYTES, BLD GAS, ICA,H+H)
Acid-Base Excess: 1 mmol/L (ref 0.0–2.0)
Bicarbonate: 27 mmol/L (ref 20.0–28.0)
Calcium, Ion: 1.07 mmol/L — ABNORMAL LOW (ref 1.15–1.40)
HCT: 27 % — ABNORMAL LOW (ref 36.0–46.0)
Hemoglobin: 9.2 g/dL — ABNORMAL LOW (ref 12.0–15.0)
O2 Saturation: 100 %
Potassium: 4.1 mmol/L (ref 3.5–5.1)
Sodium: 135 mmol/L (ref 135–145)
TCO2: 28 mmol/L (ref 22–32)
pCO2 arterial: 49.6 mm[Hg] — ABNORMAL HIGH (ref 32–48)
pH, Arterial: 7.344 — ABNORMAL LOW (ref 7.35–7.45)
pO2, Arterial: 210 mm[Hg] — ABNORMAL HIGH (ref 83–108)

## 2023-04-03 LAB — CBC
HCT: 22.4 % — ABNORMAL LOW (ref 36.0–46.0)
Hemoglobin: 6.5 g/dL — CL (ref 12.0–15.0)
MCH: 22.6 pg — ABNORMAL LOW (ref 26.0–34.0)
MCHC: 29 g/dL — ABNORMAL LOW (ref 30.0–36.0)
MCV: 77.8 fL — ABNORMAL LOW (ref 80.0–100.0)
Platelets: 505 10*3/uL — ABNORMAL HIGH (ref 150–400)
RBC: 2.88 MIL/uL — ABNORMAL LOW (ref 3.87–5.11)
RDW: 21.3 % — ABNORMAL HIGH (ref 11.5–15.5)
WBC: 17.3 10*3/uL — ABNORMAL HIGH (ref 4.0–10.5)
nRBC: 0 % (ref 0.0–0.2)

## 2023-04-03 LAB — BASIC METABOLIC PANEL
Anion gap: 11 (ref 5–15)
BUN: 10 mg/dL (ref 8–23)
CO2: 23 mmol/L (ref 22–32)
Calcium: 8 mg/dL — ABNORMAL LOW (ref 8.9–10.3)
Chloride: 100 mmol/L (ref 98–111)
Creatinine, Ser: 0.87 mg/dL (ref 0.44–1.00)
GFR, Estimated: >60 mL/min (ref >60)
Glucose, Bld: 177 mg/dL — ABNORMAL HIGH (ref 70–99)
Potassium: 3.9 mmol/L (ref 3.5–5.1)
Sodium: 134 mmol/L — ABNORMAL LOW (ref 135–145)

## 2023-04-03 LAB — HEMOGLOBIN AND HEMATOCRIT, BLOOD
HCT: 29.7 % — ABNORMAL LOW (ref 36.0–46.0)
Hemoglobin: 9.1 g/dL — ABNORMAL LOW (ref 12.0–15.0)

## 2023-04-03 LAB — ABO/RH: ABO/RH(D): A POS

## 2023-04-03 SURGERY — NEPHRECTOMY, RADICAL, ROBOT-ASSISTED, LAPAROSCOPIC, ADULT
Anesthesia: General | Laterality: Right

## 2023-04-03 MED ORDER — SUGAMMADEX SODIUM 200 MG/2ML IV SOLN
INTRAVENOUS | Status: DC | PRN
  Administered 2023-04-03: 200 mg via INTRAVENOUS

## 2023-04-03 MED ORDER — ROCURONIUM BROMIDE 100 MG/10ML IV SOLN
INTRAVENOUS | Status: DC | PRN
  Administered 2023-04-03: 20 mg via INTRAVENOUS
  Administered 2023-04-03: 60 mg via INTRAVENOUS

## 2023-04-03 MED ORDER — PROPOFOL 10 MG/ML IV BOLUS
INTRAVENOUS | Status: DC | PRN
  Administered 2023-04-03: 200 mg via INTRAVENOUS

## 2023-04-03 MED ORDER — LACTATED RINGERS IV SOLN: INTRAVENOUS | Status: DC | PRN

## 2023-04-03 MED ORDER — KETAMINE HCL 10 MG/ML IJ SOLN
INTRAMUSCULAR | Status: DC | PRN
  Administered 2023-04-03: 20 mg via INTRAVENOUS
  Administered 2023-04-03: 10 mg via INTRAVENOUS

## 2023-04-03 MED ORDER — FENTANYL CITRATE (PF) 100 MCG/2ML IJ SOLN
INTRAMUSCULAR | Status: AC
  Filled 2023-04-03: qty 2

## 2023-04-03 MED ORDER — ACETAMINOPHEN 160 MG/5ML PO SOLN
325.0000 mg | ORAL | Status: DC | PRN
Start: 2023-04-03 — End: 2023-04-03

## 2023-04-03 MED ORDER — KCL IN DEXTROSE-NACL 20-5-0.45 MEQ/L-%-% IV SOLN
INTRAVENOUS | Status: AC
Start: 2023-04-03 — End: 2023-04-04
  Filled 2023-04-03 (×4): qty 1000

## 2023-04-03 MED ORDER — ACETAMINOPHEN 10 MG/ML IV SOLN
INTRAVENOUS | Status: DC | PRN
  Administered 2023-04-03: 1000 mg via INTRAVENOUS

## 2023-04-03 MED ORDER — FENTANYL CITRATE PF 50 MCG/ML IJ SOSY
25.0000 ug | PREFILLED_SYRINGE | INTRAMUSCULAR | Status: DC | PRN
  Administered 2023-04-03: 50 ug via INTRAVENOUS

## 2023-04-03 MED ORDER — ONDANSETRON HCL 4 MG/2ML IJ SOLN
INTRAMUSCULAR | Status: DC | PRN
  Administered 2023-04-03: 4 mg via INTRAVENOUS

## 2023-04-03 MED ORDER — ESMOLOL HCL 100 MG/10ML IV SOLN
INTRAVENOUS | Status: AC
  Filled 2023-04-03: qty 10

## 2023-04-03 MED ORDER — ONDANSETRON HCL 4 MG/2ML IJ SOLN
INTRAMUSCULAR | Status: AC
  Filled 2023-04-03: qty 2

## 2023-04-03 MED ORDER — BUPIVACAINE LIPOSOME 1.3 % IJ SUSP
INTRAMUSCULAR | Status: DC | PRN
  Administered 2023-04-03: 40 mL

## 2023-04-03 MED ORDER — OXYCODONE HCL 5 MG PO TABS
5.0000 mg | ORAL_TABLET | ORAL | Status: DC | PRN
  Administered 2023-04-04 – 2023-04-05 (×3): 5 mg via ORAL
  Filled 2023-04-03 (×4): qty 1

## 2023-04-03 MED ORDER — CHLORHEXIDINE GLUCONATE 0.12 % MT SOLN
15.0000 mL | Freq: Once | OROMUCOSAL | Status: AC
  Administered 2023-04-03: 15 mL via OROMUCOSAL

## 2023-04-03 MED ORDER — ACETAMINOPHEN 500 MG PO TABS
1000.0000 mg | ORAL_TABLET | Freq: Four times a day (QID) | ORAL | Status: AC
  Filled 2023-04-03 (×2): qty 2

## 2023-04-03 MED ORDER — MAGNESIUM CITRATE PO SOLN: 1.0000 | Freq: Once | ORAL | Status: DC

## 2023-04-03 MED ORDER — BUPIVACAINE LIPOSOME 1.3 % IJ SUSP
INTRAMUSCULAR | Status: AC
  Filled 2023-04-03: qty 20

## 2023-04-03 MED ORDER — AMOXICILLIN 875 MG PO TABS: 875.0000 mg | ORAL_TABLET | Freq: Two times a day (BID) | ORAL | Status: DC

## 2023-04-03 MED ORDER — CEFAZOLIN SODIUM-DEXTROSE 2-4 GM/100ML-% IV SOLN
2.0000 g | INTRAVENOUS | Status: AC
  Administered 2023-04-03: 2 g via INTRAVENOUS
  Filled 2023-04-03: qty 100

## 2023-04-03 MED ORDER — AMLODIPINE BESYLATE 5 MG PO TABS
5.0000 mg | ORAL_TABLET | Freq: Every day | ORAL | Status: DC
  Filled 2023-04-03 (×3): qty 1

## 2023-04-03 MED ORDER — ORAL CARE MOUTH RINSE: 15.0000 mL | Freq: Once | OROMUCOSAL | Status: AC

## 2023-04-03 MED ORDER — DEXAMETHASONE SODIUM PHOSPHATE 4 MG/ML IJ SOLN
INTRAMUSCULAR | Status: DC | PRN
  Administered 2023-04-03: 5 mg via INTRAVENOUS

## 2023-04-03 MED ORDER — OXYCODONE HCL 5 MG PO TABS: 5.0000 mg | ORAL_TABLET | Freq: Four times a day (QID) | ORAL | 0 refills | Status: DC | PRN

## 2023-04-03 MED ORDER — DEXAMETHASONE SODIUM PHOSPHATE 10 MG/ML IJ SOLN
INTRAMUSCULAR | Status: AC
  Filled 2023-04-03: qty 1

## 2023-04-03 MED ORDER — SODIUM CHLORIDE (PF) 0.9 % IJ SOLN
INTRAMUSCULAR | Status: AC
  Filled 2023-04-03: qty 20

## 2023-04-03 MED ORDER — OXYCODONE HCL 5 MG/5ML PO SOLN: 5.0000 mg | Freq: Once | ORAL | Status: DC | PRN

## 2023-04-03 MED ORDER — HYDROMORPHONE HCL 1 MG/ML IJ SOLN
0.5000 mg | INTRAMUSCULAR | Status: DC | PRN
  Administered 2023-04-03 – 2023-04-05 (×7): 1 mg via INTRAVENOUS
  Filled 2023-04-03 (×7): qty 1

## 2023-04-03 MED ORDER — ALBUMIN HUMAN 5 % IV SOLN: INTRAVENOUS | Status: DC | PRN

## 2023-04-03 MED ORDER — PHENOL 1.4 % MT LIQD: 1.0000 | OROMUCOSAL | Status: DC | PRN

## 2023-04-03 MED ORDER — MEPERIDINE HCL 50 MG/ML IJ SOLN: 6.2500 mg | INTRAMUSCULAR | Status: DC | PRN

## 2023-04-03 MED ORDER — ONDANSETRON HCL 4 MG/2ML IJ SOLN
4.0000 mg | INTRAMUSCULAR | Status: DC | PRN
  Administered 2023-04-06: 4 mg via INTRAVENOUS
  Filled 2023-04-03: qty 2

## 2023-04-03 MED ORDER — PROPOFOL 10 MG/ML IV BOLUS
INTRAVENOUS | Status: AC
  Filled 2023-04-03: qty 20

## 2023-04-03 MED ORDER — SODIUM CHLORIDE (PF) 0.9 % IJ SOLN
INTRAMUSCULAR | Status: DC | PRN
  Administered 2023-04-03: 20 mL

## 2023-04-03 MED ORDER — KETAMINE HCL 50 MG/5ML IJ SOSY
PREFILLED_SYRINGE | INTRAMUSCULAR | Status: AC
  Filled 2023-04-03: qty 5

## 2023-04-03 MED ORDER — FENTANYL CITRATE (PF) 100 MCG/2ML IJ SOLN
INTRAMUSCULAR | Status: DC | PRN
  Administered 2023-04-03 (×3): 50 ug via INTRAVENOUS

## 2023-04-03 MED ORDER — DOCUSATE SODIUM 100 MG PO CAPS
100.0000 mg | ORAL_CAPSULE | Freq: Two times a day (BID) | ORAL | Status: DC
  Administered 2023-04-04 – 2023-04-07 (×7): 100 mg via ORAL
  Filled 2023-04-03 (×8): qty 1

## 2023-04-03 MED ORDER — ACETAMINOPHEN 500 MG PO TABS
1000.0000 mg | ORAL_TABLET | Freq: Four times a day (QID) | ORAL | 0 refills | Status: AC
Start: 2023-04-03 — End: 2023-04-10

## 2023-04-03 MED ORDER — ACETAMINOPHEN 10 MG/ML IV SOLN
INTRAVENOUS | Status: AC
  Filled 2023-04-03: qty 100

## 2023-04-03 MED ORDER — ALBUMIN HUMAN 5 % IV SOLN
INTRAVENOUS | Status: AC
  Filled 2023-04-03: qty 500

## 2023-04-03 MED ORDER — LACTATED RINGERS IV SOLN: INTRAVENOUS | Status: DC

## 2023-04-03 MED ORDER — OXYCODONE HCL 5 MG PO TABS: 5.0000 mg | ORAL_TABLET | Freq: Once | ORAL | Status: DC | PRN

## 2023-04-03 MED ORDER — ACETAMINOPHEN 325 MG PO TABS: 325.0000 mg | ORAL_TABLET | ORAL | Status: DC | PRN

## 2023-04-03 MED ORDER — ONDANSETRON HCL 4 MG/2ML IJ SOLN: 4.0000 mg | Freq: Once | INTRAMUSCULAR | Status: DC | PRN

## 2023-04-03 MED ORDER — LIDOCAINE HCL (CARDIAC) PF 100 MG/5ML IV SOSY
PREFILLED_SYRINGE | INTRAVENOUS | Status: DC | PRN
  Administered 2023-04-03: 80 mg via INTRAVENOUS

## 2023-04-03 MED ORDER — FENTANYL CITRATE PF 50 MCG/ML IJ SOSY
PREFILLED_SYRINGE | INTRAMUSCULAR | Status: AC
  Administered 2023-04-03: 50 ug via INTRAVENOUS
  Filled 2023-04-03: qty 2

## 2023-04-03 MED ORDER — SENNOSIDES-DOCUSATE SODIUM 8.6-50 MG PO TABS: 1.0000 | ORAL_TABLET | Freq: Two times a day (BID) | ORAL | 0 refills | Status: AC

## 2023-04-03 SURGICAL SUPPLY — 59 items
BAG COUNTER SPONGE SURGICOUNT (BAG) ×2 IMPLANT
BAG LAPAROSCOPIC 12 15 PORT 16 (BASKET) ×2 IMPLANT
BAG RETRIEVAL 12/15 (BASKET) ×1
CHLORAPREP W/TINT 26 (MISCELLANEOUS) ×2 IMPLANT
CLIP LIGATING HEM O LOK PURPLE (MISCELLANEOUS) ×2 IMPLANT
CLIP LIGATING HEMO LOK XL GOLD (MISCELLANEOUS) ×2 IMPLANT
CLIP LIGATING HEMO O LOK GREEN (MISCELLANEOUS) ×2 IMPLANT
COVER SURGICAL LIGHT HANDLE (MISCELLANEOUS) ×2 IMPLANT
COVER TIP SHEARS 8 DVNC (MISCELLANEOUS) ×2 IMPLANT
CUTTER ECHEON FLEX ENDO 45 340 (ENDOMECHANICALS) IMPLANT
DERMABOND ADVANCED .7 DNX12 (GAUZE/BANDAGES/DRESSINGS) ×4 IMPLANT
DRAIN CHANNEL 15F RND FF 3/16 (WOUND CARE) IMPLANT
DRAPE ARM DVNC X/XI (DISPOSABLE) ×8 IMPLANT
DRAPE COLUMN DVNC XI (DISPOSABLE) ×2 IMPLANT
DRAPE INCISE IOBAN 66X45 STRL (DRAPES) ×2 IMPLANT
DRAPE SHEET LG 3/4 BI-LAMINATE (DRAPES) ×2 IMPLANT
DRIVER NDL LRG 8 DVNC XI (INSTRUMENTS) ×4 IMPLANT
DRIVER NDLE LRG 8 DVNC XI (INSTRUMENTS) ×2
ELECT PENCIL ROCKER SW 15FT (MISCELLANEOUS) ×2 IMPLANT
ELECT REM PT RETURN 15FT ADLT (MISCELLANEOUS) ×2 IMPLANT
EVACUATOR SILICONE 100CC (DRAIN) IMPLANT
FORCEPS BPLR FENES DVNC XI (FORCEP) ×2 IMPLANT
FORCEPS PROGRASP DVNC XI (FORCEP) ×2 IMPLANT
GLOVE BIO SURGEON STRL SZ 6.5 (GLOVE) ×2 IMPLANT
GLOVE SURG LX STRL 7.5 STRW (GLOVE) ×4 IMPLANT
GOWN STRL REUS W/ TWL XL LVL3 (GOWN DISPOSABLE) ×4 IMPLANT
GOWN STRL SURGICAL XL XLNG (GOWN DISPOSABLE) ×2 IMPLANT
HOLDER FOLEY CATH W/STRAP (MISCELLANEOUS) ×2 IMPLANT
IRRIG SUCT STRYKERFLOW 2 WTIP (MISCELLANEOUS) ×1
IRRIGATION SUCT STRKRFLW 2 WTP (MISCELLANEOUS) ×2 IMPLANT
KIT BASIN OR (CUSTOM PROCEDURE TRAY) ×2 IMPLANT
KIT TURNOVER KIT A (KITS) IMPLANT
LOOP VESSEL MAXI BLUE (MISCELLANEOUS) IMPLANT
NDL INSUFFLATION 14GA 120MM (NEEDLE) ×2 IMPLANT
NEEDLE INSUFFLATION 14GA 120MM (NEEDLE) ×1
PORT ACCESS TROCAR AIRSEAL 12 (TROCAR) ×2 IMPLANT
PROTECTOR NERVE ULNAR (MISCELLANEOUS) ×4 IMPLANT
RELOAD STAPLE 45 2.6 WHT THIN (STAPLE) IMPLANT
SCISSORS MNPLR CVD DVNC XI (INSTRUMENTS) ×2 IMPLANT
SEAL UNIV 5-12 XI (MISCELLANEOUS) ×8 IMPLANT
SET TRI-LUMEN FLTR TB AIRSEAL (TUBING) ×2 IMPLANT
SOL ELECTROSURG ANTI STICK (MISCELLANEOUS) ×1
SOLUTION ELECTROSURG ANTI STCK (MISCELLANEOUS) ×2 IMPLANT
SPIKE FLUID TRANSFER (MISCELLANEOUS) ×2 IMPLANT
SPONGE T-LAP 4X18 ~~LOC~~+RFID (SPONGE) IMPLANT
STAPLE RELOAD 45 WHT (STAPLE) ×6
SUT ETHILON 3 0 PS 1 (SUTURE) IMPLANT
SUT MNCRL AB 4-0 PS2 18 (SUTURE) ×4 IMPLANT
SUT PDS AB 1 CT1 27 (SUTURE) ×6 IMPLANT
SUT STRATA PDS 2-0 15 CT-2.5 (SUTURE) ×1
SUT VIC AB 2-0 SH 27X BRD (SUTURE) ×2 IMPLANT
SUT VICRYL 0 UR6 27IN ABS (SUTURE) IMPLANT
SUTURE STRAT PDS 2-0 15 CT-2.5 (SUTURE) IMPLANT
TOWEL OR 17X26 10 PK STRL BLUE (TOWEL DISPOSABLE) ×2 IMPLANT
TRAY FOLEY MTR SLVR 16FR STAT (SET/KITS/TRAYS/PACK) ×2 IMPLANT
TRAY LAPAROSCOPIC (CUSTOM PROCEDURE TRAY) ×2 IMPLANT
TROCAR Z THREAD OPTICAL 12X100 (TROCAR) ×2 IMPLANT
TROCAR Z-THREAD OPTICAL 5X100M (TROCAR) IMPLANT
WATER STERILE IRR 1000ML POUR (IV SOLUTION) ×2 IMPLANT

## 2023-04-03 NOTE — Anesthesia Procedure Notes (Signed)
Procedure Name: Intubation Date/Time: 04/03/2023 12:47 PM  Performed by: Deri Fuelling, CRNAPre-anesthesia Checklist: Patient identified, Emergency Drugs available, Suction available and Patient being monitored Patient Re-evaluated:Patient Re-evaluated prior to induction Oxygen Delivery Method: Circle system utilized Preoxygenation: Pre-oxygenation with 100% oxygen Induction Type: IV induction Ventilation: Mask ventilation without difficulty Laryngoscope Size: Mac and 3 Grade View: Grade I Tube type: Oral Tube size: 7.0 mm Number of attempts: 1 Airway Equipment and Method: Stylet and Oral airway Placement Confirmation: ETT inserted through vocal cords under direct vision, positive ETCO2 and breath sounds checked- equal and bilateral Secured at: 21 cm Tube secured with: Tape Dental Injury: Teeth and Oropharynx as per pre-operative assessment

## 2023-04-03 NOTE — Brief Op Note (Signed)
04/03/2023  3:05 PM  PATIENT:  Docia Barrier  82 y.o. female  PRE-OPERATIVE DIAGNOSIS:  LARGE RIGHT RENAL MASS  POST-OPERATIVE DIAGNOSIS:  large right renal mass  PROCEDURE:  Procedure(s) with comments: XI ROBOTIC ASSISTED RIGHT LAPAROSCOPIC RADICAL NEPHRECTOMY (Right) - 180 MINUTES  SURGEON:  Surgeons and Role:    * Shanaiya Bene, Delbert Phenix., MD - Primary  PHYSICIAN ASSISTANT:   ASSISTANTS: Zettie Pho MD   ANESTHESIA:   local and general  EBL:  230 mL   BLOOD ADMINISTERED: 2 units  CC PRBC  DRAINS:  foley to gravity    LOCAL MEDICATIONS USED:  MARCAINE     SPECIMEN:  Source of Specimen:  Rt kidney  DISPOSITION OF SPECIMEN:  PATHOLOGY  COUNTS:  YES  TOURNIQUET:  * No tourniquets in log *  DICTATION: .Other Dictation: Dictation Number [id number]  PLAN OF CARE: Admit to inpatient   PATIENT DISPOSITION:  PACU - hemodynamically stable.   Delay start of Pharmacological VTE agent (>24hrs) due to surgical blood loss or risk of bleeding: yes

## 2023-04-03 NOTE — Anesthesia Postprocedure Evaluation (Signed)
Anesthesia Post Note  Patient: Diane Paul  Procedure(s) Performed: XI ROBOTIC ASSISTED RIGHT LAPAROSCOPIC RADICAL NEPHRECTOMY (Right)     Patient location during evaluation: PACU Anesthesia Type: General Level of consciousness: awake and alert Pain management: pain level controlled Vital Signs Assessment: post-procedure vital signs reviewed and stable Respiratory status: spontaneous breathing, nonlabored ventilation, respiratory function stable and patient connected to nasal cannula oxygen Cardiovascular status: blood pressure returned to baseline and stable Postop Assessment: no apparent nausea or vomiting Anesthetic complications: no   No notable events documented.  Last Vitals:  Vitals:   04/03/23 1600 04/03/23 1615  BP: 134/75 132/75  Pulse: 88 86  Resp: 20 (!) 21  Temp:    SpO2: 92% 93%    Last Pain:  Vitals:   04/03/23 1615  TempSrc:   PainSc: Asleep                 Caine Barfield

## 2023-04-03 NOTE — Plan of Care (Signed)
  Problem: Education: Goal: Knowledge of the procedure and recovery process will improve Outcome: Progressing   Problem: Bowel/Gastric: Goal: Gastrointestinal status for postoperative course will improve Outcome: Progressing   Problem: Pain Management: Goal: General experience of comfort will improve Outcome: Progressing

## 2023-04-03 NOTE — Op Note (Signed)
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  PROCEDURE PERFORMED:  Robot-assisted laparoscopic right radical nephrectomy.  ESTIMATED BLOOD LOSS:  250 mL.  BLOOD GIVEN:  2 units.  DRAIN:  Foley catheter to straight drain.  SPECIMENS:  Right radical nephrectomy.  FINDINGS: 1.  Two artery, two vein, right renal vascular anatomy, numerous parasitic veins. 2.  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  ASSISTANT:  Zettie Pho, MD.  DESCRIPTION OF PROCEDURE:  The patient is a very pleasant 82 year old lady who is independent in all her activities of daily living at baseline who was found on workup of hematuria and chronic anemia to have a very large right renal neoplasm with high  suspicion of oligometastatic disease with some very small lung foci as well as a small bony focus on PET imaging.  After discussion of management including medical therapy alone versus combination of medical therapy with surgical extravasation,  understanding that any path is a non-curative goal.  Given her high functional status and her symptomatic anemia, both her and the medical oncology teams have favored upfront surgery with radical nephrectomy with the goal of maximal local control and  hopefully removing the nidus of her recurrent blood loss.  She presents for this today.  Informed consent was obtained and placed in medical record.  She is felt to be a reasonable surgical candidate.  Most recent hemoglobin is 7.5.  She has blood  available.  Anesthesia team and I have agreed on administering blood preoperatively preemptively.  Informed consent was obtained and placed in medical record.  PROCEDURE IN DETAIL:  The patient being  Diane Paul verified and procedure being right radical nephrectomy was confirmed.  Procedure timeout was performed.  Intravenous antibiotics were administered.  General anesthesia was induced.  Foley catheter was  placed per urethra straight drain.  The patient was placed into a right side up flank position, pulling 15 degrees of table flexion, superior arm elevator, axillary roll, sequential compression devices, bottom leg bent, top leg straight.  She was  further fashioned to the table using 3-inch tape over foam padding across supraxiphoid chest and her pelvis. Beanbag was deployed as was axillary roll for dependent axilla.  Sterile field was created, prepped and draped the patient's entire right flank  and abdomen using chlorhexidine gluconate and a high flow, low pressure, pneumoperitoneum was obtained using Veress technique in the right lower quadrant, having passed the aspiration drop test.  An 8 mm robotic camera port was then placed in position of  approximately one and a half  handbreadth superolaterally to umbilicus.  Laparoscopic examination of peritoneal cavity revealed no significant adhesions, no visceral injury.  Liver was quite large.  There was clearly mass effect from the large right  renal neoplasm.  Additional ports were placed as follows:  Right subcostal 8 mm robotic port, right far lateral 8 mm robotic port approximately 1 handbreadth superomedial to the anterior iliac spine, right inferior paramedian robotic port approximately ____ handbreadth  superior to pubic ramus and two 12 mm assistant port sites in a paramedian location, one approximately 4 fingerbreadths inferior medial to the robotic camera port, another 4 fingerbreadths superomedial to the robotic camera port, and finally a 5-mm  assistant port in a subxiphoid location through which a self-locking grasper  was used to elevate the inferior aspect of the liver off the anterior surface of Gerota's fascia acting as a liver  retractor.  Robot was docked and passed the electronic checks.   Initial attention was directed at development of retroperitoneum.  Incision made lateral to the ascending colon near the cecum towards the area of the hepatic flexure and the colon was carefully swept medially.  There was significant desmoplasia in all perirenal planes as well as soft tissue edema consistent with her aggressive neoplasm.  There were some loose adhesions in the right lower quadrant, mostly omental likely consistent with a prior open hysterectomy.   These were easily released.  The lower pole of the kidney area was identified and placed on lateral traction.  Dissection proceeded medial to this.  The ureter and gonadal vessels were encased in a large complex of parasitic vessels along with it.   These were circumferentially mobilized and taken en bloc with vascular stapler.  The inferior vena cava was visualized as was the psoas musculature acting in the medial plane and with very careful gentle lateral retraction on the kidney, dissection  proceeded in this medial plane towards the area of the renal hilum.  There was an inferior set of vessels consisting of a dominant artery in a smaller vein.  The artery was controlled using extra-large Hem-o-lok clip proximal and vascular stapler distal  controlling the inferior artery and vein.  Dissection continued superiorly essentially riding the border of the inferior vena cava all the way towards the superior set of vessels. These were more organized with a separate artery and vein.  Clearly, the  artery was circumferentially mobilized and controlled using extra large Hem-o-lok clip proximal, vascular stapler distal and vein using vascular stapler.  This resulted in excellent hemostatic control of the superior hilar structures.  A plane was chosen  in a partial adrenal-sparing fashion just for safety sake as the adrenal was very adherent to the lateral aspect of the inferior vena cava  and the adrenal was separated away from the lateral aspect of the inferior vena cava using a vascular stapler  leaving a scant amount of adrenal tissue purposely in situ as this was felt to be the safest.  I was quite happy with the safety and hemostatic completeness of the medial plane. The kidney was still very densely adherent in place and the posterior plane  was developed behind the kidney from inferior to superior direction.  There was significant desmoplastic reaction between the posterior aspect of the kidney and the psoas musculature, highly concerning for some superficial direct invasion; however, this  was still safely separated from psoas musculature such that the kidney was completely mobilized circumferentially except for its superior attachments. This allowed the kidney to be placed on more inferior traction, which allowed better visualization of  the superior attachments and were carefully taken down using sequential bipolar cautery technique. This completely freed up the large right radical nephrectomy specimen.  This was much too large for any sort of endoscopic retrieval bag.  All planes  around the radical nephrectomy were carefully inspected.  Hemostasis was quite good.  Liver retractor was taken down.  There was no evidence of hepatic injury.  Robot was then undocked. Specimen was retrieved by connecting the previous assistant port  sites in the paramedian location and the very large right radical nephrectomy specimen was removed through this, set aside for permanent pathology.  The fascia was closed using figure-of-eight PDS times approximately 10 followed by  reapproximation of  Scarpa's with running Vicryl.  All incision sites were infiltrated with dilute lipolyzed Marcaine and closed at the level of the skin using subcuticular Monocryl followed by Dermabond.  Procedure was terminated.  The patient tolerated the procedure well,  there were no immediate periprocedural complications.   The patient taken to postanesthesia care unit in stable condition.  Plan for observation admission especially following her blood counts which are already improving at the end of the surgery.   NIK D: 04/03/2023 3:15:52 pm T: 04/03/2023 8:47:00 pm  JOB: [id number]/ [id number]

## 2023-04-03 NOTE — Transfer of Care (Signed)
Immediate Anesthesia Transfer of Care Note  Patient: Diane Paul  Procedure(s) Performed: XI ROBOTIC ASSISTED RIGHT LAPAROSCOPIC RADICAL NEPHRECTOMY (Right)  Patient Location: PACU  Anesthesia Type:General  Level of Consciousness: awake and alert   Airway & Oxygen Therapy: Patient Spontanous Breathing and Patient connected to face mask oxygen  Post-op Assessment: Report given to RN and Post -op Vital signs reviewed and stable  Post vital signs: Reviewed and stable  Last Vitals:  Vitals Value Taken Time  BP 134/74 04/03/23 1520  Temp    Pulse 88 04/03/23 1523  Resp 19 04/03/23 1523  SpO2 94 % 04/03/23 1523  Vitals shown include unfiled device data.  Last Pain:  Vitals:   04/03/23 1149  TempSrc: Oral  PainSc:          Complications: No notable events documented.

## 2023-04-03 NOTE — H&P (Signed)
Diane Paul is an 82 y.o. female.    Chief Complaint: Pre-Op RIGHT Robotic Radical Nephretomy / Node Dissection  HPI:   1 - Metastatic / Very Large Volume Right Renal Cancer - 12cm+ heterogenous Rt renal cancer with scattered bialteral (non-bulky, <2cm each focus) PET avid pulm mets by CT and PET late 2024 on eval anemia. Minimal gross hematuria. 2 artery, 2 vein (accessory lower set and many parasitics inferior to origin of Rt gonadal vein) right renovascular anatomy, no over renal vein thrombus. Contralateral kidney unremarkable. Cr 0.7. . Dr. Cherly Hensen with med onc has seen as well.   PMH sig for breast cancer/mastectomy (2013), large obesity, hyst . 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 Daughter Si is very involved. Lives with daughter at baseline, cooks, drives, independatn in nearly all ADL's. Her PCP is Gilmore Laroche MD.   Today " Takari" is seen to proceed with RIGHT robotic radical nephrctomy / node dissection for oligometastatic renal cancer with symptomatic anemia. Has had multiple opinions including med-onc who strongly favors up-front surgery. Hgb 7.5, Cr 0.75, blood available.    Past Medical History:  Diagnosis Date   Anemia    Anxiety    nervous sometimes, denies panica ttacks    Arthritis    knees ?   Blood transfusion    post vaginal birth- 1960, 01/2023- anemia   Breast cancer (HCC)    Right Breast Cancer   Cancer (HCC)    R breast cancer   Dry mouth    GERD (gastroesophageal reflux disease)    Hypertension    Neuromuscular disorder (HCC)    L leg, thigh- "burns sometimes"   Shortness of breath    sometimes    Use of letrozole (Femara) 05/07/2010   neoadjuvant femara therapy since 1/212    Past Surgical History:  Procedure Laterality Date   ABDOMINAL HYSTERECTOMY     BIOPSY  02/03/2023   Procedure: BIOPSY;  Surgeon: Jeani Hawking, MD;  Location: [facility] ENDOSCOPY;  Service: Gastroenterology;;  gastric   BREAST LUMPECTOMY     BREAST SURGERY      Right   BRONCHIAL BIOPSY  02/12/2023   Procedure: BRONCHIAL BIOPSIES;  Surgeon: Leslye Peer, MD;  Location: [facility] ENDOSCOPY;  Service: Pulmonary;;   BRONCHIAL BIOPSY  03/11/2023   Procedure: BRONCHIAL BIOPSIES;  Surgeon: Leslye Peer, MD;  Location: [facility] ENDOSCOPY;  Service: Pulmonary;;   BRONCHIAL BRUSHINGS  02/12/2023   Procedure: BRONCHIAL BRUSHINGS;  Surgeon: Leslye Peer, MD;  Location: [facility] ENDOSCOPY;  Service: Pulmonary;;   BRONCHIAL BRUSHINGS  03/11/2023   Procedure: BRONCHIAL BRUSHINGS;  Surgeon: Leslye Peer, MD;  Location: [facility] ENDOSCOPY;  Service: Pulmonary;;   BRONCHIAL NEEDLE ASPIRATION BIOPSY  02/12/2023   Procedure: BRONCHIAL NEEDLE ASPIRATION BIOPSIES;  Surgeon: Leslye Peer, MD;  Location: [facility] ENDOSCOPY;  Service: Pulmonary;;   BRONCHIAL NEEDLE ASPIRATION BIOPSY  03/11/2023   Procedure: BRONCHIAL NEEDLE ASPIRATION BIOPSIES;  Surgeon: Leslye Peer, MD;  Location: [facility] ENDOSCOPY;  Service: Pulmonary;;   BRONCHIAL WASHINGS  02/12/2023   Procedure: BRONCHIAL WASHINGS;  Surgeon: Leslye Peer, MD;  Location: [facility] ENDOSCOPY;  Service: Pulmonary;;   COLONOSCOPY WITH PROPOFOL N/A 02/03/2023   Procedure: COLONOSCOPY WITH PROPOFOL;  Surgeon: Jeani Hawking, MD;  Location: [facility] ENDOSCOPY;  Service: Gastroenterology;  Laterality: N/A;   ESOPHAGOGASTRODUODENOSCOPY (EGD) WITH PROPOFOL N/A 02/03/2023   Procedure: ESOPHAGOGASTRODUODENOSCOPY (EGD) WITH PROPOFOL;  Surgeon: Jeani Hawking, MD;  Location: [facility] ENDOSCOPY;  Service: Gastroenterology;  Laterality: N/A;   HEMOSTASIS  CLIP PLACEMENT  02/03/2023   Procedure: HEMOSTASIS CLIP PLACEMENT;  Surgeon: Jeani Hawking, MD;  Location: [facility] ENDOSCOPY;  Service: Gastroenterology;;  ascending colon polyp   jp drains     s/p masectomy 06/11/11/ Dr. [name], 2 jp drains right chest wall   MASTECTOMY Right    malignant   MASTECTOMY W/ SENTINEL NODE BIOPSY  06/11/2011/Right BReast   Procedure: MASTECTOMY WITH SENTINEL LYMPH NODE BIOPSY;  Surgeon: Ernestene Mention, MD;  Location: [facility] OR;  Service: General;  Laterality: Right;  Right total mastectomy and sentinel lymph node biopsy using lymphatic mapping and blue dye injection.   POLYPECTOMY  02/03/2023   Procedure: POLYPECTOMY;  Surgeon: Jeani Hawking, MD;  Location: [facility] ENDOSCOPY;  Service: Gastroenterology;;  hot snare polypectomy ascending colon   SUBMUCOSAL LIFTING INJECTION  02/03/2023   Procedure: SUBMUCOSAL LIFTING INJECTION;  Surgeon: Jeani Hawking, MD;  Location: [facility] ENDOSCOPY;  Service: Gastroenterology;;  ever lift 16cc ascending colon   TUBAL LIGATION     [place] FINE NEEDLE ASPIRATION WO/ W SMEAR  05/22/10   Left Breast- cyst or abscess     Family History  Problem Relation Age of Onset   Breast cancer Sister    Anesthesia problems Neg Hx    Hypotension Neg Hx    Malignant hyperthermia Neg Hx    Pseudochol deficiency Neg Hx    Social History:  reports that she has never smoked. She has never used smokeless tobacco. She reports that she does not drink alcohol and does not use drugs.  Allergies:  Allergies  Allergen Reactions   Fosamax [Alendronate Sodium] Other (See Comments)    Aches and pains    No medications prior to admission.    Results for orders placed or performed during the hospital encounter of 04/01/23 (from the past 48 hour(s))  Type and screen [facility]     Status: None (Preliminary result)   Collection Time: 04/01/23  2:58 PM  Result Value Ref Range   ABO/RH(D) A POS    Antibody Screen NEG    Sample Expiration 04/04/2023,2359    Unit Number [id number]    Blood Component Type RED CELLS,LR    Unit division 00    Status of Unit REL FROM [facility]    Transfusion Status NOT NEEDED    Crossmatch Result NOT NEEDED    Unit Number [id number]    Blood Component Type RED CELLS,LR    Unit division 00    Status of Unit REL FROM [facility]    Transfusion Status NOT NEEDED    Crossmatch Result NOT NEEDED    Unit Number [id number]    Blood Component  Type RED CELLS,LR    Unit division 00    Status of Unit REL FROM [facility]    Transfusion Status NOT NEEDED    Crossmatch Result NOT NEEDED    Unit Number [id number]    Blood Component Type RED CELLS,LR    Unit division 00    Status of Unit ALLOCATED    Transfusion Status OK TO TRANSFUSE    Crossmatch Result      Compatible Performed at [facility], [address] [place], [place] [address]    Unit Number [id number]    Blood Component Type RED CELLS,LR    Unit division 00    Status of Unit ALLOCATED    Transfusion Status OK TO TRANSFUSE    Crossmatch Result Compatible    Unit Number [id number]    Blood Component Type RED  CELLS,LR    Unit division 00    Status of Unit ALLOCATED    Transfusion Status OK TO TRANSFUSE    Crossmatch Result Compatible   Prepare RBC (crossmatch)     Status: None   Collection Time: 04/01/23  2:58 PM  Result Value Ref Range   Order Confirmation      ORDER PROCESSED BY BLOOD BANK Performed at [facility], [address] [place], [place] [address]    No results found.  Review of Systems  Constitutional:  Positive for fatigue. Negative for chills and fever.  All other systems reviewed and are negative.   There were no vitals taken for this visit. Physical Exam Vitals reviewed.  Eyes:     Pupils: Pupils are equal, round, and reactive to light.  Cardiovascular:     Rate and Rhythm: Normal rate.  Pulmonary:     Effort: Pulmonary effort is normal.  Abdominal:     Comments: Stable large truncal obesity  Genitourinary:    Comments: No CVAT Musculoskeletal:        General: Normal range of motion.  Skin:    General: Skin is warm.  Neurological:     General: No focal deficit present.     Mental Status: She is alert.  Psychiatric:        Mood and Affect: Mood normal.       Proceed as planned with RIGHT radical nephrectomy / node dissection. Risks (including very real risk of mortality),  benefits (palliation / max debulking / prevention form recurrent anemia / bleeding), alternatives, expected peri-op course discussed previously and reiterated today. She and her family understand that her advanced age, oncologic + metabolic comorbidity increases risk of ALL complicaitons substantially.   Loletta Parish., MD 04/03/2023, 7:15 AM

## 2023-04-03 NOTE — Anesthesia Procedure Notes (Signed)
Arterial Line Insertion Start/End11/27/2024 1:05 PM Performed by: Bethena Midget, MD, anesthesiologist  Patient location: Pre-op. Preanesthetic checklist: patient identified, IV checked, site marked, risks and benefits discussed, surgical consent, monitors and equipment checked, pre-op evaluation, timeout performed and anesthesia consent Lidocaine 1% used for infiltration Left, radial was placed Catheter size: 20 G Hand hygiene performed  and maximum sterile barriers used   Attempts: 1 Procedure performed without using ultrasound guided technique. Following insertion, dressing applied. Post procedure assessment: normal and unchanged  Patient tolerated the procedure well with no immediate complications.

## 2023-04-03 NOTE — Discharge Instructions (Signed)

## 2023-04-04 ENCOUNTER — Encounter (HOSPITAL_COMMUNITY): Payer: Self-pay | Admitting: Urology

## 2023-04-04 DIAGNOSIS — N2889 Other specified disorders of kidney and ureter: Secondary | ICD-10-CM

## 2023-04-04 DIAGNOSIS — D630 Anemia in neoplastic disease: Secondary | ICD-10-CM

## 2023-04-04 LAB — CBC WITH DIFFERENTIAL/PLATELET
Abs Immature Granulocytes: 0.16 10*3/uL — ABNORMAL HIGH (ref 0.00–0.07)
Basophils Absolute: 0 10*3/uL (ref 0.0–0.1)
Basophils Relative: 0 %
Eosinophils Absolute: 0 10*3/uL (ref 0.0–0.5)
Eosinophils Relative: 0 %
HCT: 29.2 % — ABNORMAL LOW (ref 36.0–46.0)
Hemoglobin: 9.1 g/dL — ABNORMAL LOW (ref 12.0–15.0)
Immature Granulocytes: 1 %
Lymphocytes Relative: 7 %
Lymphs Abs: 1.2 10*3/uL (ref 0.7–4.0)
MCH: 25 pg — ABNORMAL LOW (ref 26.0–34.0)
MCHC: 31.2 g/dL (ref 30.0–36.0)
MCV: 80.2 fL (ref 80.0–100.0)
Monocytes Absolute: 0.8 10*3/uL (ref 0.1–1.0)
Monocytes Relative: 5 %
Neutro Abs: 15.5 10*3/uL — ABNORMAL HIGH (ref 1.7–7.7)
Neutrophils Relative %: 87 %
Platelets: 557 10*3/uL — ABNORMAL HIGH (ref 150–400)
RBC: 3.64 MIL/uL — ABNORMAL LOW (ref 3.87–5.11)
RDW: 19.7 % — ABNORMAL HIGH (ref 11.5–15.5)
WBC: 17.6 10*3/uL — ABNORMAL HIGH (ref 4.0–10.5)
nRBC: 0 % (ref 0.0–0.2)

## 2023-04-04 LAB — BASIC METABOLIC PANEL
Anion gap: 9 (ref 5–15)
BUN: 13 mg/dL (ref 8–23)
CO2: 25 mmol/L (ref 22–32)
Calcium: 8 mg/dL — ABNORMAL LOW (ref 8.9–10.3)
Chloride: 99 mmol/L (ref 98–111)
Creatinine, Ser: 0.97 mg/dL (ref 0.44–1.00)
GFR, Estimated: 58 mL/min — ABNORMAL LOW (ref >60)
Glucose, Bld: 181 mg/dL — ABNORMAL HIGH (ref 70–99)
Potassium: 4.1 mmol/L (ref 3.5–5.1)
Sodium: 133 mmol/L — ABNORMAL LOW (ref 135–145)

## 2023-04-04 LAB — GLUCOSE, CAPILLARY: Glucose-Capillary: 171 mg/dL — ABNORMAL HIGH (ref 70–99)

## 2023-04-04 MED ORDER — PHENYLEPHRINE-MINERAL OIL-PET 0.25-14-74.9 % RE OINT: 1.0000 | TOPICAL_OINTMENT | Freq: Two times a day (BID) | RECTAL | Status: DC | PRN

## 2023-04-04 MED ORDER — CHLORHEXIDINE GLUCONATE CLOTH 2 % EX PADS
6.0000 | MEDICATED_PAD | Freq: Every day | CUTANEOUS | Status: DC
  Administered 2023-04-04 – 2023-04-05 (×2): 6 via TOPICAL

## 2023-04-04 MED ORDER — HYDROCORTISONE (PERIANAL) 2.5 % EX CREA
TOPICAL_CREAM | Freq: Two times a day (BID) | CUTANEOUS | Status: DC
  Filled 2023-04-04: qty 28.35

## 2023-04-04 NOTE — Progress Notes (Signed)
Patient ID: Diane Paul, female   DOB: 08-15-1940, 82 y.o.   MRN: [id number]  Reason for Call:  Hypothermia  HPI: 82 year old POD 0 post right radical nephrectomy. Surgery without complications. Chronic anemia, initial hemoglobin 7.5, given preoperative blood transfusion. Postoperative hemoglobin 9.2. Postoperative vital signs stable, pain controlled.   Nurse called with concerns of hypothermia with a initial temperature of 93.9 taken rectally. Nursing staff appropriately called rapid response for further assessment, which wasoverall reassuring and unremarkable other than rectal temperature. Patient also voiced no complaints or concerns. Warming measures initiated including warm blankets, increased room temperature and removal of ice packs. Rectal temperature improved to 94.6, patient remains asympotmatic and all other vitals are unremarkable.   Stat labs ordered which are stable from prior with a hemoglobin of 9.1.   At this time, I do not feel she requires moving to an step down or ICU for warming measures. Encouraged staff to notify on call at any time with any further concerns, continue warming measures with warm blankets and increased room temperature. Continue MEWs protocol and follow up with MD in AM during rounds.   Greatly appreciate Rapid Response and Nursing assessment.

## 2023-04-04 NOTE — Progress Notes (Signed)
Urology Inpatient Progress Note  Subjective: POD # 1 s/p robotic assisted lap right radical nephrectomy. No complaints this AM.  Hypothermic last night, temp gradually improving. Tolerating clear liquids.  Pain controlled.  Anti-infectives: Anti-infectives (From admission, onward)    Start     Dose/Rate Route Frequency Ordered Stop   04/03/23 2200  amoxicillin (AMOXIL) tablet 875 mg  Status:  Discontinued        875 mg Oral 2 times daily 04/03/23 1618 04/03/23 1724   04/03/23 0845  ceFAZolin (ANCEF) IVPB 2g/100 mL premix        2 g 200 mL/hr over 30 Minutes Intravenous 30 min pre-op 04/03/23 0845 04/03/23 1229       Current Facility-Administered Medications  Medication Dose Route Frequency Provider Last Rate Last Admin   acetaminophen (TYLENOL) tablet 1,000 mg  1,000 mg Oral Q6H Zettie Pho, MD       amLODipine (NORVASC) tablet 5 mg  5 mg Oral Daily Zettie Pho, MD       dextrose 5 % and 0.45 % NaCl with KCl 20 mEq/L infusion   Intravenous Continuous Zettie Pho, MD 100 mL/hr at 04/04/23 0552 New Bag at 04/04/23 0552   docusate sodium (COLACE) capsule 100 mg  100 mg Oral BID Zettie Pho, MD       HYDROmorphone (DILAUDID) injection 0.5-1 mg  0.5-1 mg Intravenous Q2H PRN Zettie Pho, MD   1 mg at 04/03/23 2129   ondansetron (ZOFRAN) injection 4 mg  4 mg Intravenous Q4H PRN Zettie Pho, MD       oxyCODONE (Oxy IR/ROXICODONE) immediate release tablet 5 mg  5 mg Oral Q4H PRN Zettie Pho, MD   5 mg at 04/04/23 0555   phenol (CHLORASEPTIC) mouth spray 1 spray  1 spray Mouth/Throat PRN Loletta Parish., MD         Objective: Vital signs in last 24 hours: Temp:  [93.9 F (34.4 C)-98.3 F (36.8 C)] 95.5 F (35.3 C) (11/28 0600) Pulse Rate:  [65-98] 75 (11/28 0600) Resp:  [14-27] 14 (11/28 0405) BP: (122-158)/(50-95) 141/86 (11/28 0600) SpO2:  [88 %-100 %] 99 % (11/28 0600) Arterial Line BP: (138-154)/(60-65) 147/62 (11/27 1615) Weight:  [87.1 kg-89.5 kg]  89.5 kg (11/27 1716)  Intake/Output from previous day: 11/27 0701 - 11/28 0700 In: 3521.9 [P.O.:240; I.V.:2201.9; Blood:630; IV Piggyback:450] Out: 880 [Urine:650; Blood:230] Intake/Output this shift: No intake/output data recorded.  GENERAL APPEARANCE:  Well appearing, well developed, well nourished, NAD HEENT:  Atraumatic, normocephalic, oropharynx clear NECK:  Supple  CHEST:  CTA bilaterally CV:  RRR ABDOMEN:  Soft, ND, + BS, incisions intact EXTREMITIES:  Moves all extremities well, without clubbing, cyanosis, or edema NEUROLOGIC:  Alert and oriented x 3, CN II-XII grossly intact MENTAL STATUS:  appropriate BACK:  Non-tender to palpation, No CVAT SKIN:  Warm, dry, and intact GU:  foley draining clear urine   Lab Results:  Recent Labs    04/03/23 1045 04/03/23 1434 04/03/23 1608 04/04/23 0312  WBC 17.3*  --   --  17.6*  HGB 6.5*   < > 9.1* 9.1*  HCT 22.4*   < > 29.7* 29.2*  PLT 505*  --   --  557*   < > = values in this interval not displayed.   BMET Recent Labs    04/03/23 1608 04/04/23 0312  NA 134* 133*  K 3.9 4.1  CL 100 99  CO2 23 25  GLUCOSE 177* 181*  BUN 10 13  CREATININE 0.87  0.97  CALCIUM 8.0* 8.0*   PT/INR No results for input(s): "LABPROT", "INR" in the last 72 hours. ABG Recent Labs    04/03/23 1434  PHART 7.344*  HCO3 27.0    Studies/Results: No results found.   Assessment & Plan: POD #1 s/p robotic assisted lap right radical nephrectomy for right renal mass Anemia Hypothermia  Hemodynamically stable. H&H stable this AM. Continue warming for hypothermia. Advance to full liquids OOB to chair today, ambulate with assistance   Di Kindle, MD 04/04/2023

## 2023-04-04 NOTE — Progress Notes (Signed)
   04/03/23 2355  Assess: MEWS Score  Temp (!) 93.9 F (34.4 C)  BP (!) 158/95  MAP (mmHg) 113  Pulse Rate 67  Resp 19  SpO2 100 %  Assess: MEWS Score  MEWS Temp 2  MEWS Systolic 0  MEWS Pulse 0  MEWS RR 0  MEWS LOC 0  MEWS Score 2  MEWS Score Color Yellow  Assess: if the MEWS score is Yellow or Red  Were vital signs accurate and taken at a resting state? Yes  Does the patient meet 2 or more of the SIRS criteria? No  MEWS guidelines implemented  Yes, yellow  Treat  MEWS Interventions Considered administering scheduled or prn medications/treatments as ordered  Take Vital Signs  Increase Vital Sign Frequency  Yellow: Q2hr x1, continue Q4hrs until patient remains green for 12hrs  Escalate  MEWS: Escalate Yellow: Discuss with charge nurse and consider notifying provider and/or RRT  Notify: Charge Nurse/RN  Name of Charge Nurse/RN Notified Lauren RN  Provider Notification  Provider Name/Title Verlene Mayer NP  Date Provider Notified 04/03/23  Time Provider Notified 2355  Method of Notification Page  Notification Reason Other (Comment) (Hypothermia ( temp of 93.9)  Provider response See new orders;Evaluate remotely  Date of Provider Response 04/04/23  Time of Provider Response 0010  Notify: Rapid Response  Name of Rapid Response RN Notified Marchelle Folks RN  Date Rapid Response Notified 04/03/23  Time Rapid Response Notified 2355  Assess: SIRS CRITERIA  SIRS Temperature  1  SIRS Pulse 0  SIRS Respirations  0  SIRS WBC 0  SIRS Score Sum  1

## 2023-04-04 NOTE — Significant Event (Signed)
Rapid Response Event Note   Reason for Call :  hypothermia  Initial Focused Assessment:  Patient is alert/oriented x 4 and denies any pain or distress. Observed urine in foley bag is clear/yellow. No bleeding seen. Nephrectomy incision sites are unremarkable. Patient previously had ice over incisions, now removed. VSS with exception of rectal temperature at 93.9 at 2355. Lung and heart sounds unremarkable for abnormalities.  Interventions:  Warm blankets applied and temperature rechecked at 0013 & is 94.4. Called and spoke to [place], NP on call provider covering for urology who is attending. She advised to monitor patient for any changes in symptoms and vitals. No orders to transfer or consult hospitalist at this time.   Plan of Care:  No change in level of care at this time. Continue with keeping room temp warm and warm blankets, follow MEWS protocol, continue to monitor and call provider back if any other changes or needs. Encouraged incentive spirometer use. Patient and family agree to this plan.  Event Summary:   MD Notified: Janett Billow, Urology NP provider [phone] Call Time: 2357 Arrival Time: 0005 End Time: 0030  Lamona Curl, RN

## 2023-04-04 NOTE — Progress Notes (Signed)
Patient's rectal temp still at 94.4.Warm blankets applied, room temperature has been turned up. Notified Rapid response RN Clydie Braun NP. Janaya ordered some labwork. Phelobotomy called and awaiting lab draw and results.

## 2023-04-04 NOTE — Plan of Care (Signed)
  Problem: Pain Management: Goal: General experience of comfort will improve Outcome: Progressing   Problem: Urinary Elimination: Goal: Ability to avoid or minimize complications of infection will improve Outcome: Progressing   Problem: Education: Goal: Knowledge of General Education information will improve Description: Including pain rating scale, medication(s)/side effects and non-pharmacologic comfort measures Outcome: Progressing   Problem: Clinical Measurements: Goal: Will remain free from infection Outcome: Progressing

## 2023-04-04 NOTE — Progress Notes (Addendum)
Shortly after eating patient began to complain of sharp mediastinal pain. BP: 135/80 RR:20 SPO2: 98 on 2L. EKG obtained -Normal Sinus rhythm. Placed in chart. Given PRN Dilaudid.   RN stayed at bedside until pain subsided - likely due to reflux type response to eating. Will continue to monitor

## 2023-04-05 LAB — BPAM RBC
Blood Product Expiration Date: 202412152359
Blood Product Expiration Date: 202412152359
Blood Product Expiration Date: 202412162359
Blood Product Expiration Date: 202412172359
Blood Product Expiration Date: 202412192359
Blood Product Expiration Date: 202412192359
ISSUE DATE / TIME: 202411252148
ISSUE DATE / TIME: 202411261135
ISSUE DATE / TIME: 202411261424
ISSUE DATE / TIME: 202411271137
ISSUE DATE / TIME: 202411271137
Unit Type and Rh: 6200
Unit Type and Rh: 6200
Unit Type and Rh: 6200
Unit Type and Rh: 6200
Unit Type and Rh: 6200
Unit Type and Rh: 6200

## 2023-04-05 LAB — TYPE AND SCREEN
ABO/RH(D): A POS
Antibody Screen: NEGATIVE
Unit division: 0
Unit division: 0
Unit division: 0
Unit division: 0
Unit division: 0
Unit division: 0

## 2023-04-05 MED ORDER — TRAMADOL HCL 50 MG PO TABS
50.0000 mg | ORAL_TABLET | Freq: Four times a day (QID) | ORAL | Status: DC
  Administered 2023-04-05 – 2023-04-07 (×9): 50 mg via ORAL
  Filled 2023-04-05 (×9): qty 1

## 2023-04-05 MED ORDER — ZOLPIDEM TARTRATE 5 MG PO TABS
5.0000 mg | ORAL_TABLET | Freq: Once | ORAL | Status: AC
  Administered 2023-04-05: 5 mg via ORAL
  Filled 2023-04-05: qty 1

## 2023-04-05 NOTE — Progress Notes (Signed)
Urology Inpatient Progress Note  Subjective: No complaints this AM.  She had hallucinations after taking oxycodone last night. Tolerating full liquids this AM.  Has not ambulated yet.  Anti-infectives: Anti-infectives (From admission, onward)    Start     Dose/Rate Route Frequency Ordered Stop   04/03/23 2200  amoxicillin (AMOXIL) tablet 875 mg  Status:  Discontinued        875 mg Oral 2 times daily 04/03/23 1618 04/03/23 1724   04/03/23 0845  ceFAZolin (ANCEF) IVPB 2g/100 mL premix        2 g 200 mL/hr over 30 Minutes Intravenous 30 min pre-op 04/03/23 0845 04/03/23 1229       Current Facility-Administered Medications  Medication Dose Route Frequency Provider Last Rate Last Admin   amLODipine (NORVASC) tablet 5 mg  5 mg Oral Daily Zettie Pho, MD       Chlorhexidine Gluconate Cloth 2 % PADS 6 each  6 each Topical Daily Loletta Parish., MD   6 each at 04/04/23 1114   docusate sodium (COLACE) capsule 100 mg  100 mg Oral BID Zettie Pho, MD   100 mg at 04/04/23 2042   hydrocortisone (ANUSOL-HC) 2.5 % rectal cream   Rectal BID Milderd Meager., MD   Given at 04/04/23 2221   HYDROmorphone (DILAUDID) injection 0.5-1 mg  0.5-1 mg Intravenous Q2H PRN Zettie Pho, MD   1 mg at 04/05/23 0518   ondansetron (ZOFRAN) injection 4 mg  4 mg Intravenous Q4H PRN Zettie Pho, MD       phenol (CHLORASEPTIC) mouth spray 1 spray  1 spray Mouth/Throat PRN Loletta Parish., MD       traMADol Janean Sark) tablet 50 mg  50 mg Oral Q6H Shlonda Dolloff, Danford Bad., MD         Objective: Vital signs in last 24 hours: Temp:  [96.2 F (35.7 C)-97.7 F (36.5 C)] 97.4 F (36.3 C) (11/29 0407) Pulse Rate:  [84-100] 100 (11/29 0407) Resp:  [18-20] 19 (11/29 0407) BP: (124-149)/(76-94) 142/94 (11/29 0407) SpO2:  [94 %-98 %] 95 % (11/29 0407)  Intake/Output from previous day: 11/28 0701 - 11/29 0700 In: 2743 [P.O.:1920; I.V.:823] Out: 1250 [Urine:1250] Intake/Output this shift: No  intake/output data recorded.  GENERAL APPEARANCE:  Well appearing, well developed, well nourished, NAD HEENT:  Atraumatic, normocephalic, oropharynx clear NECK:  Supple without lymphadenopathy or thyromegaly ABDOMEN:  Soft, non-tender, no masses, + BS, incision intact EXTREMITIES:  Moves all extremities well, without clubbing, cyanosis, or edema NEUROLOGIC:  Alert and oriented x 3, CN II-XII grossly intact MENTAL STATUS:  appropriate BACK:  Non-tender to palpation, No CVAT SKIN:  Warm, dry, and intact GU:  foley draining clear urine   Lab Results:  Recent Labs    04/03/23 1045 04/03/23 1434 04/03/23 1608 04/04/23 0312  WBC 17.3*  --   --  17.6*  HGB 6.5*   < > 9.1* 9.1*  HCT 22.4*   < > 29.7* 29.2*  PLT 505*  --   --  557*   < > = values in this interval not displayed.   BMET Recent Labs    04/03/23 1608 04/04/23 0312  NA 134* 133*  K 3.9 4.1  CL 100 99  CO2 23 25  GLUCOSE 177* 181*  BUN 10 13  CREATININE 0.87 0.97  CALCIUM 8.0* 8.0*   PT/INR No results for input(s): "LABPROT", "INR" in the last 72 hours. ABG Recent Labs    04/03/23 1434  PHART 7.344*  HCO3 27.0    Studies/Results: No results found.   Assessment & Plan: POD #2 s/p robotic assisted lap right radical nephrectomy for right renal mass Anemia Hypothermia -  resolving  D/C foley Advance diet Ambulate  Di Kindle, MD 04/05/2023

## 2023-04-05 NOTE — Progress Notes (Signed)
Will pass along to day shift RN to talk to MD about different oral pain medication for patient as PRN oral oxycodone did not help.

## 2023-04-05 NOTE — Plan of Care (Signed)
  Problem: Education: Goal: Knowledge of the procedure and recovery process will improve Outcome: Progressing   Problem: Bowel/Gastric: Goal: Gastrointestinal status for postoperative course will improve Outcome: Progressing   Problem: Pain Management: Goal: General experience of comfort will improve Outcome: Progressing   Problem: Skin Integrity: Goal: Demonstration of wound healing without infection will improve Outcome: Progressing   Problem: Urinary Elimination: Goal: Ability to avoid or minimize complications of infection will improve Outcome: Progressing Goal: Ability to achieve and maintain urine output will improve Outcome: Progressing Goal: Home care management will improve Outcome: Progressing   Problem: Education: Goal: Knowledge of General Education information will improve Description: Including pain rating scale, medication(s)/side effects and non-pharmacologic comfort measures Outcome: Progressing   Problem: Health Behavior/Discharge Planning: Goal: Ability to manage health-related needs will improve Outcome: Progressing   Problem: Clinical Measurements: Goal: Ability to maintain clinical measurements within normal limits will improve Outcome: Progressing Goal: Will remain free from infection Outcome: Progressing Goal: Diagnostic test results will improve Outcome: Progressing Goal: Respiratory complications will improve Outcome: Progressing Goal: Cardiovascular complication will be avoided Outcome: Progressing   Problem: Activity: Goal: Risk for activity intolerance will decrease Outcome: Progressing   Problem: Nutrition: Goal: Adequate nutrition will be maintained Outcome: Progressing   Problem: Coping: Goal: Level of anxiety will decrease Outcome: Progressing   Problem: Elimination: Goal: Will not experience complications related to bowel motility Outcome: Progressing Goal: Will not experience complications related to urinary  retention Outcome: Progressing   Problem: Pain Management: Goal: General experience of comfort will improve Outcome: Progressing   Problem: Safety: Goal: Ability to remain free from injury will improve Outcome: Progressing   Problem: Skin Integrity: Goal: Risk for impaired skin integrity will decrease Outcome: Progressing   Problem: Education: Goal: Knowledge of the prescribed therapeutic regimen will improve Outcome: Progressing   Problem: Bowel/Gastric: Goal: Gastrointestinal status for postoperative course will improve Outcome: Progressing   Problem: Clinical Measurements: Goal: Postoperative complications will be avoided or minimized Outcome: Progressing   Problem: Respiratory: Goal: Ability to achieve and maintain a regular respiratory rate will improve Outcome: Progressing   Problem: Skin Integrity: Goal: Demonstration of wound healing without infection will improve Outcome: Progressing   Problem: Urinary Elimination: Goal: Ability to avoid or minimize complications of infection will improve Outcome: Progressing Goal: Ability to achieve and maintain urine output will improve Outcome: Progressing

## 2023-04-05 NOTE — TOC Initial Note (Signed)
Transition of Care [facility]) - Initial/Assessment Note    Patient Details  Name: Diane Paul MRN: [id number] Date of Birth: 07-May-1941  [facility] of [facility]) CM/SW Contact:    Howell Rucks, RN Phone Number: 04/05/2023, 12:52 PM  Clinical Narrative:   Met with pt and dtr and bedside to introduce role of TOC/NCM. Pt/dtr confirmed pt has an established PCP and pharmacy, no current home care services or home DME, dtr reports pt lives alone and that she and her siblings take turns caring for and staying with the the pt, family to provide transportation at discharge. PT eval, await recommendation.                 Expected Discharge Plan: Home w Home Health Services     Patient Goals and CMS Choice Patient states their goals for this hospitalization and ongoing recovery are:: return home with support from children          Expected Discharge Plan and Services       Living arrangements for the past 2 months: Single Family Home                                      Prior Living Arrangements/Services Living arrangements for the past 2 months: Single Family Home Lives with:: Self Patient language and need for interpreter reviewed:: Yes Do you feel safe going back to the place where you live?: Yes      Need for Family Participation in Patient Care: Yes (Comment) Care giver support system in place?: Yes (comment)   Criminal Activity/Legal Involvement Pertinent to Current Situation/Hospitalization: No - Comment as needed  Activities of Daily Living   ADL Screening (condition at time of admission) Independently performs ADLs?: Yes (appropriate for developmental age) Is the patient deaf or have difficulty hearing?: No Does the patient have difficulty seeing, even when wearing glasses/contacts?: No Does the patient have difficulty concentrating, remembering, or making decisions?: No  Permission Sought/Granted                  Emotional Assessment Appearance::  Appears stated age Attitude/Demeanor/Rapport: Gracious Affect (typically observed): Accepting Orientation: : Oriented to Self, Oriented to Place, Oriented to  Time Alcohol / Substance Use: Not Applicable Psych Involvement: No (comment)  Admission diagnosis:  Renal mass, right [N28.89] Patient Active Problem List   Diagnosis Date Noted   Renal mass, right 04/03/2023   NSCLC of left lung (HCC) 03/18/2023   Right renal mass 03/18/2023   Pulmonary nodules 02/12/2023   Malignant neoplasm metastatic to both lungs (HCC) 02/04/2023   Anemia in neoplastic disease 02/02/2023   Iron deficiency anemia 02/01/2023   Renal cell carcinoma (HCC) 02/01/2023   Weight loss 02/01/2023   Thrombocytosis 02/01/2023   Osteopenia 11/20/2013   Anxiety    Hypertension    Neuromuscular disorder (HCC)    Breast cancer of upper-outer quadrant of right female breast (HCC) 06/12/2011   PCP:  Donita Brooks, MD Pharmacy:   [facility] [id number], [place] - [address] [place] [address] Phone: [phone] Fax: [phone]  [id number] - [place], [place] - [address] [place] [address] Phone: [phone] Fax: [phone]  [id number] - [place], [place] - [address] [place]  [place] [address] Phone: [phone] Fax: [phone]     Social Determinants of Health (SDOH) Social History: SDOH Screenings   Food Insecurity: No Food Insecurity (04/03/2023)  Housing: Patient Declined (04/03/2023)  Transportation Needs: No Transportation Needs (04/03/2023)  Utilities: Not At Risk (04/03/2023)  Alcohol Screen: Low Risk  (07/25/2022)  Depression (PHQ2-9): Low Risk  (03/25/2023)  Financial Resource Strain: Low Risk  (03/25/2023)  Physical Activity: Sufficiently Active (07/25/2022)  Social Connections: Socially Isolated  (07/25/2022)  Stress: No Stress Concern Present (07/25/2022)  Tobacco Use: Low Risk  (04/03/2023)   SDOH Interventions:     Readmission Risk Interventions    04/05/2023   12:50 PM  Readmission Risk Prevention Plan  Transportation Screening Complete  PCP or Specialist Appt within 5-7 Days Complete  Home Care Screening Complete  Medication Review (RN CM) Complete

## 2023-04-05 NOTE — Plan of Care (Signed)
  Problem: Education: Goal: Knowledge of General Education information will improve Description: Including pain rating scale, medication(s)/side effects and non-pharmacologic comfort measures Outcome: Progressing   Problem: Coping: Goal: Level of anxiety will decrease Outcome: Progressing   Problem: Elimination: Goal: Will not experience complications related to urinary retention Outcome: Progressing   Problem: Pain Management: Goal: General experience of comfort will improve Outcome: Progressing   Problem: Safety: Goal: Ability to remain free from injury will improve Outcome: Progressing   Problem: Skin Integrity: Goal: Risk for impaired skin integrity will decrease Outcome: Progressing   Problem: Education: Goal: Knowledge of the prescribed therapeutic regimen will improve Outcome: Progressing   Problem: Clinical Measurements: Goal: Postoperative complications will be avoided or minimized Outcome: Progressing   Problem: Respiratory: Goal: Ability to achieve and maintain a regular respiratory rate will improve Outcome: Progressing   Problem: Skin Integrity: Goal: Demonstration of wound healing without infection will improve Outcome: Progressing   Problem: Urinary Elimination: Goal: Ability to achieve and maintain urine output will improve Outcome: Progressing

## 2023-04-05 NOTE — Progress Notes (Signed)
RN reached out to on call MD about seeing if patient could have something for sleep. RN had given patient PRN oxycodone for surgical pain; and since then, patient's daughter told RN that patient has been wide awake and acting "high as a kite". MD gave a verbal order for a one time dose of Ambien 5 mg orally.

## 2023-04-06 LAB — CBC
HCT: 31.7 % — ABNORMAL LOW (ref 36.0–46.0)
Hemoglobin: 9.5 g/dL — ABNORMAL LOW (ref 12.0–15.0)
MCH: 25.1 pg — ABNORMAL LOW (ref 26.0–34.0)
MCHC: 30 g/dL (ref 30.0–36.0)
MCV: 83.9 fL (ref 80.0–100.0)
Platelets: 411 10*3/uL — ABNORMAL HIGH (ref 150–400)
RBC: 3.78 MIL/uL — ABNORMAL LOW (ref 3.87–5.11)
RDW: 21.3 % — ABNORMAL HIGH (ref 11.5–15.5)
WBC: 13.5 10*3/uL — ABNORMAL HIGH (ref 4.0–10.5)
nRBC: 0 % (ref 0.0–0.2)

## 2023-04-06 LAB — BASIC METABOLIC PANEL
Anion gap: 9 (ref 5–15)
BUN: 17 mg/dL (ref 8–23)
CO2: 24 mmol/L (ref 22–32)
Calcium: 8.1 mg/dL — ABNORMAL LOW (ref 8.9–10.3)
Chloride: 101 mmol/L (ref 98–111)
Creatinine, Ser: 1.22 mg/dL — ABNORMAL HIGH (ref 0.44–1.00)
GFR, Estimated: 44 mL/min — ABNORMAL LOW (ref >60)
Glucose, Bld: 119 mg/dL — ABNORMAL HIGH (ref 70–99)
Potassium: 4.4 mmol/L (ref 3.5–5.1)
Sodium: 134 mmol/L — ABNORMAL LOW (ref 135–145)

## 2023-04-06 MED ORDER — ENSURE ENLIVE PO LIQD
237.0000 mL | Freq: Two times a day (BID) | ORAL | Status: DC
  Administered 2023-04-06: 237 mL via ORAL

## 2023-04-06 MED ORDER — ORAL CARE MOUTH RINSE: 15.0000 mL | OROMUCOSAL | Status: DC | PRN

## 2023-04-06 NOTE — Plan of Care (Signed)
  Problem: Safety: Goal: Ability to remain free from injury will improve Outcome: Progressing   Problem: Skin Integrity: Goal: Risk for impaired skin integrity will decrease Outcome: Progressing   Problem: Clinical Measurements: Goal: Postoperative complications will be avoided or minimized Outcome: Progressing   Problem: Education: Goal: Knowledge of the procedure and recovery process will improve Outcome: Progressing   Problem: Pain Management: Goal: General experience of comfort will improve Outcome: Progressing   Problem: Urinary Elimination: Goal: Ability to achieve and maintain urine output will improve Outcome: Progressing

## 2023-04-06 NOTE — TOC Transition Note (Addendum)
Transition of Care [facility]) - CM/SW Discharge Note   Patient Details  Name: Diane Paul MRN: [id number] Date of Birth: 1941/04/01  Transition of Care [facility]) CM/SW Contact:  Darleene Cleaver, LCSW Phone Number: 04/06/2023, 1:21 PM   Clinical Narrative:     Patient plans to return home with family.  Patient and family have declined HH services.  Patient needs a rolling walker, CSW contacted [employer] and spoke to [place].  Rolling walker will be ordered and delivered to the patient's room prior to her discharging.    2:20pm CSW was informed that patient will need a bedside commode.  CSW contacted [employer] and they will deliver the commode.  Final next level of care: Home/Self Care Barriers to Discharge: Barriers Resolved   Patient Goals and CMS Choice      Discharge Placement    Home with family.                  Patient and family notified of of transfer: 04/06/23  Discharge Plan and Services Additional resources added to the After Visit Summary for                  DME Arranged: Walker rolling DME Agency: [employer] Date DME Agency Contacted: 04/06/23 Time DME Agency Contacted: 1320 Representative spoke with at DME Agency: [facility] HH Arranged: Refused HH          Social Determinants of Health (SDOH) Interventions SDOH Screenings   Food Insecurity: No Food Insecurity (04/03/2023)  Housing: Patient Declined (04/03/2023)  Transportation Needs: No Transportation Needs (04/03/2023)  Utilities: Not At Risk (04/03/2023)  Alcohol Screen: Low Risk  (07/25/2022)  Depression (PHQ2-9): Low Risk  (03/25/2023)  Financial Resource Strain: Low Risk  (03/25/2023)  Physical Activity: Sufficiently Active (07/25/2022)  Social Connections: Socially Isolated (07/25/2022)  Stress: No Stress Concern Present (07/25/2022)  Tobacco Use: Low Risk  (04/03/2023)     Readmission Risk Interventions    04/05/2023   12:50 PM  Readmission Risk Prevention Plan  Transportation  Screening Complete  PCP or Specialist Appt within 5-7 Days Complete  Home Care Screening Complete  Medication Review (RN CM) Complete

## 2023-04-06 NOTE — Plan of Care (Signed)
  Problem: Education: Goal: Knowledge of the procedure and recovery process will improve Outcome: Progressing   Problem: Pain Management: Goal: General experience of comfort will improve Outcome: Progressing   Problem: Urinary Elimination: Goal: Home care management will improve Outcome: Progressing

## 2023-04-06 NOTE — Progress Notes (Signed)
Urology Inpatient Progress Report   Intv/Subj: No acute events overnight.  Tolerating soft diet.  No flatus.  Has not yet ambulated but has sat in chair.  Pain is well-controlled.  Principal Problem:   Renal mass, right Active Problems:   Anemia in neoplastic disease  Current Facility-Administered Medications  Medication Dose Route Frequency Provider Last Rate Last Admin   amLODipine (NORVASC) tablet 5 mg  5 mg Oral Daily Zettie Pho, MD       Chlorhexidine Gluconate Cloth 2 % PADS 6 each  6 each Topical Daily Berneice Heinrich Delbert Phenix., MD   6 each at 04/05/23 1031   docusate sodium (COLACE) capsule 100 mg  100 mg Oral BID Zettie Pho, MD   100 mg at 04/05/23 2141   hydrocortisone (ANUSOL-HC) 2.5 % rectal cream   Rectal BID Milderd Meager., MD   Given at 04/05/23 2144   HYDROmorphone (DILAUDID) injection 0.5-1 mg  0.5-1 mg Intravenous Q2H PRN Zettie Pho, MD   1 mg at 04/05/23 2141   ondansetron (ZOFRAN) injection 4 mg  4 mg Intravenous Q4H PRN Zettie Pho, MD       Oral care mouth rinse  15 mL Mouth Rinse PRN Berneice Heinrich Delbert Phenix., MD       phenol (CHLORASEPTIC) mouth spray 1 spray  1 spray Mouth/Throat PRN Loletta Parish., MD       traMADol Janean Sark) tablet 50 mg  50 mg Oral Q6H Milderd Meager., MD   50 mg at 04/06/23 0558     Objective: Vital: Vitals:   04/05/23 1249 04/05/23 1749 04/05/23 2131 04/06/23 0434  BP: (!) 146/93 139/81 (!) 143/83 133/84  Pulse: 87 95 99 98  Resp: 14 16 18 18   Temp: (!) 97.3 F (36.3 C) 97.7 F (36.5 C) (!) 97.5 F (36.4 C) 97.8 F (36.6 C)  TempSrc: Oral Oral Oral Oral  SpO2: 96% 96% 92% 95%  Weight:      Height:       I/Os: I/O last 3 completed shifts: In: 910 [P.O.:910] Out: 1675 [Urine:1675]  Physical Exam:  General: Patient is in no apparent distress Lungs: Normal respiratory effort, chest expands symmetrically. GI: Incisions are c/d/i.  The abdomen is soft, nondistended and appropriately tender RUQ Foley:  Has been discontinued Ext: lower extremities symmetric  Lab Results: Recent Labs    04/03/23 1045 04/03/23 1434 04/03/23 1608 04/04/23 0312 04/06/23 0421  WBC 17.3*  --   --  17.6* 13.5*  HGB 6.5*   < > 9.1* 9.1* 9.5*  HCT 22.4*   < > 29.7* 29.2* 31.7*   < > = values in this interval not displayed.   Recent Labs    04/03/23 1434 04/03/23 1608 04/04/23 0312  NA 135 134* 133*  K 4.1 3.9 4.1  CL  --  100 99  CO2  --  23 25  GLUCOSE  --  177* 181*  BUN  --  10 13  CREATININE  --  0.87 0.97  CALCIUM  --  8.0* 8.0*   No results for input(s): "LABPT", "INR" in the last 72 hours. No results for input(s): "LABURIN" in the last 72 hours. Results for orders placed or performed during the hospital encounter of 02/12/23  Fungus Culture With Stain     Status: None   Collection Time: 02/12/23  2:43 PM   Specimen: Bronchial Alveolar Lavage; Respiratory  Result Value Ref Range Status   Fungus Stain Final report  Final  Fungus (Mycology) Culture Final report  Final    Comment: (NOTE) Performed At: [facility] [address] [place], [place] [id number] Jolene Schimke MD HY:[id number]    Fungal Source BRONCHIAL ALVEOLAR LAVAGE  Final    Comment: RUL Performed at [facility], [address] [place], [place] [address]   Culture, BAL-quantitative w Gram Stain     Status: None   Collection Time: 02/12/23  2:43 PM   Specimen: Bronchial Alveolar Lavage; Respiratory  Result Value Ref Range Status   Specimen Description BRONCHIAL ALVEOLAR LAVAGE  Final   Special Requests RUL  Final   Gram Stain NO WBC SEEN NO ORGANISMS SEEN   Final   Culture   Final    NO GROWTH 2 DAYS Performed at [facility], [address] [place], [place] [address]    Report Status 02/14/2023 FINAL  Final  Acid Fast Culture with reflexed sensitivities     Status: None   Collection Time: 02/12/23  2:43 PM   Specimen: Bronchial Alveolar Lavage; Respiratory  Result Value Ref Range  Status   Acid Fast Culture Negative  Final    Comment: (NOTE) No acid fast bacilli isolated after 6 weeks. Performed At: [facility] [address] [place], [place] [id number] Jolene Schimke MD UY:[id number]    Source of Sample BRONCHIAL ALVEOLAR LAVAGE  Final    Comment: RUL Performed at [facility], [address] [place], [place] [address]   Acid Fast Smear (AFB)     Status: None   Collection Time: 02/12/23  2:43 PM   Specimen: Bronchial Alveolar Lavage; Respiratory  Result Value Ref Range Status   AFB Specimen Processing Concentration  Final   Acid Fast Smear Negative  Final    Comment: (NOTE) Performed At: [facility] [address] [place], [place] [id number] Jolene Schimke MD AC:[id number]    Source (AFB) BRONCHIAL ALVEOLAR LAVAGE  Final    Comment: RUL Performed at [facility], [address], [place], [place] [address]   Fungus Culture Result     Status: None   Collection Time: 02/12/23  2:43 PM  Result Value Ref Range Status   Result 1 Comment  Final    Comment: (NOTE) KOH/Calcofluor preparation:  no fungus observed. Performed At: [facility] [address] [place], [place] [id number] Jolene Schimke MD RK:[id number]   Fungal organism reflex     Status: None   Collection Time: 02/12/23  2:43 PM  Result Value Ref Range Status   Fungal result 1 Comment  Final    Comment: (NOTE) No yeast or mold isolated after 4 weeks. Performed At: [facility] [address] [place], [place] [id number] Jolene Schimke MD VP:[id number]     Studies/Results: No results found.  Assessment/Plan: POD#3 s/p robotic assisted laparoscopic RIGHT radical nephrectomy -Ambulate -Advance diet as tolerated -Tramadol as needed pain -Anticipate discharge tomorrow pending ambulation   Kasandra Knudsen, MD Urology 04/06/2023, 8:25 AM

## 2023-04-06 NOTE — Evaluation (Signed)
Physical Therapy Evaluation Patient Details Name: Diane Paul MRN: [id number] DOB: 1940/11/24 Today's Date: 04/06/2023  History of Present Illness  82 year old, s/p robotic assisted lap right radical nephrectomy for right renal mass on 04/03/23 with post-op on chronic Anemia. PMH sig for breast cancer/mastectomy (2013),  obesity  Clinical Impression  On eval, pt required Min A for mobility. She walked ~50 feet with a RW. Pain rated 6/10 with activity. Pt presents with general weakness, decreased activity tolerance, and impaired gait and balance.She is at risk for falls when mobilizing. Family was present during session-plan is for 24/7 supervision and assist at home. Recommend HHPT f/u but pt politely declines-"I think I'll do better by myself at home." Will follow during hospital stay. Recommend daily ambulation in hallway with nursing and/or mobility team.         If plan is discharge home, recommend the following: A little help with walking and/or transfers;A little help with bathing/dressing/bathroom;Assistance with cooking/housework;Assist for transportation;Help with stairs or ramp for entrance   Can travel by private vehicle        Equipment Recommendations Rolling walker (2 wheels);BSC/3in1  Recommendations for Other Services       Functional Status Assessment Patient has had a recent decline in their functional status and demonstrates the ability to make significant improvements in function in a reasonable and predictable amount of time.     Precautions / Restrictions Precautions Precautions: Fall Restrictions Weight Bearing Restrictions: No      Mobility  Bed Mobility               General bed mobility comments: oob in recliner    Transfers Overall transfer level: Needs assistance Equipment used: Rolling walker (2 wheels) Transfers: Sit to/from Stand Sit to Stand: Min assist           General transfer comment: Assist to rise, steady, control  descent. Cues for safety, technique, hand placement. Increased time.    Ambulation/Gait Ambulation/Gait assistance: Min assist Gait Distance (Feet): 50 Feet Assistive device: Rolling walker (2 wheels) Gait Pattern/deviations: Decreased stride length, Step-through pattern       General Gait Details: Slow gait speed. Cues for posture, RW proximity, and increased step lengths. Followed with recliner and used it to transport pt back to bedside.  Stairs            Wheelchair Mobility     Tilt Bed    Modified Rankin (Stroke Patients Only)       Balance Overall balance assessment: Needs assistance         Standing balance support: Reliant on assistive device for balance, Bilateral upper extremity supported, During functional activity Standing balance-Leahy Scale: Poor                               Pertinent Vitals/Pain Pain Assessment Pain Assessment: 0-10 Pain Score: 6  Pain Location: abdomen Pain Descriptors / Indicators: Discomfort, Grimacing Pain Intervention(s): Limited activity within patient's tolerance, Monitored during session, Repositioned    Home Living Family/patient expects to be discharged to:: Private residence Living Arrangements: Children Available Help at Discharge: Family Type of Home: House Home Access: Stairs to enter Entrance Stairs-Rails: Right Entrance Stairs-Number of Steps: 4   Home Layout: One level Home Equipment: None      Prior Function Prior Level of Function : Independent/Modified Independent             Mobility Comments: ind  Extremity/Trunk Assessment   Upper Extremity Assessment Upper Extremity Assessment: Defer to OT evaluation    Lower Extremity Assessment Lower Extremity Assessment: Generalized weakness    Cervical / Trunk Assessment Cervical / Trunk Assessment: Normal  Communication   Communication Communication: No apparent difficulties Cueing Techniques: Verbal cues  Cognition  Arousal: Alert Behavior During Therapy: Flat affect, WFL for tasks assessed/performed Overall Cognitive Status: Within Functional Limits for tasks assessed                                 General Comments: a bit slow with responses at times        General Comments      Exercises     Assessment/Plan    PT Assessment Patient needs continued PT services  PT Problem List Decreased strength;Decreased activity tolerance;Decreased balance;Decreased mobility;Decreased knowledge of use of DME;Pain       PT Treatment Interventions DME instruction;Gait training;Functional mobility training;Therapeutic activities;Therapeutic exercise;Patient/family education;Balance training    PT Goals (Current goals can be found in the Care Plan section)  Acute Rehab PT Goals Patient Stated Goal: less pain PT Goal Formulation: With patient/family Time For Goal Achievement: 04/20/23 Potential to Achieve Goals: Good    Frequency Min 1X/week     Co-evaluation               AM-PAC PT "6 Clicks" Mobility  Outcome Measure Help needed turning from your back to your side while in a flat bed without using bedrails?: A Lot Help needed moving from lying on your back to sitting on the side of a flat bed without using bedrails?: A Little Help needed moving to and from a bed to a chair (including a wheelchair)?: A Little Help needed standing up from a chair using your arms (e.g., wheelchair or bedside chair)?: A Little Help needed to walk in hospital room?: A Little Help needed climbing 3-5 steps with a railing? : A Lot 6 Click Score: 16    End of Session Equipment Utilized During Treatment: Gait belt Activity Tolerance: Patient limited by pain;Patient limited by fatigue Patient left: in chair;with call bell/phone within reach;with family/visitor present   PT Visit Diagnosis: Muscle weakness (generalized) (M62.81);Difficulty in walking, not elsewhere classified (R26.2)    Time:  9604-5409 PT Time Calculation (min) (ACUTE ONLY): 25 min   Charges:   PT Evaluation $PT Eval Low Complexity: 1 Low PT Treatments $Gait Training: 8-22 mins PT General Charges $$ ACUTE PT VISIT: 1 Visit            Faye Ramsay, PT Acute Rehabilitation  Office: [phone]

## 2023-04-06 NOTE — Evaluation (Signed)
Occupational Therapy Evaluation Patient Details Name: Diane Paul MRN: [id number] DOB: [birth year] Today's Date: 04/06/2023   History of Present Illness 82 year old, s/p robotic assisted lap right radical nephrectomy for right renal mass on 04/03/23 with post-op on chronic Anemia. PMH sig for breast cancer/mastectomy (2013),  obesity   Clinical Impression   Patient is currently requiring assistance with ADLs including up to maximum assist with Lower body ADLs, up to minimal assist with Upper body ADLs,  as well as minimal assist with functional transfers to toilet.    Current level of function is below patient's typical baseline.  During this evaluation, patient was limited by generalized weakness, impaired activity tolerance, and post-op pain as well as cognitive (new) impairments, all of which has the potential to impact patient's safety and independence during functional mobility, as well as performance for ADLs.  Patient lives with family, who is able to provide 24/7 supervision and assistance.  Patient demonstrates good rehab potential, and should benefit from continued skilled occupational therapy services while in acute care to maximize safety, independence and quality of life at home.  Continued occupational therapy services in the home are recommended.  ?        If plan is discharge home, recommend the following: A little help with bathing/dressing/bathroom;A little help with walking and/or transfers;Supervision due to cognitive status;Direct supervision/assist for medications management;Direct supervision/assist for financial management;Assist for transportation;Assistance with cooking/housework    Functional Status Assessment  Patient has had a recent decline in their functional status and demonstrates the ability to make significant improvements in function in a reasonable and predictable amount of time.  Equipment Recommendations  Tub/shower bench;Other (comment) ("Hip kit")     Recommendations for Other Services       Precautions / Restrictions Precautions Precautions: Fall Restrictions Weight Bearing Restrictions: No      Mobility Bed Mobility               General bed mobility comments: oob in recliner    Transfers                          Balance Overall balance assessment: Needs assistance   Sitting balance-Leahy Scale: Good     Standing balance support: Reliant on assistive device for balance, Bilateral upper extremity supported, During functional activity Standing balance-Leahy Scale: Poor Standing balance comment: Needs cues for RW safety. Pt had not used one before.                           ADL either performed or assessed with clinical judgement   ADL Overall ADL's : Needs assistance/impaired Eating/Feeding: Set up   Grooming: Wash/dry hands;Sitting;Set up       Lower Body Bathing: Moderate assistance;Sit to/from stand;Sitting/lateral leans   Upper Body Dressing : Minimal assistance;Sitting   Lower Body Dressing: Total assistance Lower Body Dressing Details (indicate cue type and reason): To manage socks. ABD incisions preventing forward bend and pt cannot perform figure 4. Briefly spoke of AE, but pt would benefit from recinforcement. Toilet Transfer: Minimal assistance;Rolling walker (2 wheels);BSC/3in1;Comfort height toilet;Ambulation;Cueing for safety;Cueing for sequencing Toilet Transfer Details (indicate cue type and reason): Pt presented on large BSC and stayed for all intake questions. Pt reported no luck with BM and asked to ambulate in room. Stood from [facility] with cues and Min As to [employer]. Pt used RW in room and educated on appropaching bathroom door safely  with RW. Pt transfered onot comfort height toilet with CGA and grab bar. Pt stood from toilet with grab bar and Min As and ambulated back to recliner. Toileting- Clothing Manipulation and Hygiene: Minimal assistance;Cueing for  safety;Sitting/lateral lean;Sit to/from stand;Moderate assistance Toileting - Clothing Manipulation Details (indicate cue type and reason): Mod As Tour manager. Min As Programmer, systems Details (indicate cue type and reason): Pt and daughter educated on possibel bathroom DME to increase safety with tub transfers. Pt unasur3e of her shower chair and daughter is unfamiliar with it. Daughter took pictures of tub bench that OT presented and educated on setup and positioning. Functional mobility during ADLs: Minimal assistance;Contact guard assist;Cueing for sequencing;Cueing for safety;Rolling walker (2 wheels)       Vision   Vision Assessment?: No apparent visual deficits     Perception         Praxis         Pertinent Vitals/Pain Pain Assessment Pain Assessment: Faces Faces Pain Scale: Hurts little more Pain Location: abdomen Pain Descriptors / Indicators: Discomfort, Grimacing Pain Intervention(s): Limited activity within patient's tolerance, Monitored during session, Repositioned     Extremity/Trunk Assessment Upper Extremity Assessment Upper Extremity Assessment: Generalized weakness   Lower Extremity Assessment Lower Extremity Assessment: Generalized weakness   Cervical / Trunk Assessment Cervical / Trunk Assessment: Normal   Communication Communication Communication: No apparent difficulties Cueing Techniques: Verbal cues;Visual cues;Tactile cues   Cognition Arousal: Alert Behavior During Therapy: Flat affect, WFL for tasks assessed/performed Overall Cognitive Status: Impaired/Different from baseline Area of Impairment: Attention, Memory, Awareness, Following commands                   Current Attention Level: Selective Memory: Decreased short-term memory Following Commands: Follows one step commands consistently   Awareness: Emergent   General Comments: a bit slow with responses at times.  Seems to forget what she is doing. Example.  In bathroom, pt pushed RW to sink. Once at sink pt stated, "I wnt to sit on the toilet." Pt did not initiate until prompted. Pt then backed up to toilet and instructed to use grab bar to sit. Pt begab walking ofrward with RW to sink again. Pt asked, "where am I going" and needed redirection to toilet.  Seems to do well with simple 1-step commands but difficulty with 2+     General Comments       Exercises     Shoulder Instructions      Home Living Family/patient expects to be discharged to:: Private residence Living Arrangements: Children Available Help at Discharge: Family;Available 24 hours/day (3 kids will rotate for 24/7 care) Type of Home: House Home Access: Stairs to enter Entrance Stairs-Number of Steps: 4 Entrance Stairs-Rails: Right Home Layout: One level     Bathroom Shower/Tub: Tub/shower unit         Home Equipment: Shower seat;Cane - single point;Cane - quad;Adaptive equipment Adaptive Equipment: Reacher Additional Comments: RW delivered to room and family reports BSC to be delivered.      Prior Functioning/Environment Prior Level of Function : Needs assist;Driving;History of Falls (last six months)       Physical Assist : ADLs (physical)   ADLs (physical): Bathing;IADLs Mobility Comments: ind ADLs Comments: Assisted with tub/shower transfers. Family provides IADLs and leaves pt meals for the microwave.        OT Problem List: Decreased strength;Decreased cognition;Decreased safety awareness;Decreased activity tolerance;Decreased knowledge of use of DME or AE;Pain;Impaired balance (sitting and/or standing)  OT Treatment/Interventions: Self-care/ADL training;Therapeutic activities;Therapeutic exercise;Cognitive remediation/compensation;Energy conservation;Patient/family education;Balance training;DME and/or AE instruction    OT Goals(Current goals can be found in the care plan section) Acute Rehab OT Goals Patient Stated Goal: Home with family OT  Goal Formulation: With patient/family Time For Goal Achievement: 04/20/23 Potential to Achieve Goals: Good ADL Goals Pt Will Perform Grooming: with supervision;standing Pt Will Perform Lower Body Bathing: with supervision;with adaptive equipment;sitting/lateral leans;sit to/from stand Pt Will Perform Lower Body Dressing: with supervision;with set-up;with adaptive equipment;sitting/lateral leans;sit to/from stand Pt Will Transfer to Toilet: ambulating;with supervision Pt Will Perform Toileting - Clothing Manipulation and hygiene: with adaptive equipment;with modified independence  OT Frequency: Min 1X/week    Co-evaluation              AM-PAC OT "6 Clicks" Daily Activity     Outcome Measure Help from another person eating meals?: A Little Help from another person taking care of personal grooming?: A Little Help from another person toileting, which includes using toliet, bedpan, or urinal?: A Lot Help from another person bathing (including washing, rinsing, drying)?: A Lot Help from another person to put on and taking off regular upper body clothing?: A Little Help from another person to put on and taking off regular lower body clothing?: A Lot 6 Click Score: 15   End of Session Equipment Utilized During Treatment: Gait belt;Rolling walker (2 wheels) Nurse Communication: Mobility status  Activity Tolerance: Patient limited by fatigue Patient left: in chair;with call bell/phone within reach;with chair alarm set;with family/visitor present  OT Visit Diagnosis: Muscle weakness (generalized) (M62.81);History of falling (Z91.81);Unsteadiness on feet (R26.81) (Decreased ADLs)                Time: 4098-1191 OT Time Calculation (min): 39 min Charges:  OT General Charges $OT Visit: 1 Visit OT Evaluation $OT Eval Low Complexity: 1 Low OT Treatments $Self Care/Home Management : 8-22 mins $Therapeutic Activity: 8-22 mins  Victorino Dike, OT Acute Rehab Services Office:  [phone] 04/06/2023   Theodoro Clock 04/06/2023, 3:51 PM

## 2023-04-07 MED ORDER — TRAMADOL HCL 50 MG PO TABS: 50.0000 mg | ORAL_TABLET | Freq: Four times a day (QID) | ORAL | 0 refills | Status: DC | PRN

## 2023-04-07 NOTE — Discharge Summary (Signed)
Date of admission: 04/03/2023  Date of discharge: 04/07/2023  Admission diagnosis: right renal mass  Discharge diagnosis: right renal mass  Secondary diagnoses:  Patient Active Problem List   Diagnosis Date Noted   Renal mass, right 04/03/2023   NSCLC of left lung (HCC) 03/18/2023   Right renal mass 03/18/2023   Pulmonary nodules 02/12/2023   Malignant neoplasm metastatic to both lungs (HCC) 02/04/2023   Anemia in neoplastic disease 02/02/2023   Iron deficiency anemia 02/01/2023   Renal cell carcinoma (HCC) 02/01/2023   Weight loss 02/01/2023   Thrombocytosis 02/01/2023   Osteopenia 11/20/2013   Anxiety    Hypertension    Neuromuscular disorder (HCC)    Breast cancer of upper-outer quadrant of right female breast (HCC) 06/12/2011    Procedures performed: Procedure(s): XI ROBOTIC ASSISTED RIGHT LAPAROSCOPIC RADICAL NEPHRECTOMY  History and Physical: For full details, please see admission history and physical. Briefly, Diane Paul is a 82 y.o. year old patient with right renal mass who underwent a robotic assisted laparoscopic right radical nephrectomy on 04/03/2023.  At discharge patient was ambulating with a roller walker.  Her abdomen was soft and appropriately tender.  Her incision was clean dry intact.  She was voiding without a catheter.  Hospital Course: Patient tolerated the procedure well.  She was then transferred to the floor after an uneventful PACU stay.  Her hospital course was uncomplicated.  On POD#4 she had met discharge criteria: was eating a regular diet, was up and ambulating independently,  pain was well controlled, was voiding without a catheter, and was ready to for discharge.   Laboratory values:  Recent Labs    04/06/23 0421  WBC 13.5*  HGB 9.5*  HCT 31.7*   Recent Labs    04/06/23 1710  NA 134*  K 4.4  CL 101  CO2 24  GLUCOSE 119*  BUN 17  CREATININE 1.22*  CALCIUM 8.1*   No results for input(s): "LABPT", "INR" in the last 72  hours. No results for input(s): "LABURIN" in the last 72 hours. Results for orders placed or performed during the hospital encounter of 02/12/23  Fungus Culture With Stain     Status: None   Collection Time: 02/12/23  2:43 PM   Specimen: Bronchial Alveolar Lavage; Respiratory  Result Value Ref Range Status   Fungus Stain Final report  Final   Fungus (Mycology) Culture Final report  Final    Comment: (NOTE) Performed At: [facility] [address] [place], [place] [id number] Jolene Schimke MD WG:[id number]    Fungal Source BRONCHIAL ALVEOLAR LAVAGE  Final    Comment: RUL Performed at [facility], [address] [place], [place] [address]   Culture, BAL-quantitative w Gram Stain     Status: None   Collection Time: 02/12/23  2:43 PM   Specimen: Bronchial Alveolar Lavage; Respiratory  Result Value Ref Range Status   Specimen Description BRONCHIAL ALVEOLAR LAVAGE  Final   Special Requests RUL  Final   Gram Stain NO WBC SEEN NO ORGANISMS SEEN   Final   Culture   Final    NO GROWTH 2 DAYS Performed at [facility], [address], [place], [place] [address]    Report Status 02/14/2023 FINAL  Final  Acid Fast Culture with reflexed sensitivities     Status: None   Collection Time: 02/12/23  2:43 PM   Specimen: Bronchial Alveolar Lavage; Respiratory  Result Value Ref Range Status   Acid Fast Culture Negative  Final    Comment: (NOTE) No acid fast bacilli isolated after 6 weeks. Performed At: [facility] [address] [place], [place] [id number] Jolene Schimke MD WU:[id number]    Source of Sample BRONCHIAL ALVEOLAR LAVAGE  Final    Comment: RUL Performed at [facility], [address] [place], [place] [address]   Acid Fast Smear (AFB)     Status: None   Collection Time: 02/12/23  2:43 PM   Specimen: Bronchial Alveolar Lavage; Respiratory  Result Value Ref Range Status   AFB Specimen Processing Concentration  Final   Acid Fast Smear  Negative  Final    Comment: (NOTE) Performed At: [facility] [address] [place], [place] [id number] Jolene Schimke MD NG:[id number]    Source (AFB) BRONCHIAL ALVEOLAR LAVAGE  Final    Comment: RUL Performed at [facility], [address], [place], [place] [address]   Fungus Culture Result     Status: None   Collection Time: 02/12/23  2:43 PM  Result Value Ref Range Status   Result 1 Comment  Final    Comment: (NOTE) KOH/Calcofluor preparation:  no fungus observed. Performed At: [facility] [address] [place], [place] [id number] Jolene Schimke MD HK:[id number]   Fungal organism reflex     Status: None   Collection Time: 02/12/23  2:43 PM  Result Value Ref Range Status   Fungal result 1 Comment  Final    Comment: (NOTE) No yeast or mold isolated after 4 weeks. Performed At: [facility] [address] [place], [place] [id number] Jolene Schimke MD OA:[id number]     Disposition: Home  Discharge instruction: The patient was instructed to be ambulatory but told to refrain from heavy lifting, strenuous activity, or driving.   Discharge medications:  Allergies as of 04/07/2023       Reactions   Fosamax [alendronate Sodium] Other (See Comments)   Aches and pains        Medication List     TAKE these medications    acetaminophen 500 MG tablet Commonly known as: TYLENOL Take 2 tablets (1,000 mg total) by mouth every 6 (six) hours for 7 days.   amLODipine 5 MG tablet Commonly known as: NORVASC Take 5 mg by mouth daily.   amoxicillin 875 MG tablet Commonly known as: AMOXIL Take 875 mg by mouth 2 (two) times daily.   Ensure Take 237 mLs by mouth 2 (two) times daily between meals.   Iron (Ferrous Sulfate) 325 (65 Fe) MG Tabs Take 325 mg by mouth daily.   pantoprazole 40 MG tablet Commonly known as: PROTONIX Take 1 tablet (40 mg total) by mouth 2 (two) times daily.   senna-docusate 8.6-50 MG tablet Commonly known as:  Senokot-S Take 1 tablet by mouth 2 (two) times daily. While taking strong pain meds to prevent constipation   traMADol 50 MG tablet Commonly known as: Ultram Take 1 tablet (50 mg total) by mouth every 6 (six) hours as needed.               Durable Medical Equipment  (From admission, onward)           Start     Ordered   04/06/23 1421  For home use only DME Bedside commode  Once       Question:  Patient needs a bedside commode to treat with the following condition  Answer:  Impaired ambulation   04/06/23 1424   04/06/23 1204  For home use only  DME Walker rolling  Once       Question Answer Comment  Walker: With 5 Inch Wheels   Patient needs a walker to treat with the following condition Impaired mobility and activities of daily living      04/06/23 1206            Followup:   Follow-up Information     Berneice Heinrich Delbert Phenix., MD Follow up on 04/22/2023.   Specialty: Urology Why: at 2pm for MD visit and pathology review. Contact information: [address] [place] [address] [phone]

## 2023-04-07 NOTE — Progress Notes (Signed)
Resumed care of patient from [place], [place]. Pt with discharge orders in place. DC instructions reviewed by previous RN. Awaiting ride at noon. Pt with no needs at this time. Agree with previous RN assessment. Will continue to monitor.

## 2023-04-07 NOTE — Progress Notes (Signed)
No BM since 04/02/23 per patient. Pt is passing gas, tolerating food. MD Pace aware, no new orders. OK to discharge per MD.

## 2023-04-07 NOTE — Plan of Care (Signed)
  Problem: Urinary Elimination: Goal: Ability to avoid or minimize complications of infection will improve Outcome: Progressing   Problem: Urinary Elimination: Goal: Ability to achieve and maintain urine output will improve Outcome: Progressing   Problem: Clinical Measurements: Goal: Postoperative complications will be avoided or minimized Outcome: Progressing   Problem: Safety: Goal: Ability to remain free from injury will improve Outcome: Progressing   Problem: Pain Management: Goal: General experience of comfort will improve Outcome: Progressing   Problem: Education: Goal: Knowledge of the procedure and recovery process will improve Outcome: Progressing

## 2023-04-07 NOTE — Progress Notes (Signed)
Urology Inpatient Progress Report  Renal mass, right [N28.89]  Procedure(s): XI ROBOTIC ASSISTED RIGHT LAPAROSCOPIC RADICAL NEPHRECTOMY  4 Days Post-Op   Intv/Subj: No acute events overnight. Patient is without complaint. Worked with PT yesterday. No flatus but feels like she need to have bm.  Principal Problem:   Renal mass, right Active Problems:   Anemia in neoplastic disease  Current Facility-Administered Medications  Medication Dose Route Frequency Provider Last Rate Last Admin   amLODipine (NORVASC) tablet 5 mg  5 mg Oral Daily Zettie Pho, MD       Chlorhexidine Gluconate Cloth 2 % PADS 6 each  6 each Topical Daily Berneice Heinrich Delbert Phenix., MD   6 each at 04/05/23 1031   docusate sodium (COLACE) capsule 100 mg  100 mg Oral BID Zettie Pho, MD   100 mg at 04/06/23 2129   feeding supplement (ENSURE ENLIVE / ENSURE PLUS) liquid 237 mL  237 mL Oral BID BM Loletta Parish., MD   237 mL at 04/06/23 1130   hydrocortisone (ANUSOL-HC) 2.5 % rectal cream   Rectal BID Milderd Meager., MD   Given at 04/06/23 2129   HYDROmorphone (DILAUDID) injection 0.5-1 mg  0.5-1 mg Intravenous Q2H PRN Zettie Pho, MD   1 mg at 04/05/23 2141   ondansetron (ZOFRAN) injection 4 mg  4 mg Intravenous Q4H PRN Zettie Pho, MD   4 mg at 04/06/23 2309   Oral care mouth rinse  15 mL Mouth Rinse PRN Loletta Parish., MD       phenol (CHLORASEPTIC) mouth spray 1 spray  1 spray Mouth/Throat PRN Loletta Parish., MD       traMADol Janean Sark) tablet 50 mg  50 mg Oral Q6H Milderd Meager., MD   50 mg at 04/07/23 0640     Objective: Vital: Vitals:   04/06/23 1202 04/06/23 1811 04/06/23 2115 04/07/23 0517  BP: 135/85 (!) 148/91 (!) 145/90 (!) 150/90  Pulse: 100 99 (!) 109 98  Resp: 14 18 16  (!) 24  Temp: (!) 97.5 F (36.4 C) 97.9 F (36.6 C) 98 F (36.7 C) 97.8 F (36.6 C)  TempSrc: Oral Oral Oral Oral  SpO2: 98% 97% 98% 92%  Weight:      Height:       I/Os: I/O last 3  completed shifts: In: 1297 [P.O.:1297] Out: 3200 [Urine:3200]  Physical Exam:  General: Patient is in no apparent distress Lungs: Normal respiratory effort, chest expands symmetrically. GI: Incisions are c/d/i.  The abdomen is soft and appropriate tender to palpation Foley: removed  Ext: lower extremities symmetric  Lab Results: Recent Labs    04/06/23 0421  WBC 13.5*  HGB 9.5*  HCT 31.7*   Recent Labs    04/06/23 1710  NA 134*  K 4.4  CL 101  CO2 24  GLUCOSE 119*  BUN 17  CREATININE 1.22*  CALCIUM 8.1*   No results for input(s): "LABPT", "INR" in the last 72 hours. No results for input(s): "LABURIN" in the last 72 hours. Results for orders placed or performed during the hospital encounter of 02/12/23  Fungus Culture With Stain     Status: None   Collection Time: 02/12/23  2:43 PM   Specimen: Bronchial Alveolar Lavage; Respiratory  Result Value Ref Range Status   Fungus Stain Final report  Final   Fungus (Mycology) Culture Final report  Final    Comment: (NOTE) Performed At: [facility] [address] [place], [place] [id number] Clovis Riley  Claudie Fisherman MD HY:[id number]    Fungal Source BRONCHIAL ALVEOLAR LAVAGE  Final    Comment: RUL Performed at [facility], [address] [place], [place] [address]   Culture, BAL-quantitative w Gram Stain     Status: None   Collection Time: 02/12/23  2:43 PM   Specimen: Bronchial Alveolar Lavage; Respiratory  Result Value Ref Range Status   Specimen Description BRONCHIAL ALVEOLAR LAVAGE  Final   Special Requests RUL  Final   Gram Stain NO WBC SEEN NO ORGANISMS SEEN   Final   Culture   Final    NO GROWTH 2 DAYS Performed at [facility], [address] [place], [place] [address]    Report Status 02/14/2023 FINAL  Final  Acid Fast Culture with reflexed sensitivities     Status: None   Collection Time: 02/12/23  2:43 PM   Specimen: Bronchial Alveolar Lavage; Respiratory  Result Value Ref Range Status    Acid Fast Culture Negative  Final    Comment: (NOTE) No acid fast bacilli isolated after 6 weeks. Performed At: [facility] [address] [place], [place] [id number] Jolene Schimke MD UY:[id number]    Source of Sample BRONCHIAL ALVEOLAR LAVAGE  Final    Comment: RUL Performed at [facility], [address] [place], [place] [address]   Acid Fast Smear (AFB)     Status: None   Collection Time: 02/12/23  2:43 PM   Specimen: Bronchial Alveolar Lavage; Respiratory  Result Value Ref Range Status   AFB Specimen Processing Concentration  Final   Acid Fast Smear Negative  Final    Comment: (NOTE) Performed At: [facility] [address] [place], [place] [id number] Jolene Schimke MD AC:[id number]    Source (AFB) BRONCHIAL ALVEOLAR LAVAGE  Final    Comment: RUL Performed at [facility], [address] [place], [place] [address]   Fungus Culture Result     Status: None   Collection Time: 02/12/23  2:43 PM  Result Value Ref Range Status   Result 1 Comment  Final    Comment: (NOTE) KOH/Calcofluor preparation:  no fungus observed. Performed At: [facility] [address] [place], [place] [id number] Jolene Schimke MD RK:[id number]   Fungal organism reflex     Status: None   Collection Time: 02/12/23  2:43 PM  Result Value Ref Range Status   Fungal result 1 Comment  Final    Comment: (NOTE) No yeast or mold isolated after 4 weeks. Performed At: [facility] [address] [place], [place] [id number] Jolene Schimke MD VP:[id number]     Studies/Results: No results found.  Assessment/Plan: POD#4 s/p robotic assisted laparoscopic RIGHT radical nephrectomy -Evaluated by PT yesterday and given roller walker and bedside commode for discharge -Advance diet as tolerated -Tramadol as needed pain -discharge today   Kasandra Knudsen, MD Urology 04/07/2023, 10:05 AM

## 2023-04-08 ENCOUNTER — Encounter: Payer: Self-pay | Admitting: Adult Health

## 2023-04-08 ENCOUNTER — Telehealth: Payer: Self-pay | Admitting: *Deleted

## 2023-04-08 LAB — SURGICAL PATHOLOGY

## 2023-04-08 NOTE — Transitions of Care (Post Inpatient/ED Visit) (Signed)
04/08/2023  Name: Diane Paul MRN: [id number] DOB: July 19, 1940  Today's TOC FU Call Status: Today's TOC FU Call Status:: Successful TOC FU Call Completed TOC FU Call Complete Date: 04/08/23 Patient's Name and Date of Birth confirmed. Patient agreed to enrollment in 30 day program  Transition Care Management Follow-up Telephone Call Date of Discharge: 04/07/23 Discharge Facility: [facility]) Type of Discharge: Inpatient Admission Primary Inpatient Discharge Diagnosis:: Renal mass, lap radical nephrectomy How have you been since you were released from the hospital?: Better (reports "feeling better", urinating well, appetite is better) Any questions or concerns?: No  Items Reviewed: Did you receive and understand the discharge instructions provided?: Yes Medications obtained,verified, and reconciled?: Yes (Medications Reviewed) Any new allergies since your discharge?: No Dietary orders reviewed?: Yes Type of Diet Ordered:: low sodium heart healthy Do you have support at home?: Yes People in Home: child(ren), adult Name of Support/Comfort Primary Source: adult daughter lives with patient, another adult daughter Cardelia Rochlin and adult son are also assisting now and staying with patient so she has 24 hour care   Goals Addressed             This Visit's Progress    Transitions of Care/ pt will have no readmissions within 30 days       Current Barriers:  Knowledge Deficits related to plan of care for management of post op right lap radical nephrectomy  Spoke with patient and permission give to speak with adult daughter Laqunda Gatt who reports pt is doing well, has all medications and taking as prescribed, has all specialist follow up appointments scheduled, pain is controlled well, has not needed to take tramadol, no signs/ symptoms of infection reported  RNCM Clinical Goal(s):  Patient will verbalize understanding of plan for management of post op right lap radical  nephrectomy as evidenced by patient report, review of EMR take all medications exactly as prescribed and will call provider for medication related questions as evidenced by patient report, review of EMR attend all scheduled medical appointments: urology and CT lung biopsy as evidenced by patient report, review of EMR and  through collaboration with RN Care manager, provider, and care team.   Interventions: Evaluation of current treatment plan related to  self management and patient's adherence to plan as established by provider   Post op right lap radical nephrectomy  (Status:  New goal. and Goal on track:  Yes.)  Short Term Goal Evaluation of current treatment plan related to  post op surgery , ADL IADL limitations self-management and patient's adherence to plan as established by provider. Discussed plans with patient for ongoing care management follow up and provided patient with direct contact information for care management team Evaluation of current treatment plan related to post op surgery and recuperation, pain management and patient's adherence to plan as established by provider Provided education to patient re: signs/ symptoms of infection Reviewed medications with patient and discussed importance of taking as prescribed Discussed plans with patient for ongoing care management follow up and provided patient with direct contact information for care management team Assessed social determinant of health barriers Reviewed all upcoming scheduled appointments Reviewed discharge instructions, verified pt has DME- walker and BSC Reviewed safety precautions  Patient Goals/Self-Care Activities: Participate in Transition of Care Program/Attend [facility] scheduled calls Notify RN Care Manager of TOC call rescheduling needs Take all medications as prescribed Attend all scheduled provider appointments Call pharmacy for medication refills 3-7 days in advance of running out of medications  Call provider  office for new concerns or questions  Follow up with urology and CT lung biopsy, these are listed on your after visit summary  Follow Up Plan:  Telephone follow up appointment with care management team member scheduled for:  04/16/23 at 1 pm           Medications Reviewed Today: Medications Reviewed Today     Reviewed by Audrie Gallus, RN (Registered Nurse) on 04/08/23 at 1112  Med List Status: <None>   Medication Order Taking? Sig Documenting Provider Last Dose Status Informant  acetaminophen (TYLENOL) 500 MG tablet [id number] Yes Take 2 tablets (1,000 mg total) by mouth every 6 (six) hours for 7 days. Loletta Parish., MD Taking Active   amLODipine (NORVASC) 5 MG tablet [id number] No Take 5 mg by mouth daily.  Patient not taking: Reported on 04/08/2023   [provider] Not Taking Active Family Member  amoxicillin (AMOXIL) 875 MG tablet [id number] No Take 875 mg by mouth 2 (two) times daily.  Patient not taking: Reported on 04/08/2023   [provider] Not Taking Active Family Member  Ensure [facility]) [id number] Yes Take 237 mLs by mouth 2 (two) times daily between meals. [provider] Taking Active Family Member  Iron, Ferrous Sulfate, 325 (65 Fe) MG TABS [id number] Yes Take 325 mg by mouth daily. Donita Brooks, MD Taking Active Family Member  pantoprazole (PROTONIX) 40 MG tablet [id number]  Take 1 tablet (40 mg total) by mouth 2 (two) times daily.  Patient not taking: Reported on 03/26/2023   Lanae Boast, MD  Expired 03/08/23 2359   senna-docusate (SENOKOT-S) 8.6-50 MG tablet [id number] Yes Take 1 tablet by mouth 2 (two) times daily. While taking strong pain meds to prevent constipation Berneice Heinrich Delbert Phenix., MD Taking Active   traMADol Janean Sark) 50 MG tablet [id number] Yes Take 1 tablet (50 mg total) by mouth every 6 (six) hours as needed. Noel Christmas, MD Taking Active             Home Care and Equipment/Supplies: Were Home Health Services  Ordered?: No Any new equipment or medical supplies ordered?: Yes Name of Medical supply agency?: pt unsure of name, states she has new walker and BSC Were you able to get the equipment/medical supplies?: Yes Do you have any questions related to the use of the equipment/supplies?: No  Functional Questionnaire: Do you need assistance with bathing/showering or dressing?: Yes (daughter assists) Do you need assistance with meal preparation?: Yes (daughters assist) Do you need assistance with eating?: No Do you have difficulty maintaining continence: No Do you need assistance with getting out of bed/getting out of a chair/moving?: Yes (uses walker) Do you have difficulty managing or taking your medications?: Yes (daughters provide oversight)  Follow up appointments reviewed: PCP Follow-up appointment confirmed?: No (daughter states pt has so many appointments at present with specialists, they are going to wait and follow up with PCP at later date) MD Provider Line [phone] Given: No Specialist Hospital Follow-up appointment confirmed?: Yes Date of Specialist follow-up appointment?: 04/11/23 Follow-Up Specialty Provider:: Dr. Cherly Hensen  surgeon @ 1230 pm and will have labs completed per daughter,  12/16 Dr. Berneice Heinrich urologist  @ 2 pm,  to have CT lung biopsy on 04/10/23 Do you need transportation to your follow-up appointment?: No (family provides transportation) Do you understand care options if your condition(s) worsen?: Yes-patient verbalized understanding  SDOH Interventions Today    Flowsheet Row Most Recent Value  SDOH Interventions  Food Insecurity Interventions Intervention Not Indicated  Housing Interventions Intervention Not Indicated  Transportation Interventions Intervention Not Indicated  Utilities Interventions Intervention Not Indicated      Irving Shows [facility], BSN RN Care Manager/ Transition of Care [place]/ [facility] [phone]

## 2023-04-09 ENCOUNTER — Ambulatory Visit: Payer: Self-pay | Admitting: Family Medicine

## 2023-04-09 ENCOUNTER — Telehealth: Payer: Self-pay

## 2023-04-09 NOTE — Telephone Encounter (Signed)
Copied from CRM [id number]. Topic: Clinical - Medical Advice >> Apr 09, 2023  9:07 AM Desma Mcgregor wrote: Reason for CRM: Patient had surgery day before Thanksgiving and hasn't had a bowel movement since she left Sunday. Pt is wondering if she can take something stronger to get the bowels moving? Cb# [phone]

## 2023-04-09 NOTE — Telephone Encounter (Signed)
Copied from CRM [id number]. Topic: Clinical - Medical Advice >> Apr 09, 2023 12:37 PM Diane Paul wrote: Reason for CRM: Patient had surgery day before Thanksgiving and hasn't had a bowel movement since she left Sunday. Pt is wondering if she can take something stronger to get the bowels moving? Cb# [phone] 2nd call for day

## 2023-04-09 NOTE — Telephone Encounter (Signed)
Copied from CRM [id number]. Topic: Clinical - Pink Word Triage >> Apr 09, 2023  3:18 PM Dominique A wrote: Reason for Triage: Pt did not have a bowel moment since Thanksgiving   Chief Complaint: Constipation Symptoms: no bowel movement since prior to surgery 04/03/23 Frequency: ongoing Pertinent Negatives: Patient denies bloating or pain Disposition: [] ED /[] Urgent Care (no appt availability in office) / [] Appointment(In office/virtual)/ []  [place] [facility]/ [] Home Care/ [] Refused Recommended Disposition /[] [place] [facility]/ [x]  Follow-up with PCP Additional Notes: The patient's family member is concerned because the patient has not had a bowel movement since 04/03/23 prior to surgery.  She has been taking a stool softener twice daily.  The family member inquired if the patient may take Magnesium Citrate.  She also inquired if the patient should continue taking her iron pill since she is receiving iron infusion.  The patient has an appointment tomorrow with another provider and plans to speak with them about it if her pcp is unable to follow up today.  She stated that Miralax has not worked in the past.  Please advise.    Reason for Disposition  Last bowel movement (BM) > 4 days ago  Answer Assessment - Initial Assessment Questions 1. STOOL PATTERN OR FREQUENCY: "How often do you have a bowel movement (BM)?"  (Normal range: 3 times a day to every 3 days)  "When was your last BM?"       Surgery 04/03/23 and has not had a bowel movement since.  The patient was sent home with a stool softener Senexon-S 50-8.6 mg twice daily  Can the patient take Magnesium Citrate? Before surgery 1 iron infusion and she is scheduled for more.  Should she continue taking the iron pill as well.    Sometimes she goes up to 3 days to have a bowel movement due to iron pill  2. ACTIVITY:  "How much walking do you do every day?"  "Has your activity level decreased in the past week?"        Using a walker able  to get around really well  3. CAUSE: "What do you think is causing the constipation?"        Combination of iron infusion, anesthesia  4. OTHER SYMPTOMS: "Do you have any other symptoms?" (e.g., abdomen pain, bloating, fever, vomiting)       No pain no bloating  Protocols used: Constipation-A-AH

## 2023-04-10 ENCOUNTER — Ambulatory Visit (HOSPITAL_COMMUNITY)
Admission: RE | Admit: 2023-04-10 | Discharge: 2023-04-10 | Disposition: A | Discharge status: Home / Self Care | Payer: Medicare HMO | Source: Ambulatory Visit

## 2023-04-10 ENCOUNTER — Other Ambulatory Visit: Payer: Self-pay

## 2023-04-10 ENCOUNTER — Encounter (HOSPITAL_COMMUNITY): Payer: Self-pay

## 2023-04-10 ENCOUNTER — Ambulatory Visit (HOSPITAL_COMMUNITY): Payer: Medicare HMO

## 2023-04-10 ENCOUNTER — Other Ambulatory Visit: Payer: Self-pay | Admitting: Radiology

## 2023-04-10 DIAGNOSIS — J939 Pneumothorax, unspecified: Secondary | ICD-10-CM | POA: Diagnosis not present

## 2023-04-10 DIAGNOSIS — R918 Other nonspecific abnormal finding of lung field: Secondary | ICD-10-CM

## 2023-04-10 DIAGNOSIS — Z853 Personal history of malignant neoplasm of breast: Secondary | ICD-10-CM | POA: Insufficient documentation

## 2023-04-10 DIAGNOSIS — Z905 Acquired absence of kidney: Secondary | ICD-10-CM | POA: Diagnosis not present

## 2023-04-10 DIAGNOSIS — Z79899 Other long term (current) drug therapy: Secondary | ICD-10-CM | POA: Diagnosis not present

## 2023-04-10 DIAGNOSIS — Z9889 Other specified postprocedural states: Secondary | ICD-10-CM | POA: Insufficient documentation

## 2023-04-10 DIAGNOSIS — Z5309 Procedure and treatment not carried out because of other contraindication: Secondary | ICD-10-CM | POA: Insufficient documentation

## 2023-04-10 DIAGNOSIS — E041 Nontoxic single thyroid nodule: Secondary | ICD-10-CM | POA: Diagnosis not present

## 2023-04-10 DIAGNOSIS — Z01811 Encounter for preprocedural respiratory examination: Secondary | ICD-10-CM | POA: Diagnosis not present

## 2023-04-10 DIAGNOSIS — J90 Pleural effusion, not elsewhere classified: Secondary | ICD-10-CM | POA: Diagnosis not present

## 2023-04-10 LAB — CBC
HCT: 36.3 % (ref 36.0–46.0)
Hemoglobin: 10.5 g/dL — ABNORMAL LOW (ref 12.0–15.0)
MCH: 23.8 pg — ABNORMAL LOW (ref 26.0–34.0)
MCHC: 28.9 g/dL — ABNORMAL LOW (ref 30.0–36.0)
MCV: 82.1 fL (ref 80.0–100.0)
Platelets: 518 10*3/uL — ABNORMAL HIGH (ref 150–400)
RBC: 4.42 MIL/uL (ref 3.87–5.11)
RDW: 21.8 % — ABNORMAL HIGH (ref 11.5–15.5)
WBC: 15.3 10*3/uL — ABNORMAL HIGH (ref 4.0–10.5)
nRBC: 0 % (ref 0.0–0.2)

## 2023-04-10 LAB — PROTIME-INR
INR: 1.3 — ABNORMAL HIGH (ref 0.8–1.2)
Prothrombin Time: 16.1 s — ABNORMAL HIGH (ref 11.4–15.2)

## 2023-04-10 MED ORDER — MIDAZOLAM HCL 2 MG/2ML IJ SOLN
INTRAMUSCULAR | Status: AC
Start: 2023-04-10 — End: ?
  Filled 2023-04-10: qty 2

## 2023-04-10 MED ORDER — SODIUM CHLORIDE 0.9 % IV SOLN
INTRAVENOUS | Status: DC
Start: 2023-04-10 — End: 2023-04-11

## 2023-04-10 MED ORDER — FENTANYL CITRATE (PF) 100 MCG/2ML IJ SOLN
INTRAMUSCULAR | Status: AC
  Filled 2023-04-10: qty 2

## 2023-04-10 NOTE — Procedures (Signed)
Interventional Radiology Procedure Note  Procedure: Aborted CT guided lung biopsy  Findings: Please refer to procedural dictation for full description.  Mutlifocal bilateral progressive pulmonary masses with small right apical pneumothorax on preliminary scan.  Discussed case with Dr. Cherly Hensen.  Procedure aborted.    Complications: None  Estimated Blood Loss: None  Recommendations: Follow up with Oncology as scheduled.   Marliss Coots, MD

## 2023-04-10 NOTE — H&P (Signed)
Chief Complaint: Patient was seen in consultation today for right lung nodule (s) biopsy at the request of Chang,Rubens C  Referring Physician(s): Chang,Rubens C  Supervising Physician: Marliss Coots  Patient Status: [facility] - Out-[facility]  History of Present Illness: Diane Paul is a 82 y.o. female   FULL Code status per pt Hx Breast cancer Recent Hx Right renal radical nephrectomy 04/03/23  CXR 11/4 IMPRESSION: 1. Low volume film with perihilar atelectasis. No evidence for pneumothorax. 2. Small bilateral pulmonary nodules.  Bronch 03/11/23: FINAL MICROSCOPIC DIAGNOSIS:  C.  RIGHT LUNG, UPPER LOBE, TARGET #1, FINE NEEDLE ASPIRATION  BIOPSY:  - Atypical cells present, favor reactive bronchial cells  - Benign/reactive bronchial cells, pulmonary macrophages and  inflammatory cells  - Benign alveolated lung with focal fibrosis (cellblock)  D.  RIGHT LUNG, UPPER LOBE, TARGET #1, BRUSHING:  - Negative for malignancy  - Very scant cellularity; very rare benign bronchial cells   Request made for right lung nodule (s) biopsy per Oncology   Past Medical History:  Diagnosis Date   Anemia    Anxiety    nervous sometimes, denies panica ttacks    Arthritis    knees ?   Blood transfusion    post vaginal birth- 1960, 01/2023- anemia   Breast cancer (HCC)    Right Breast Cancer   Cancer (HCC)    R breast cancer   Dry mouth    GERD (gastroesophageal reflux disease)    Hypertension    Neuromuscular disorder (HCC)    L leg, thigh- "burns sometimes"   Shortness of breath    sometimes    Use of letrozole (Femara) 05/07/2010   neoadjuvant femara therapy since 1/212    Past Surgical History:  Procedure Laterality Date   ABDOMINAL HYSTERECTOMY     BIOPSY  02/03/2023   Procedure: BIOPSY;  Surgeon: Jeani Hawking, MD;  Location: [facility] ENDOSCOPY;  Service: Gastroenterology;;  gastric   BREAST LUMPECTOMY     BREAST SURGERY     Right   BRONCHIAL BIOPSY  02/12/2023   Procedure:  BRONCHIAL BIOPSIES;  Surgeon: Leslye Peer, MD;  Location: [facility] ENDOSCOPY;  Service: Pulmonary;;   BRONCHIAL BIOPSY  03/11/2023   Procedure: BRONCHIAL BIOPSIES;  Surgeon: Leslye Peer, MD;  Location: [facility] ENDOSCOPY;  Service: Pulmonary;;   BRONCHIAL BRUSHINGS  02/12/2023   Procedure: BRONCHIAL BRUSHINGS;  Surgeon: Leslye Peer, MD;  Location: [facility] ENDOSCOPY;  Service: Pulmonary;;   BRONCHIAL BRUSHINGS  03/11/2023   Procedure: BRONCHIAL BRUSHINGS;  Surgeon: Leslye Peer, MD;  Location: [facility] ENDOSCOPY;  Service: Pulmonary;;   BRONCHIAL NEEDLE ASPIRATION BIOPSY  02/12/2023   Procedure: BRONCHIAL NEEDLE ASPIRATION BIOPSIES;  Surgeon: Leslye Peer, MD;  Location: [facility] ENDOSCOPY;  Service: Pulmonary;;   BRONCHIAL NEEDLE ASPIRATION BIOPSY  03/11/2023   Procedure: BRONCHIAL NEEDLE ASPIRATION BIOPSIES;  Surgeon: Leslye Peer, MD;  Location: [facility] ENDOSCOPY;  Service: Pulmonary;;   BRONCHIAL WASHINGS  02/12/2023   Procedure: BRONCHIAL WASHINGS;  Surgeon: Leslye Peer, MD;  Location: [facility] ENDOSCOPY;  Service: Pulmonary;;   COLONOSCOPY WITH PROPOFOL N/A 02/03/2023   Procedure: COLONOSCOPY WITH PROPOFOL;  Surgeon: Jeani Hawking, MD;  Location: [facility] ENDOSCOPY;  Service: Gastroenterology;  Laterality: N/A;   ESOPHAGOGASTRODUODENOSCOPY (EGD) WITH PROPOFOL N/A 02/03/2023   Procedure: ESOPHAGOGASTRODUODENOSCOPY (EGD) WITH PROPOFOL;  Surgeon: Jeani Hawking, MD;  Location: [facility] ENDOSCOPY;  Service: Gastroenterology;  Laterality: N/A;   HEMOSTASIS CLIP PLACEMENT  02/03/2023   Procedure: HEMOSTASIS CLIP PLACEMENT;  Surgeon: Jeani Hawking, MD;  Location: [facility] ENDOSCOPY;  Service: Gastroenterology;;  ascending colon polyp   jp drains     s/p masectomy 06/11/11/ Dr. [name], 2 jp drains right chest wall   MASTECTOMY Right    malignant   MASTECTOMY W/ SENTINEL NODE BIOPSY  06/11/2011/Right BReast   Procedure: MASTECTOMY WITH SENTINEL LYMPH NODE BIOPSY;  Surgeon: Ernestene Mention, MD;  Location: [facility] OR;  Service: General;   Laterality: Right;  Right total mastectomy and sentinel lymph node biopsy using lymphatic mapping and blue dye injection.   POLYPECTOMY  02/03/2023   Procedure: POLYPECTOMY;  Surgeon: Jeani Hawking, MD;  Location: [facility] ENDOSCOPY;  Service: Gastroenterology;;  hot snare polypectomy ascending colon   ROBOT ASSISTED LAPAROSCOPIC NEPHRECTOMY Right 04/03/2023   Procedure: XI ROBOTIC ASSISTED RIGHT LAPAROSCOPIC RADICAL NEPHRECTOMY;  Surgeon: Loletta Parish., MD;  Location: [facility] ORS;  Service: Urology;  Laterality: Right;  180 MINUTES   SUBMUCOSAL LIFTING INJECTION  02/03/2023   Procedure: SUBMUCOSAL LIFTING INJECTION;  Surgeon: Jeani Hawking, MD;  Location: [facility] ENDOSCOPY;  Service: Gastroenterology;;  ever lift 16cc ascending colon   TUBAL LIGATION     [place] FINE NEEDLE ASPIRATION WO/ W SMEAR  05/22/10   Left Breast- cyst or abscess     Allergies: Fosamax [alendronate sodium]  Medications: Prior to Admission medications   Medication Sig Start Date End Date Taking? Authorizing Provider  acetaminophen (TYLENOL) 500 MG tablet Take 2 tablets (1,000 mg total) by mouth every 6 (six) hours for 7 days. 04/03/23 04/10/23 Yes Manny, Delbert Phenix., MD  Ensure (ENSURE) Take 237 mLs by mouth 2 (two) times daily between meals.   Yes [provider]  Iron, Ferrous Sulfate, 325 (65 Fe) MG TABS Take 325 mg by mouth daily. 01/14/23  Yes Donita Brooks, MD  senna-docusate (SENOKOT-S) 8.6-50 MG tablet Take 1 tablet by mouth 2 (two) times daily. While taking strong pain meds to prevent constipation 04/03/23  Yes Manny, Delbert Phenix., MD  amLODipine (NORVASC) 5 MG tablet Take 5 mg by mouth daily. Patient not taking: Reported on 04/08/2023    [provider]  amoxicillin (AMOXIL) 875 MG tablet Take 875 mg by mouth 2 (two) times daily. Patient not taking: Reported on 04/08/2023    [provider]  pantoprazole (PROTONIX) 40 MG tablet Take 1 tablet (40 mg total) by mouth 2 (two) times  daily. Patient not taking: Reported on 03/26/2023 02/06/23 03/08/23  Lanae Boast, MD  traMADol (ULTRAM) 50 MG tablet Take 1 tablet (50 mg total) by mouth every 6 (six) hours as needed. 04/07/23 04/06/24  Noel Christmas, MD     Family History  Problem Relation Age of Onset   Breast cancer Sister    Anesthesia problems Neg Hx    Hypotension Neg Hx    Malignant hyperthermia Neg Hx    Pseudochol deficiency Neg Hx     Social History   Socioeconomic History   Marital status: Single    Spouse name: Not on file   Number of children: 5   Years of education: Not on file   Highest education level: Not on file  Occupational History   Occupation: Retired  Tobacco Use   Smoking status: Never   Smokeless tobacco: Never  Vaping Use   Vaping status: Never Used  Substance and Sexual Activity   Alcohol use: No   Drug use: No   Sexual activity: Not Currently    Birth control/protection: Post-menopausal  Other Topics Concern   Not on file  Social History Narrative   Not on file   Social Determinants of Health   Financial Resource Strain: Low Risk  (03/25/2023)   Overall Financial Resource Strain (CARDIA)    Difficulty of Paying Living Expenses: Not hard at all  Food Insecurity: No Food Insecurity (04/08/2023)   Hunger Vital Sign    Worried About Running Out of Food in the Last Year: Never true    Ran Out of Food in the Last Year: Never true  Transportation Needs: No Transportation Needs (04/08/2023)   PRAPARE - Administrator, Civil Service (Medical): No    Lack of Transportation (Non-Medical): No  Physical Activity: Sufficiently Active (07/25/2022)   Exercise Vital Sign    Days of Exercise per Week: 7 days    Minutes of Exercise per Session: 150+ min  Stress: No Stress Concern Present (07/25/2022)   [employer] of Occupational Health - Occupational Stress Questionnaire    Feeling of Stress : Not at all  Social Connections: Socially Isolated (07/25/2022)   Social  Connection and Isolation Panel [NHANES]    Frequency of Communication with Friends and Family: More than three times a week    Frequency of Social Gatherings with Friends and Family: More than three times a week    Attends Religious Services: Never    Database administrator or Organizations: No    Attends Banker Meetings: Never    Marital Status: Divorced    Review of Systems: A 12 point ROS discussed and pertinent positives are indicated in the HPI above.  All other systems are negative.  Review of Systems  Constitutional:  Positive for activity change and fatigue. Negative for fever.  Respiratory:  Positive for cough. Negative for shortness of breath and stridor.   Cardiovascular:  Negative for chest pain.  Gastrointestinal:  Negative for nausea.  Psychiatric/Behavioral:  Negative for behavioral problems and confusion.     Vital Signs: BP (!) 123/92   Pulse (!) 108   Temp 98.1 F (36.7 C) (Oral)   Resp 18   Ht 5\' 7"  (1.702 m)   Wt 184 lb (83.5 kg)   SpO2 98%   BMI 28.82 kg/m   Advance Care Plan: The advanced care plan/surrogate decision maker was discussed at the time of visit and documented in the medical record.    Physical Exam Vitals reviewed.  HENT:     Mouth/Throat:     Mouth: Mucous membranes are moist.  Cardiovascular:     Rate and Rhythm: Normal rate and regular rhythm.     Heart sounds: Normal heart sounds.  Pulmonary:     Effort: Pulmonary effort is normal.     Breath sounds: Normal breath sounds.  Abdominal:     Palpations: Abdomen is soft.     Tenderness: There is no abdominal tenderness.  Musculoskeletal:        General: Normal range of motion.  Skin:    General: Skin is warm.  Neurological:     Mental Status: She is alert and oriented to person, place, and time.  Psychiatric:        Behavior: Behavior normal.     Imaging: DG C-ARM BRONCHOSCOPY  Result Date: 03/11/2023 C-ARM BRONCHOSCOPY: Fluoroscopy was utilized by the  requesting physician.  No radiographic interpretation.   DG Chest Port 1 View  Result Date: 03/11/2023 CLINICAL DATA:  Status post bronchoscopy. EXAM: PORTABLE CHEST 1 VIEW COMPARISON:  02/12/2023 FINDINGS: Low volume film. The cardio pericardial silhouette is enlarged.  Parahilar streaky opacity suggest atelectasis bilaterally. Small bilateral pulmonary nodules evident. No pneumothorax or evidence of pleural effusion. Telemetry leads overlie the chest. IMPRESSION: 1. Low volume film with perihilar atelectasis. No evidence for pneumothorax. 2. Small bilateral pulmonary nodules. Electronically Signed   By: Kennith Center M.D.   On: 03/11/2023 10:53    Labs:  CBC: Recent Labs    03/29/23 0813 04/03/23 1045 04/03/23 1434 04/03/23 1608 04/04/23 0312 04/06/23 0421  WBC 19.3* 17.3*  --   --  17.6* 13.5*  HGB 7.5* 6.5* 9.2* 9.1* 9.1* 9.5*  HCT 26.9* 22.4* 27.0* 29.7* 29.2* 31.7*  PLT 595* 505*  --   --  557* 411*    COAGS: Recent Labs    02/02/23 0941  INR 1.6*    BMP: Recent Labs    03/29/23 0813 04/03/23 1434 04/03/23 1608 04/04/23 0312 04/06/23 1710  NA 134* 135 134* 133* 134*  K 4.1 4.1 3.9 4.1 4.4  CL 98  --  100 99 101  CO2 23  --  23 25 24   GLUCOSE 156*  --  177* 181* 119*  BUN 12  --  10 13 17   CALCIUM 8.3*  --  8.0* 8.0* 8.1*  CREATININE 0.73  --  0.87 0.97 1.22*  GFRNONAA >60  --  >60 58* 44*    LIVER FUNCTION TESTS: Recent Labs    01/11/23 0932 02/01/23 1109 02/01/23 1515 02/15/23 1152  BILITOT 0.4 0.4 0.7 0.4  AST 20 15 19 18   ALT 17 15 20 26   ALKPHOS  --   --  80  --   PROT 6.9 6.5  6.4 7.1 6.4  ALBUMIN  --   --  1.9*  --     TUMOR MARKERS: No results for input(s): "AFPTM", "CEA", "CA199", "CHROMGRNA" in the last 8760 hours.  Assessment and Plan:  Scheduled today for right lung nodule(s) biopsy Risks and benefits of CT guided lung nodule biopsy was discussed with the patient including, but not limited to bleeding, hemoptysis, respiratory  failure requiring intubation, infection, pneumothorax requiring chest tube placement, stroke from air embolism or even death.  All of the patient's questions were answered and the patient is agreeable to proceed.  Consent signed and in chart.  Thank you for this interesting consult.  I greatly enjoyed meeting Diane Paul and look forward to participating in their care.  A copy of this report was sent to the requesting provider on this date.  Electronically Signed: Robet Leu, PA-C 04/10/2023, 6:55 AM   I spent a total of  30 Minutes   in face to face in clinical consultation, greater than 50% of which was counseling/coordinating care for right lung nodule(s) biopsy

## 2023-04-10 NOTE — Sedation Documentation (Signed)
Dr. Elby Showers spoke with patient about cancellation. Ordering provider also aware. Patient understands and to be discharged at this time.

## 2023-04-10 NOTE — Sedation Documentation (Signed)
Discharge instructions reviewed with patient and family members. Patient verbalizes understanding and has no further questions at this time. Vital signs stable, A&O x 4 .

## 2023-04-10 NOTE — Assessment & Plan Note (Addendum)
Recommend start lenvatinib with pembrolizumab Will start lenvatinib at 10 mg daily for the first 3 weeks and reassess. Can start lenvatinib by 12/19, will be over 2 weeks from surgery if wound continues to heal well Follow up with me on 1/2 with lab. Restaging CT CAP and MRI brain

## 2023-04-10 NOTE — Assessment & Plan Note (Signed)
Improving post-nephrectomy IV iron venofer

## 2023-04-10 NOTE — Progress Notes (Unsigned)
Patient Care Team: Donita Brooks, MD as PCP - General (Family Medicine)  Clinic Day:  04/11/2023  Referring physician: Donita Brooks, MD  ASSESSMENT & PLAN:   Assessment & Plan: 82 y.o.w. 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  Diagnosis: stage IV clear cell RCC Treatment: Right nephrectomy.  Discussed pathology results show high-grade RCC.  Recommend starting systemic treatment as soon as possible.  Spoke to radiology, there appeared progression on CT chest and likelihood of pneumothorax therefore repeat biopsy is not safe.  We discussed systemic treatment today.  Results from CLEAR trial favored combination of lenvatinib plus pembrolizumab over sunitinib. Progression-free survival was longer with lenvatinib plus pembrolizumab than with sunitinib (median, 23.9 vs. 9.2 months).  Lenvatinib is given by mouth daily.  Pembrolizumab is given through IV every 3 weeks.  Side effects of lenvatinib may include fatigue, diarrhea, arthralgia, myalgia, decreased appetite, nausea, vomiting, hypothyroidism, hypertension, mouth sores, rash, weight loss, dysphonia, proteinuria, hand foot syndrome, abdominal pain, bleeding, constipation, liver toxicity, headaches, kidney injury, blood clots, difficulty with wound healing, and rare osteonecrosis of the jaw, encephalopathy, heart failure, arrhythmia have been reported  Side effects of immunotherapy are mostly related to immune related reaction.  They depend on which organ may be affected.  Patient can have hyper or hypothyroidism, skin rash, fever, pneumonitis, hepatitis, carditis, colitis, nephritis or other endorgan damage from immune related reaction.  Severe fatal reaction such as carditis or encephalitis has been reported. Rarely severe side effects can result in death.  After discussion Maeva understands and would like to proceed.    Metastatic renal cell carcinoma, right (HCC) Recommend start lenvatinib with pembrolizumab Will start lenvatinib at 10 mg daily for the first 3 weeks and reassess. Can start lenvatinib by 12/19, will be over 2 weeks from surgery if wound continues to heal well Follow up with me on 1/2 with lab. Restaging CT CAP and MRI brain  NSCLC of left lung (HCC) Given G4 RCC with rapid growing metastases, start treatment for mRCC and follow up imaging for NSCLC in 3 months with restaging.  Anemia in neoplastic disease Improving post-nephrectomy IV iron venofer  At risk for side effect of medication Drug monitoring for lenvatinib. Monitor for hypertension Baseline EKG LFT, renal function, electrolytes, every 2 weeks for 2 months and then monthly thereafter Baseline TSH and free T4 and then monthly urine dipstick; if 2+ then obtain a 24-hour urine protein Dental exam before and periodically Monitor for signs of bleeding, wound healing, thrombosis Follow up one week after starting lenvatinib.  Iron deficiency anemia IV Venofer x 2 more    The patient along with her son and daughter in the room understands the plans discussed today and is in agreement with them.  She knows to contact our office if she develops concerns prior to her next appointment.  [name], MD  [facility] - A DEPT [facility] [address] [place] [address] Dept: [phone] Dept Fax: [phone]   Orders Placed This Encounter  Procedures   CT CHEST ABDOMEN PELVIS WO CONTRAST    Standing Status:   Future    Standing Expiration Date:   04/10/2024    Order Specific Question:   Preferred imaging location?    Answer:   [facility]    Order  Specific Question:   If indicated for the ordered procedure, I authorize the administration of oral contrast media per Radiology protocol    Answer:   Yes    Order Specific Question:   Does  the patient have a contrast media/X-ray dye allergy?    Answer:   No   MR Brain W Wo Contrast    Standing Status:   Future    Standing Expiration Date:   04/10/2024    Order Specific Question:   If indicated for the ordered procedure, I authorize the administration of contrast media per Radiology protocol    Answer:   Yes    Order Specific Question:   What is the patient's sedation requirement?    Answer:   No Sedation    Order Specific Question:   Does the patient have a pacemaker or implanted devices?    Answer:   No    Order Specific Question:   Use SRS Protocol?    Answer:   Yes    Order Specific Question:   Preferred imaging location?    Answer:   [facility] (table limit - 550 lbs)   Total Protein, Urine dipstick    Standing Status:   Future    Standing Expiration Date:   04/24/2024   CBC with Differential (Cancer Center Only)    Standing Status:   Future    Standing Expiration Date:   04/24/2024   CMP (Cancer Center only)    Standing Status:   Future    Standing Expiration Date:   04/24/2024   T4    Standing Status:   Future    Standing Expiration Date:   04/24/2024   TSH    Standing Status:   Future    Standing Expiration Date:   04/24/2024   Lactate dehydrogenase    Standing Status:   Future    Standing Expiration Date:   04/24/2024   Ferritin    Standing Status:   Future    Standing Expiration Date:   04/24/2024   ECHOCARDIOGRAM COMPLETE    Standing Status:   Future    Standing Expiration Date:   04/10/2024    Order Specific Question:   Where should this test be performed    Answer:   [facility]    Order Specific Question:   Perflutren DEFINITY (image enhancing agent) should be administered unless hypersensitivity or allergy exist    Answer:   Administer Perflutren    Order Specific Question:   Reason for exam-Echo    Answer:   Chemo  Z09      CHIEF COMPLAINT:  CC: mRCC  Current Treatment: Lenvatinib with pembrolizumab to be started  INTERVAL  HISTORY:  Dwayna is here today for repeat clinical assessment. She is with Freida Busman and Plato. She denies fevers or chills.  Overall she reports feeling better.  Family reports she is walking.  Her energy is improved.  Wound is healing.  He denies any bleeding, coughing, chest pain or shortness of breath.  No new neurologic symptoms, headaches.  I have reviewed the past medical history, past surgical history, social history and family history with the patient and they are unchanged from previous note.  ALLERGIES:  is allergic to fosamax [alendronate sodium].  MEDICATIONS:  Current Outpatient Medications  Medication Sig Dispense Refill   lenvatinib 20 mg daily dose (LENVIMA) 2 x 10 MG capsule Take 2 capsules (20 mg total) by mouth daily. Start 1 (10 mg) daily for the first cycle  60 capsule 11   senna-docusate (SENOKOT-S) 8.6-50 MG tablet Take 1 tablet by mouth 2 (two) times daily. While taking strong pain meds to prevent constipation 10 tablet 0   amLODipine (NORVASC) 5 MG tablet Take 5 mg by mouth daily. (Patient not taking: Reported on 04/08/2023)     amoxicillin (AMOXIL) 875 MG tablet Take 875 mg by mouth 2 (two) times daily. (Patient not taking: Reported on 04/08/2023)     Ensure (ENSURE) Take 237 mLs by mouth 2 (two) times daily between meals.     Iron, Ferrous Sulfate, 325 (65 Fe) MG TABS Take 325 mg by mouth daily. (Patient not taking: Reported on 04/11/2023) 30 tablet 0   pantoprazole (PROTONIX) 40 MG tablet Take 1 tablet (40 mg total) by mouth 2 (two) times daily. (Patient not taking: Reported on 03/26/2023) 60 tablet 0   traMADol (ULTRAM) 50 MG tablet Take 1 tablet (50 mg total) by mouth every 6 (six) hours as needed. (Patient not taking: Reported on 04/11/2023) 20 tablet 0   No current facility-administered medications for this visit.    HISTORY OF PRESENT ILLNESS:   Oncology History  Breast cancer of upper-outer quadrant of right female breast (HCC)  06/11/2011 Surgery   Right breast  mastectomy with right axillary sentinel lymph node biopsy 1.3 cm invasive lobular cancer, grade 1, atypical lobular hyperplasia, focal LV I, ER 91%, PR 25%, HER-2 negative, 0/8 lymph nodes T1 cN0 stage IA   07/25/2011 -  Anti-estrogen oral therapy   Letrozole 2.5 mg daily 7 years was a plan   Metastatic renal cell carcinoma, right (HCC)  02/01/2023 Initial Diagnosis   Renal cell carcinoma (HCC)   02/05/2023 Imaging   Bone scan: No evidence of osseous metastatic disease.    04/03/2023 Pathology Results   A. KIDNEY, RIGHT, RADICAL NEPHRECTOMY:  Clear cell renal cell carcinoma with rhabdoid, sarcomatoid Features and  tumor necrosis, nuclear grade 4, size 12.0 cm  Tumor extends into perinephric, renal sinus fat and segmental branches  of renal vein (pT3a)  Ureteral, vascular and all margins of resection are negative for tumor    Benign adrenal gland    04/23/2023 - 04/23/2023 Chemotherapy   Patient is on Treatment Plan : RENAL CELL Pembrolizumab (200) + Axitinib q21d     04/25/2023 -  Chemotherapy   Patient is on Treatment Plan : BLADDER Pembrolizumab (200) q21d         REVIEW OF SYSTEMS:   All relevant systems were reviewed with the patient and are negative.   VITALS:  Blood pressure 126/76, pulse 98, temperature 98.1 F (36.7 C), resp. rate 18, weight 180 lb 12.8 oz (82 kg), SpO2 99%.  Wt Readings from Last 3 Encounters:  04/11/23 180 lb 12.8 oz (82 kg)  04/10/23 184 lb (83.5 kg)  04/03/23 197 lb 5 oz (89.5 kg)    Body mass index is 28.32 kg/m.  Performance status (ECOG): 2 - Symptomatic, <50% confined to bed  PHYSICAL EXAM:   GENERAL:alert, no distress and comfortable SKIN: skin color normal, no rashes  EYES: normal, sclera clear OROPHARYNX: no exudate, no erythema    NECK: supple,  non-tender, without nodularity LYMPH:  no palpable cervical lymphadenopathy LUNGS: clear to auscultation with normal breathing effort.  No wheeze or rales HEART: regular rate &  rhythm and no murmurs and no lower extremity edema ABDOMEN: abdomen soft, non-tender and nondistended Musculoskeletal: no edema NEURO: alert, fluent speech, no focal motor/sensory deficits.  Strength and sensation equal bilaterally.  LABORATORY  DATA:  I have reviewed the data as listed    Component Value Date/Time   NA 138 04/11/2023 1151   NA 141 08/05/2015 0952   K 4.8 04/11/2023 1151   K 4.1 08/05/2015 0952   CL 100 04/11/2023 1151   CL 108 (H) 09/19/2012 1156   CO2 31 04/11/2023 1151   CO2 24 08/05/2015 0952   GLUCOSE 97 04/11/2023 1151   GLUCOSE 109 08/05/2015 0952   GLUCOSE 101 (H) 09/19/2012 1156   BUN 16 04/11/2023 1151   BUN 20.9 08/05/2015 0952   CREATININE 1.31 (H) 04/11/2023 1151   CREATININE 0.72 03/08/2023 1236   CREATININE 1.1 08/05/2015 0952   CALCIUM 8.8 (L) 04/11/2023 1151   CALCIUM 9.4 08/05/2015 0952   PROT 6.9 04/11/2023 1151   PROT 7.8 08/05/2015 0952   ALBUMIN 3.0 (L) 04/11/2023 1151   ALBUMIN 3.7 08/05/2015 0952   AST 14 (L) 04/11/2023 1151   AST 16 08/05/2015 0952   ALT 14 04/11/2023 1151   ALT 17 08/05/2015 0952   ALKPHOS 101 04/11/2023 1151   ALKPHOS 104 08/05/2015 0952   BILITOT 0.4 04/11/2023 1151   BILITOT <0.30 08/05/2015 0952   GFRNONAA 41 (L) 04/11/2023 1151   GFRNONAA 39 (L) 08/12/2020 1502   GFRAA 45 (L) 08/12/2020 1502    No results found for: "SPEP", "UPEP"  Lab Results  Component Value Date   WBC 14.0 (H) 04/11/2023   NEUTROABS 9.4 (H) 04/11/2023   HGB 10.6 (L) 04/11/2023   HCT 35.3 (L) 04/11/2023   MCV 81.5 04/11/2023   PLT 515 (H) 04/11/2023      Chemistry      Component Value Date/Time   NA 138 04/11/2023 1151   NA 141 08/05/2015 0952   K 4.8 04/11/2023 1151   K 4.1 08/05/2015 0952   CL 100 04/11/2023 1151   CL 108 (H) 09/19/2012 1156   CO2 31 04/11/2023 1151   CO2 24 08/05/2015 0952   BUN 16 04/11/2023 1151   BUN 20.9 08/05/2015 0952   CREATININE 1.31 (H) 04/11/2023 1151   CREATININE 0.72 03/08/2023 1236    CREATININE 1.1 08/05/2015 0952      Component Value Date/Time   CALCIUM 8.8 (L) 04/11/2023 1151   CALCIUM 9.4 08/05/2015 0952   ALKPHOS 101 04/11/2023 1151   ALKPHOS 104 08/05/2015 0952   AST 14 (L) 04/11/2023 1151   AST 16 08/05/2015 0952   ALT 14 04/11/2023 1151   ALT 17 08/05/2015 0952   BILITOT 0.4 04/11/2023 1151   BILITOT <0.30 08/05/2015 0952       RADIOGRAPHIC STUDIES: I have personally reviewed the radiological images as listed and agreed with the findings in the report. No results found.

## 2023-04-10 NOTE — Assessment & Plan Note (Signed)
Given G4 RCC with rapid growing metastases, start treatment for mRCC and follow up imaging for NSCLC in 3 months with restaging.

## 2023-04-10 NOTE — Telephone Encounter (Signed)
Spoke w/pt's, daughter re: notes and recommendations. Daugher voiced understanding.

## 2023-04-11 ENCOUNTER — Telehealth: Payer: Self-pay | Admitting: Pharmacy Technician

## 2023-04-11 ENCOUNTER — Telehealth: Payer: Self-pay

## 2023-04-11 ENCOUNTER — Encounter: Payer: Self-pay | Admitting: Adult Health

## 2023-04-11 ENCOUNTER — Inpatient Hospital Stay: Payer: Medicare HMO

## 2023-04-11 ENCOUNTER — Other Ambulatory Visit (HOSPITAL_COMMUNITY): Payer: Self-pay

## 2023-04-11 VITALS — BP 126/76 | HR 98 | Temp 98.1°F | Resp 18 | Wt 180.8 lb

## 2023-04-11 DIAGNOSIS — C7801 Secondary malignant neoplasm of right lung: Secondary | ICD-10-CM | POA: Diagnosis not present

## 2023-04-11 DIAGNOSIS — C3412 Malignant neoplasm of upper lobe, left bronchus or lung: Secondary | ICD-10-CM | POA: Insufficient documentation

## 2023-04-11 DIAGNOSIS — K5900 Constipation, unspecified: Secondary | ICD-10-CM | POA: Insufficient documentation

## 2023-04-11 DIAGNOSIS — D630 Anemia in neoplastic disease: Secondary | ICD-10-CM | POA: Insufficient documentation

## 2023-04-11 DIAGNOSIS — N183 Chronic kidney disease, stage 3 unspecified: Secondary | ICD-10-CM

## 2023-04-11 DIAGNOSIS — C7802 Secondary malignant neoplasm of left lung: Secondary | ICD-10-CM | POA: Diagnosis not present

## 2023-04-11 DIAGNOSIS — C641 Malignant neoplasm of right kidney, except renal pelvis: Secondary | ICD-10-CM

## 2023-04-11 DIAGNOSIS — D500 Iron deficiency anemia secondary to blood loss (chronic): Secondary | ICD-10-CM

## 2023-04-11 DIAGNOSIS — C3492 Malignant neoplasm of unspecified part of left bronchus or lung: Secondary | ICD-10-CM | POA: Diagnosis not present

## 2023-04-11 DIAGNOSIS — Z9189 Other specified personal risk factors, not elsewhere classified: Secondary | ICD-10-CM | POA: Diagnosis not present

## 2023-04-11 DIAGNOSIS — Z853 Personal history of malignant neoplasm of breast: Secondary | ICD-10-CM | POA: Diagnosis not present

## 2023-04-11 DIAGNOSIS — Z5112 Encounter for antineoplastic immunotherapy: Secondary | ICD-10-CM | POA: Diagnosis present

## 2023-04-11 DIAGNOSIS — Z905 Acquired absence of kidney: Secondary | ICD-10-CM | POA: Diagnosis not present

## 2023-04-11 LAB — CMP (CANCER CENTER ONLY)
ALT: 14 U/L (ref 0–44)
AST: 14 U/L — ABNORMAL LOW (ref 15–41)
Albumin: 3 g/dL — ABNORMAL LOW (ref 3.5–5.0)
Alkaline Phosphatase: 101 U/L (ref 38–126)
Anion gap: 7 (ref 5–15)
BUN: 16 mg/dL (ref 8–23)
CO2: 31 mmol/L (ref 22–32)
Calcium: 8.8 mg/dL — ABNORMAL LOW (ref 8.9–10.3)
Chloride: 100 mmol/L (ref 98–111)
Creatinine: 1.31 mg/dL — ABNORMAL HIGH (ref 0.44–1.00)
GFR, Estimated: 41 mL/min — ABNORMAL LOW (ref >60)
Glucose, Bld: 97 mg/dL (ref 70–99)
Potassium: 4.8 mmol/L (ref 3.5–5.1)
Sodium: 138 mmol/L (ref 135–145)
Total Bilirubin: 0.4 mg/dL (ref <1.2)
Total Protein: 6.9 g/dL (ref 6.5–8.1)

## 2023-04-11 LAB — CBC WITH DIFFERENTIAL (CANCER CENTER ONLY)
Abs Immature Granulocytes: 0.19 10*3/uL — ABNORMAL HIGH (ref 0.00–0.07)
Basophils Absolute: 0.1 10*3/uL (ref 0.0–0.1)
Basophils Relative: 0 %
Eosinophils Absolute: 0.2 10*3/uL (ref 0.0–0.5)
Eosinophils Relative: 2 %
HCT: 35.3 % — ABNORMAL LOW (ref 36.0–46.0)
Hemoglobin: 10.6 g/dL — ABNORMAL LOW (ref 12.0–15.0)
Immature Granulocytes: 1 %
Lymphocytes Relative: 15 %
Lymphs Abs: 2 10*3/uL (ref 0.7–4.0)
MCH: 24.5 pg — ABNORMAL LOW (ref 26.0–34.0)
MCHC: 30 g/dL (ref 30.0–36.0)
MCV: 81.5 fL (ref 80.0–100.0)
Monocytes Absolute: 2.2 10*3/uL — ABNORMAL HIGH (ref 0.1–1.0)
Monocytes Relative: 15 %
Neutro Abs: 9.4 10*3/uL — ABNORMAL HIGH (ref 1.7–7.7)
Neutrophils Relative %: 67 %
Platelet Count: 515 10*3/uL — ABNORMAL HIGH (ref 150–400)
RBC: 4.33 MIL/uL (ref 3.87–5.11)
RDW: 21.6 % — ABNORMAL HIGH (ref 11.5–15.5)
WBC Count: 14 10*3/uL — ABNORMAL HIGH (ref 4.0–10.5)
nRBC: 0 % (ref 0.0–0.2)

## 2023-04-11 LAB — IRON AND IRON BINDING CAPACITY (CC-WL,HP ONLY)
Iron: 19 ug/dL — ABNORMAL LOW (ref 28–170)
Saturation Ratios: 12 % (ref 10.4–31.8)
TIBC: 154 ug/dL — ABNORMAL LOW (ref 250–450)
UIBC: 135 ug/dL — ABNORMAL LOW (ref 148–442)

## 2023-04-11 LAB — SAMPLE TO BLOOD BANK

## 2023-04-11 MED ORDER — LENVATINIB (20 MG DAILY DOSE) 2 X 10 MG PO CPPK: 20.0000 mg | ORAL_CAPSULE | Freq: Every day | ORAL | 11 refills | Status: DC

## 2023-04-11 NOTE — Assessment & Plan Note (Signed)
IV Venofer x 2 more

## 2023-04-11 NOTE — Telephone Encounter (Signed)
Oral Oncology Patient Advocate Encounter  Was successful in securing patient a $10,000 grant from [facility] to provide copayment coverage for lenvima.  This will keep the out of pocket expense at $0.     [employer] ID: [id number]  I have spoken with the patient.   The billing information is as follows and has been shared with [facility].    RxBin: [id number] PCN: [id number] Member ID: [id number] Group ID: [id number] Dates of Eligibility: 03/12/23 through 03/10/24  Fund:  Renal Cell  Jinger Neighbors, CPhT-Adv Oncology Pharmacy Patient Advocate [facility] Direct Number: [phone]  Fax: [phone]

## 2023-04-11 NOTE — Telephone Encounter (Signed)
Oral Oncology Patient Advocate Encounter  Prior Authorization for Diane Paul has been approved.    PA# [id number] Effective dates: 04/11/23 through 05/07/23  Patients co-pay is $3,289.91.    Jinger Neighbors, CPhT-Adv Oncology Pharmacy Patient Advocate [facility] Direct Number: [phone]  Fax: [phone]

## 2023-04-11 NOTE — Patient Instructions (Signed)
We discussed the diagnosis of kidney cancer, with metastasis to the lung today.  Even though treatment is not curable, long-term response has been reported on several clinical trials.  I recommend starting pembrolizumab with lenvatinib.  Lenvatinib is a pill you take daily at home.  Pembrolizumab is IV immunotherapy he received from infusion room every 3 weeks.  You will receive phone calls from our pharmacist regarding the pill.  Side effects of lenvatinib may include fatigue, diarrhea, arthralgia, myalgia, decreased appetite, nausea, vomiting, hypothyroidism, hypertension, mouth sores, rash, weight loss, dysphonia, proteinuria, hand foot syndrome, abdominal pain, bleeding, constipation, liver toxicity, headaches, kidney injury, blood clots, difficulty with wound healing, and rare osteonecrosis of the jaw, encephalopathy, heart failure, arrhythmia have been reported  Side effects of immunotherapy are mostly related to immune related reaction.  They depend on which organ may be affected.  Patient can have hyper or hypothyroidism, skin rash, fever, pneumonitis, hepatitis, carditis, colitis, nephritis or other endorgan damage from immune related reaction.  Severe fatal reaction such as carditis or encephalitis has been reported. Rarely severe side effects can result in death.  After discussion Kamyrn understands and would like to proceed.   At the beginning, we will see you about 1 to 2 weeks after starting treatment to make sure side effect is tolerable.  If you tolerated well, and responding well, we will see you every 3 weeks before pembrolizumab treatment.

## 2023-04-11 NOTE — Progress Notes (Signed)
START OFF PATHWAY REGIMEN - Renal Cell   [id number]:Lenvatinib 20 mg PO Daily D1-42 + Pembrolizumab 400 mg IV D1 q42 Days:   A cycle is every 42 days:     Lenvatinib      Pembrolizumab   **Always confirm dose/schedule in your pharmacy ordering system**  Patient Characteristics: Stage IV (Unresected T4M0 or Any T, M1)/Metastatic Disease, Clear Cell, First Line, Intermediate or Poor Risk Therapeutic Status: Stage IV (Unresected T4M0 or Any T, M1)/Metastatic Disease Histology: Clear Cell Line of Therapy: First Line Risk Status: Poor Risk Intent of Therapy: Non-Curative / Palliative Intent, Discussed with Patient

## 2023-04-11 NOTE — Telephone Encounter (Signed)
Dr. Cherly Hensen, patient will be scheduled as soon as possible.  Auth Submission: NO AUTH NEEDED Site of care: Site of care: [facility] Payer: [employer] medicare Medication & CPT/J Code(s) submitted: Venofer (Iron Sucrose) J1756 Route of submission (phone, fax, portal):  Phone # Fax # Auth type: Buy/Bill PB Units/visits requested: 200mg  x 2 doses Reference number:  Approval from: 04/11/23 to 05/07/23

## 2023-04-11 NOTE — Telephone Encounter (Signed)
Oral Oncology Pharmacist Encounter  Received new prescription for Lenvima (lenvatinib) for the treatment of stage IV clear cell renal cell carcinoma in conjunction with pembrolizumab, planned duration until disease progression or unacceptable toxicity.  Labs from 04/11/23 (CBC, CMP) assessed, no interventions needed. Patient will need additional labs prior to starting. Prescription dose and frequency assessed for appropriateness.   Current medication list in Epic reviewed, no significant/ relevant DDIs with Lenvima identified.   Evaluated chart and no patient barriers to medication adherence noted.   Patient agreement for treatment documented in MD note on 04/11/2023.  Prescription has been e-scribed to the [facility] for benefits analysis and approval.  Oral Oncology Clinic will continue to follow for insurance authorization, copayment issues, initial counseling and start date.  Bethel Born, PharmD Hematology/Oncology Clinical Pharmacist [facility] [phone] 04/11/2023 1:23 PM

## 2023-04-11 NOTE — Telephone Encounter (Signed)
Oral Oncology Patient Advocate Encounter   Received notification that prior authorization for Lenvima is required.   PA submitted on 04/11/23 Key [id number] Status is pending     Jinger Neighbors, CPhT-Adv Oncology Pharmacy Patient Advocate [facility] Direct Number: [phone]  Fax: [phone]

## 2023-04-11 NOTE — Assessment & Plan Note (Signed)
Drug monitoring for lenvatinib. Monitor for hypertension Baseline EKG LFT, renal function, electrolytes, every 2 weeks for 2 months and then monthly thereafter Baseline TSH and free T4 and then monthly urine dipstick; if 2+ then obtain a 24-hour urine protein Dental exam before and periodically Monitor for signs of bleeding, wound healing, thrombosis Follow up one week after starting lenvatinib.

## 2023-04-12 ENCOUNTER — Other Ambulatory Visit: Payer: Self-pay

## 2023-04-16 ENCOUNTER — Encounter: Payer: Self-pay | Admitting: *Deleted

## 2023-04-16 ENCOUNTER — Other Ambulatory Visit: Payer: Self-pay | Admitting: *Deleted

## 2023-04-16 NOTE — Patient Outreach (Signed)
Care Management  Transitions of Care Program Transitions of Care Post-discharge week 2   04/16/2023 Name: Diane Paul MRN: [id number] DOB: November 28, 1940  Subjective: Diane Paul is a 82 y.o. year old female who is a primary care patient of Pickard, Priscille Heidelberg, MD. The Care Management team Engaged with patient Engaged with patient by telephone to assess and address transitions of care needs.   Consent to Services:  Patient was given information about care management services, agreed to services, and gave verbal consent to participate.   Assessment: Daughter continues to provide oversight and assists pt as needed, pt feels she is doing better, getting stronger, followed up with oncologist and will begin treatment for cancer this month, has all medications and taking as prescribed.         SDOH Interventions    Flowsheet Row Telephone from 04/08/2023 in [facility] Clinical Support from 03/25/2023 in [facility] - A Dept Of [place] Clinical Support from 07/25/2022 in [facility] Clinical Support from 07/07/2021 in [facility]  SDOH Interventions      Food Insecurity Interventions Intervention Not Indicated -- Intervention Not Indicated Intervention Not Indicated  Housing Interventions Intervention Not Indicated Intervention Not Indicated Intervention Not Indicated Intervention Not Indicated  Transportation Interventions Intervention Not Indicated -- Intervention Not Indicated Intervention Not Indicated  Utilities Interventions Intervention Not Indicated -- Intervention Not Indicated --  Alcohol Usage Interventions -- -- Intervention Not Indicated (Score <7) --  Financial Strain Interventions -- Intervention Not Indicated Intervention Not Indicated Intervention Not Indicated  Physical Activity Interventions -- -- Intervention Not Indicated Other (Comments)  [Walks to mailbox and  works in garden. Encouraged pt to walk more at tolerated.]  Stress Interventions -- -- Intervention Not Indicated Intervention Not Indicated  Social Connections Interventions -- -- Intervention Not Indicated Intervention Not Indicated        Goals Addressed             This Visit's Progress    Transitions of Care/ pt will have no readmissions within 30 days       Current Barriers:  Knowledge Deficits related to plan of care for management of post op right lap radical nephrectomy  Spoke with patient and permission give to speak with adult daughter Diane Paul who reports pt is doing well, has all medications and taking as prescribed, has all specialist follow up appointments scheduled, pain is controlled well, has not needed to take tramadol, no signs/ symptoms of infection reported Patient followed up with oncologist and will begin treatments this month Patient reports feeling stronger  RNCM Clinical Goal(s):  Patient will verbalize understanding of plan for management of post op right lap radical nephrectomy as evidenced by patient report, review of EMR take all medications exactly as prescribed and will call provider for medication related questions as evidenced by patient report, review of EMR attend all scheduled medical appointments: urology and CT lung biopsy as evidenced by patient report, review of EMR and  through collaboration with RN Care manager, provider, and care team.   Interventions: Evaluation of current treatment plan related to  self management and patient's adherence to plan as established by provider   Post op right lap radical nephrectomy  (Status:  New goal. and Goal on track:  Yes.)  Short Term Goal Evaluation of current treatment plan related to  post op surgery , ADL IADL limitations self-management and  patient's adherence to plan as established by provider. Reviewed all upcoming scheduled appointments Reinforced safety precautions Reviewed signs/  symptoms of infection and prevention  Patient Goals/Self-Care Activities: Participate in Transition of Care Program/Attend TOC scheduled calls Notify RN Care Manager of TOC call rescheduling needs Take all medications as prescribed Attend all scheduled provider appointments Call pharmacy for medication refills 3-7 days in advance of running out of medications Call provider office for new concerns or questions  Practice good handwashing, avoid sick people, wear a mask as needed  Follow Up Plan:  Telephone follow up appointment with care management team member scheduled for:  04/24/23 at 1 pm          Plan: Telephone follow up appointment with care management team member scheduled for:  04/24/23 at 1 pm  Irving Shows [facility], BSN RN Care Manager/ Transition of Care [place]/ [facility] [phone]

## 2023-04-16 NOTE — Progress Notes (Signed)
FMLA Forms completed for Diane Paul Zachery Dauer as requested. Fax transmission confirmation received. Copy of forms emailed to Diane Paul as requested by Patient. No other needs or concerns noted at this time.

## 2023-04-16 NOTE — Patient Instructions (Signed)
Visit Information  Thank you for taking time to visit with me today. Please don't hesitate to contact me if I can be of assistance to you before our next scheduled telephone appointment.  Following are the goals we discussed today:   Goals Addressed             This Visit's Progress    Transitions of Care/ pt will have no readmissions within 30 days       Current Barriers:  Knowledge Deficits related to plan of care for management of post op right lap radical nephrectomy  Spoke with patient and permission give to speak with adult daughter Nelson Westergaard who reports pt is doing well, has all medications and taking as prescribed, has all specialist follow up appointments scheduled, pain is controlled well, has not needed to take tramadol, no signs/ symptoms of infection reported Patient followed up with oncologist and will begin treatments this month Patient reports feeling stronger  RNCM Clinical Goal(s):  Patient will verbalize understanding of plan for management of post op right lap radical nephrectomy as evidenced by patient report, review of EMR take all medications exactly as prescribed and will call provider for medication related questions as evidenced by patient report, review of EMR attend all scheduled medical appointments: urology and CT lung biopsy as evidenced by patient report, review of EMR and  through collaboration with RN Care manager, provider, and care team.   Interventions: Evaluation of current treatment plan related to  self management and patient's adherence to plan as established by provider   Post op right lap radical nephrectomy  (Status:  New goal. and Goal on track:  Yes.)  Short Term Goal Evaluation of current treatment plan related to  post op surgery , ADL IADL limitations self-management and patient's adherence to plan as established by provider. Reviewed all upcoming scheduled appointments Reinforced safety precautions Reviewed signs/ symptoms of  infection and prevention  Patient Goals/Self-Care Activities: Participate in Transition of Care Program/Attend TOC scheduled calls Notify RN Care Manager of TOC call rescheduling needs Take all medications as prescribed Attend all scheduled provider appointments Call pharmacy for medication refills 3-7 days in advance of running out of medications Call provider office for new concerns or questions  Practice good handwashing, avoid sick people, wear a mask as needed  Follow Up Plan:  Telephone follow up appointment with care management team member scheduled for:  04/24/23 at 1 pm           Our next appointment is by telephone on 04/24/23 at 1 pm  Please call the care guide team at [phone] if you need to cancel or reschedule your appointment.   If you are experiencing a Mental Health or Behavioral Health Crisis or need someone to talk to, please call the Suicide and Crisis Lifeline: 988 call the [place] National Suicide Prevention Lifeline: 5712420950 or TTY: 610-491-1434 TTY 240-869-0209) to talk to a trained counselor call 1-800-273-TALK (toll free, 24 hour hotline) go to [facility] [address] [place] [phone]) call the [facility] Line: [phone] call 911   The patient verbalized understanding of instructions, educational materials, and care plan provided today and DECLINED offer to receive copy of patient instructions, educational materials, and care plan.   Irving [facility], BSN RN Care Manager/ Transition of Care [place]/ [facility] [phone]

## 2023-04-17 ENCOUNTER — Ambulatory Visit: Payer: Self-pay | Admitting: Family Medicine

## 2023-04-17 ENCOUNTER — Telehealth: Payer: Self-pay

## 2023-04-17 NOTE — Telephone Encounter (Signed)
TC from pt's daughter to report that pt continues to have abdominal pain s/p nephrectomy on 04/03/23. She is also having "soreness" and some swelling near her incision site. Afebrile. Advised to contact MD who performed surgery. Daughter states she will call [facility].

## 2023-04-17 NOTE — Progress Notes (Signed)

## 2023-04-17 NOTE — Telephone Encounter (Addendum)
Info collected from daughter with permission from pt Chief Complaint: swelling in RUQ, recent kidney removal Symptoms: small pain during sleep last night, subsided. Swelling.  Frequency: this morning Pertinent Negatives: Patient denies fever, denies drainage, denies pain uncontrolled with tylenol, denies bleeding.  Disposition: [] ED /[] Urgent Care (no appt availability in office) / [] Appointment(In office/virtual)/ []  [facility]/ [] Home Care/ [] Refused Recommended Disposition /[] [facility]/ [x]  Follow-up with Surgeon  Additional Notes: Pt recent kidney removal, 11/27. Pt has had difficulty with constipation but has resolved this issue daughter believes. Daughter states last night while sleeping pt had a pain in her back, woke this morning with swelling to RUQ. Pt states pain has subsided. Pt alert and still ambulating per her usual. Daughter denies drainage. Offered to schedule with clinic NP, but advised to call surgeon as well. Pt daughter confirms they will call surgeon first and call back to PCP should she want an appt with [place] at that time.   Copied from CRM [id number]. Topic: Clinical - Red Word Triage >> Apr 17, 2023  9:46 AM Louie Boston wrote: Red Word that prompted transfer to Nurse Triage: Swelling Reason for Disposition  [1] Caller has URGENT question AND [2] triager unable to answer question  Answer Assessment - Initial Assessment Questions 1. SYMPTOM: "What's the main symptom you're concerned about?" (e.g., drainage, incision opening up, pain, redness)     Swelling near incision and abd area.  2. ONSET: "When did swelling  start?"     This morning 3. SURGERY: "What surgery did you have?"     Kidney removed 4. DATE of SURGERY: "When was the surgery?"      11/27 5. INCISION SITE: "Where is the incision located?"      R side below breast 6. REDNESS: "Is there any redness at the incision site?" If Yes, ask: "How wide across is the redness?" (Inches, centimeters)       denies 7. PAIN: "Is there any pain?" If Yes, ask: "How bad is it?"  (Scale 1-10; or mild, moderate, severe)   - NONE (0): no pain   - MILD (1-3): doesn't interfere with normal activities    - MODERATE (4-7): interferes with normal activities or awakens from sleep    - SEVERE (8-10): excruciating pain, unable to do any normal activities     denies 8. BLEEDING: "Is there any bleeding?" If Yes, ask: "How much?" and "Where?"     denies 9. DRAINAGE: "Is there any drainage from the incision site?" If Yes, ask: "What color and how much?" (e.g., red, cloudy, pus; drops, teaspoon)     denies 10. FEVER: "Do you have a fever?" If Yes, ask: "What is your temperature, how was it measured, and when did it start?"       denies 11. OTHER SYMPTOMS: "Do you have any other symptoms?" (e.g., dizziness, rash elsewhere on body, shaking chills, weakness)       Constipation?-she feels this is resolving with the fiber  Protocols used: Post-Op Incision Symptoms and Questions-A-AH

## 2023-04-18 ENCOUNTER — Telehealth: Payer: Self-pay

## 2023-04-18 ENCOUNTER — Emergency Department (HOSPITAL_COMMUNITY)
Admission: EM | Admit: 2023-04-18 | Discharge: 2023-04-19 | Disposition: A | Discharge status: Home / Self Care | Payer: Medicare HMO | Attending: Emergency Medicine | Admitting: Emergency Medicine

## 2023-04-18 ENCOUNTER — Emergency Department (HOSPITAL_COMMUNITY): Payer: Medicare HMO

## 2023-04-18 ENCOUNTER — Other Ambulatory Visit: Payer: Self-pay

## 2023-04-18 ENCOUNTER — Encounter (HOSPITAL_COMMUNITY): Payer: Self-pay

## 2023-04-18 ENCOUNTER — Inpatient Hospital Stay: Payer: Medicare HMO

## 2023-04-18 DIAGNOSIS — R109 Unspecified abdominal pain: Secondary | ICD-10-CM | POA: Diagnosis not present

## 2023-04-18 DIAGNOSIS — J90 Pleural effusion, not elsewhere classified: Secondary | ICD-10-CM | POA: Diagnosis not present

## 2023-04-18 DIAGNOSIS — R82998 Other abnormal findings in urine: Secondary | ICD-10-CM | POA: Diagnosis not present

## 2023-04-18 DIAGNOSIS — X58XXXA Exposure to other specified factors, initial encounter: Secondary | ICD-10-CM | POA: Diagnosis not present

## 2023-04-18 DIAGNOSIS — C3490 Malignant neoplasm of unspecified part of unspecified bronchus or lung: Secondary | ICD-10-CM | POA: Diagnosis not present

## 2023-04-18 DIAGNOSIS — R252 Cramp and spasm: Secondary | ICD-10-CM | POA: Diagnosis not present

## 2023-04-18 DIAGNOSIS — S29001A Unspecified injury of muscle and tendon of front wall of thorax, initial encounter: Secondary | ICD-10-CM | POA: Diagnosis not present

## 2023-04-18 DIAGNOSIS — C7800 Secondary malignant neoplasm of unspecified lung: Secondary | ICD-10-CM | POA: Diagnosis not present

## 2023-04-18 DIAGNOSIS — R0902 Hypoxemia: Secondary | ICD-10-CM | POA: Diagnosis not present

## 2023-04-18 DIAGNOSIS — R059 Cough, unspecified: Secondary | ICD-10-CM | POA: Diagnosis not present

## 2023-04-18 DIAGNOSIS — C7801 Secondary malignant neoplasm of right lung: Secondary | ICD-10-CM | POA: Insufficient documentation

## 2023-04-18 DIAGNOSIS — C801 Malignant (primary) neoplasm, unspecified: Secondary | ICD-10-CM | POA: Diagnosis not present

## 2023-04-18 DIAGNOSIS — C7802 Secondary malignant neoplasm of left lung: Secondary | ICD-10-CM | POA: Diagnosis not present

## 2023-04-18 DIAGNOSIS — I10 Essential (primary) hypertension: Secondary | ICD-10-CM | POA: Diagnosis not present

## 2023-04-18 DIAGNOSIS — S2231XA Fracture of one rib, right side, initial encounter for closed fracture: Secondary | ICD-10-CM | POA: Insufficient documentation

## 2023-04-18 DIAGNOSIS — Z743 Need for continuous supervision: Secondary | ICD-10-CM | POA: Diagnosis not present

## 2023-04-18 DIAGNOSIS — R1084 Generalized abdominal pain: Secondary | ICD-10-CM | POA: Diagnosis not present

## 2023-04-18 LAB — CBC WITH DIFFERENTIAL/PLATELET
Abs Immature Granulocytes: 0.04 10*3/uL (ref 0.00–0.07)
Basophils Absolute: 0 10*3/uL (ref 0.0–0.1)
Basophils Relative: 0 %
Eosinophils Absolute: 0.1 10*3/uL (ref 0.0–0.5)
Eosinophils Relative: 2 %
HCT: 64.1 % — ABNORMAL HIGH (ref 36.0–46.0)
Hemoglobin: 19.3 g/dL — ABNORMAL HIGH (ref 12.0–15.0)
Immature Granulocytes: 1 %
Lymphocytes Relative: 10 %
Lymphs Abs: 0.5 10*3/uL — ABNORMAL LOW (ref 0.7–4.0)
MCH: 24.3 pg — ABNORMAL LOW (ref 26.0–34.0)
MCHC: 30.1 g/dL (ref 30.0–36.0)
MCV: 80.6 fL (ref 80.0–100.0)
Monocytes Absolute: 0.6 10*3/uL (ref 0.1–1.0)
Monocytes Relative: 12 %
Neutro Abs: 3.6 10*3/uL (ref 1.7–7.7)
Neutrophils Relative %: 75 %
Platelets: 166 10*3/uL (ref 150–400)
RBC: 7.95 MIL/uL — ABNORMAL HIGH (ref 3.87–5.11)
RDW: 22.7 % — ABNORMAL HIGH (ref 11.5–15.5)
WBC: 4.9 10*3/uL (ref 4.0–10.5)
nRBC: 0 % (ref 0.0–0.2)

## 2023-04-18 LAB — COMPREHENSIVE METABOLIC PANEL
ALT: 9 U/L (ref 0–44)
AST: 14 U/L — ABNORMAL LOW (ref 15–41)
Albumin: 2.2 g/dL — ABNORMAL LOW (ref 3.5–5.0)
Alkaline Phosphatase: 76 U/L (ref 38–126)
Anion gap: 11 (ref 5–15)
BUN: 19 mg/dL (ref 8–23)
CO2: 25 mmol/L (ref 22–32)
Calcium: 9 mg/dL (ref 8.9–10.3)
Chloride: 99 mmol/L (ref 98–111)
Creatinine, Ser: 1.28 mg/dL — ABNORMAL HIGH (ref 0.44–1.00)
GFR, Estimated: 42 mL/min — ABNORMAL LOW (ref >60)
Glucose, Bld: 110 mg/dL — ABNORMAL HIGH (ref 70–99)
Potassium: 5 mmol/L (ref 3.5–5.1)
Sodium: 135 mmol/L (ref 135–145)
Total Bilirubin: 0.2 mg/dL (ref <1.2)
Total Protein: 7.1 g/dL (ref 6.5–8.1)

## 2023-04-18 LAB — URINALYSIS, W/ REFLEX TO CULTURE (INFECTION SUSPECTED)
Bilirubin Urine: NEGATIVE
Glucose, UA: NEGATIVE mg/dL
Hgb urine dipstick: NEGATIVE
Ketones, ur: NEGATIVE mg/dL
Nitrite: NEGATIVE
Protein, ur: NEGATIVE mg/dL
Specific Gravity, Urine: 1.006 (ref 1.005–1.030)
pH: 6 (ref 5.0–8.0)

## 2023-04-18 LAB — MAGNESIUM: Magnesium: 2.3 mg/dL (ref 1.7–2.4)

## 2023-04-18 LAB — LIPASE, BLOOD: Lipase: 29 U/L (ref 11–51)

## 2023-04-18 LAB — D-DIMER, QUANTITATIVE: D-Dimer, Quant: 3.08 ug{FEU}/mL — ABNORMAL HIGH (ref 0.00–0.50)

## 2023-04-18 MED ORDER — SODIUM CHLORIDE 0.9 % IV SOLN: Freq: Once | INTRAVENOUS | Status: DC

## 2023-04-18 MED ORDER — ONDANSETRON HCL 8 MG PO TABS: 8.0000 mg | ORAL_TABLET | Freq: Three times a day (TID) | ORAL | 1 refills | Status: DC | PRN

## 2023-04-18 MED ORDER — FENTANYL CITRATE PF 50 MCG/ML IJ SOSY
50.0000 ug | PREFILLED_SYRINGE | Freq: Once | INTRAMUSCULAR | Status: AC
  Administered 2023-04-18: 50 ug via INTRAVENOUS
  Filled 2023-04-18: qty 1

## 2023-04-18 MED ORDER — PROCHLORPERAZINE MALEATE 10 MG PO TABS: 10.0000 mg | ORAL_TABLET | Freq: Four times a day (QID) | ORAL | 1 refills | Status: DC | PRN

## 2023-04-18 MED ORDER — SODIUM CHLORIDE 0.9 % IV SOLN
INTRAVENOUS | Status: AC
Start: 2023-04-18 — End: 2023-04-19

## 2023-04-18 MED ORDER — HYDROMORPHONE HCL 1 MG/ML IJ SOLN
0.5000 mg | Freq: Once | INTRAMUSCULAR | Status: AC
  Administered 2023-04-18: 0.5 mg via INTRAVENOUS
  Filled 2023-04-18: qty 1

## 2023-04-18 MED ORDER — IOHEXOL 350 MG/ML SOLN
60.0000 mL | Freq: Once | INTRAVENOUS | Status: AC | PRN
  Administered 2023-04-18: 60 mL via INTRAVENOUS

## 2023-04-18 NOTE — ED Triage Notes (Signed)
Patient had her right kidney removed and patient is complaining of muscle spasms around surgical site.  Patient has renal cell carcinoma.

## 2023-04-18 NOTE — Telephone Encounter (Signed)
Ms Walp called to reschedule her mother's education appointment for today. Her mother experiencing some severe muscle spasms  and is on her way to the ED by ambulance. Rescheduled appointment for Tuesday 04-23-23 at 4 pm.

## 2023-04-18 NOTE — ED Provider Notes (Signed)
[facility] EMERGENCY DEPARTMENT AT [facility] Provider Note   CSN: [id number] Arrival date & time: 04/18/23  1341     History  Chief Complaint  Patient presents with   Post-op Problem    Diane Paul is a 82 y.o. female.  HPI 82 year old female presents with right flank pain/back pain.  Started yesterday with a small amount of pain that was attributed to a possible spasm.  Today the pain became worse.  It is tender to palpation and also hurts with movement or coughing.  She has had a cough for couple weeks along with some sinus drainage.  However this pain seemed to come out of nowhere.  Certain movements makes it worse.  No specific pleuritic symptoms.  No urinary symptoms or vomiting or fever.  Has tried Tylenol and mustard with some partial relief. No weakness or numbness in her legs.   On 11/27 she had a right-sided laparoscopic nephrectomy by Dr. Berneice Heinrich.  Home Medications Prior to Admission medications   Medication Sig Start Date End Date Taking? Authorizing Provider  amLODipine (NORVASC) 5 MG tablet Take 5 mg by mouth daily. Patient not taking: Reported on 04/08/2023    [provider]  amoxicillin (AMOXIL) 875 MG tablet Take 875 mg by mouth 2 (two) times daily. Patient not taking: Reported on 04/08/2023    [provider]  Ensure (ENSURE) Take 237 mLs by mouth 2 (two) times daily between meals.    [provider]  Iron, Ferrous Sulfate, 325 (65 Fe) MG TABS Take 325 mg by mouth daily. Patient not taking: Reported on 04/11/2023 01/14/23   Donita Brooks, MD  lenvatinib 20 mg daily dose (LENVIMA) 2 x 10 MG capsule Take 2 capsules (20 mg total) by mouth daily. Start 1 (10 mg) daily for the first cycle 04/11/23   [name], MD  ondansetron (ZOFRAN) 8 MG tablet Take 1 tablet (8 mg total) by mouth every 8 (eight) hours as needed for nausea or vomiting. 04/18/23   [name], MD  pantoprazole (PROTONIX) 40 MG tablet Take 1 tablet (40  mg total) by mouth 2 (two) times daily. Patient not taking: Reported on 03/26/2023 02/06/23 03/08/23  Lanae Boast, MD  prochlorperazine (COMPAZINE) 10 MG tablet Take 1 tablet (10 mg total) by mouth every 6 (six) hours as needed for nausea or vomiting. 04/18/23   [name], MD  senna-docusate (SENOKOT-S) 8.6-50 MG tablet Take 1 tablet by mouth 2 (two) times daily. While taking strong pain meds to prevent constipation 04/03/23   Berneice Heinrich Delbert Phenix., MD  traMADol (ULTRAM) 50 MG tablet Take 1 tablet (50 mg total) by mouth every 6 (six) hours as needed. Patient not taking: Reported on 04/11/2023 04/07/23 04/06/24  Noel Christmas, MD      Allergies    Fosamax [alendronate sodium]    Review of Systems   Review of Systems  Respiratory:  Positive for cough.   Cardiovascular:  Negative for chest pain.  Gastrointestinal:  Negative for abdominal pain.  Genitourinary:  Negative for dysuria.  Musculoskeletal:  Positive for back pain.  Neurological:  Negative for weakness and numbness.    Physical Exam Updated Vital Signs BP 113/71   Pulse 95   Temp 97.9 F (36.6 C) (Oral)   Resp 20   Ht 5\' 7"  (1.702 m)   Wt 81.6 kg   SpO2 100%   BMI 28.19 kg/m  Physical Exam Vitals and nursing note reviewed.  Constitutional:  Appearance: She is well-developed.  HENT:     Head: Normocephalic and atraumatic.  Cardiovascular:     Rate and Rhythm: Normal rate and regular rhythm.     Heart sounds: Normal heart sounds.  Pulmonary:     Effort: Pulmonary effort is normal.     Breath sounds: Normal breath sounds.  Abdominal:     Palpations: Abdomen is soft.     Tenderness: There is no abdominal tenderness.     Comments: Surgical incisions are C/D/I  Musculoskeletal:       Arms:  Skin:    General: Skin is warm and dry.  Neurological:     Mental Status: She is alert.     ED Results / Procedures / Treatments   Labs (all labs ordered are listed, but only abnormal results are  displayed) Labs Reviewed  COMPREHENSIVE METABOLIC PANEL  CBC WITH DIFFERENTIAL/PLATELET  LIPASE, BLOOD  URINALYSIS, W/ REFLEX TO CULTURE (INFECTION SUSPECTED)  D-DIMER, QUANTITATIVE    EKG None  Radiology No results found.  Procedures Procedures    Medications Ordered in ED Medications  fentaNYL (SUBLIMAZE) injection 50 mcg (has no administration in time range)    ED Course/ Medical Decision Making/ A&P                                 Medical Decision Making Amount and/or Complexity of Data Reviewed Labs: ordered. Radiology: ordered and independent interpretation performed.    Details: No pneumothorax. ECG/medicine tests: ordered and independent interpretation performed.    Details: No ischemia  Risk Prescription drug management.   Patient was given IV fentanyl for pain. Will need imaging, but given location will get labs, including ddimer first. Care transferred to Dr. Donnald Garre.        Final Clinical Impression(s) / ED Diagnoses Final diagnoses:  None    Rx / DC Orders ED Discharge Orders     None         Pricilla Loveless, MD 04/18/23 [phone]

## 2023-04-18 NOTE — ED Notes (Signed)
Patient back from CT; on cardiac monitor; resting comfortably; pain free currently; family at the bedside; no neuro changes; updated patient & family.

## 2023-04-18 NOTE — Addendum Note (Signed)
Addended by: Geanie Berlin on: 04/18/2023 08:58 AM   Modules accepted: Orders

## 2023-04-18 NOTE — ED Provider Notes (Signed)
Flank pain. D dimer to decide if CT chest and abdo or just Abdo. Physical Exam  BP 113/71   Pulse 95   Temp 97.9 F (36.6 C) (Oral)   Resp 20   Ht 5\' 7"  (1.702 m)   Wt 81.6 kg   SpO2 100%   BMI 28.19 kg/m   Physical Exam  Procedures  Procedures  ED Course / MDM    Medical Decision Making Amount and/or Complexity of Data Reviewed Labs: ordered. Radiology: ordered.  Risk Prescription drug management.   18:42 D-dimer still pending. Patient has significant risk factors for PE, will proceed with imagining to include PE study and abdomen for post operative pain.  Patient reports she did not get much relief with fentanyl.  I will give half a milligram Dilaudid.  Patient and family updated on plan to proceed with CT chest and abdomen.  Patient is alert.  She has no respiratory distress.  Vital signs are stable.  There was delay in obtaining CT for need for ultrasound IV.  CT scan shows extensive metastatic disease to the lungs which has increased since last study.  Radiology identifies a nondisplaced new rib fracture on the right.  There is questionable surrounding lucency to suggest possible pathological fracture.  This corresponds to the patient's pain.  The patient had apparently been doing well and getting some pain with certain movements and then suddenly having a much worse focal pain.  CT does not suggest abscess collection.  Patient's vital signs are stable with normal blood pressures and no leukocytosis.  Do not suspect postoperative infection.  Symptoms started quite quickly.   At this time patient's only pain control at home has been with Tylenol.  Plan will be due prescribe 1-2 Percocet every 6 hours and close follow-up with outpatient providers to further address the extent of increasing lymphadenopathy and metastatic disease to the lungs.       Arby Barrette, MD 04/19/23 [phone]

## 2023-04-19 ENCOUNTER — Ambulatory Visit: Payer: Medicare HMO

## 2023-04-19 ENCOUNTER — Telehealth: Payer: Self-pay

## 2023-04-19 ENCOUNTER — Other Ambulatory Visit: Payer: Self-pay

## 2023-04-19 MED ORDER — DIPHENHYDRAMINE HCL 25 MG PO CAPS: 25.0000 mg | ORAL_CAPSULE | Freq: Once | ORAL | Status: DC

## 2023-04-19 MED ORDER — OXYCODONE-ACETAMINOPHEN 5-325 MG PO TABS: 1.0000 | ORAL_TABLET | Freq: Four times a day (QID) | ORAL | 0 refills | Status: DC | PRN

## 2023-04-19 MED ORDER — IRON SUCROSE 20 MG/ML IV SOLN: 200.0000 mg | Freq: Once | INTRAVENOUS | Status: DC

## 2023-04-19 MED ORDER — HYDROMORPHONE HCL 1 MG/ML IJ SOLN
1.0000 mg | Freq: Once | INTRAMUSCULAR | Status: AC
  Administered 2023-04-19: 1 mg via INTRAVENOUS
  Filled 2023-04-19: qty 1

## 2023-04-19 MED ORDER — ACETAMINOPHEN 325 MG PO TABS: 650.0000 mg | ORAL_TABLET | Freq: Once | ORAL | Status: DC

## 2023-04-19 NOTE — Telephone Encounter (Signed)
Received the following staff message by Dr. Cherly Hensen:  Angelica Chessman please let her daughter know I reviewed the CT from yesterday for comparison in the future after starting treatment.  She does not need to do another CT, but just MRI of brain at any date.   The initial plan from my note was to start both medicine together. She should still proceed with immunotherapy Keytruda as scheduled next week on 12/19.   Hold on starting oral lenvatinib and do not start on 12/19. Let her heal from the surgery for one additional week and start lenvatinib on 12/26. Thank you.    TC to pt's daughter to inform of the need for only an MRI of the brain and gave her the number for Central Scheduling to get this scheduled. Also informed daughter to have pt hold off on starting Lenvima until 12/26--to come in next week for Keytruda infusion on 12/19. She verbalizes understanding.

## 2023-04-19 NOTE — Telephone Encounter (Signed)
T/C from pt's daughter stating pt had to go to the ED yesterday for increased pain.  She was advised to let you know to review scan results.

## 2023-04-19 NOTE — Progress Notes (Signed)
Mandy please let her daughter know I reviewed the CT from yesterday for comparison in the future after starting treatment.  She does not need to do another CT, but just MRI of brain at any date.  The initial plan from my note was to start both medicine together. She should still proceed with immunotherapy Keytruda as scheduled next week on 12/19.  Hold on starting oral lenvatinib and do not start on 12/19. Let her heal from the surgery for one additional week and start lenvatinib on 12/26. Thank you.

## 2023-04-19 NOTE — ED Notes (Signed)
ED Provider at bedside. 

## 2023-04-19 NOTE — Discharge Instructions (Signed)
1.  You may take 1-2 Percocet every 6 hours for pain control. 2.  You have a broken rib on the right side.  This can happen with no injury if there is a weak area in the bone.  In your case, it is possible there is a metastatic bone tumor that weaken the bone and allowed for a rib fracture to occur with very little injury. 3.  You need to discuss your CT findings and ongoing treatment with your oncologist as soon as possible.  There has been an increase in the amount of tumor in the lungs since your last study.

## 2023-04-20 ENCOUNTER — Other Ambulatory Visit: Payer: Self-pay | Admitting: Urology

## 2023-04-20 MED ORDER — HYDROCODONE-ACETAMINOPHEN 5-325 MG PO TABS: 1.0000 | ORAL_TABLET | Freq: Four times a day (QID) | ORAL | 0 refills | Status: DC | PRN

## 2023-04-20 NOTE — Progress Notes (Signed)
I have spoken to Saroya's daughter.  Dezerea was in the ER on 04/18/23 and was found to have progressive metastatic disease and a right 8th rib fracture.  She was getting good pain relief with Oxycodone but had unacceptable itching.  She was using tramadol but with only partial relief.  I don't want to send dilaudid with her pulmonary issues and the narrow therapeutic window.   I have sent hydrocodone/APAP 5/325 #20 1 po q6hr prn.

## 2023-04-22 ENCOUNTER — Encounter: Payer: Self-pay | Admitting: Adult Health

## 2023-04-23 ENCOUNTER — Ambulatory Visit: Payer: Medicare HMO | Admitting: Family Medicine

## 2023-04-23 ENCOUNTER — Inpatient Hospital Stay: Payer: Medicare HMO

## 2023-04-23 ENCOUNTER — Other Ambulatory Visit: Payer: Self-pay

## 2023-04-23 VITALS — BP 110/68 | HR 72 | Temp 97.8°F | Ht 67.0 in | Wt 175.0 lb

## 2023-04-23 DIAGNOSIS — C3491 Malignant neoplasm of unspecified part of right bronchus or lung: Secondary | ICD-10-CM | POA: Diagnosis not present

## 2023-04-23 DIAGNOSIS — C641 Malignant neoplasm of right kidney, except renal pelvis: Secondary | ICD-10-CM

## 2023-04-23 DIAGNOSIS — D500 Iron deficiency anemia secondary to blood loss (chronic): Secondary | ICD-10-CM | POA: Diagnosis not present

## 2023-04-23 MED ORDER — PROCHLORPERAZINE MALEATE 10 MG PO TABS: 10.0000 mg | ORAL_TABLET | Freq: Four times a day (QID) | ORAL | 1 refills | Status: DC | PRN

## 2023-04-23 MED ORDER — ONDANSETRON HCL 8 MG PO TABS: 8.0000 mg | ORAL_TABLET | Freq: Three times a day (TID) | ORAL | 1 refills | Status: DC | PRN

## 2023-04-23 MED ORDER — HYDROCODONE-ACETAMINOPHEN 5-325 MG PO TABS: 1.0000 | ORAL_TABLET | Freq: Four times a day (QID) | ORAL | 0 refills | Status: DC | PRN

## 2023-04-23 MED ORDER — PROCHLORPERAZINE MALEATE 10 MG PO TABS: 10.0000 mg | ORAL_TABLET | Freq: Four times a day (QID) | ORAL | 1 refills | Status: AC | PRN

## 2023-04-23 MED ORDER — ONDANSETRON HCL 8 MG PO TABS: 8.0000 mg | ORAL_TABLET | Freq: Three times a day (TID) | ORAL | 1 refills | Status: AC | PRN

## 2023-04-23 NOTE — Progress Notes (Signed)
Subjective:    Patient ID: Diane Paul, female    DOB: Nov 02, 1940, 82 y.o.   MRN: [id number] Admitted 11/27-12/1 for right nephrectomy due to RCC.  Went to ER for flank pain on 12/12 and CT revealed:  CT scan shows extensive metastatic disease to the lungs which has increased since last study.  Radiology identifies a nondisplaced new rib fracture on the right.  There is questionable surrounding lucency to suggest possible pathological fracture.  This corresponds to the patient's pain.  The patient had apparently been doing well and getting some pain with certain movements and then suddenly having a much worse focal pain.  CT does not suggest abscess collection.  Patient's vital signs are stable with normal blood pressures and no leukocytosis.  Do not suspect postoperative infection.  Symptoms started quite quickly.    At this time patient's only pain control at home has been with Tylenol.  Plan will be due prescribe 1-2 Percocet every 6 hours and close follow-up with outpatient providers to further address the extent of increasing lymphadenopathy and metastatic disease to the lungs.  04/23/23 Patient is here today with her daughter and son-in-law for follow-up.  She does have pain in her right posterior flank where she has a rib fracture.  She denies any cough.  She denies any fevers or chills or hemoptysis.  Her lungs are clear today on examination.  She does have pain and is taking hydrocodone every 6 hours to help with pain.  This is causing constipation.  She is taking what sounds like Senokot.  Her daughter states that MiraLAX does not work for her.  However they only tried 1 dose.  I have suggested that they start taking MiraLAX 1-2 times daily to help prevent severe constipation in the future along with drinking plenty of water.  Otherwise she is doing well.  Her renal cell cancer with surgery however she has not started immunotherapy for lung cancer.  I explained them the CT scan showed  several pulmonary nodules that are likely due to the lung cancer.  They are aware.  They had not yet followed up with oncology but they have an appointment to start immunotherapy on 19 December Past Medical History:  Diagnosis Date   Anemia    Anxiety    nervous sometimes, denies panica ttacks    Arthritis    knees ?   Blood transfusion    post vaginal birth- 1960, 01/2023- anemia   Breast cancer (HCC)    Right Breast Cancer   Cancer (HCC)    R breast cancer   Dry mouth    GERD (gastroesophageal reflux disease)    Hypertension    Neuromuscular disorder (HCC)    L leg, thigh- "burns sometimes"   Shortness of breath    sometimes    Use of letrozole (Femara) 05/07/2010   neoadjuvant femara therapy since 1/212   Past Surgical History:  Procedure Laterality Date   ABDOMINAL HYSTERECTOMY     BIOPSY  02/03/2023   Procedure: BIOPSY;  Surgeon: Jeani Hawking, MD;  Location: [facility] ENDOSCOPY;  Service: Gastroenterology;;  gastric   BREAST LUMPECTOMY     BREAST SURGERY     Right   BRONCHIAL BIOPSY  02/12/2023   Procedure: BRONCHIAL BIOPSIES;  Surgeon: Leslye Peer, MD;  Location: [facility] ENDOSCOPY;  Service: Pulmonary;;   BRONCHIAL BIOPSY  03/11/2023   Procedure: BRONCHIAL BIOPSIES;  Surgeon: Leslye Peer, MD;  Location: [facility] ENDOSCOPY;  Service: Pulmonary;;   BRONCHIAL  BRUSHINGS  02/12/2023   Procedure: BRONCHIAL BRUSHINGS;  Surgeon: Leslye Peer, MD;  Location: [facility] ENDOSCOPY;  Service: Pulmonary;;   BRONCHIAL BRUSHINGS  03/11/2023   Procedure: BRONCHIAL BRUSHINGS;  Surgeon: Leslye Peer, MD;  Location: [facility] ENDOSCOPY;  Service: Pulmonary;;   BRONCHIAL NEEDLE ASPIRATION BIOPSY  02/12/2023   Procedure: BRONCHIAL NEEDLE ASPIRATION BIOPSIES;  Surgeon: Leslye Peer, MD;  Location: [facility] ENDOSCOPY;  Service: Pulmonary;;   BRONCHIAL NEEDLE ASPIRATION BIOPSY  03/11/2023   Procedure: BRONCHIAL NEEDLE ASPIRATION BIOPSIES;  Surgeon: Leslye Peer, MD;  Location: [facility] ENDOSCOPY;  Service: Pulmonary;;    BRONCHIAL WASHINGS  02/12/2023   Procedure: BRONCHIAL WASHINGS;  Surgeon: Leslye Peer, MD;  Location[facility] END[facility]COPY;  Service: Pulmonary;;   COLONOSCOPY WITH PROPOFOL N/A 02/03/2023   Procedure: COLONOSCOPY WITH PROPOFOL;  Surgeon: Jeani Hawking, MD;  Location: [facility] ENDOSCOPY;  Service: Gastroenterology;  Laterality: N/A;   ESOPHAGOGASTRODUODENOSCOPY (EGD) WITH PROPOFOL N/A 02/03/2023   Procedure: ESOPHAGOGASTRODUODENOSCOPY (EGD) WITH PROPOFOL;  Surgeon: Jeani Hawking, MD;  Location: [facility] ENDOSCOPY;  Service: Gastroenterology;  Laterality: N/A;   HEMOSTASIS CLIP PLACEMENT  02/03/2023   Procedure: HEMOSTASIS CLIP PLACEMENT;  Surgeon: Jeani Hawking, MD;  Location: [facility] ENDOSCOPY;  Service: Gastroenterology;;  ascending colon polyp   jp drains     s/p masectomy 06/11/11/ Dr. [name], 2 jp drains right chest wall   MASTECTOMY Right    malignant   MASTECTOMY W/ SENTINEL NODE BIOPSY  06/11/2011/Right BReast   Procedure: MASTECTOMY WITH SENTINEL LYMPH NODE BIOPSY;  Surgeon: Ernestene Mention, MD;  Location: [facility] OR;  Service: General;  Laterality: Right;  Right total mastectomy and sentinel lymph node biopsy using lymphatic mapping and blue dye injection.   POLYPECTOMY  02/03/2023   Procedure: POLYPECTOMY;  Surgeon: Jeani Hawking, MD;  Location: [facility] ENDOSCOPY;  Service: Gastroenterology;;  hot snare polypectomy ascending colon   ROBOT ASSISTED LAPAROSCOPIC NEPHRECTOMY Right 04/03/2023   Procedure: XI ROBOTIC ASSISTED RIGHT LAPAROSCOPIC RADICAL NEPHRECTOMY;  Surgeon: Loletta Parish., MD;  Location: [facility] ORS;  Service: Urology;  Laterality: Right;  180 MINUTES   SUBMUCOSAL LIFTING INJECTION  02/03/2023   Procedure: SUBMUCOSAL LIFTING INJECTION;  Surgeon: Jeani Hawking, MD;  Location: [facility] ENDOSCOPY;  Service: Gastroenterology;;  ever lift 16cc ascending colon   TUBAL LIGATION     [place] FINE NEEDLE ASPIRATION WO/ W SMEAR  05/22/10   Left Breast- cyst or abscess    Current Outpatient Medications on File Prior to  Visit  Medication Sig Dispense Refill   amLODipine (NORVASC) 5 MG tablet Take 5 mg by mouth daily. (Patient not taking: Reported on 04/08/2023)     amoxicillin (AMOXIL) 875 MG tablet Take 875 mg by mouth 2 (two) times daily. (Patient not taking: Reported on 04/08/2023)     Ensure (ENSURE) Take 237 mLs by mouth 2 (two) times daily between meals.     HYDROcodone-acetaminophen (NORCO) 5-325 MG tablet Take 1 tablet by mouth every 6 (six) hours as needed for moderate pain (pain score 4-6) or severe pain (pain score 7-10). 20 tablet 0   Iron, Ferrous Sulfate, 325 (65 Fe) MG TABS Take 325 mg by mouth daily. (Patient not taking: Reported on 04/11/2023) 30 tablet 0   lenvatinib 20 mg daily dose (LENVIMA) 2 x 10 MG capsule Take 2 capsules (20 mg total) by mouth daily. Start 1 (10 mg) daily for the first cycle 60 capsule 11   ondansetron (ZOFRAN) 8 MG tablet Take 1 tablet (8 mg total) by mouth every 8 (eight)  hours as needed for nausea or vomiting. 30 tablet 1   oxyCODONE-acetaminophen (PERCOCET) 5-325 MG tablet Take 1-2 tablets by mouth every 6 (six) hours as needed for moderate pain (pain score 4-6). 20 tablet 0   pantoprazole (PROTONIX) 40 MG tablet Take 1 tablet (40 mg total) by mouth 2 (two) times daily. (Patient not taking: Reported on 03/26/2023) 60 tablet 0   prochlorperazine (COMPAZINE) 10 MG tablet Take 1 tablet (10 mg total) by mouth every 6 (six) hours as needed for nausea or vomiting. 30 tablet 1   senna-docusate (SENOKOT-S) 8.6-50 MG tablet Take 1 tablet by mouth 2 (two) times daily. While taking strong pain meds to prevent constipation 10 tablet 0   traMADol (ULTRAM) 50 MG tablet Take 1 tablet (50 mg total) by mouth every 6 (six) hours as needed. (Patient not taking: Reported on 04/11/2023) 20 tablet 0   No current facility-administered medications on file prior to visit.   Allergies  Allergen Reactions   Fosamax [Alendronate Sodium] Other (See Comments)    Aches and pains   Social History    Socioeconomic History   Marital status: Single    Spouse name: Not on file   Number of children: 5   Years of education: Not on file   Highest education level: Not on file  Occupational History   Occupation: Retired  Tobacco Use   Smoking status: Never   Smokeless tobacco: Never  Vaping Use   Vaping status: Never Used  Substance and Sexual Activity   Alcohol use: No   Drug use: No   Sexual activity: Not Currently    Birth control/protection: Post-menopausal  Other Topics Concern   Not on file  Social History Narrative   Not on file   Social Drivers of Health   Financial Resource Strain: Low Risk  (03/25/2023)   Overall Financial Resource Strain (CARDIA)    Difficulty of Paying Living Expenses: Not hard at all  Food Insecurity: No Food Insecurity (04/08/2023)   Hunger Vital Sign    Worried About Running Out of Food in the Last Year: Never true    Ran Out of Food in the Last Year: Never true  Transportation Needs: No Transportation Needs (04/08/2023)   PRAPARE - Administrator, Civil Service (Medical): No    Lack of Transportation (Non-Medical): No  Physical Activity: Sufficiently Active (07/25/2022)   Exercise Vital Sign    Days of Exercise per Week: 7 days    Minutes of Exercise per Session: 150+ min  Stress: No Stress Concern Present (07/25/2022)   [employer] of Occupational Health - Occupational Stress Questionnaire    Feeling of Stress : Not at all  Social Connections: Socially Isolated (07/25/2022)   Social Connection and Isolation Panel [NHANES]    Frequency of Communication with Friends and Family: More than three times a week    Frequency of Social Gatherings with Friends and Family: More than three times a week    Attends Religious Services: Never    Database administrator or Organizations: No    Attends Banker Meetings: Never    Marital Status: Divorced  Catering manager Violence: Not At Risk (04/08/2023)   Humiliation,  Afraid, Rape, and Kick questionnaire    Fear of Current or Ex-Partner: No    Emotionally Abused: No    Physically Abused: No    Sexually Abused: No      Review of Systems  All other systems reviewed and are negative.  Objective:   Physical Exam Vitals reviewed.  Constitutional:      General: She is not in acute distress.    Appearance: She is obese. She is not ill-appearing, toxic-appearing or diaphoretic.  HENT:     Nose: Congestion and rhinorrhea present.     Mouth/Throat:     Mouth: Mucous membranes are dry.     Pharynx: No oropharyngeal exudate or posterior oropharyngeal erythema.  Eyes:     General: Lids are normal.     Extraocular Movements: Extraocular movements intact.     Conjunctiva/sclera: Conjunctivae normal.     Pupils: Pupils are equal, round, and reactive to light.  Neck:     Vascular: No carotid bruit.  Cardiovascular:     Rate and Rhythm: Regular rhythm. Tachycardia present.     Heart sounds: Normal heart sounds. No murmur heard.    No friction rub. No gallop.  Pulmonary:     Effort: Pulmonary effort is normal. No respiratory distress.     Breath sounds: Normal breath sounds. No stridor. No wheezing, rhonchi or rales.    Chest:     Chest wall: Tenderness present.  Abdominal:     General: Bowel sounds are normal. There is no distension.     Palpations: Abdomen is soft.     Tenderness: There is no abdominal tenderness. There is no guarding.  Musculoskeletal:     Cervical back: No tenderness.     Right lower leg: No edema.     Left lower leg: No edema.  Lymphadenopathy:     Cervical: No cervical adenopathy.  Neurological:     General: No focal deficit present.     Mental Status: She is alert and oriented to person, place, and time.     Cranial Nerves: No cranial nerve deficit.     Motor: No weakness.     Gait: Gait normal.         Assessment & Plan:  NSCLC of right lung (HCC)  Clear cell renal cell carcinoma, right (HCC)  Iron  deficiency anemia due to chronic blood loss Fortunately, her most recent hemoglobin in the hospital was 19 less than a week ago.  She is responding dramatically to iron infusions.  She will follow-up as planned with oncology for recurrent iron infusions as needed but at the present time her anemia has certainly resolved.  Second her right clear-cell carcinoma of the kidney has been removed.  However the patient appears to have metastatic lung cancer.  There are multiple pulmonary nodules seen on her most recent CT scan as dictated above.  The patient is starting immunotherapy under the care of oncology on December 19.  She will follow-up as planned with them to determine in the future if the treatment beneficial.  Will treat the pain in her refractive hydrocodone every 6 hours.  I gave her 120.  Hopefully this will be more than enough.  I recommended that they take MiraLAX 1-2 times daily to help prevent severe constipation and then supplement with a stimulant laxative as needed.

## 2023-04-23 NOTE — Telephone Encounter (Signed)
The family want her medications to go to [facility] not [facility]. Deleted pharmacies that family did not want used for their mother and resent prescriptions to [facility].

## 2023-04-24 ENCOUNTER — Other Ambulatory Visit: Payer: Self-pay | Admitting: *Deleted

## 2023-04-24 ENCOUNTER — Encounter: Payer: Self-pay | Admitting: *Deleted

## 2023-04-24 MED ORDER — LENVATINIB (10 MG DAILY DOSE) 10 MG PO CPPK
10.0000 mg | ORAL_CAPSULE | Freq: Every day | ORAL | 0 refills | Status: DC
  Filled 2023-04-25: qty 30, 30d supply, fill #0

## 2023-04-24 NOTE — Patient Outreach (Signed)
Care Management  Transitions of Care Program Transitions of Care Post-discharge week 3   04/24/2023 Name: Diane Paul MRN: [id number] DOB: [birth year]  Subjective: Diane Paul is a 82 y.o. year old female who is a primary care patient of Pickard, Priscille Heidelberg, MD. The Care Management team Engaged with patient Engaged with patient by telephone to assess and address transitions of care needs.   Consent to Services:  Previously consented to 30 day program  Assessment: Patient's daughter reports pt to begin infusion tomorrow and attended a class on 04/23/23 about what to expect with treatments.  Pt has medication on hand for nausea, has all medications and taking as prescribed.           SDOH Interventions    Flowsheet Row Telephone from 04/08/2023 in [facility] Clinical Support from 03/25/2023 in [facility] [place] Clinical Support from 07/25/2022 in [facility] Clinical Support from 07/07/2021 in [facility]  SDOH Interventions      Food Insecurity Interventions Intervention Not Indicated -- Intervention Not Indicated Intervention Not Indicated  Housing Interventions Intervention Not Indicated Intervention Not Indicated Intervention Not Indicated Intervention Not Indicated  Transportation Interventions Intervention Not Indicated -- Intervention Not Indicated Intervention Not Indicated  Utilities Interventions Intervention Not Indicated -- Intervention Not Indicated --  Alcohol Usage Interventions -- -- Intervention Not Indicated (Score <7) --  Financial Strain Interventions -- Intervention Not Indicated Intervention Not Indicated Intervention Not Indicated  Physical Activity Interventions -- -- Intervention Not Indicated Other (Comments)  [Walks to mailbox and works in garden. Encouraged pt to walk more at tolerated.]  Stress Interventions -- -- Intervention Not  Indicated Intervention Not Indicated  Social Connections Interventions -- -- Intervention Not Indicated Intervention Not Indicated        Goals Addressed             This Visit's Progress    Transitions of Care/ pt will have no readmissions within 30 days       Current Barriers:  Knowledge Deficits related to plan of care for management of post op right lap radical nephrectomy  Spoke with patient and permission give to speak with adult daughter Diane Paul who reports pt is doing well, has all medications and taking as prescribed, has all specialist follow up appointments scheduled, pain is controlled well, has not needed to take tramadol, no signs/ symptoms of infection reported Patient followed up with oncologist and will begin treatments this month Patient reports feeling stronger Per daughter, pt begins infusion 04/25/23 tomorrow and had a class they attended yesterday  RNCM Clinical Goal(s):  Patient will verbalize understanding of plan for management of post op right lap radical nephrectomy as evidenced by patient report, review of EMR take all medications exactly as prescribed and will call provider for medication related questions as evidenced by patient report, review of EMR attend all scheduled medical appointments: urology and CT lung biopsy as evidenced by patient report, review of EMR and  through collaboration with RN Care manager, provider, and care team.   Interventions: Evaluation of current treatment plan related to  self management and patient's adherence to plan as established by provider   Post op right lap radical nephrectomy  (Status:  New goal. and Goal on track:  Yes.)  Short Term Goal Evaluation of current treatment plan related to  post op surgery , ADL IADL limitations  self-management and patient's adherence to plan as established by provider. Reviewed all upcoming scheduled appointments Reviewed safety precautions Reinforced signs/ symptoms of  infection and prevention (handwashing, avoiding sick persons, wearing a mask as needed)  Patient Goals/Self-Care Activities: Participate in Transition of Care Program/Attend TOC scheduled calls Notify RN Care Manager of TOC call rescheduling needs Take all medications as prescribed Attend all scheduled provider appointments Call pharmacy for medication refills 3-7 days in advance of running out of medications Call provider office for new concerns or questions  Practice good handwashing, avoid sick people, wear a mask as needed  Follow Up Plan:  Telephone follow up appointment with care management team member scheduled for:  04/29/23 at 2 pm          Plan: Telephone follow up appointment with care management team member scheduled for: 04/29/23 at 2 pm  Irving Shows [facility], BSN RN Care Manager/ Transition of Care [place]/ [facility] [phone]

## 2023-04-24 NOTE — Patient Instructions (Signed)
Visit Information  Thank you for taking time to visit with me today. Please don't hesitate to contact me if I can be of assistance to you before our next scheduled telephone appointment.  Following are the goals we discussed today:   Goals Addressed             This Visit's Progress    Transitions of Care/ pt will have no readmissions within 30 days       Current Barriers:  Knowledge Deficits related to plan of care for management of post op right lap radical nephrectomy  Spoke with patient and permission give to speak with adult daughter Dezmarie Civello who reports pt is doing well, has all medications and taking as prescribed, has all specialist follow up appointments scheduled, pain is controlled well, has not needed to take tramadol, no signs/ symptoms of infection reported Patient followed up with oncologist and will begin treatments this month Patient reports feeling stronger Per daughter, pt begins immunotherapy infusion 04/25/23 tomorrow and had a class they attended yesterday  RNCM Clinical Goal(s):  Patient will verbalize understanding of plan for management of post op right lap radical nephrectomy as evidenced by patient report, review of EMR take all medications exactly as prescribed and will call provider for medication related questions as evidenced by patient report, review of EMR attend all scheduled medical appointments: urology and CT lung biopsy as evidenced by patient report, review of EMR and  through collaboration with RN Care manager, provider, and care team.   Interventions: Evaluation of current treatment plan related to  self management and patient's adherence to plan as established by provider   Post op right lap radical nephrectomy  (Status:  New goal. and Goal on track:  Yes.)  Short Term Goal Evaluation of current treatment plan related to  post op surgery , ADL IADL limitations self-management and patient's adherence to plan as established by  provider. Reviewed all upcoming scheduled appointments Reviewed safety precautions Reinforced signs/ symptoms of infection and prevention (handwashing, avoiding sick persons, wearing a mask as needed)  Patient Goals/Self-Care Activities: Participate in Transition of Care Program/Attend TOC scheduled calls Notify RN Care Manager of TOC call rescheduling needs Take all medications as prescribed Attend all scheduled provider appointments Call pharmacy for medication refills 3-7 days in advance of running out of medications Call provider office for new concerns or questions  Practice good handwashing, avoid sick people, wear a mask as needed  Follow Up Plan:  Telephone follow up appointment with care management team member scheduled for:  04/29/23 at 2 pm           Our next appointment is by telephone on 04/29/23 at 2 pm  Please call the care guide team at [phone] if you need to cancel or reschedule your appointment.   If you are experiencing a Mental Health or Behavioral Health Crisis or need someone to talk to, please call the Suicide and Crisis Lifeline: 988 call the [place] National Suicide Prevention Lifeline: 615 378 1707 or TTY: 289-586-2900 TTY 910-014-2671) to talk to a trained counselor call 1-800-273-TALK (toll free, 24 hour hotline) go to [facility] [address] [place] [phone]) call the [facility] Line: [phone] call 911   The patient verbalized understanding of instructions, educational materials, and care plan provided today and DECLINED offer to receive copy of patient instructions, educational materials, and care plan.   Irving Shows RNC, BSN RN Care Manager/ Transition of Care [place]/ [facility] [phone]

## 2023-04-25 ENCOUNTER — Inpatient Hospital Stay: Payer: Medicare HMO

## 2023-04-25 ENCOUNTER — Other Ambulatory Visit: Payer: Self-pay

## 2023-04-25 ENCOUNTER — Ambulatory Visit (HOSPITAL_COMMUNITY)
Admission: RE | Admit: 2023-04-25 | Discharge: 2023-04-25 | Disposition: A | Discharge status: Home / Self Care | Payer: Medicare HMO | Source: Ambulatory Visit

## 2023-04-25 ENCOUNTER — Other Ambulatory Visit: Payer: Self-pay | Admitting: Pharmacy Technician

## 2023-04-25 ENCOUNTER — Ambulatory Visit: Payer: Medicare HMO

## 2023-04-25 ENCOUNTER — Telehealth: Payer: Self-pay

## 2023-04-25 VITALS — BP 113/81 | HR 103 | Resp 16

## 2023-04-25 VITALS — BP 119/73 | Temp 97.5°F | Resp 15 | Wt 168.9 lb

## 2023-04-25 DIAGNOSIS — C7801 Secondary malignant neoplasm of right lung: Secondary | ICD-10-CM

## 2023-04-25 DIAGNOSIS — Z9189 Other specified personal risk factors, not elsewhere classified: Secondary | ICD-10-CM | POA: Diagnosis not present

## 2023-04-25 DIAGNOSIS — C7802 Secondary malignant neoplasm of left lung: Secondary | ICD-10-CM

## 2023-04-25 DIAGNOSIS — C641 Malignant neoplasm of right kidney, except renal pelvis: Secondary | ICD-10-CM | POA: Insufficient documentation

## 2023-04-25 DIAGNOSIS — R110 Nausea: Secondary | ICD-10-CM

## 2023-04-25 DIAGNOSIS — K5909 Other constipation: Secondary | ICD-10-CM

## 2023-04-25 DIAGNOSIS — I639 Cerebral infarction, unspecified: Secondary | ICD-10-CM | POA: Diagnosis not present

## 2023-04-25 DIAGNOSIS — K5900 Constipation, unspecified: Secondary | ICD-10-CM | POA: Insufficient documentation

## 2023-04-25 DIAGNOSIS — C3492 Malignant neoplasm of unspecified part of left bronchus or lung: Secondary | ICD-10-CM | POA: Diagnosis not present

## 2023-04-25 DIAGNOSIS — R630 Anorexia: Secondary | ICD-10-CM | POA: Diagnosis not present

## 2023-04-25 DIAGNOSIS — Z5112 Encounter for antineoplastic immunotherapy: Secondary | ICD-10-CM | POA: Diagnosis not present

## 2023-04-25 LAB — CBC WITH DIFFERENTIAL (CANCER CENTER ONLY)
Abs Immature Granulocytes: 0.16 10*3/uL — ABNORMAL HIGH (ref 0.00–0.07)
Basophils Absolute: 0 10*3/uL (ref 0.0–0.1)
Basophils Relative: 0 %
Eosinophils Absolute: 0.3 10*3/uL (ref 0.0–0.5)
Eosinophils Relative: 2 %
HCT: 31.7 % — ABNORMAL LOW (ref 36.0–46.0)
Hemoglobin: 9.7 g/dL — ABNORMAL LOW (ref 12.0–15.0)
Immature Granulocytes: 1 %
Lymphocytes Relative: 12 %
Lymphs Abs: 1.7 10*3/uL (ref 0.7–4.0)
MCH: 24.4 pg — ABNORMAL LOW (ref 26.0–34.0)
MCHC: 30.6 g/dL (ref 30.0–36.0)
MCV: 79.8 fL — ABNORMAL LOW (ref 80.0–100.0)
Monocytes Absolute: 1.9 10*3/uL — ABNORMAL HIGH (ref 0.1–1.0)
Monocytes Relative: 14 %
Neutro Abs: 9.4 10*3/uL — ABNORMAL HIGH (ref 1.7–7.7)
Neutrophils Relative %: 71 %
Platelet Count: 485 10*3/uL — ABNORMAL HIGH (ref 150–400)
RBC: 3.97 MIL/uL (ref 3.87–5.11)
RDW: 21.1 % — ABNORMAL HIGH (ref 11.5–15.5)
WBC Count: 13.4 10*3/uL — ABNORMAL HIGH (ref 4.0–10.5)
nRBC: 0 % (ref 0.0–0.2)

## 2023-04-25 LAB — CMP (CANCER CENTER ONLY)
ALT: 7 U/L (ref 0–44)
AST: 8 U/L — ABNORMAL LOW (ref 15–41)
Albumin: 3.3 g/dL — ABNORMAL LOW (ref 3.5–5.0)
Alkaline Phosphatase: 102 U/L (ref 38–126)
Anion gap: 9 (ref 5–15)
BUN: 18 mg/dL (ref 8–23)
CO2: 27 mmol/L (ref 22–32)
Calcium: 9.4 mg/dL (ref 8.9–10.3)
Chloride: 96 mmol/L — ABNORMAL LOW (ref 98–111)
Creatinine: 1.15 mg/dL — ABNORMAL HIGH (ref 0.44–1.00)
GFR, Estimated: 48 mL/min — ABNORMAL LOW (ref >60)
Glucose, Bld: 99 mg/dL (ref 70–99)
Potassium: 4.9 mmol/L (ref 3.5–5.1)
Sodium: 132 mmol/L — ABNORMAL LOW (ref 135–145)
Total Bilirubin: 0.5 mg/dL (ref <1.2)
Total Protein: 7.7 g/dL (ref 6.5–8.1)

## 2023-04-25 LAB — LACTATE DEHYDROGENASE: LDH: 349 U/L — ABNORMAL HIGH (ref 98–192)

## 2023-04-25 LAB — TOTAL PROTEIN, URINE DIPSTICK: Protein, ur: NEGATIVE mg/dL

## 2023-04-25 LAB — FERRITIN: Ferritin: 2945 ng/mL — ABNORMAL HIGH (ref 11–307)

## 2023-04-25 LAB — TSH: TSH: 6.667 u[IU]/mL — ABNORMAL HIGH (ref 0.350–4.500)

## 2023-04-25 MED ORDER — SODIUM CHLORIDE 0.9 % IV SOLN
200.0000 mg | Freq: Once | INTRAVENOUS | Status: AC
  Administered 2023-04-25: 200 mg via INTRAVENOUS
  Filled 2023-04-25: qty 200

## 2023-04-25 MED ORDER — SODIUM CHLORIDE 0.9 % IV SOLN: INTRAVENOUS | Status: DC

## 2023-04-25 MED ORDER — DRONABINOL 5 MG PO CAPS
5.0000 mg | ORAL_CAPSULE | Freq: Two times a day (BID) | ORAL | 0 refills | Status: DC
Start: 2023-04-25 — End: 2023-04-29

## 2023-04-25 MED ORDER — GADOBUTROL 1 MMOL/ML IV SOLN
8.0000 mL | Freq: Once | INTRAVENOUS | Status: AC | PRN
  Administered 2023-04-25: 8 mL via INTRAVENOUS

## 2023-04-25 NOTE — Progress Notes (Signed)
Patient Care Team: Donita Brooks, MD as PCP - General (Family Medicine)  Clinic Day:  04/25/2023  Referring physician: Donita Brooks, MD  ASSESSMENT & PLAN:   Assessment & Plan: 82 y.o.w. 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   Diagnosis: stage IV clear cell RCC Treatment: Right nephrectomy.  Her wound is not completely healed.  I recommend holding to start TKI until she sees me again at the end of this month.  We will proceed with immunotherapy today.  There is no signs of infection.  Malignant neoplasm metastatic to both lungs [facility]) Patient will start pembrolizumab today Start lenvatinib 10 mg on    At risk for side effect of medication Drug monitoring for lenvatinib. Monitor for hypertension Baseline EKG normal QTc LFT, renal function, electrolytes, every 2 weeks for 2 months and then monthly thereafter Baseline TSH and free T4 and then monthly urine dipstick; if 2+ then obtain a 24-hour urine protein Dental exam before and periodically Monitor for signs of bleeding, wound healing, thrombosis Follow up on 12/30 after lab as scheduled.  Nausea without vomiting Compazine twice daily  Zofran as needed.  NSCLC of left lung (HCC) Given G4 RCC with rapid growing metastases, start treatment for mRCC and follow up imaging for NSCLC in 3 months with restaging   Constipation On senokot twice a daily, Miralax daily Increase fiber to 4x/day with fluid.  Decreased appetite Will start Marinol 5 mg twice daily.  Prescription sent to her pharmacy.   Due to poor access, mediport ordered.  She will keep her appointment to see me at the end of this month.  The patient understands the plans discussed today and is in agreement with them.  She knows to contact our office if she develops concerns prior to her next appointment.  [name], MD  [facility] - A DEPT OF [facility] [address] [place] [address] Dept: [phone] Dept Fax: [phone]   Orders Placed This Encounter  Procedures   IR IMAGING GUIDED PORT INSERTION    Standing Status:   Future    Expected Date:   05/02/2023    Expiration Date:   04/24/2024    Reason for Exam (SYMPTOM  OR DIAGNOSIS REQUIRED):   Mediport placement    Preferred Imaging Location?:   [facility]      CHIEF COMPLAINT:  CC: mRCC  Current Treatment:  first line. Pembro. Lenvatinib to be started.  INTERVAL HISTORY:  Diane Paul is here today for repeat clinical assessment.   Overall, appetite is not great.  Energy is poor.  Report of constipation.  She has tried stool softener, MiraLAX.  She has not using fiber with water area.  She only use 4 ounce of fluid.  Some pain over the right side but not worsening.  She denies any bleeding, bloody urine, bloody stool, or bleeding from the wound.  One of the wound is not completely healed over anterior abdomen.  I have reviewed the past medical history, past surgical history, social history and family history with the patient and they are unchanged from previous note.  ALLERGIES:  is allergic to fosamax [alendronate sodium].  MEDICATIONS:  Current Outpatient Medications  Medication Sig Dispense Refill   dronabinol (MARINOL) 5 MG capsule Take 1 capsule (5 mg total) by  mouth 2 (two) times daily before a meal. 60 capsule 0   amLODipine (NORVASC) 5 MG tablet Take 5 mg by mouth daily. (Patient not taking: Reported on 04/08/2023)     amoxicillin (AMOXIL) 875 MG tablet Take 875 mg by mouth 2 (two) times daily. (Patient not taking: Reported on 04/08/2023)     Ensure (ENSURE) Take 237 mLs by mouth 2 (two) times daily between meals.     HYDROcodone-acetaminophen (NORCO) 5-325 MG tablet Take 1 tablet by mouth every 6 (six) hours as needed for moderate pain (pain score 4-6) or severe pain (pain  score 7-10). 120 tablet 0   lenvatinib 10 mg daily dose (LENVIMA) capsule Take 1 capsule (10 mg total) by mouth daily. 30 capsule 0   ondansetron (ZOFRAN) 8 MG tablet Take 1 tablet (8 mg total) by mouth every 8 (eight) hours as needed for nausea or vomiting. 30 tablet 1   oxyCODONE-acetaminophen (PERCOCET) 5-325 MG tablet Take 1-2 tablets by mouth every 6 (six) hours as needed for moderate pain (pain score 4-6). (Patient not taking: Reported on 04/23/2023) 20 tablet 0   pantoprazole (PROTONIX) 40 MG tablet Take 1 tablet (40 mg total) by mouth 2 (two) times daily. (Patient not taking: Reported on 03/26/2023) 60 tablet 0   prochlorperazine (COMPAZINE) 10 MG tablet Take 1 tablet (10 mg total) by mouth every 6 (six) hours as needed for nausea or vomiting. 30 tablet 1   senna-docusate (SENOKOT-S) 8.6-50 MG tablet Take 1 tablet by mouth 2 (two) times daily. While taking strong pain meds to prevent constipation 10 tablet 0   traMADol (ULTRAM) 50 MG tablet Take 1 tablet (50 mg total) by mouth every 6 (six) hours as needed. (Patient not taking: Reported on 04/23/2023) 20 tablet 0   No current facility-administered medications for this visit.   Facility-Administered Medications Ordered in Other Visits  Medication Dose Route Frequency Provider Last Rate Last Admin   0.9 %  sodium chloride infusion   Intravenous Continuous [name], MD 10 mL/hr at 04/25/23 1656 New Bag at 04/25/23 1656   pembrolizumab (KEYTRUDA) 200 mg in sodium chloride 0.9 % 50 mL chemo infusion  200 mg Intravenous Once [name], MD 116 mL/hr at 04/25/23 1659 200 mg at 04/25/23 1659    HISTORY OF PRESENT ILLNESS:   Oncology History  Breast cancer of upper-outer quadrant of right female breast (HCC)  06/11/2011 Surgery   Right breast mastectomy with right axillary sentinel lymph node biopsy 1.3 cm invasive lobular cancer, grade 1, atypical lobular hyperplasia, focal LV I, ER 91%, PR 25%, HER-2 negative, 0/8 lymph nodes T1 cN0  stage IA   07/25/2011 -  Anti-estrogen oral therapy   Letrozole 2.5 mg daily 7 years was a plan   Metastatic renal cell carcinoma, right (HCC)  02/01/2023 Initial Diagnosis   Renal cell carcinoma (HCC)   02/05/2023 Imaging   Bone scan: No evidence of osseous metastatic disease.    04/03/2023 Pathology Results   A. KIDNEY, RIGHT, RADICAL NEPHRECTOMY:  Clear cell renal cell carcinoma with rhabdoid, sarcomatoid Features and  tumor necrosis, nuclear grade 4, size 12.0 cm  Tumor extends into perinephric, renal sinus fat and segmental branches  of renal vein (pT3a)  Ureteral, vascular and all margins of resection are negative for tumor    Benign adrenal gland    04/23/2023 - 04/23/2023 Chemotherapy   Patient is on Treatment Plan : RENAL CELL Pembrolizumab (200) + Axitinib q21d     04/25/2023 -  Chemotherapy   Patient is on Treatment Plan : Clear Cell RCC Pembrolizumab (200) q21d         REVIEW OF SYSTEMS:   All relevant systems were reviewed with the patient and are negative.   VITALS:  Blood pressure 119/73, temperature (!) 97.5 F (36.4 C), temperature source Temporal, resp. rate 15, weight 168 lb 14.4 oz (76.6 kg), SpO2 99%.  Wt Readings from Last 3 Encounters:  04/25/23 168 lb 14.4 oz (76.6 kg)  04/23/23 175 lb (79.4 kg)  04/18/23 180 lb (81.6 kg)    Body mass index is 26.45 kg/m.  Performance status (ECOG): 3 - Symptomatic, >50% confined to bed  PHYSICAL EXAM:   GENERAL:alert, no distress and comfortable SKIN: skin color normal  EYES: normal, sclera clear OROPHARYNX: no exudate, no erythema    NECK: supple,  non-tender, without nodularity LYMPH:  no palpable cervical lymphadenopathy LUNGS: clear to auscultation with normal breathing effort.  No wheeze or rales HEART: Tachycardic ABDOMEN: abdomen soft, non-tender and nondistended Right anterior surgical incision not completely healed.  No signs of erythema or infection. Musculoskeletal: no edema NEURO:  alert, cranial nerve intact  LABORATORY DATA:  I have reviewed the data as listed    Component Value Date/Time   NA 132 (L) 04/25/2023 1445   NA 141 08/05/2015 0952   K 4.9 04/25/2023 1445   K 4.1 08/05/2015 0952   CL 96 (L) 04/25/2023 1445   CL 108 (H) 09/19/2012 1156   CO2 27 04/25/2023 1445   CO2 24 08/05/2015 0952   GLUCOSE 99 04/25/2023 1445   GLUCOSE 109 08/05/2015 0952   GLUCOSE 101 (H) 09/19/2012 1156   BUN 18 04/25/2023 1445   BUN 20.9 08/05/2015 0952   CREATININE 1.15 (H) 04/25/2023 1445   CREATININE 0.72 03/08/2023 1236   CREATININE 1.1 08/05/2015 0952   CALCIUM 9.4 04/25/2023 1445   CALCIUM 9.4 08/05/2015 0952   PROT 7.7 04/25/2023 1445   PROT 7.8 08/05/2015 0952   ALBUMIN 3.3 (L) 04/25/2023 1445   ALBUMIN 3.7 08/05/2015 0952   AST 8 (L) 04/25/2023 1445   AST 16 08/05/2015 0952   ALT 7 04/25/2023 1445   ALT 17 08/05/2015 0952   ALKPHOS 102 04/25/2023 1445   ALKPHOS 104 08/05/2015 0952   BILITOT 0.5 04/25/2023 1445   BILITOT <0.30 08/05/2015 0952   GFRNONAA 48 (L) 04/25/2023 1445   GFRNONAA 39 (L) 08/12/2020 1502   GFRAA 45 (L) 08/12/2020 1502    No results found for: "SPEP", "UPEP"  Lab Results  Component Value Date   WBC 13.4 (H) 04/25/2023   NEUTROABS 9.4 (H) 04/25/2023   HGB 9.7 (L) 04/25/2023   HCT 31.7 (L) 04/25/2023   MCV 79.8 (L) 04/25/2023   PLT 485 (H) 04/25/2023      Chemistry      Component Value Date/Time   NA 132 (L) 04/25/2023 1445   NA 141 08/05/2015 0952   K 4.9 04/25/2023 1445   K 4.1 08/05/2015 0952   CL 96 (L) 04/25/2023 1445   CL 108 (H) 09/19/2012 1156   CO2 27 04/25/2023 1445   CO2 24 08/05/2015 0952   BUN 18 04/25/2023 1445   BUN 20.9 08/05/2015 0952   CREATININE 1.15 (H) 04/25/2023 1445   CREATININE 0.72 03/08/2023 1236   CREATININE 1.1 08/05/2015 0952      Component Value Date/Time   CALCIUM 9.4 04/25/2023 1445   CALCIUM 9.4 08/05/2015 0952   ALKPHOS 102 04/25/2023 1445   ALKPHOS 104  08/05/2015 0952   AST  8 (L) 04/25/2023 1445   AST 16 08/05/2015 0952   ALT 7 04/25/2023 1445   ALT 17 08/05/2015 0952   BILITOT 0.5 04/25/2023 1445   BILITOT <0.30 08/05/2015 8295       RADIOGRAPHIC STUDIES: I have personally reviewed the radiological images as listed and agreed with the findings in the report. CT Angio Chest PE W/Cm &/Or Wo Cm Result Date: 04/18/2023 CLINICAL DATA:  Patient underwent laparoscopic right nephrectomy for clear cell carcinoma 04/03/2023, presents with postoperative abdominal pain, flank and thoracic pain. She also had a right mastectomy in 2013 for carcinoma. EXAM: CT ANGIOGRAPHY CHEST CT ABDOMEN AND PELVIS WITH CONTRAST TECHNIQUE: Multidetector CT imaging of the chest was performed using the standard protocol during bolus administration of intravenous contrast. Multiplanar CT image reconstructions and MIPs were obtained to evaluate the vascular anatomy. Multidetector CT imaging of the abdomen and pelvis was performed using the standard protocol during bolus administration of intravenous contrast. RADIATION DOSE REDUCTION: This exam was performed according to the departmental dose-optimization program which includes automated exposure control, adjustment of the mA and/or kV according to patient size and/or use of iterative reconstruction technique. CONTRAST:  60mL OMNIPAQUE IOHEXOL 350 MG/ML SOLN COMPARISON:  Portable chest today, chest radiograph 03/11/2023, PET-CT 02/26/2023, chest CT with contrast 02/01/2023, and CTA abdomen and pelvis also done 02/01/2023. FINDINGS: CTA CHEST FINDINGS Cardiovascular: The pulmonary trunk slightly prominent 2.8 cm suggesting arterial hypertension or fluid overload. No findings of acute right heart strain. There is no visible arterial embolus. There is atherosclerosis with mild scattered calcific plaque of the aorta and great vessels. There is no aneurysm, stenosis or dissection. Mild aortic tortuosity. The cardiac size is normal. No pericardial effusion.  There is no visible coronary calcification. No venous dilatation. Mediastinum/Nodes: Interval increased prominence of AP window lymph nodes, largest is 10 mm short axis on 2:46. Previously was 5 mm. Interval increased prominence of subcarinal lymph nodes up to 1.2 cm in short axis on 2:61 with a few bilateral borderline prominent hilar nodes. There is no bulky or encasing adenopathy, no axillary adenopathy. Stable 1.5 cm heterogeneous right thyroid nodule is again noted with dystrophic calcifications. This has not changed in size since the chest CT from January 2012 therefore not requiring follow-up. The thoracic trachea, the bilateral main bronchi, thoracic esophagus unremarkable. There is a tiny hiatal hernia. Lungs/Pleura: Progressive metastatic lung disease since the 02/26/2023 PET-CT. Most of the metastases show varying degrees of central necrosis. There is a new paramediastinal mass in the right upper lobe measuring 2.7 x 2.8 cm on 3:24. There is a new lobular pleural based mass posteriorly in the right upper lobe measuring 3.7 x 3.9 cm on 3:31. Above this an interval enlarged mass measuring 2.1 x 1.7 cm on 3:24, previously 1.9 x 1.3 cm. Elsewhere, there are new nodules in the base of the right upper lobe measuring 1 cm and 0.5 cm both on 3:46; undo right middle lobe metastasis measuring 1.2 cm on 3:38; enlarging right lower lobe nodule measuring 1.4 cm on 3:83 which was only 5 mm previously; enlarging left lower lobe nodule measuring 2.6 x 1.3 cm on 3:74, previously 1.1 x 0.7 cm; enlarging left upper lobe nodule measuring 1.8 x 1.6 cm on 3:53, previously was 1.3 x 1 cm; and several other additional small new nodules scattered within the left upper and both lower lobes. There is linear atelectasis in the lung bases without focal consolidation. Mild bronchial thickening in both  lower lobes. There are trace pleural effusions.  No pneumothorax. Musculoskeletal: No destructive bone lesions are seen, but there is a  new nondisplaced fracture of the lateral right eighth rib which could be pathologic as there is lucency surrounding the fracture which was not seen previously. Review of the MIP images confirms the above findings. CT ABDOMEN and PELVIS FINDINGS Hepatobiliary: Gallbladder dilatation is similar to prior studies. The gallbladder is packed with stones, largest is 3.6 cm. No wall thickening or biliary dilatation is seen. There is no mass enhancement of the liver. Pancreas: Partially atrophic.  Otherwise unremarkable. Spleen: No abnormality. Adrenals/Urinary Tract: No adrenal mass. There are Bosniak 1 cysts of the left kidney, 2.1 cm in the lower pole and 1.4 cm midpole. Interval right nephrectomy. There is an elongate fluid collection in Morrison's pouch just below the level of the right adrenal gland measuring 6.4 x 1.8 cm and 12.5 Hounsfield units which is most likely a seroma. There is no space-occupying hematoma, no abscess is seen. No mass or adenopathy is evident in the right nephrectomy bed. No urinary stone or obstruction. There are low ureteral insertions consistent with pelvic floor laxity and a small cystocele again noted. The bladder is unremarkable. Stomach/Bowel: No dilatation or wall thickening. Normal appendix. Uncomplicated sigmoid diverticulosis. Endoscopy clips again noted in the ascending colon. Uncomplicated sigmoid diverticulosis. Vascular/Lymphatic: Aortic atherosclerosis. No enlarged abdominal or pelvic lymph nodes. Reproductive: Status post hysterectomy. No adnexal masses. Other: There is a heterogeneous low-density subcutaneous collection in the right upper quadrant, ranging 07/25/2023 Hounsfield units and measuring 7.5 x 3.7 x 10.5 cm in total. This is probably a hematoma related to 1 of the trocar insertion sites. Infectious process is not excluded. There is patchy soft tissue gas overlying the anterior wall on both sides of the abdomen beginning just below the ribcage on the right and  continuing down over the low anterior pelvic wall bilaterally, where there is more moderate patchy subcutaneous air. This is not associated with any fluid collection and is probably related to the recent nephrectomy. Correlate clinically for infectious process. There is no pelvic ascites. There is no free hemorrhage or free air. No incarcerated hernia. Musculoskeletal: There is an ill-defined lytic lesion, most likely a metastasis, in the posterior left ilium measuring 2.9 cm on 5:70, which was hypermetabolic on the PET-CT. There is no other visible bone metastasis. There are degenerative changes of the spine. Review of the MIP images confirms the above findings. IMPRESSION: 1. No visible arterial embolus. 2. Prominent pulmonary trunk suggesting arterial hypertension or fluid overload. No findings of acute right heart strain. 3. Progressive metastatic lung disease with both new and interval enlarging masses. 4. New nondisplaced fracture of the lateral right eighth rib which could be pathologic as there is lucency surrounding the fracture which was not seen previously. 5. Trace pleural effusions. 6. Interval right nephrectomy with 6.4 x 1.8 cm fluid collection in Morrison's pouch just below the level of the right adrenal gland, most likely a seroma. No space-occupying hematoma, no abscess is seen. 7. 7.5 x 3.7 x 10.5 cm heterogeneous low-density subcutaneous collection in the right upper quadrant, probably a hematoma related to 1 of the trocar insertion sites. Infectious process is not excluded. 8. Patchy soft tissue gas overlying the anterior wall on both sides of the abdomen and pelvis, probably related to the recent nephrectomy. Correlate clinically for infectious process. 9. 2.9 cm lytic lesion in the posterior left ilium, most likely a metastasis, which was hypermetabolic on  the PET-CT. No other visible bone metastasis at the level of the abdomen and pelvis. 10. Cholelithiasis with stable gallbladder dilatation.  11. Pelvic floor laxity with low ureteral insertions and a small cystocele. 12. Diverticulosis without evidence of diverticulitis. 13. Aortic atherosclerosis. Aortic Atherosclerosis (ICD10-I70.0). Electronically Signed   By: Almira Bar M.D.   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 She also had a right mastectomy in 2013 for carcinoma. EXAM: CT ANGIOGRAPHY CHEST CT ABDOMEN AND PELVIS WITH CONTRAST TECHNIQUE: Multidetector CT imaging of the chest was performed using the standard protocol during bolus administration of intravenous contrast. Multiplanar CT image reconstructions and MIPs were obtained to evaluate the vascular anatomy. Multidetector CT imaging of the abdomen and pelvis was performed using the standard protocol during bolus administration of intravenous contrast. RADIATION DOSE REDUCTION: This exam was performed according to the departmental dose-optimization program which includes automated exposure control, adjustment of the mA and/or kV according to patient size and/or use of iterative reconstruction technique. CONTRAST:  60mL OMNIPAQUE IOHEXOL 350 MG/ML SOLN COMPARISON:  Portable chest today, chest radiograph 03/11/2023, PET-CT 02/26/2023, chest CT with contrast 02/01/2023, and CTA abdomen and pelvis also done 02/01/2023. FINDINGS: CTA CHEST FINDINGS Cardiovascular: The pulmonary trunk slightly prominent 2.8 cm suggesting arterial hypertension or fluid overload. No findings of acute right heart strain. There is no visible arterial embolus. There is atherosclerosis with mild scattered calcific plaque of the aorta and great vessels. There is no aneurysm, stenosis or dissection. Mild aortic tortuosity. The cardiac size is normal. No pericardial effusion. There is no visible coronary  calcification. No venous dilatation. Mediastinum/Nodes: Interval increased prominence of AP window lymph nodes, largest is 10 mm short axis on 2:46. Previously was 5 mm. Interval increased prominence of subcarinal lymph nodes up to 1.2 cm in short axis on 2:61 with a few bilateral borderline prominent hilar nodes. There is no bulky or encasing adenopathy, no axillary adenopathy. Stable 1.5 cm heterogeneous right thyroid nodule is again noted with dystrophic calcifications. This has not changed in size since the chest CT from January 2012 therefore not requiring follow-up. The thoracic trachea, the bilateral main bronchi, thoracic esophagus unremarkable. There is a tiny hiatal hernia. Lungs/Pleura: Progressive metastatic lung disease since the 02/26/2023 PET-CT. Most of the metastases show varying degrees of central necrosis. There is a new paramediastinal mass in the right upper lobe measuring 2.7 x 2.8 cm on 3:24. There is a new lobular pleural based mass posteriorly in the right upper lobe measuring 3.7 x 3.9 cm on 3:31. Above this an interval enlarged mass measuring 2.1 x 1.7 cm on 3:24, previously 1.9 x 1.3 cm. Elsewhere, there are new nodules in the base of the right upper lobe measuring 1 cm and 0.5 cm both on 3:46; undo right middle lobe metastasis measuring 1.2 cm on 3:38; enlarging right lower lobe nodule measuring 1.4 cm on 3:83 which was only 5 mm previously; enlarging left lower lobe nodule measuring 2.6 x 1.3 cm on 3:74, previously 1.1 x 0.7 cm; enlarging left upper lobe nodule measuring 1.8 x 1.6 cm on 3:53, previously was 1.3 x 1 cm; and several other additional small new nodules scattered within the left upper and both lower lobes. There is linear atelectasis in the lung bases without focal consolidation. Mild bronchial thickening in both lower lobes. There are trace pleural  effusions.  No pneumothorax. Musculoskeletal: No destructive bone lesions are seen, but there is a new nondisplaced fracture of  the lateral right eighth rib which could be pathologic as there is lucency surrounding the fracture which was not seen previously. Review of the MIP images confirms the above findings. CT ABDOMEN and PELVIS FINDINGS Hepatobiliary: Gallbladder dilatation is similar to prior studies. The gallbladder is packed with stones, largest is 3.6 cm. No wall thickening or biliary dilatation is seen. There is no mass enhancement of the liver. Pancreas: Partially atrophic.  Otherwise unremarkable. Spleen: No abnormality. Adrenals/Urinary Tract: No adrenal mass. There are Bosniak 1 cysts of the left kidney, 2.1 cm in the lower pole and 1.4 cm midpole. Interval right nephrectomy. There is an elongate fluid collection in Morrison's pouch just below the level of the right adrenal gland measuring 6.4 x 1.8 cm and 12.5 Hounsfield units which is most likely a seroma. There is no space-occupying hematoma, no abscess is seen. No mass or adenopathy is evident in the right nephrectomy bed. No urinary stone or obstruction. There are low ureteral insertions consistent with pelvic floor laxity and a small cystocele again noted. The bladder is unremarkable. Stomach/Bowel: No dilatation or wall thickening. Normal appendix. Uncomplicated sigmoid diverticulosis. Endoscopy clips again noted in the ascending colon. Uncomplicated sigmoid diverticulosis. Vascular/Lymphatic: Aortic atherosclerosis. No enlarged abdominal or pelvic lymph nodes. Reproductive: Status post hysterectomy. No adnexal masses. Other: There is a heterogeneous low-density subcutaneous collection in the right upper quadrant, ranging 07/25/2023 Hounsfield units and measuring 7.5 x 3.7 x 10.5 cm in total. This is probably a hematoma related to 1 of the trocar insertion sites. Infectious process is not excluded. There is patchy soft tissue gas overlying the anterior wall on both sides of the abdomen beginning just below the ribcage on the right and continuing down over the low  anterior pelvic wall bilaterally, where there is more moderate patchy subcutaneous air. This is not associated with any fluid collection and is probably related to the recent nephrectomy. Correlate clinically for infectious process. There is no pelvic ascites. There is no free hemorrhage or free air. No incarcerated hernia. Musculoskeletal: There is an ill-defined lytic lesion, most likely a metastasis, in the posterior left ilium measuring 2.9 cm on 5:70, which was hypermetabolic on the PET-CT. There is no other visible bone metastasis. There are degenerative changes of the spine. Review of the MIP images confirms the above findings. IMPRESSION: 1. No visible arterial embolus. 2. Prominent pulmonary trunk suggesting arterial hypertension or fluid overload. No findings of acute right heart strain. 3. Progressive metastatic lung disease with both new and interval enlarging masses. 4. New nondisplaced fracture of the lateral right eighth rib which could be pathologic as there is lucency surrounding the fracture which was not seen previously. 5. Trace pleural effusions. 6. Interval right nephrectomy with 6.4 x 1.8 cm fluid collection in Morrison's pouch just below the level of the right adrenal gland, most likely a seroma. No space-occupying hematoma, no abscess is seen. 7. 7.5 x 3.7 x 10.5 cm heterogeneous low-density subcutaneous collection in the right upper quadrant, probably a hematoma related to 1 of the trocar insertion sites. Infectious process is not excluded. 8. Patchy soft tissue gas overlying the anterior wall on both sides of the abdomen and pelvis, probably related to the recent nephrectomy. Correlate clinically for infectious process. 9. 2.9 cm lytic lesion in the posterior left ilium, most likely a metastasis, which was hypermetabolic on the PET-CT. No other visible bone  metastasis at the level of the abdomen and pelvis. 10. Cholelithiasis with stable gallbladder dilatation. 11. Pelvic floor laxity with  low ureteral insertions and a small cystocele. 12. Diverticulosis without evidence of diverticulitis. 13. Aortic atherosclerosis. Aortic Atherosclerosis (ICD10-I70.0). Electronically Signed   By: Almira Bar M.D.   On: 04/18/2023 23:38   CT CHEST LIMITED WO CONTRAST Result Date: 04/18/2023 CLINICAL DATA:  82 year old female presenting for lung mass biopsy. EXAM: CT CHEST WITHOUT CONTRAST TECHNIQUE: Multidetector CT imaging of the chest was performed following the standard protocol without IV contrast. RADIATION DOSE REDUCTION: This exam was performed according to the departmental dose-optimization program which includes automated exposure control, adjustment of the mA and/or kV according to patient size and/or use of iterative reconstruction technique. COMPARISON:  PET-CT from 02/26/2023, CT chest from 02/01/2023 FINDINGS: Cardiovascular: Mild scattered atherosclerotic calcifications about the aortic arch. Normal heart size. No pericardial effusion. Mediastinum/Nodes: There are few small scattered mediastinal and hilar lymph nodes, similar to comparison. Unchanged peripherally calcified right thyroid nodule. Lungs/Pleura: Small right apical pneumothorax. Interval progression of multifocal bilateral solid pulmonary nodules, the largest measuring up to 3.9 cm in the right upper lobe, previously approximally 1.8 cm on PET-CT. Interval development of trace right pleural effusion. Upper Abdomen: No acute abnormality. Musculoskeletal: No chest wall mass or suspicious bone lesions identified. IMPRESSION: 1. Small right apical pneumothorax. 2. Interval progression of multifocal bilateral solid pulmonary nodules, suggestive of progressing metastatic disease. 3. Interval development of trace right pleural effusion. 4. Artic atherosclerosis (ICD10-I70.0). These results, in addition to cancellation of planned biopsy, were called by telephone on 04/10/2023 to provider [facility] , who verbally acknowledged these results.  Marliss Coots, MD [facility] Radiology Electronically Signed   By: Marliss Coots M.D.   On: 04/18/2023 15:42   DG Chest Portable 1 View Result Date: 04/18/2023 CLINICAL DATA:  Right flank pain. Cough. History of renal cell carcinoma with pulmonary metastatic disease. EXAM: PORTABLE CHEST 1 VIEW COMPARISON:  Radiographs 03/11/2023 and 02/12/2023. PET-CT 02/26/2023. FINDINGS: 1435 hours. Interval improved aeration of both lungs with decreased bilateral pulmonary opacities since 03/11/2023. There is mild residual left lower lobe atelectasis. Multiple pulmonary nodules are again noted which appear progressive. There is no pleural effusion or pneumothorax. The heart size and mediastinal contours are stable. IMPRESSION: 1. Interval improved aeration of both lungs with decreased bilateral pulmonary opacities since 03/11/2023. 2. Progressive pulmonary metastatic disease. 3. No definite acute findings. Electronically Signed   By: Carey Bullocks M.D.   On: 04/18/2023 15:32

## 2023-04-25 NOTE — Assessment & Plan Note (Signed)
Compazine twice daily  Zofran as needed.

## 2023-04-25 NOTE — Assessment & Plan Note (Signed)
Will start Marinol 5 mg twice daily.  Prescription sent to her pharmacy.

## 2023-04-25 NOTE — Telephone Encounter (Signed)
T/C from pt's daughter stating her mother was having to strain to have a BM and ended up vomiting twice. Also having back pain but no fever.  Advised to take her prescription/compazine and if she cannot keep it down to call back.  Also advised for now to keep her appt for today to be evaluated but may not get treatment.  Daughter agreed to this plan of care.

## 2023-04-25 NOTE — Assessment & Plan Note (Signed)
 Given G4 RCC with rapid growing metastases, start treatment for mRCC and follow up imaging for NSCLC in 3 months with restaging.

## 2023-04-25 NOTE — Progress Notes (Signed)
Specialty Pharmacy Initial Fill Coordination Note  Diane Paul is a 82 y.o. female contacted today regarding refills of specialty medication(s) Lenvatinib Mesylate (LENVIMA) .  Patient requested Delivery  on 04/30/23  to verified address [address]   [place] [address]-   Medication will be filled on 04/29/23.   Patient is aware of $0 copayment.

## 2023-04-25 NOTE — Assessment & Plan Note (Signed)
Patient will start pembrolizumab today Start lenvatinib 10 mg on

## 2023-04-25 NOTE — Progress Notes (Signed)
Dr Cherly Hensen aware HR 111, pt ok for tx today.

## 2023-04-25 NOTE — Assessment & Plan Note (Signed)
On senokot twice a daily, Miralax daily Increase fiber to 4x/day with fluid.

## 2023-04-25 NOTE — Assessment & Plan Note (Signed)
Drug monitoring for lenvatinib. Monitor for hypertension Baseline EKG normal QTc LFT, renal function, electrolytes, every 2 weeks for 2 months and then monthly thereafter Baseline TSH and free T4 and then monthly urine dipstick; if 2+ then obtain a 24-hour urine protein Dental exam before and periodically Monitor for signs of bleeding, wound healing, thrombosis Follow up on 12/30 after lab as scheduled.

## 2023-04-25 NOTE — Patient Instructions (Signed)
 [facility] - A DEPT OF [facility]  Discharge Instructions: Thank you for choosing [facility] to provide your oncology and hematology care.   If you have a lab appointment with the Cancer Center, please go directly to the Cancer Center and check in at the registration area.   Wear comfortable clothing and clothing appropriate for easy access to any Portacath or PICC line.   We strive to give you quality time with your provider. You may need to reschedule your appointment if you arrive late (15 or more minutes).  Arriving late affects you and other patients whose appointments are after yours.  Also, if you miss three or more appointments without notifying the office, you may be dismissed from the clinic at the provider's discretion.      For prescription refill requests, have your pharmacy contact our office and allow 72 hours for refills to be completed.    Today you received the following chemotherapy and/or immunotherapy agents: pembrolizumab      To help prevent nausea and vomiting after your treatment, we encourage you to take your nausea medication as directed.  BELOW ARE SYMPTOMS THAT SHOULD BE REPORTED IMMEDIATELY: *FEVER GREATER THAN 100.4 F (38 C) OR HIGHER *CHILLS OR SWEATING *NAUSEA AND VOMITING THAT IS NOT CONTROLLED WITH YOUR NAUSEA MEDICATION *UNUSUAL SHORTNESS OF BREATH *UNUSUAL BRUISING OR BLEEDING *URINARY PROBLEMS (pain or burning when urinating, or frequent urination) *BOWEL PROBLEMS (unusual diarrhea, constipation, pain near the anus) TENDERNESS IN MOUTH AND THROAT WITH OR WITHOUT PRESENCE OF ULCERS (sore throat, sores in mouth, or a toothache) UNUSUAL RASH, SWELLING OR PAIN  UNUSUAL VAGINAL DISCHARGE OR ITCHING   Items with * indicate a potential emergency and should be followed up as soon as possible or go to the Emergency Department if any problems should occur.  Please show the CHEMOTHERAPY ALERT CARD or  IMMUNOTHERAPY ALERT CARD at check-in to the Emergency Department and triage nurse.  Should you have questions after your visit or need to cancel or reschedule your appointment, please contact [facility] - A [facility]  Dept: [phone]  and follow the prompts.  Office hours are 8:00 a.m. to 4:30 p.m. Monday - Friday. Please note that voicemails left after 4:00 p.m. may not be returned until the following business day.  We are closed weekends and major holidays. You have access to a nurse at all times for urgent questions. Please call the main number to the clinic Dept: [phone] and follow the prompts.   For any non-urgent questions, you may also contact your provider using MyChart. We now offer e-Visits for anyone 55 and older to request care online for non-urgent symptoms. For details visit [email or web address].   Also download the MyChart app! Go to the app store, search "MyChart", open the app, select [place], and log in with your MyChart username and password.

## 2023-04-26 ENCOUNTER — Other Ambulatory Visit: Payer: Self-pay

## 2023-04-26 ENCOUNTER — Ambulatory Visit (HOSPITAL_COMMUNITY)
Admission: RE | Admit: 2023-04-26 | Discharge: 2023-04-26 | Disposition: A | Discharge status: Home / Self Care | Payer: Medicare HMO | Source: Ambulatory Visit

## 2023-04-26 ENCOUNTER — Ambulatory Visit: Payer: Medicare HMO

## 2023-04-26 VITALS — BP 101/67 | HR 99 | Temp 97.9°F | Resp 18 | Ht 67.0 in | Wt 175.4 lb

## 2023-04-26 DIAGNOSIS — Z7969 Long term (current) use of other immunomodulators and immunosuppressants: Secondary | ICD-10-CM | POA: Insufficient documentation

## 2023-04-26 DIAGNOSIS — Z9189 Other specified personal risk factors, not elsewhere classified: Secondary | ICD-10-CM | POA: Diagnosis present

## 2023-04-26 DIAGNOSIS — Z0189 Encounter for other specified special examinations: Secondary | ICD-10-CM | POA: Diagnosis not present

## 2023-04-26 DIAGNOSIS — I5189 Other ill-defined heart diseases: Secondary | ICD-10-CM | POA: Diagnosis present

## 2023-04-26 DIAGNOSIS — C641 Malignant neoplasm of right kidney, except renal pelvis: Secondary | ICD-10-CM | POA: Insufficient documentation

## 2023-04-26 DIAGNOSIS — D509 Iron deficiency anemia, unspecified: Secondary | ICD-10-CM

## 2023-04-26 LAB — ECHOCARDIOGRAM COMPLETE
Area-P 1/2: 5.7 cm2
Calc EF: 43.3 %
S' Lateral: 3.2 cm
Single Plane A2C EF: 44.5 %
Single Plane A4C EF: 43.4 %

## 2023-04-26 LAB — T4: T4, Total: 8.9 ug/dL (ref 4.5–12.0)

## 2023-04-26 MED ORDER — DIPHENHYDRAMINE HCL 25 MG PO CAPS
25.0000 mg | ORAL_CAPSULE | Freq: Once | ORAL | Status: AC
  Administered 2023-04-26: 25 mg via ORAL
  Filled 2023-04-26: qty 1

## 2023-04-26 MED ORDER — ACETAMINOPHEN 325 MG PO TABS
650.0000 mg | ORAL_TABLET | Freq: Once | ORAL | Status: AC
  Administered 2023-04-26: 650 mg via ORAL
  Filled 2023-04-26: qty 2

## 2023-04-26 MED ORDER — IRON SUCROSE 20 MG/ML IV SOLN
200.0000 mg | Freq: Once | INTRAVENOUS | Status: AC
  Administered 2023-04-26: 200 mg via INTRAVENOUS
  Filled 2023-04-26: qty 10

## 2023-04-26 NOTE — Progress Notes (Signed)
Patient counseled in infusion room and telephone encounter opened on 04/11/23.   Bethel Born, PharmD Hematology/Oncology Clinical Pharmacist [facility] [phone]

## 2023-04-26 NOTE — Patient Instructions (Signed)
Iron Sucrose Injection What is this medication? IRON SUCROSE (EYE ern SOO krose) treats low levels of iron (iron deficiency anemia) in people with kidney disease. Iron is a mineral that plays an important role in making red blood cells, which carry oxygen from your lungs to the rest of your body. This medicine may be used for other purposes; ask your health care provider or pharmacist if you have questions. COMMON BRAND NAME(S): Venofer What should I tell my care team before I take this medication? They need to know if you have any of these conditions: Anemia not caused by low iron levels Heart disease High levels of iron in the blood Kidney disease Liver disease An unusual or allergic reaction to iron, other medications, foods, dyes, or preservatives Pregnant or trying to get pregnant Breastfeeding How should I use this medication? This medication is for infusion into a vein. It is given in a hospital or clinic setting. Talk to your care team about the use of this medication in children. While this medication may be prescribed for children as young as 2 years for selected conditions, precautions do apply. Overdosage: If you think you have taken too much of this medicine contact a poison control center or emergency room at once. NOTE: This medicine is only for you. Do not share this medicine with others. What if I miss a dose? Keep appointments for follow-up doses. It is important not to miss your dose. Call your care team if you are unable to keep an appointment. What may interact with this medication? Do not take this medication with any of the following: Deferoxamine Dimercaprol Other iron products This medication may also interact with the following: Chloramphenicol Deferasirox This list may not describe all possible interactions. Give your health care provider a list of all the medicines, herbs, non-prescription drugs, or dietary supplements you use. Also tell them if you smoke,  drink alcohol, or use illegal drugs. Some items may interact with your medicine. What should I watch for while using this medication? Visit your care team regularly. Tell your care team if your symptoms do not start to get better or if they get worse. You may need blood work done while you are taking this medication. You may need to follow a special diet. Talk to your care team. Foods that contain iron include: whole grains/cereals, dried fruits, beans, or peas, leafy green vegetables, and organ meats (liver, kidney). What side effects may I notice from receiving this medication? Side effects that you should report to your care team as soon as possible: Allergic reactions--skin rash, itching, hives, swelling of the face, lips, tongue, or throat Low blood pressure--dizziness, feeling faint or lightheaded, blurry vision Shortness of breath Side effects that usually do not require medical attention (report to your care team if they continue or are bothersome): Flushing Headache Joint pain Muscle pain Nausea Pain, redness, or irritation at injection site This list may not describe all possible side effects. Call your doctor for medical advice about side effects. You may report side effects to FDA at 1-800-FDA-1088. Where should I keep my medication? This medication is given in a hospital or clinic. It will not be stored at home. NOTE: This sheet is a summary. It may not cover all possible information. If you have questions about this medicine, talk to your doctor, pharmacist, or health care provider.  2024 Elsevier/Gold Standard (2022-09-28 00:00:00)

## 2023-04-26 NOTE — Progress Notes (Signed)
 Diagnosis: Iron Deficiency Anemia  Provider:  Chilton Greathouse MD  Procedure: IV Push  IV Type: Peripheral, IV Location: L Antecubital  Venofer (Iron Sucrose), Dose: 200 mg  Post Infusion IV Care: Observation period completed and Peripheral IV Discontinued  Discharge: Condition: Good, Destination: Home . AVS Provided  Performed by:  Rico Ala, LPN

## 2023-04-26 NOTE — Telephone Encounter (Signed)
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   Counseled patient on administration, dosing, side effects, monitoring, drug-food interactions, safe handling, storage, and disposal.  Patient will take Lenvima 10mg  capsules, 1 capsules (10mg ) by mouth once daily, with or without food, at approximately the same time each day. Patient will increase dosage after first month if tolerating per MD.   Assunta Curtis start date: pending wound healing after Dr. Cherly Hensen sees patient next  Adverse effects include but are not limited to: hypertension, hand-foot syndrome, diarrhea, joint pain, fatigue, headache, decreased calcium, proteinuria, increased risk of blood clots, and cardiac conduction issues.   Patient will obtain anti diarrheal and alert the office of 4 or more loose stools above baseline.  Patient instructed to notify office of any upcoming invasive procedures.  Assunta Curtis will be held for 6 days prior to scheduled surgery, restart based on healing and clinical judgement.   Reviewed with patient importance of keeping a medication schedule and plan for any missed doses. No barriers to medication adherence identified.  Medication reconciliation performed and medication/allergy list updated.  Patient informed the pharmacy will reach out 5-7 days prior to needing next fill of Lenvima to coordinate continued medication acquisition to prevent break in therapy.  All questions answered. Patient and son voiced understanding and appreciation. Medication education handout placed in mail for patient. Patient knows to call the office with questions or concerns. Oral Chemotherapy Clinic phone number provided to patient.   Bethel Born, PharmD Hematology/Oncology Clinical Pharmacist [facility] [phone] 04/26/2023   8:16 AM

## 2023-04-27 ENCOUNTER — Other Ambulatory Visit: Payer: Self-pay

## 2023-04-27 DIAGNOSIS — C641 Malignant neoplasm of right kidney, except renal pelvis: Secondary | ICD-10-CM

## 2023-04-29 ENCOUNTER — Telehealth: Payer: Self-pay

## 2023-04-29 ENCOUNTER — Other Ambulatory Visit: Payer: Self-pay | Admitting: Internal Medicine

## 2023-04-29 ENCOUNTER — Other Ambulatory Visit: Payer: Self-pay | Admitting: *Deleted

## 2023-04-29 ENCOUNTER — Other Ambulatory Visit: Payer: Self-pay

## 2023-04-29 ENCOUNTER — Other Ambulatory Visit (HOSPITAL_BASED_OUTPATIENT_CLINIC_OR_DEPARTMENT_OTHER): Payer: Self-pay

## 2023-04-29 ENCOUNTER — Other Ambulatory Visit: Payer: Self-pay | Admitting: Radiology

## 2023-04-29 MED ORDER — DRONABINOL 5 MG PO CAPS: 5.0000 mg | ORAL_CAPSULE | Freq: Two times a day (BID) | ORAL | 0 refills | Status: DC

## 2023-04-29 MED ORDER — DRONABINOL 5 MG PO CAPS
5.0000 mg | ORAL_CAPSULE | Freq: Two times a day (BID) | ORAL | 0 refills | Status: DC
  Filled 2023-04-29: qty 30, 15d supply, fill #0

## 2023-04-29 NOTE — Patient Outreach (Signed)
Care Management  Transitions of Care Program Transitions of Care Post-discharge week 4   04/29/2023 Name: Diane Paul MRN: [id number] DOB: May 28, 1940  Subjective: Diane Paul is a 82 y.o. year old female who is a primary care patient of Pickard, Priscille Heidelberg, MD. The Care Management team Engaged with patient Engaged with patient by telephone to assess and address transitions of care needs.   Consent to Services:  Patient previously consented to 30 day program  Assessment: Per daughter, pt is doing well, is using marinol to help increase appetite and this seems to be working well, has all medications and taking as prescribed.         SDOH Interventions    Flowsheet Row Telephone from 04/08/2023 in [place] [facility] DEPARTMENT Clinical Support from 03/25/2023 in [facility] - A Dept Of [facility] Clinical Support from 07/25/2022 in [facility] Clinical Support from 07/07/2021 in [facility]  SDOH Interventions      Food Insecurity Interventions Intervention Not Indicated -- Intervention Not Indicated Intervention Not Indicated  Housing Interventions Intervention Not Indicated Intervention Not Indicated Intervention Not Indicated Intervention Not Indicated  Transportation Interventions Intervention Not Indicated -- Intervention Not Indicated Intervention Not Indicated  Utilities Interventions Intervention Not Indicated -- Intervention Not Indicated --  Alcohol Usage Interventions -- -- Intervention Not Indicated (Score <7) --  Financial Strain Interventions -- Intervention Not Indicated Intervention Not Indicated Intervention Not Indicated  Physical Activity Interventions -- -- Intervention Not Indicated Other (Comments)  [Walks to mailbox and works in garden. Encouraged pt to walk more at tolerated.]  Stress Interventions -- -- Intervention Not Indicated Intervention Not Indicated  Social  Connections Interventions -- -- Intervention Not Indicated Intervention Not Indicated        Goals Addressed             This Visit's Progress    Transitions of Care/ pt will have no readmissions within 30 days       Current Barriers:  Knowledge Deficits related to plan of care for management of post op right lap radical nephrectomy  Spoke with patient and permission give to speak with adult daughter Diane Paul who reports pt is doing well, has all medications and taking as prescribed, has all specialist follow up appointments scheduled, pain is controlled well, has not needed to take tramadol, no signs/ symptoms of infection reported Patient continues to follow up with oncologist Patient reports feeling stronger Per daughter, pt began immunotherapy infusion 04/25/23 tomorrow and attended a class about what to expect, pt is doing well,  pt is trying to eat more and is doing better with this, is on marinol to increase appetite  RNCM Clinical Goal(s):  Patient will verbalize understanding of plan for management of post op right lap radical nephrectomy as evidenced by patient report, review of EMR take all medications exactly as prescribed and will call provider for medication related questions as evidenced by patient report, review of EMR attend all scheduled medical appointments: urology and CT lung biopsy as evidenced by patient report, review of EMR and  through collaboration with RN Care manager, provider, and care team.   Interventions: Evaluation of current treatment plan related to  self management and patient's adherence to plan as established by provider   Post op right lap radical nephrectomy  (Status:  New goal. and Goal on track:  Yes.)  Short Term Goal Evaluation of current  treatment plan related to  post op surgery , ADL IADL limitations self-management and patient's adherence to plan as established by provider. Reviewed all upcoming scheduled appointments Reinforced  safety precautions Reviewed signs/ symptoms of infection and prevention (handwashing, avoiding sick persons, wearing a mask as needed) Reviewed offering small, frequent meals, consuming adequate fluids, taking marinol as prescribed  Patient Goals/Self-Care Activities: Participate in Transition of Care Program/Attend TOC scheduled calls Notify RN Care Manager of TOC call rescheduling needs Take all medications as prescribed Attend all scheduled provider appointments Call pharmacy for medication refills 3-7 days in advance of running out of medications Call provider office for new concerns or questions  Practice good handwashing, avoid sick people, wear a mask as needed Eat small frequent meals Drink adequate fluids, preferably water  Follow Up Plan:  Telephone follow up appointment with care management team member scheduled for:  05/09/23 at 9 am          Plan: Telephone follow up appointment with care management team member scheduled for:  05/09/23 @ 9 am  Irving Shows [facility], BSN RN Care Manager/ Transition of Care [place] Population Health [phone]

## 2023-04-29 NOTE — Patient Instructions (Signed)
Visit Information  Thank you for taking time to visit with me today. Please don't hesitate to contact me if I can be of assistance to you before our next scheduled telephone appointment.  Following are the goals we discussed today:   Goals Addressed             This Visit's Progress    Transitions of Care/ pt will have no readmissions within 30 days       Current Barriers:  Knowledge Deficits related to plan of care for management of post op right lap radical nephrectomy  Spoke with patient and permission give to speak with adult daughter Daneille Winokur who reports pt is doing well, has all medications and taking as prescribed, has all specialist follow up appointments scheduled, pain is controlled well, has not needed to take tramadol, no signs/ symptoms of infection reported Patient continues to follow up with oncologist Patient reports feeling stronger Per daughter, pt began immunotherapy infusion 04/25/23 tomorrow and attended a class about what to expect, pt is doing well,  pt is trying to eat more and is doing better with this, is on marinol to increase appetite  RNCM Clinical Goal(s):  Patient will verbalize understanding of plan for management of post op right lap radical nephrectomy as evidenced by patient report, review of EMR take all medications exactly as prescribed and will call provider for medication related questions as evidenced by patient report, review of EMR attend all scheduled medical appointments: urology and CT lung biopsy as evidenced by patient report, review of EMR and  through collaboration with RN Care manager, provider, and care team.   Interventions: Evaluation of current treatment plan related to  self management and patient's adherence to plan as established by provider   Post op right lap radical nephrectomy  (Status:  New goal. and Goal on track:  Yes.)  Short Term Goal Evaluation of current treatment plan related to  post op surgery , ADL IADL  limitations self-management and patient's adherence to plan as established by provider. Reviewed all upcoming scheduled appointments Reinforced safety precautions Reviewed signs/ symptoms of infection and prevention (handwashing, avoiding sick persons, wearing a mask as needed) Reviewed offering small, frequent meals, consuming adequate fluids, taking marinol as prescribed  Patient Goals/Self-Care Activities: Participate in Transition of Care Program/Attend TOC scheduled calls Notify RN Care Manager of TOC call rescheduling needs Take all medications as prescribed Attend all scheduled provider appointments Call pharmacy for medication refills 3-7 days in advance of running out of medications Call provider office for new concerns or questions  Practice good handwashing, avoid sick people, wear a mask as needed Eat small frequent meals Drink adequate fluids, preferably water  Follow Up Plan:  Telephone follow up appointment with care management team member scheduled for:  05/09/23 at 9 am           Our next appointment is by telephone on 05/09/23 at 9 am  Please call the care guide team at [phone] if you need to cancel or reschedule your appointment.   If you are experiencing a Mental Health or Behavioral Health Crisis or need someone to talk to, please call the Suicide and Crisis Lifeline: 988 call the [place] National Suicide Prevention Lifeline: (512)771-7119 or TTY: (934)428-0787 TTY 8384485236) to talk to a trained counselor call 1-800-273-TALK (toll free, 24 hour hotline) go to [facility] [address], [place] [phone]) call the [facility] Line: [phone] call 911   Patient verbalizes  understanding of instructions and care plan provided today and agrees to view in MyChart. Active MyChart status and patient understanding of how to access instructions and care plan via MyChart confirmed with patient.     Irving Shows [facility], BSN RN Care Manager/ Transition of Care [place]/ [facility] [phone]

## 2023-04-29 NOTE — Telephone Encounter (Signed)
Notified Patient's Son of prior authorization approval for Dronabinol 5 mg Capsules. Medication is approved through 10/26/2023. Patient's preferred Pharmacy is out of medication and it is on backorder. Checked with [facility] [place] in [place] and they can have it tomorrow. Provider notified and will send prescription there.

## 2023-04-29 NOTE — H&P (Signed)
Chief Complaint: Patient was seen in consultation today for a image guided tunneled central venous catheter with port placement.   at the request of Chang,Rubens C  Referring Physician(s): Chang,Rubens C  Supervising Physician: Oley Balm  Patient Status: [facility] - Out-pt  Full Code  History of Present Illness: Diane Paul is a 82 y.o. female with history of anemia, anxiety, arthritis, breast cancer, GERD, HTN, and renal cell carcinoma-post right nephrectomy with lung metastases. Patient is currently being followed by [name], MD. Patient's last visit was 04/25/2023. Patient's provider ordered a port placement to assist with cancer treatment.   Patient reports having an occasional cough, which is productive. Patient also reports pain in the lateral portions of the abdomen, more superior. This began Saturday (3 days ago) after moving in the bed. Patient reports this pain has not increased in severity since onset. Patient denies new fever, chills, chest pain, new/worsening shortness of breath, abdominal pain, nausea, vomiting or diarrhea.   Past Medical History:  Diagnosis Date   Anemia    Anxiety    nervous sometimes, denies panica ttacks    Arthritis    knees ?   Blood transfusion    post vaginal birth- 1960, 01/2023- anemia   Breast cancer (HCC)    Right Breast Cancer   Cancer (HCC)    R breast cancer   Dry mouth    GERD (gastroesophageal reflux disease)    Hypertension    Neuromuscular disorder (HCC)    L leg, thigh- "burns sometimes"   Shortness of breath    sometimes    Use of letrozole (Femara) 05/07/2010   neoadjuvant femara therapy since 1/212    Past Surgical History:  Procedure Laterality Date   ABDOMINAL HYSTERECTOMY     BIOPSY  02/03/2023   Procedure: BIOPSY;  Surgeon: Jeani Hawking, MD;  Location: [facility] ENDOSCOPY;  Service: Gastroenterology;;  gastric   BREAST LUMPECTOMY     BREAST SURGERY     Right   BRONCHIAL BIOPSY  02/12/2023    Procedure: BRONCHIAL BIOPSIES;  Surgeon: Leslye Peer, MD;  Location: [facility] ENDOSCOPY;  Service: Pulmonary;;   BRONCHIAL BIOPSY  03/11/2023   Procedure: BRONCHIAL BIOPSIES;  Surgeon: Leslye Peer, MD;  Location: [facility] ENDOSCOPY;  Service: Pulmonary;;   BRONCHIAL BRUSHINGS  02/12/2023   Procedure: BRONCHIAL BRUSHINGS;  Surgeon: Leslye Peer, MD;  Location: [facility] ENDOSCOPY;  Service: Pulmonary;;   BRONCHIAL BRUSHINGS  03/11/2023   Procedure: BRONCHIAL BRUSHINGS;  Surgeon: Leslye Peer, MD;  Location: [facility] ENDOSCOPY;  Service: Pulmonary;;   BRONCHIAL NEEDLE ASPIRATION BIOPSY  02/12/2023   Procedure: BRONCHIAL NEEDLE ASPIRATION BIOPSIES;  Surgeon: Leslye Peer, MD;  Location: [facility] ENDOSCOPY;  Service: Pulmonary;;   BRONCHIAL NEEDLE ASPIRATION BIOPSY  03/11/2023   Procedure: BRONCHIAL NEEDLE ASPIRATION BIOPSIES;  Surgeon: Leslye Peer, MD;  Location: [facility] ENDOSCOPY;  Service: Pulmonary;;   BRONCHIAL WASHINGS  02/12/2023   Procedure: BRONCHIAL WASHINGS;  Surgeon: Leslye Peer, MD;  Location: [facility] ENDOSCOPY;  Service: Pulmonary;;   COLONOSCOPY WITH PROPOFOL N/A 02/03/2023   Procedure: COLONOSCOPY WITH PROPOFOL;  Surgeon: Jeani Hawking, MD;  Location: [facility] ENDOSCOPY;  Service: Gastroenterology;  Laterality: N/A;   ESOPHAGOGASTRODUODENOSCOPY (EGD) WITH PROPOFOL N/A 02/03/2023   Procedure: ESOPHAGOGASTRODUODENOSCOPY (EGD) WITH PROPOFOL;  Surgeon: Jeani Hawking, MD;  Location: [facility] ENDOSCOPY;  Service: Gastroenterology;  Laterality: N/A;   HEMOSTASIS CLIP PLACEMENT  02/03/2023   Procedure: HEMOSTASIS CLIP PLACEMENT;  Surgeon: Jeani Hawking, MD;  Location: [facility] ENDOSCOPY;  Service:  Gastroenterology;;  ascending colon polyp   jp drains     s/p masectomy 06/11/11/ Dr. [name], 2 jp drains right chest wall   MASTECTOMY Right    malignant   MASTECTOMY W/ SENTINEL NODE BIOPSY  06/11/2011/Right BReast   Procedure: MASTECTOMY WITH SENTINEL LYMPH NODE BIOPSY;  Surgeon: Ernestene Mention, MD;  Location: [facility] OR;  Service:  General;  Laterality: Right;  Right total mastectomy and sentinel lymph node biopsy using lymphatic mapping and blue dye injection.   POLYPECTOMY  02/03/2023   Procedure: POLYPECTOMY;  Surgeon: Jeani Hawking, MD;  Location: [facility] ENDOSCOPY;  Service: Gastroenterology;;  hot snare polypectomy ascending colon   ROBOT ASSISTED LAPAROSCOPIC NEPHRECTOMY Right 04/03/2023   Procedure: XI ROBOTIC ASSISTED RIGHT LAPAROSCOPIC RADICAL NEPHRECTOMY;  Surgeon: Loletta Parish., MD;  Location: [facility] ORS;  Service: Urology;  Laterality: Right;  180 MINUTES   SUBMUCOSAL LIFTING INJECTION  02/03/2023   Procedure: SUBMUCOSAL LIFTING INJECTION;  Surgeon: Jeani Hawking, MD;  Location: [facility] ENDOSCOPY;  Service: Gastroenterology;;  ever lift 16cc ascending colon   TUBAL LIGATION     [place] FINE NEEDLE ASPIRATION WO/ W SMEAR  05/22/10   Left Breast- cyst or abscess     Allergies: Fosamax [alendronate sodium]  Medications: Prior to Admission medications   Medication Sig Start Date End Date Taking? Authorizing Provider  amLODipine (NORVASC) 5 MG tablet Take 5 mg by mouth daily. Patient not taking: Reported on 04/08/2023    [provider]  amoxicillin (AMOXIL) 875 MG tablet Take 875 mg by mouth 2 (two) times daily. Patient not taking: Reported on 04/08/2023    [provider]  dronabinol (MARINOL) 5 MG capsule Take 1 capsule (5 mg total) by mouth 2 (two) times daily before a meal. 04/25/23   [name], MD  Ensure (ENSURE) Take 237 mLs by mouth 2 (two) times daily between meals.    [provider]  HYDROcodone-acetaminophen (NORCO) 5-325 MG tablet Take 1 tablet by mouth every 6 (six) hours as needed for moderate pain (pain score 4-6) or severe pain (pain score 7-10). 04/23/23   Donita Brooks, MD  lenvatinib 10 mg daily dose (LENVIMA) capsule Take 1 capsule (10 mg total) by mouth daily. 04/24/23   [name], MD  ondansetron (ZOFRAN) 8 MG tablet Take 1 tablet (8 mg total) by mouth every 8  (eight) hours as needed for nausea or vomiting. 04/23/23   [name], MD  oxyCODONE-acetaminophen (PERCOCET) 5-325 MG tablet Take 1-2 tablets by mouth every 6 (six) hours as needed for moderate pain (pain score 4-6). Patient not taking: Reported on 04/23/2023 04/19/23   Arby Barrette, MD  pantoprazole (PROTONIX) 40 MG tablet Take 1 tablet (40 mg total) by mouth 2 (two) times daily. Patient not taking: Reported on 03/26/2023 02/06/23 03/08/23  Lanae Boast, MD  prochlorperazine (COMPAZINE) 10 MG tablet Take 1 tablet (10 mg total) by mouth every 6 (six) hours as needed for nausea or vomiting. 04/23/23   [name], MD  senna-docusate (SENOKOT-S) 8.6-50 MG tablet Take 1 tablet by mouth 2 (two) times daily. While taking strong pain meds to prevent constipation 04/03/23   Berneice Heinrich Delbert Phenix., MD  traMADol (ULTRAM) 50 MG tablet Take 1 tablet (50 mg total) by mouth every 6 (six) hours as needed. Patient not taking: Reported on 04/23/2023 04/07/23 04/06/24  Noel Christmas, MD     Family History  Problem Relation Age of Onset   Breast cancer Sister  Anesthesia problems Neg Hx    Hypotension Neg Hx    Malignant hyperthermia Neg Hx    Pseudochol deficiency Neg Hx     Social History   Socioeconomic History   Marital status: Single    Spouse name: Not on file   Number of children: 5   Years of education: Not on file   Highest education level: Not on file  Occupational History   Occupation: Retired  Tobacco Use   Smoking status: Never   Smokeless tobacco: Never  Vaping Use   Vaping status: Never Used  Substance and Sexual Activity   Alcohol use: No   Drug use: No   Sexual activity: Not Currently    Birth control/protection: Post-menopausal  Other Topics Concern   Not on file  Social History Narrative   Not on file   Social Drivers of Health   Financial Resource Strain: Low Risk  (03/25/2023)   Overall Financial Resource Strain (CARDIA)    Difficulty of Paying Living  Expenses: Not hard at all  Food Insecurity: No Food Insecurity (04/08/2023)   Hunger Vital Sign    Worried About Running Out of Food in the Last Year: Never true    Ran Out of Food in the Last Year: Never true  Transportation Needs: No Transportation Needs (04/08/2023)   PRAPARE - Administrator, Civil Service (Medical): No    Lack of Transportation (Non-Medical): No  Physical Activity: Sufficiently Active (07/25/2022)   Exercise Vital Sign    Days of Exercise per Week: 7 days    Minutes of Exercise per Session: 150+ min  Stress: No Stress Concern Present (07/25/2022)   [employer] of Occupational Health - Occupational Stress Questionnaire    Feeling of Stress : Not at all  Social Connections: Socially Isolated (07/25/2022)   Social Connection and Isolation Panel [NHANES]    Frequency of Communication with Friends and Family: More than three times a week    Frequency of Social Gatherings with Friends and Family: More than three times a week    Attends Religious Services: Never    Database administrator or Organizations: No    Attends Banker Meetings: Never    Marital Status: Divorced     Review of Systems: A 12 point ROS discussed and pertinent positives are indicated in the HPI above.  All other systems are negative.  Review of Systems  Constitutional:  Negative for fever.  Respiratory:  Positive for cough. Negative for shortness of breath.   Cardiovascular:  Negative for chest pain.  Gastrointestinal:  Negative for abdominal pain, diarrhea, nausea and vomiting.  Psychiatric/Behavioral:  Negative for confusion.     Vital Signs: BP 126/75   Pulse (!) 106   Temp 98 F (36.7 C) (Oral)   Resp 16   Ht 5\' 7"  (1.702 m)   Wt 175 lb 7.8 oz (79.6 kg)   SpO2 98%   BMI 27.49 kg/m   Advance Care Plan: The advanced care plan/surrogate decision maker was discussed at the time of visit and documented in the medical record.    Physical Exam HENT:      Head: Normocephalic.     Mouth/Throat:     Mouth: Mucous membranes are moist.  Cardiovascular:     Rate and Rhythm: Tachycardia present.     Heart sounds: No murmur heard. Pulmonary:     Effort: Pulmonary effort is normal.     Breath sounds: Normal breath sounds.  Abdominal:  Palpations: Abdomen is soft.     Tenderness: There is abdominal tenderness.       Comments: Mild TTP noted in the areas above. Otherwise, abdomen is soft and non TTP.   Skin:    General: Skin is warm.  Neurological:     Mental Status: She is alert and oriented to person, place, and time.  Psychiatric:        Thought Content: Thought content normal.        Judgment: Judgment normal.     Imaging: ECHOCARDIOGRAM COMPLETE Result Date: 04/26/2023    ECHOCARDIOGRAM REPORT   Patient Name:   Diane Paul Date of Exam: 04/26/2023 Medical Rec #:  [id number]       Height:       67.0 in Accession #:    [id number]      Weight:       168.9 lb Date of Birth:  12-03-1940       BSA:          1.882 m Patient Age:    82 years        BP:           112/74 mmHg Patient Gender: F               HR:           101 bpm. Exam Location:  Outpatient Procedure: 2D Echo, 3D Echo, Color Doppler, Cardiac Doppler and Strain Analysis Indications:    Chemo  History:        Patient has no prior history of Echocardiogram examinations.  Sonographer:    Harriette Bouillon RDCS Referring Phys: [id number] RUBENS C CHANG IMPRESSIONS  1. Low normal EF with abnormal strain Suggest f/u MRI/TTE in 3 months.  2. Abnormal septal motion . Left ventricular ejection fraction, by estimation, is 50 to 55%. The left ventricle has low normal function. The left ventricle has no regional wall motion abnormalities. Left ventricular diastolic parameters are consistent with Grade I diastolic dysfunction (impaired relaxation). The average left ventricular global longitudinal strain is -11.3 %. The global longitudinal strain is abnormal.  3. Right ventricular systolic function is  normal. The right ventricular size is normal.  4. The mitral valve is normal in structure. No evidence of mitral valve regurgitation. No evidence of mitral stenosis.  5. The aortic valve was not well visualized. Aortic valve regurgitation is not visualized. No aortic stenosis is present.  6. The inferior vena cava is normal in size with greater than 50% respiratory variability, suggesting right atrial pressure of 3 mmHg. FINDINGS  Left Ventricle: Abnormal septal motion. Left ventricular ejection fraction, by estimation, is 50 to 55%. The left ventricle has low normal function. The left ventricle has no regional wall motion abnormalities. The average left ventricular global longitudinal strain is -11.3 %. The global longitudinal strain is abnormal. The left ventricular internal cavity size was normal in size. There is no left ventricular hypertrophy. Left ventricular diastolic parameters are consistent with Grade I diastolic dysfunction (impaired relaxation). Right Ventricle: The right ventricular size is normal. No increase in right ventricular wall thickness. Right ventricular systolic function is normal. Left Atrium: Left atrial size was normal in size. Right Atrium: Right atrial size was normal in size. Pericardium: There is no evidence of pericardial effusion. Mitral Valve: The mitral valve is normal in structure. No evidence of mitral valve regurgitation. No evidence of mitral valve stenosis. Tricuspid Valve: The tricuspid valve is normal in structure. Tricuspid valve regurgitation is not  demonstrated. No evidence of tricuspid stenosis. Aortic Valve: The aortic valve was not well visualized. Aortic valve regurgitation is not visualized. No aortic stenosis is present. Pulmonic Valve: The pulmonic valve was normal in structure. Pulmonic valve regurgitation is not visualized. No evidence of pulmonic stenosis. Aorta: The aortic root is normal in size and structure. Venous: The inferior vena cava is normal in size  with greater than 50% respiratory variability, suggesting right atrial pressure of 3 mmHg. IAS/Shunts: No atrial level shunt detected by color flow Doppler. Additional Comments: Low normal EF with abnormal strain Suggest f/u MRI/TTE in 3 months.  LEFT VENTRICLE PLAX 2D LVIDd:         3.80 cm      Diastology LVIDs:         3.20 cm      LV e' medial:    7.51 cm/s LV PW:         1.00 cm      LV E/e' medial:  9.5 LV IVS:        0.90 cm      LV e' lateral:   14.80 cm/s LVOT diam:     2.10 cm      LV E/e' lateral: 4.8 LV SV:         43 LV SV Index:   23           2D Longitudinal Strain LVOT Area:     3.46 cm     2D Strain GLS Avg:     -11.3 %  LV Volumes (MOD) LV vol d, MOD A2C: 99.5 ml LV vol d, MOD A4C: 101.0 ml LV vol s, MOD A2C: 55.2 ml LV vol s, MOD A4C: 57.2 ml LV SV MOD A2C:     44.3 ml LV SV MOD A4C:     101.0 ml LV SV MOD BP:      43.4 ml RIGHT VENTRICLE             IVC RV S prime:     14.00 cm/s  IVC diam: 1.20 cm TAPSE (M-mode): 1.7 cm LEFT ATRIUM             Index        RIGHT ATRIUM          Index LA diam:        3.10 cm 1.65 cm/m   RA Area:     9.85 cm LA Vol (A2C):   43.1 ml 22.90 ml/m  RA Volume:   16.00 ml 8.50 ml/m LA Vol (A4C):   31.4 ml 16.68 ml/m LA Biplane Vol: 39.9 ml 21.20 ml/m  AORTIC VALVE LVOT Vmax:   86.70 cm/s LVOT Vmean:  58.300 cm/s LVOT VTI:    0.125 m  AORTA Ao Root diam: 3.10 cm Ao Asc diam:  3.30 cm MITRAL VALVE MV Area (PHT): 5.70 cm     SHUNTS MV Decel Time: 133 msec     Systemic VTI:  0.12 m MV E velocity: 71.60 cm/s   Systemic Diam: 2.10 cm MV A velocity: 137.00 cm/s MV E/A ratio:  0.52 Charlton Haws MD Electronically signed by Charlton Haws MD Signature Date/Time: 04/26/2023/12:01:54 PM    Final    CT Angio Chest PE W/Cm &/Or Wo Cm Result Date: 04/18/2023 CLINICAL DATA:  Patient underwent laparoscopic right nephrectomy for clear cell carcinoma 04/03/2023, presents with postoperative abdominal pain, flank and thoracic pain. She also had a right mastectomy in 2013 for  carcinoma. EXAM: CT ANGIOGRAPHY CHEST CT  ABDOMEN AND PELVIS WITH CONTRAST TECHNIQUE: Multidetector CT imaging of the chest was performed using the standard protocol during bolus administration of intravenous contrast. Multiplanar CT image reconstructions and MIPs were obtained to evaluate the vascular anatomy. Multidetector CT imaging of the abdomen and pelvis was performed using the standard protocol during bolus administration of intravenous contrast. RADIATION DOSE REDUCTION: This exam was performed according to the departmental dose-optimization program which includes automated exposure control, adjustment of the mA and/or kV according to patient size and/or use of iterative reconstruction technique. CONTRAST:  60mL OMNIPAQUE IOHEXOL 350 MG/ML SOLN COMPARISON:  Portable chest today, chest radiograph 03/11/2023, PET-CT 02/26/2023, chest CT with contrast 02/01/2023, and CTA abdomen and pelvis also done 02/01/2023. FINDINGS: CTA CHEST FINDINGS Cardiovascular: The pulmonary trunk slightly prominent 2.8 cm suggesting arterial hypertension or fluid overload. No findings of acute right heart strain. There is no visible arterial embolus. There is atherosclerosis with mild scattered calcific plaque of the aorta and great vessels. There is no aneurysm, stenosis or dissection. Mild aortic tortuosity. The cardiac size is normal. No pericardial effusion. There is no visible coronary calcification. No venous dilatation. Mediastinum/Nodes: Interval increased prominence of AP window lymph nodes, largest is 10 mm short axis on 2:46. Previously was 5 mm. Interval increased prominence of subcarinal lymph nodes up to 1.2 cm in short axis on 2:61 with a few bilateral borderline prominent hilar nodes. There is no bulky or encasing adenopathy, no axillary adenopathy. Stable 1.5 cm heterogeneous right thyroid nodule is again noted with dystrophic calcifications. This has not changed in size since the chest CT from January 2012  therefore not requiring follow-up. The thoracic trachea, the bilateral main bronchi, thoracic esophagus unremarkable. There is a tiny hiatal hernia. Lungs/Pleura: Progressive metastatic lung disease since the 02/26/2023 PET-CT. Most of the metastases show varying degrees of central necrosis. There is a new paramediastinal mass in the right upper lobe measuring 2.7 x 2.8 cm on 3:24. There is a new lobular pleural based mass posteriorly in the right upper lobe measuring 3.7 x 3.9 cm on 3:31. Above this an interval enlarged mass measuring 2.1 x 1.7 cm on 3:24, previously 1.9 x 1.3 cm. Elsewhere, there are new nodules in the base of the right upper lobe measuring 1 cm and 0.5 cm both on 3:46; undo right middle lobe metastasis measuring 1.2 cm on 3:38; enlarging right lower lobe nodule measuring 1.4 cm on 3:83 which was only 5 mm previously; enlarging left lower lobe nodule measuring 2.6 x 1.3 cm on 3:74, previously 1.1 x 0.7 cm; enlarging left upper lobe nodule measuring 1.8 x 1.6 cm on 3:53, previously was 1.3 x 1 cm; and several other additional small new nodules scattered within the left upper and both lower lobes. There is linear atelectasis in the lung bases without focal consolidation. Mild bronchial thickening in both lower lobes. There are trace pleural effusions.  No pneumothorax. Musculoskeletal: No destructive bone lesions are seen, but there is a new nondisplaced fracture of the lateral right eighth rib which could be pathologic as there is lucency surrounding the fracture which was not seen previously. Review of the MIP images confirms the above findings. CT ABDOMEN and PELVIS FINDINGS Hepatobiliary: Gallbladder dilatation is similar to prior studies. The gallbladder is packed with stones, largest is 3.6 cm. No wall thickening or biliary dilatation is seen. There is no mass enhancement of the liver. Pancreas: Partially atrophic.  Otherwise unremarkable. Spleen: No abnormality. Adrenals/Urinary Tract: No  adrenal mass. There are Bosniak 1  cysts of the left kidney, 2.1 cm in the lower pole and 1.4 cm midpole. Interval right nephrectomy. There is an elongate fluid collection in Morrison's pouch just below the level of the right adrenal gland measuring 6.4 x 1.8 cm and 12.5 Hounsfield units which is most likely a seroma. There is no space-occupying hematoma, no abscess is seen. No mass or adenopathy is evident in the right nephrectomy bed. No urinary stone or obstruction. There are low ureteral insertions consistent with pelvic floor laxity and a small cystocele again noted. The bladder is unremarkable. Stomach/Bowel: No dilatation or wall thickening. Normal appendix. Uncomplicated sigmoid diverticulosis. Endoscopy clips again noted in the ascending colon. Uncomplicated sigmoid diverticulosis. Vascular/Lymphatic: Aortic atherosclerosis. No enlarged abdominal or pelvic lymph nodes. Reproductive: Status post hysterectomy. No adnexal masses. Other: There is a heterogeneous low-density subcutaneous collection in the right upper quadrant, ranging 07/25/2023 Hounsfield units and measuring 7.5 x 3.7 x 10.5 cm in total. This is probably a hematoma related to 1 of the trocar insertion sites. Infectious process is not excluded. There is patchy soft tissue gas overlying the anterior wall on both sides of the abdomen beginning just below the ribcage on the right and continuing down over the low anterior pelvic wall bilaterally, where there is more moderate patchy subcutaneous air. This is not associated with any fluid collection and is probably related to the recent nephrectomy. Correlate clinically for infectious process. There is no pelvic ascites. There is no free hemorrhage or free air. No incarcerated hernia. Musculoskeletal: There is an ill-defined lytic lesion, most likely a metastasis, in the posterior left ilium measuring 2.9 cm on 5:70, which was hypermetabolic on the PET-CT. There is no other visible bone metastasis.  There are degenerative changes of the spine. Review of the MIP images confirms the above findings. IMPRESSION: 1. No visible arterial embolus. 2. Prominent pulmonary trunk suggesting arterial hypertension or fluid overload. No findings of acute right heart strain. 3. Progressive metastatic lung disease with both new and interval enlarging masses. 4. New nondisplaced fracture of the lateral right eighth rib which could be pathologic as there is lucency surrounding the fracture which was not seen previously. 5. Trace pleural effusions. 6. Interval right nephrectomy with 6.4 x 1.8 cm fluid collection in Morrison's pouch just below the level of the right adrenal gland, most likely a seroma. No space-occupying hematoma, no abscess is seen. 7. 7.5 x 3.7 x 10.5 cm heterogeneous low-density subcutaneous collection in the right upper quadrant, probably a hematoma related to 1 of the trocar insertion sites. Infectious process is not excluded. 8. Patchy soft tissue gas overlying the anterior wall on both sides of the abdomen and pelvis, probably related to the recent nephrectomy. Correlate clinically for infectious process. 9. 2.9 cm lytic lesion in the posterior left ilium, most likely a metastasis, which was hypermetabolic on the PET-CT. No other visible bone metastasis at the level of the abdomen and pelvis. 10. Cholelithiasis with stable gallbladder dilatation. 11. Pelvic floor laxity with low ureteral insertions and a small cystocele. 12. Diverticulosis without evidence of diverticulitis. 13. Aortic atherosclerosis. Aortic Atherosclerosis (ICD10-I70.0). Electronically Signed   By: Almira Bar M.D.   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 She also had a right mastectomy in 2013 for carcinoma. EXAM: CT  ANGIOGRAPHY CHEST CT ABDOMEN AND PELVIS WITH CONTRAST TECHNIQUE:  Multidetector CT imaging of the chest was performed using the standard protocol during bolus administration of intravenous contrast. Multiplanar CT image reconstructions and MIPs were obtained to evaluate the vascular anatomy. Multidetector CT imaging of the abdomen and pelvis was performed using the standard protocol during bolus administration of intravenous contrast. RADIATION DOSE REDUCTION: This exam was performed according to the departmental dose-optimization program which includes automated exposure control, adjustment of the mA and/or kV according to patient size and/or use of iterative reconstruction technique. CONTRAST:  60mL OMNIPAQUE IOHEXOL 350 MG/ML SOLN COMPARISON:  Portable chest today, chest radiograph 03/11/2023, PET-CT 02/26/2023, chest CT with contrast 02/01/2023, and CTA abdomen and pelvis also done 02/01/2023. FINDINGS: CTA CHEST FINDINGS Cardiovascular: The pulmonary trunk slightly prominent 2.8 cm suggesting arterial hypertension or fluid overload. No findings of acute right heart strain. There is no visible arterial embolus. There is atherosclerosis with mild scattered calcific plaque of the aorta and great vessels. There is no aneurysm, stenosis or dissection. Mild aortic tortuosity. The cardiac size is normal. No pericardial effusion. There is no visible coronary calcification. No venous dilatation. Mediastinum/Nodes: Interval increased prominence of AP window lymph nodes, largest is 10 mm short axis on 2:46. Previously was 5 mm. Interval increased prominence of subcarinal lymph nodes up to 1.2 cm in short axis on 2:61 with a few bilateral borderline prominent hilar nodes. There is no bulky or encasing adenopathy, no axillary adenopathy. Stable 1.5 cm heterogeneous right thyroid nodule is again noted with dystrophic calcifications. This has not changed in size since the chest CT from January 2012 therefore not requiring  follow-up. The thoracic trachea, the bilateral main bronchi, thoracic esophagus unremarkable. There is a tiny hiatal hernia. Lungs/Pleura: Progressive metastatic lung disease since the 02/26/2023 PET-CT. Most of the metastases show varying degrees of central necrosis. There is a new paramediastinal mass in the right upper lobe measuring 2.7 x 2.8 cm on 3:24. There is a new lobular pleural based mass posteriorly in the right upper lobe measuring 3.7 x 3.9 cm on 3:31. Above this an interval enlarged mass measuring 2.1 x 1.7 cm on 3:24, previously 1.9 x 1.3 cm. Elsewhere, there are new nodules in the base of the right upper lobe measuring 1 cm and 0.5 cm both on 3:46; undo right middle lobe metastasis measuring 1.2 cm on 3:38; enlarging right lower lobe nodule measuring 1.4 cm on 3:83 which was only 5 mm previously; enlarging left lower lobe nodule measuring 2.6 x 1.3 cm on 3:74, previously 1.1 x 0.7 cm; enlarging left upper lobe nodule measuring 1.8 x 1.6 cm on 3:53, previously was 1.3 x 1 cm; and several other additional small new nodules scattered within the left upper and both lower lobes. There is linear atelectasis in the lung bases without focal consolidation. Mild bronchial thickening in both lower lobes. There are trace pleural effusions.  No pneumothorax. Musculoskeletal: No destructive bone lesions are seen, but there is a new nondisplaced fracture of the lateral right eighth rib which could be pathologic as there is lucency surrounding the fracture which was not seen previously. Review of the MIP images confirms the above findings. CT ABDOMEN and PELVIS FINDINGS Hepatobiliary: Gallbladder dilatation is similar to prior studies. The gallbladder is packed with stones, largest is 3.6 cm. No wall thickening or biliary dilatation is seen. There is no mass enhancement of the liver. Pancreas: Partially atrophic.  Otherwise unremarkable. Spleen: No abnormality. Adrenals/Urinary Tract: No adrenal mass. There are  Bosniak 1 cysts of the left kidney, 2.1  cm in the lower pole and 1.4 cm midpole. Interval right nephrectomy. There is an elongate fluid collection in Morrison's pouch just below the level of the right adrenal gland measuring 6.4 x 1.8 cm and 12.5 Hounsfield units which is most likely a seroma. There is no space-occupying hematoma, no abscess is seen. No mass or adenopathy is evident in the right nephrectomy bed. No urinary stone or obstruction. There are low ureteral insertions consistent with pelvic floor laxity and a small cystocele again noted. The bladder is unremarkable. Stomach/Bowel: No dilatation or wall thickening. Normal appendix. Uncomplicated sigmoid diverticulosis. Endoscopy clips again noted in the ascending colon. Uncomplicated sigmoid diverticulosis. Vascular/Lymphatic: Aortic atherosclerosis. No enlarged abdominal or pelvic lymph nodes. Reproductive: Status post hysterectomy. No adnexal masses. Other: There is a heterogeneous low-density subcutaneous collection in the right upper quadrant, ranging 07/25/2023 Hounsfield units and measuring 7.5 x 3.7 x 10.5 cm in total. This is probably a hematoma related to 1 of the trocar insertion sites. Infectious process is not excluded. There is patchy soft tissue gas overlying the anterior wall on both sides of the abdomen beginning just below the ribcage on the right and continuing down over the low anterior pelvic wall bilaterally, where there is more moderate patchy subcutaneous air. This is not associated with any fluid collection and is probably related to the recent nephrectomy. Correlate clinically for infectious process. There is no pelvic ascites. There is no free hemorrhage or free air. No incarcerated hernia. Musculoskeletal: There is an ill-defined lytic lesion, most likely a metastasis, in the posterior left ilium measuring 2.9 cm on 5:70, which was hypermetabolic on the PET-CT. There is no other visible bone metastasis. There are degenerative  changes of the spine. Review of the MIP images confirms the above findings. IMPRESSION: 1. No visible arterial embolus. 2. Prominent pulmonary trunk suggesting arterial hypertension or fluid overload. No findings of acute right heart strain. 3. Progressive metastatic lung disease with both new and interval enlarging masses. 4. New nondisplaced fracture of the lateral right eighth rib which could be pathologic as there is lucency surrounding the fracture which was not seen previously. 5. Trace pleural effusions. 6. Interval right nephrectomy with 6.4 x 1.8 cm fluid collection in Morrison's pouch just below the level of the right adrenal gland, most likely a seroma. No space-occupying hematoma, no abscess is seen. 7. 7.5 x 3.7 x 10.5 cm heterogeneous low-density subcutaneous collection in the right upper quadrant, probably a hematoma related to 1 of the trocar insertion sites. Infectious process is not excluded. 8. Patchy soft tissue gas overlying the anterior wall on both sides of the abdomen and pelvis, probably related to the recent nephrectomy. Correlate clinically for infectious process. 9. 2.9 cm lytic lesion in the posterior left ilium, most likely a metastasis, which was hypermetabolic on the PET-CT. No other visible bone metastasis at the level of the abdomen and pelvis. 10. Cholelithiasis with stable gallbladder dilatation. 11. Pelvic floor laxity with low ureteral insertions and a small cystocele. 12. Diverticulosis without evidence of diverticulitis. 13. Aortic atherosclerosis. Aortic Atherosclerosis (ICD10-I70.0). Electronically Signed   By: Almira Bar M.D.   On: 04/18/2023 23:38   CT CHEST LIMITED WO CONTRAST Result Date: 04/18/2023 CLINICAL DATA:  82 year old female presenting for lung mass biopsy. EXAM: CT CHEST WITHOUT CONTRAST TECHNIQUE: Multidetector CT imaging of the chest was performed following the standard protocol without IV contrast. RADIATION DOSE REDUCTION: This exam was performed  according to the departmental dose-optimization program which includes automated exposure control,  adjustment of the mA and/or kV according to patient size and/or use of iterative reconstruction technique. COMPARISON:  PET-CT from 02/26/2023, CT chest from 02/01/2023 FINDINGS: Cardiovascular: Mild scattered atherosclerotic calcifications about the aortic arch. Normal heart size. No pericardial effusion. Mediastinum/Nodes: There are few small scattered mediastinal and hilar lymph nodes, similar to comparison. Unchanged peripherally calcified right thyroid nodule. Lungs/Pleura: Small right apical pneumothorax. Interval progression of multifocal bilateral solid pulmonary nodules, the largest measuring up to 3.9 cm in the right upper lobe, previously approximally 1.8 cm on PET-CT. Interval development of trace right pleural effusion. Upper Abdomen: No acute abnormality. Musculoskeletal: No chest wall mass or suspicious bone lesions identified. IMPRESSION: 1. Small right apical pneumothorax. 2. Interval progression of multifocal bilateral solid pulmonary nodules, suggestive of progressing metastatic disease. 3. Interval development of trace right pleural effusion. 4. Artic atherosclerosis (ICD10-I70.0). These results, in addition to cancellation of planned biopsy, were called by telephone on 04/10/2023 to provider [facility] , who verbally acknowledged these results. Marliss Coots, MD [facility] Radiology Electronically Signed   By: Marliss Coots M.D.   On: 04/18/2023 15:42   DG Chest Portable 1 View Result Date: 04/18/2023 CLINICAL DATA:  Right flank pain. Cough. History of renal cell carcinoma with pulmonary metastatic disease. EXAM: PORTABLE CHEST 1 VIEW COMPARISON:  Radiographs 03/11/2023 and 02/12/2023. PET-CT 02/26/2023. FINDINGS: 1435 hours. Interval improved aeration of both lungs with decreased bilateral pulmonary opacities since 03/11/2023. There is mild  residual left lower lobe atelectasis. Multiple pulmonary nodules are again noted which appear progressive. There is no pleural effusion or pneumothorax. The heart size and mediastinal contours are stable. IMPRESSION: 1. Interval improved aeration of both lungs with decreased bilateral pulmonary opacities since 03/11/2023. 2. Progressive pulmonary metastatic disease. 3. No definite acute findings. Electronically Signed   By: Carey Bullocks M.D.   On: 04/18/2023 15:32    Labs:  CBC: Recent Labs    04/10/23 0629 04/11/23 1151 04/18/23 1625 04/25/23 1445  WBC 15.3* 14.0* 4.9 13.4*  HGB 10.5* 10.6* 19.3* 9.7*  HCT 36.3 35.3* 64.1* 31.7*  PLT 518* 515* 166 485*    COAGS: Recent Labs    02/02/23 0941 04/10/23 0629  INR 1.6* 1.3*    BMP: Recent Labs    04/06/23 1710 04/11/23 1151 04/18/23 1625 04/25/23 1445  NA 134* 138 135 132*  K 4.4 4.8 5.0 4.9  CL 101 100 99 96*  CO2 24 31 25 27   GLUCOSE 119* 97 110* 99  BUN 17 16 19 18   CALCIUM 8.1* 8.8* 9.0 9.4  CREATININE 1.22* 1.31* 1.28* 1.15*  GFRNONAA 44* 41* 42* 48*    LIVER FUNCTION TESTS: Recent Labs    02/01/23 1515 02/15/23 1152 04/11/23 1151 04/18/23 1625 04/25/23 1445  BILITOT 0.7 0.4 0.4 0.2 0.5  AST 19 18 14* 14* 8*  ALT 20 26 14 9 7   ALKPHOS 80  --  101 76 102  PROT 7.1 6.4 6.9 7.1 7.7  ALBUMIN 1.9*  --  3.0* 2.2* 3.3*    TUMOR MARKERS: No results for input(s): "AFPTM", "CEA", "CA199", "CHROMGRNA" in the last 8760 hours.  Assessment and Plan:  Patient is scheduled today for a image guided tunneled central venous catheter with port placement.   Risks and benefits of image guided port-a-catheter placement was discussed with the patient including, but not limited to bleeding, infection, pneumothorax, or fibrin sheath development and need for additional procedures.  All of the patient's questions were answered, patient is  agreeable to proceed. Consent signed and in chart.  Thank you for this  interesting consult.  I greatly enjoyed meeting Diane Paul and look forward to participating in their care.  A copy of this report was sent to the requesting provider on this date.  Electronically Signed: Rosalita Levan, PA 04/30/2023, 9:17 AM   I spent a total of  15 Minutes   in face to face in clinical consultation, greater than 50% of which was counseling/coordinating care for image guided tunneled central venous catheter with port placement.

## 2023-04-30 ENCOUNTER — Other Ambulatory Visit: Payer: Self-pay

## 2023-04-30 ENCOUNTER — Encounter (HOSPITAL_COMMUNITY): Payer: Self-pay

## 2023-04-30 ENCOUNTER — Inpatient Hospital Stay (HOSPITAL_COMMUNITY)
Admission: RE | Admit: 2023-04-30 | Discharge: 2023-04-30 | Discharge status: Home / Self Care | Payer: Medicare HMO | Source: Ambulatory Visit

## 2023-04-30 ENCOUNTER — Other Ambulatory Visit (HOSPITAL_COMMUNITY): Payer: Self-pay

## 2023-04-30 ENCOUNTER — Ambulatory Visit (HOSPITAL_COMMUNITY)
Admission: RE | Admit: 2023-04-30 | Discharge: 2023-04-30 | Disposition: A | Discharge status: Home / Self Care | Payer: Medicare HMO | Source: Ambulatory Visit

## 2023-04-30 DIAGNOSIS — I10 Essential (primary) hypertension: Secondary | ICD-10-CM | POA: Diagnosis not present

## 2023-04-30 DIAGNOSIS — F419 Anxiety disorder, unspecified: Secondary | ICD-10-CM | POA: Insufficient documentation

## 2023-04-30 DIAGNOSIS — Z85528 Personal history of other malignant neoplasm of kidney: Secondary | ICD-10-CM | POA: Insufficient documentation

## 2023-04-30 DIAGNOSIS — K219 Gastro-esophageal reflux disease without esophagitis: Secondary | ICD-10-CM | POA: Insufficient documentation

## 2023-04-30 DIAGNOSIS — C641 Malignant neoplasm of right kidney, except renal pelvis: Secondary | ICD-10-CM | POA: Diagnosis not present

## 2023-04-30 DIAGNOSIS — C7802 Secondary malignant neoplasm of left lung: Secondary | ICD-10-CM | POA: Insufficient documentation

## 2023-04-30 DIAGNOSIS — C7801 Secondary malignant neoplasm of right lung: Secondary | ICD-10-CM | POA: Insufficient documentation

## 2023-04-30 DIAGNOSIS — D649 Anemia, unspecified: Secondary | ICD-10-CM | POA: Diagnosis not present

## 2023-04-30 DIAGNOSIS — Z853 Personal history of malignant neoplasm of breast: Secondary | ICD-10-CM | POA: Insufficient documentation

## 2023-04-30 DIAGNOSIS — C50911 Malignant neoplasm of unspecified site of right female breast: Secondary | ICD-10-CM | POA: Diagnosis not present

## 2023-04-30 HISTORY — PX: IR IMAGING GUIDED PORT INSERTION: IMG5740

## 2023-04-30 MED ORDER — SODIUM CHLORIDE 0.9% FLUSH: 3.0000 mL | Freq: Two times a day (BID) | INTRAVENOUS | Status: DC

## 2023-04-30 MED ORDER — FENTANYL CITRATE (PF) 100 MCG/2ML IJ SOLN
INTRAMUSCULAR | Status: AC | PRN
  Administered 2023-04-30 (×2): 50 ug via INTRAVENOUS

## 2023-04-30 MED ORDER — LIDOCAINE-EPINEPHRINE 1 %-1:100000 IJ SOLN
INTRAMUSCULAR | Status: AC
  Filled 2023-04-30: qty 1

## 2023-04-30 MED ORDER — SODIUM CHLORIDE 0.9% FLUSH: 3.0000 mL | INTRAVENOUS | Status: DC | PRN

## 2023-04-30 MED ORDER — SODIUM CHLORIDE 0.9 % IV SOLN: INTRAVENOUS | Status: DC

## 2023-04-30 MED ORDER — HEPARIN SOD (PORK) LOCK FLUSH 100 UNIT/ML IV SOLN
INTRAVENOUS | Status: AC
  Filled 2023-04-30: qty 5

## 2023-04-30 MED ORDER — MIDAZOLAM HCL 2 MG/2ML IJ SOLN
INTRAMUSCULAR | Status: AC
  Filled 2023-04-30: qty 2

## 2023-04-30 MED ORDER — MIDAZOLAM HCL 2 MG/2ML IJ SOLN
INTRAMUSCULAR | Status: AC | PRN
  Administered 2023-04-30 (×2): 1 mg via INTRAVENOUS

## 2023-04-30 MED ORDER — HYDROCODONE-ACETAMINOPHEN 7.5-325 MG PO TABS: 1.0000 | ORAL_TABLET | Freq: Four times a day (QID) | ORAL | Status: DC | PRN

## 2023-04-30 MED ORDER — HEPARIN SOD (PORK) LOCK FLUSH 100 UNIT/ML IV SOLN
500.0000 [IU] | Freq: Once | INTRAVENOUS | Status: AC
  Administered 2023-04-30: 500 [IU] via INTRAVENOUS

## 2023-04-30 MED ORDER — LIDOCAINE-EPINEPHRINE 1 %-1:100000 IJ SOLN
20.0000 mL | Freq: Once | INTRAMUSCULAR | Status: AC
  Administered 2023-04-30: 20 mL via INTRADERMAL

## 2023-04-30 MED ORDER — FENTANYL CITRATE (PF) 100 MCG/2ML IJ SOLN
INTRAMUSCULAR | Status: AC
  Filled 2023-04-30: qty 2

## 2023-04-30 NOTE — Progress Notes (Signed)
1415 Discharged home to car by wheelchair with a staff member.

## 2023-04-30 NOTE — Discharge Instructions (Addendum)
Discharge Instructions:   Please call Interventional Radiology clinic [phone] with any questions or concerns.  You may remove your band from your left neck and dressing from your left chest and shower tomorrow.  Do not use EMLA / Lidocaine cream for 2 weeks post Port Insertion this will remove the surgical glue.   Moderate Conscious Sedation, Adult, Care After This sheet gives you information about how to care for yourself after your procedure. Your health care provider may also give you more specific instructions. If you have problems or questions, contact your health care provider. What can I expect after the procedure? After the procedure, it is common to have: Sleepiness for several hours. Impaired judgment for several hours. Difficulty with balance. Vomiting if you eat too soon. Follow these instructions at home: For the time period you were told by your health care provider: Rest. Do not participate in activities where you could fall or become injured. Do not drive or use machinery. Do not drink alcohol. Do not take sleeping pills or medicines that cause drowsiness. Do not make important decisions or sign legal documents. Do not take care of children on your own. Eating and drinking  Follow the diet recommended by your health care provider. Drink enough fluid to keep your urine pale yellow. If you vomit: Drink water, juice, or soup when you can drink without vomiting. Make sure you have little or no nausea before eating solid foods. General instructions Take over-the-counter and prescription medicines only as told by your health care provider. Have a responsible adult stay with you for the time you are told. It is important to have someone help care for you until you are awake and alert. Do not smoke. Keep all follow-up visits as told by your health care provider. This is important. Contact a health care provider if: You are still sleepy or having trouble with  balance after 24 hours. You feel light-headed. You keep feeling nauseous or you keep vomiting. You develop a rash. You have a fever. You have redness or swelling around the IV site. Get help right away if: You have trouble breathing. You have new-onset confusion at home. Summary After the procedure, it is common to feel sleepy, have impaired judgment, or feel nauseous if you eat too soon. Rest after you get home. Know the things you should not do after the procedure. Follow the diet recommended by your health care provider and drink enough fluid to keep your urine pale yellow. Get help right away if you have trouble breathing or new-onset confusion at home. This information is not intended to replace advice given to you by your health care provider. Make sure you discuss any questions you have with your health care provider. Document Revised: 08/21/2019 Document Reviewed: 03/19/2019 Elsevier Patient Education  2023 [employer]  Implanted [place] Insertion, Care After The following information offers guidance on how to care for yourself after your procedure. Your health care provider may also give you more specific instructions. If you have problems or questions, contact your health care provider. What can I expect after the procedure? After the procedure, it is common to have: Discomfort at the port insertion site. Bruising on the skin over the port. This should improve over 3-4 days. Follow these instructions at home: [facility] care After your port is placed, you will get a manufacturer's information card. The card has information about your port. Keep this card with you at all times. Take care of the port as told by your  health care provider. Ask your health care provider if you or a family member can get training for taking care of the port at home. A home health care nurse will be be available to help care for the port. Make sure to remember what type of port you have. Incision care      Follow instructions from your health care provider about how to take care of your port insertion site. Make sure you: Wash your hands with soap and water for at least 20 seconds before and after you change your bandage (dressing). If soap and water are not available, use hand sanitizer. Change your dressing as told by your health care provider. Leave stitches (sutures), skin glue, or adhesive strips in place. These skin closures may need to stay in place for 2 weeks or longer. If adhesive strip edges start to loosen and curl up, you may trim the loose edges. Do not remove adhesive strips completely unless your health care provider tells you to do that. Check your port insertion site every day for signs of infection. Check for: Redness, swelling, or pain. Fluid or blood. Warmth. Pus or a bad smell. Activity Return to your normal activities as told by your health care provider. Ask your health care provider what activities are safe for you. You may have to avoid lifting. Ask your health care provider how much you can safely lift. General instructions Take over-the-counter and prescription medicines only as told by your health care provider. Do not take baths, swim, or use a hot tub until your health care provider approves. Ask your health care provider if you may take showers. You may only be allowed to take sponge baths. If you were given a sedative during the procedure, it can affect you for several hours. Do not drive or operate machinery until your health care provider says that it is safe. Wear a medical alert bracelet in case of an emergency. This will tell any health care providers that you have a port. Keep all follow-up visits. This is important. Contact a health care provider if: You cannot flush your port with saline as directed, or you cannot draw blood from the port. You have a fever or chills. You have redness, swelling, or pain around your port insertion site. You have fluid or  blood coming from your port insertion site. Your port insertion site feels warm to the touch. You have pus or a bad smell coming from the port insertion site. Get help right away if: You have chest pain or shortness of breath. You have bleeding from your port that you cannot control. These symptoms may be an emergency. Get help right away. Call 911. Do not wait to see if the symptoms will go away. Do not drive yourself to the hospital. Summary Take care of the port as told by your health care provider. Keep the manufacturer's information card with you at all times. Change your dressing as told by your health care provider. Contact a health care provider if you have a fever or chills or if you have redness, swelling, or pain around your port insertion site. Keep all follow-up visits. This information is not intended to replace advice given to you by your health care provider. Make sure you discuss any questions you have with your health care provider. Document Revised: 10/25/2020 Document Reviewed: 10/25/2020 Elsevier Patient Education  2023 [employer].

## 2023-04-30 NOTE — Procedures (Signed)
  Procedure:  Left internal jugular port catheter   Preprocedure diagnosis: The encounter diagnosis was Malignant neoplasm metastatic to both lungs (HCC). Postprocedure diagnosis: same EBL:    minimal Complications:   none immediate  See full dictation in [employer].  Thora Lance MD Main # [phone] Pager  [phone] Mobile [phone]

## 2023-04-30 NOTE — Progress Notes (Signed)
1400 Ice bag given to use for comfort to left neck and left chest.

## 2023-05-01 ENCOUNTER — Other Ambulatory Visit: Payer: Self-pay

## 2023-05-02 ENCOUNTER — Telehealth: Payer: Self-pay

## 2023-05-02 NOTE — Telephone Encounter (Signed)
TC from pt's daughter, asking questions about pt's port-a-cath that was placed 12/24. She states that d/c instructions were to remove bandages after 24 hours, but she is wondering if something could be placed over sites "to keep clothing from rubbing up against site." Advised that it's best to leave open to air and for pt to wear loose-fitting clothing. But if she must wear something that irritates area, a thin, nonstick dressing could be placed over area w/ 3/4 edges left open to air--in this case, must change at least daily and monitor site for s/s of infection such as redness, swelling, drainage, and fever. Dgt also reports that pt is having some itching around the site, and she asks about putting anti-itch cream on it. Instructed not to put any type of cream on the area d/t the adhesive glue at site. Advised that pt can try to take OTC zyrtec or other antihistamine to help with itching if it becomes extremely bothersome. Dgt verbalizes understanding of all information and is aware to report any of the above s/s of infection.

## 2023-05-05 ENCOUNTER — Other Ambulatory Visit: Payer: Self-pay

## 2023-05-05 DIAGNOSIS — C641 Malignant neoplasm of right kidney, except renal pelvis: Secondary | ICD-10-CM

## 2023-05-06 ENCOUNTER — Inpatient Hospital Stay: Payer: Medicare HMO

## 2023-05-06 VITALS — BP 103/67 | HR 104 | Temp 97.2°F | Resp 17 | Wt 175.4 lb

## 2023-05-06 DIAGNOSIS — C641 Malignant neoplasm of right kidney, except renal pelvis: Secondary | ICD-10-CM | POA: Diagnosis not present

## 2023-05-06 DIAGNOSIS — D630 Anemia in neoplastic disease: Secondary | ICD-10-CM

## 2023-05-06 DIAGNOSIS — R630 Anorexia: Secondary | ICD-10-CM

## 2023-05-06 DIAGNOSIS — Z5112 Encounter for antineoplastic immunotherapy: Secondary | ICD-10-CM | POA: Diagnosis not present

## 2023-05-06 DIAGNOSIS — Z9189 Other specified personal risk factors, not elsewhere classified: Secondary | ICD-10-CM | POA: Diagnosis not present

## 2023-05-06 DIAGNOSIS — C7801 Secondary malignant neoplasm of right lung: Secondary | ICD-10-CM

## 2023-05-06 LAB — CBC WITH DIFFERENTIAL (CANCER CENTER ONLY)
Abs Immature Granulocytes: 0.17 10*3/uL — ABNORMAL HIGH (ref 0.00–0.07)
Basophils Absolute: 0 10*3/uL (ref 0.0–0.1)
Basophils Relative: 0 %
Eosinophils Absolute: 0.3 10*3/uL (ref 0.0–0.5)
Eosinophils Relative: 3 %
HCT: 30 % — ABNORMAL LOW (ref 36.0–46.0)
Hemoglobin: 9 g/dL — ABNORMAL LOW (ref 12.0–15.0)
Immature Granulocytes: 1 %
Lymphocytes Relative: 13 %
Lymphs Abs: 1.6 10*3/uL (ref 0.7–4.0)
MCH: 24.4 pg — ABNORMAL LOW (ref 26.0–34.0)
MCHC: 30 g/dL (ref 30.0–36.0)
MCV: 81.3 fL (ref 80.0–100.0)
Monocytes Absolute: 1.8 10*3/uL — ABNORMAL HIGH (ref 0.1–1.0)
Monocytes Relative: 14 %
Neutro Abs: 8.9 10*3/uL — ABNORMAL HIGH (ref 1.7–7.7)
Neutrophils Relative %: 69 %
Platelet Count: 336 10*3/uL (ref 150–400)
RBC: 3.69 MIL/uL — ABNORMAL LOW (ref 3.87–5.11)
RDW: 20.6 % — ABNORMAL HIGH (ref 11.5–15.5)
WBC Count: 12.8 10*3/uL — ABNORMAL HIGH (ref 4.0–10.5)
nRBC: 0 % (ref 0.0–0.2)

## 2023-05-06 LAB — COMPREHENSIVE METABOLIC PANEL
ALT: 10 U/L (ref 0–44)
AST: 9 U/L — ABNORMAL LOW (ref 15–41)
Albumin: 3 g/dL — ABNORMAL LOW (ref 3.5–5.0)
Alkaline Phosphatase: 102 U/L (ref 38–126)
Anion gap: 8 (ref 5–15)
BUN: 19 mg/dL (ref 8–23)
CO2: 30 mmol/L (ref 22–32)
Calcium: 9.3 mg/dL (ref 8.9–10.3)
Chloride: 97 mmol/L — ABNORMAL LOW (ref 98–111)
Creatinine, Ser: 1.07 mg/dL — ABNORMAL HIGH (ref 0.44–1.00)
GFR, Estimated: 52 mL/min — ABNORMAL LOW (ref >60)
Glucose, Bld: 114 mg/dL — ABNORMAL HIGH (ref 70–99)
Potassium: 4.6 mmol/L (ref 3.5–5.1)
Sodium: 135 mmol/L (ref 135–145)
Total Bilirubin: 0.4 mg/dL (ref 0.0–1.2)
Total Protein: 7.5 g/dL (ref 6.5–8.1)

## 2023-05-06 LAB — SAMPLE TO BLOOD BANK

## 2023-05-06 LAB — LACTATE DEHYDROGENASE: LDH: 285 U/L — ABNORMAL HIGH (ref 98–192)

## 2023-05-06 NOTE — Assessment & Plan Note (Addendum)
Will start lenvatinib on 12/31 given wound is healing well now Continue with pembrolizumab Will start lenvatinib at 10 mg daily for the first 3 weeks and reassess.

## 2023-05-06 NOTE — Assessment & Plan Note (Signed)
Monitor closely for any bleeding over the next few weeks. Repeat CBC with lab appointment before next visit.

## 2023-05-06 NOTE — Progress Notes (Signed)
Patient Care Team: Donita Brooks, MD as PCP - General (Family Medicine)  Clinic Day:  05/06/2023  Referring physician: Donita Brooks, MD  ASSESSMENT & PLAN:   Assessment & Plan: 82 y.o.w. 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   Diagnosis: stage IV clear cell RCC Treatment: Right nephrectomy.   Her wound is now healing well.  No signs of abdominal distention or open wound.  She also has progressing metastatic renal cell carcinoma aggressive in nature of grade 4 now with more pain related to metastases.  We discussed risk and benefit of starting TKI.  Will proceed with TKI and she will start lenvatinib tomorrow.  Discussed with son and daughter in the room today to continue to watch any new concerning symptoms.   Metastatic renal cell carcinoma, right (HCC) Will start lenvatinib on 12/31 given wound is healing well now Continue with pembrolizumab Will start lenvatinib at 10 mg daily for the first 3 weeks and reassess.  At risk for side effect of medication Drug monitoring for lenvatinib. Monitor for hypertension Baseline EKG normal QTc LFT, renal function, electrolytes, every 2 weeks for 2 months and then monthly thereafter Baseline TSH and free T4 and then monthly urine dipstick; if 2+ then obtain a 24-hour urine protein Dental exam before and periodically Monitor for signs of bleeding, wound healing, thrombosis Follow up on 12/30 after lab as scheduled.  Decreased appetite Continue Marinol 5 mg twice daily.  Refill Prescription sent to her pharmacy.  Anemia in neoplastic disease Monitor closely for any bleeding over the next few weeks. Repeat CBC with lab appointment before next visit.   Acute infarct Asymptomatic.  Discussed with patient, and family, continue to monitor at this time.  Not a candidate for anticoagulation due to increased risk of bleeding  about the start TKI.  Will return as scheduled on 1/9 with labs, and then see me again. The patient understands the plans discussed today and is in agreement with them.  She knows to contact our office if she develops concerns prior to her next appointment.  [name], MD  [facility] - A DEPT OF [facility] [address] [place] [address] Dept: [phone] Dept Fax: [phone]   No orders of the defined types were placed in this encounter.     CHIEF COMPLAINT:  CC: Renal cell carcinoma  Current Treatment: Pembrolizumab with lenvatinib  INTERVAL HISTORY:  Diane Paul is here today for repeat clinical assessment. She denies discharge from the wound.  No bleeding.  No bloody stool or dark stool.  Report her appetite has improved.  Report of no abdominal pain or distention.  Pain over the right lateral rib area.  I have reviewed the past medical history, past surgical history, social history and family history with the patient and they are unchanged from previous note.  ALLERGIES:  is allergic to fosamax [alendronate sodium].  MEDICATIONS:  Current Outpatient Medications  Medication Sig Dispense Refill   dronabinol (MARINOL) 5 MG capsule Take 1 capsule (5 mg total) by mouth 2 (two) times daily before a meal. 30 capsule 0   Ensure (ENSURE) Take 237 mLs by mouth 2 (two) times daily between meals.     HYDROcodone-acetaminophen (NORCO) 5-325 MG tablet Take 1 tablet by mouth every 6 (six) hours as needed for moderate  pain (pain score 4-6) or severe pain (pain score 7-10). 120 tablet 0   senna-docusate (SENOKOT-S) 8.6-50 MG tablet Take 1 tablet by mouth 2 (two) times daily. While taking strong pain meds to prevent constipation 10 tablet 0   amLODipine (NORVASC) 5 MG tablet Take 5 mg by mouth daily. (Patient not taking: Reported on 04/08/2023)     lenvatinib 10 mg daily dose (LENVIMA) capsule Take 1 capsule (10 mg  total) by mouth daily. 30 capsule 0   ondansetron (ZOFRAN) 8 MG tablet Take 1 tablet (8 mg total) by mouth every 8 (eight) hours as needed for nausea or vomiting. 30 tablet 1   prochlorperazine (COMPAZINE) 10 MG tablet Take 1 tablet (10 mg total) by mouth every 6 (six) hours as needed for nausea or vomiting. 30 tablet 1   No current facility-administered medications for this visit.    HISTORY OF PRESENT ILLNESS:   Oncology History  Breast cancer of upper-outer quadrant of right female breast (HCC)  06/11/2011 Surgery   Right breast mastectomy with right axillary sentinel lymph node biopsy 1.3 cm invasive lobular cancer, grade 1, atypical lobular hyperplasia, focal LV I, ER 91%, PR 25%, HER-2 negative, 0/8 lymph nodes T1 cN0 stage IA   07/25/2011 -  Anti-estrogen oral therapy   Letrozole 2.5 mg daily 7 years was a plan   Metastatic renal cell carcinoma, right (HCC)  02/01/2023 Initial Diagnosis   Renal cell carcinoma (HCC)   02/05/2023 Imaging   Bone scan: No evidence of osseous metastatic disease.    04/03/2023 Pathology Results   A. KIDNEY, RIGHT, RADICAL NEPHRECTOMY:  Clear cell renal cell carcinoma with rhabdoid, sarcomatoid Features and  tumor necrosis, nuclear grade 4, size 12.0 cm  Tumor extends into perinephric, renal sinus fat and segmental branches  of renal vein (pT3a)  Ureteral, vascular and all margins of resection are negative for tumor    Benign adrenal gland    04/23/2023 - 04/23/2023 Chemotherapy   Patient is on Treatment Plan : RENAL CELL Pembrolizumab (200) + Axitinib q21d     04/25/2023 -  Chemotherapy   Patient is on Treatment Plan : Clear Cell RCC Pembrolizumab (200) q21d         REVIEW OF SYSTEMS:   All relevant systems were reviewed with the patient and are negative.   VITALS:  Blood pressure 103/67, pulse (!) 104, temperature (!) 97.2 F (36.2 C), temperature source Temporal, resp. rate 17, weight 175 lb 6.4 oz (79.6 kg), SpO2 100%.  Wt Readings  from Last 3 Encounters:  05/06/23 175 lb 6.4 oz (79.6 kg)  04/30/23 175 lb 7.8 oz (79.6 kg)  04/26/23 175 lb 6.4 oz (79.6 kg)    Body mass index is 27.47 kg/m.  Performance status (ECOG): 3 - Symptomatic, >50% confined to bed  PHYSICAL EXAM:   GENERAL:alert, no distress and on wheelchair LUNGS: clear to auscultation with normal breathing effort.  No wheeze or rales ABDOMEN: abdomen soft, non-tender and nondistended Musculoskeletal: no edema NEURO: alert, fluent speech, no focal motor/sensory deficits.  Strength and sensation equal bilaterally.  LABORATORY DATA:  I have reviewed the data as listed    Component Value Date/Time   NA 135 05/06/2023 1444   NA 141 08/05/2015 0952   K 4.6 05/06/2023 1444   K 4.1 08/05/2015 0952   CL 97 (L) 05/06/2023 1444   CL 108 (H) 09/19/2012 1156   CO2 30 05/06/2023 1444   CO2 24 08/05/2015 0952   GLUCOSE 114 (H) 05/06/2023  1444   GLUCOSE 109 08/05/2015 0952   GLUCOSE 101 (H) 09/19/2012 1156   BUN 19 05/06/2023 1444   BUN 20.9 08/05/2015 0952   CREATININE 1.07 (H) 05/06/2023 1444   CREATININE 1.15 (H) 04/25/2023 1445   CREATININE 0.72 03/08/2023 1236   CREATININE 1.1 08/05/2015 0952   CALCIUM 9.3 05/06/2023 1444   CALCIUM 9.4 08/05/2015 0952   PROT 7.5 05/06/2023 1444   PROT 7.8 08/05/2015 0952   ALBUMIN 3.0 (L) 05/06/2023 1444   ALBUMIN 3.7 08/05/2015 0952   AST 9 (L) 05/06/2023 1444   AST 8 (L) 04/25/2023 1445   AST 16 08/05/2015 0952   ALT 10 05/06/2023 1444   ALT 7 04/25/2023 1445   ALT 17 08/05/2015 0952   ALKPHOS 102 05/06/2023 1444   ALKPHOS 104 08/05/2015 0952   BILITOT 0.4 05/06/2023 1444   BILITOT 0.5 04/25/2023 1445   BILITOT <0.30 08/05/2015 0952   GFRNONAA 52 (L) 05/06/2023 1444   GFRNONAA 48 (L) 04/25/2023 1445   GFRNONAA 39 (L) 08/12/2020 1502   GFRAA 45 (L) 08/12/2020 1502    No results found for: "SPEP", "UPEP"  Lab Results  Component Value Date   WBC 12.8 (H) 05/06/2023   NEUTROABS 8.9 (H) 05/06/2023    HGB 9.0 (L) 05/06/2023   HCT 30.0 (L) 05/06/2023   MCV 81.3 05/06/2023   PLT 336 05/06/2023      Chemistry      Component Value Date/Time   NA 135 05/06/2023 1444   NA 141 08/05/2015 0952   K 4.6 05/06/2023 1444   K 4.1 08/05/2015 0952   CL 97 (L) 05/06/2023 1444   CL 108 (H) 09/19/2012 1156   CO2 30 05/06/2023 1444   CO2 24 08/05/2015 0952   BUN 19 05/06/2023 1444   BUN 20.9 08/05/2015 0952   CREATININE 1.07 (H) 05/06/2023 1444   CREATININE 1.15 (H) 04/25/2023 1445   CREATININE 0.72 03/08/2023 1236   CREATININE 1.1 08/05/2015 0952      Component Value Date/Time   CALCIUM 9.3 05/06/2023 1444   CALCIUM 9.4 08/05/2015 0952   ALKPHOS 102 05/06/2023 1444   ALKPHOS 104 08/05/2015 0952   AST 9 (L) 05/06/2023 1444   AST 8 (L) 04/25/2023 1445   AST 16 08/05/2015 0952   ALT 10 05/06/2023 1444   ALT 7 04/25/2023 1445   ALT 17 08/05/2015 0952   BILITOT 0.4 05/06/2023 1444   BILITOT 0.5 04/25/2023 1445   BILITOT <0.30 08/05/2015 4166       RADIOGRAPHIC STUDIES: I have personally reviewed the radiological images as listed and agreed with the findings in the report. MR Brain W Wo Contrast Result Date: 05/06/2023 CLINICAL DATA:  History of metastatic renal cell carcinoma. Staging. EXAM: MRI HEAD WITHOUT AND WITH CONTRAST TECHNIQUE: Multiplanar, multiecho pulse sequences of the brain and surrounding structures were obtained without and with intravenous contrast. CONTRAST:  8mL GADAVIST GADOBUTROL 1 MMOL/ML IV SOLN COMPARISON:  Brain MRI 02/04/2023. FINDINGS: Brain: Punctate focus of acute infarction in the left occipital lobe (axial image 19 series 6). No acute hemorrhage or significant mass effect. No hydrocephalus or extra-axial collection. No mass or abnormal enhancement. No foci of abnormal susceptibility. Vascular: Normal flow voids and vessel enhancement. Skull and upper cervical spine: Normal marrow signal and enhancement. Sinuses/Orbits: No acute findings. Other: None.  IMPRESSION: 1. Punctate focus of acute infarction in the left occipital lobe. 2. No evidence of intracranial metastatic disease. Electronically Signed   By: Elwyn Reach.D.  On: 05/06/2023 08:36   IR IMAGING GUIDED PORT INSERTION Result Date: 04/30/2023 CLINICAL DATA:  History of right breast carcinoma post mastectomy and axillary node dissection. More recent right nephrectomy for clear cell carcinoma, needs durable venous access for planned treatment regimen EXAM: TUNNELED PORT CATHETER PLACEMENT WITH ULTRASOUND AND FLUOROSCOPIC GUIDANCE FLUOROSCOPY: Radiation Exposure Index (as provided by the fluoroscopic device): 6 mGy air Kerma ANESTHESIA/SEDATION: Intravenous Fentanyl and Versed 2mg  were administered by RN during a total moderate (conscious) sedation time of 28 minutes; the patient's level of consciousness and physiological / cardiorespiratory status were monitored continuously by radiology RN under my direct supervision. TECHNIQUE: The procedure, risks, benefits, and alternatives were explained to the patient. Questions regarding the procedure were encouraged and answered. The patient understands and consents to the procedure. A left approach was selected due to previous right mastectomy and axillary lymph node dissection, after discussion with the patient. Patency of the left IJ vein was confirmed with ultrasound with image documentation. An appropriate skin site was determined. Skin site was marked. Region was prepped using maximum barrier technique including cap and mask, sterile gown, sterile gloves, large sterile sheet, and Chlorhexidine as cutaneous antisepsis. The region was infiltrated locally with 1% lidocaine. Under real-time ultrasound guidance, the left IJ vein was accessed with a 21 gauge micropuncture needle; the needle tip within the vein was confirmed with ultrasound image documentation. Needle was exchanged over a 018 guidewire for transitional dilator, and vascular  measurement was performed. A small incision was made on the left anterior chest wall and a subcutaneous pocket fashioned. The power-injectable port was positioned and its catheter tunneled to the left IJ dermatotomy site. The transitional dilator was exchanged over an Amplatz wire for a peel-away sheath, through which the port catheter, which had been trimmed to the appropriate length, was advanced and positioned under fluoroscopy with its tip at the cavoatrial junction. Spot chest radiograph confirms good catheter position and no pneumothorax. The port was flushed per protocol. The pocket was closed with deep interrupted and subcuticular continuous 3-0 Monocryl sutures. The incisions were covered with Dermabond then covered with a sterile dressing. The patient tolerated the procedure well. COMPLICATIONS: COMPLICATIONS None immediate IMPRESSION: Technically successful left IJ power-injectable port catheter placement. Ready for routine use. Electronically Signed   By: Corlis Leak M.D.   On: 04/30/2023 15:07   ECHOCARDIOGRAM COMPLETE Result Date: 04/26/2023    ECHOCARDIOGRAM REPORT   Patient Name:   AVLEEN ATWAL Date of Exam: 04/26/2023 Medical Rec #:  [id number]       Height:       67.0 in Accession #:    [id number]      Weight:       168.9 lb Date of Birth:  11/05/1940       BSA:          1.882 m Patient Age:    82 years        BP:           112/74 mmHg Patient Gender: F               HR:           101 bpm. Exam Location:  Outpatient Procedure: 2D Echo, 3D Echo, Color Doppler, Cardiac Doppler and Strain Analysis Indications:    Chemo  History:        Patient has no prior history of Echocardiogram examinations.  Sonographer:    Harriette Bouillon RDCS Referring Phys: [id number] Michiel Sites Trini Soldo IMPRESSIONS  1. Low normal EF with abnormal strain Suggest f/u MRI/TTE in 3 months.  2. Abnormal septal motion . Left ventricular ejection fraction, by estimation, is 50 to 55%. The left ventricle has low normal function. The left  ventricle has no regional wall motion abnormalities. Left ventricular diastolic parameters are consistent with Grade I diastolic dysfunction (impaired relaxation). The average left ventricular global longitudinal strain is -11.3 %. The global longitudinal strain is abnormal.  3. Right ventricular systolic function is normal. The right ventricular size is normal.  4. The mitral valve is normal in structure. No evidence of mitral valve regurgitation. No evidence of mitral stenosis.  5. The aortic valve was not well visualized. Aortic valve regurgitation is not visualized. No aortic stenosis is present.  6. The inferior vena cava is normal in size with greater than 50% respiratory variability, suggesting right atrial pressure of 3 mmHg. FINDINGS  Left Ventricle: Abnormal septal motion. Left ventricular ejection fraction, by estimation, is 50 to 55%. The left ventricle has low normal function. The left ventricle has no regional wall motion abnormalities. The average left ventricular global longitudinal strain is -11.3 %. The global longitudinal strain is abnormal. The left ventricular internal cavity size was normal in size. There is no left ventricular hypertrophy. Left ventricular diastolic parameters are consistent with Grade I diastolic dysfunction (impaired relaxation). Right Ventricle: The right ventricular size is normal. No increase in right ventricular wall thickness. Right ventricular systolic function is normal. Left Atrium: Left atrial size was normal in size. Right Atrium: Right atrial size was normal in size. Pericardium: There is no evidence of pericardial effusion. Mitral Valve: The mitral valve is normal in structure. No evidence of mitral valve regurgitation. No evidence of mitral valve stenosis. Tricuspid Valve: The tricuspid valve is normal in structure. Tricuspid valve regurgitation is not demonstrated. No evidence of tricuspid stenosis. Aortic Valve: The aortic valve was not well visualized. Aortic  valve regurgitation is not visualized. No aortic stenosis is present. Pulmonic Valve: The pulmonic valve was normal in structure. Pulmonic valve regurgitation is not visualized. No evidence of pulmonic stenosis. Aorta: The aortic root is normal in size and structure. Venous: The inferior vena cava is normal in size with greater than 50% respiratory variability, suggesting right atrial pressure of 3 mmHg. IAS/Shunts: No atrial level shunt detected by color flow Doppler. Additional Comments: Low normal EF with abnormal strain Suggest f/u MRI/TTE in 3 months.  LEFT VENTRICLE PLAX 2D LVIDd:         3.80 cm      Diastology LVIDs:         3.20 cm      LV e' medial:    7.51 cm/s LV PW:         1.00 cm      LV E/e' medial:  9.5 LV IVS:        0.90 cm      LV e' lateral:   14.80 cm/s LVOT diam:     2.10 cm      LV E/e' lateral: 4.8 LV SV:         43 LV SV Index:   23           2D Longitudinal Strain LVOT Area:     3.46 cm     2D Strain GLS Avg:     -11.3 %  LV Volumes (MOD) LV vol d, MOD A2C: 99.5 ml LV vol d, MOD A4C: 101.0 ml LV vol s, MOD A2C: 55.2 ml  LV vol s, MOD A4C: 57.2 ml LV SV MOD A2C:     44.3 ml LV SV MOD A4C:     101.0 ml LV SV MOD BP:      43.4 ml RIGHT VENTRICLE             IVC RV S prime:     14.00 cm/s  IVC diam: 1.20 cm TAPSE (M-mode): 1.7 cm LEFT ATRIUM             Index        RIGHT ATRIUM          Index LA diam:        3.10 cm 1.65 cm/m   RA Area:     9.85 cm LA Vol (A2C):   43.1 ml 22.90 ml/m  RA Volume:   16.00 ml 8.50 ml/m LA Vol (A4C):   31.4 ml 16.68 ml/m LA Biplane Vol: 39.9 ml 21.20 ml/m  AORTIC VALVE LVOT Vmax:   86.70 cm/s LVOT Vmean:  58.300 cm/s LVOT VTI:    0.125 m  AORTA Ao Root diam: 3.10 cm Ao Asc diam:  3.30 cm MITRAL VALVE MV Area (PHT): 5.70 cm     SHUNTS MV Decel Time: 133 msec     Systemic VTI:  0.12 m MV E velocity: 71.60 cm/s   Systemic Diam: 2.10 cm MV A velocity: 137.00 cm/s MV E/A ratio:  0.52 Charlton Haws MD Electronically signed by Charlton Haws MD Signature Date/Time:  04/26/2023/12:01:54 PM    Final    CT Angio Chest PE W/Cm &/Or Wo Cm Result Date: 04/18/2023 CLINICAL DATA:  Patient underwent laparoscopic right nephrectomy for clear cell carcinoma 04/03/2023, presents with postoperative abdominal pain, flank and thoracic pain. She also had a right mastectomy in 2013 for carcinoma. EXAM: CT ANGIOGRAPHY CHEST CT ABDOMEN AND PELVIS WITH CONTRAST TECHNIQUE: Multidetector CT imaging of the chest was performed using the standard protocol during bolus administration of intravenous contrast. Multiplanar CT image reconstructions and MIPs were obtained to evaluate the vascular anatomy. Multidetector CT imaging of the abdomen and pelvis was performed using the standard protocol during bolus administration of intravenous contrast. RADIATION DOSE REDUCTION: This exam was performed according to the departmental dose-optimization program which includes automated exposure control, adjustment of the mA and/or kV according to patient size and/or use of iterative reconstruction technique. CONTRAST:  60mL OMNIPAQUE IOHEXOL 350 MG/ML SOLN COMPARISON:  Portable chest today, chest radiograph 03/11/2023, PET-CT 02/26/2023, chest CT with contrast 02/01/2023, and CTA abdomen and pelvis also done 02/01/2023. FINDINGS: CTA CHEST FINDINGS Cardiovascular: The pulmonary trunk slightly prominent 2.8 cm suggesting arterial hypertension or fluid overload. No findings of acute right heart strain. There is no visible arterial embolus. There is atherosclerosis with mild scattered calcific plaque of the aorta and great vessels. There is no aneurysm, stenosis or dissection. Mild aortic tortuosity. The cardiac size is normal. No pericardial effusion. There is no visible coronary calcification. No venous dilatation. Mediastinum/Nodes: Interval increased prominence of AP window lymph nodes, largest is 10 mm short axis on 2:46. Previously was 5 mm. Interval increased prominence of subcarinal lymph nodes up to 1.2 cm  in short axis on 2:61 with a few bilateral borderline prominent hilar nodes. There is no bulky or encasing adenopathy, no axillary adenopathy. Stable 1.5 cm heterogeneous right thyroid nodule is again noted with dystrophic calcifications. This has not changed in size since the chest CT from January 2012 therefore not requiring follow-up. The thoracic trachea, the bilateral main bronchi, thoracic  esophagus unremarkable. There is a tiny hiatal hernia. Lungs/Pleura: Progressive metastatic lung disease since the 02/26/2023 PET-CT. Most of the metastases show varying degrees of central necrosis. There is a new paramediastinal mass in the right upper lobe measuring 2.7 x 2.8 cm on 3:24. There is a new lobular pleural based mass posteriorly in the right upper lobe measuring 3.7 x 3.9 cm on 3:31. Above this an interval enlarged mass measuring 2.1 x 1.7 cm on 3:24, previously 1.9 x 1.3 cm. Elsewhere, there are new nodules in the base of the right upper lobe measuring 1 cm and 0.5 cm both on 3:46; undo right middle lobe metastasis measuring 1.2 cm on 3:38; enlarging right lower lobe nodule measuring 1.4 cm on 3:83 which was only 5 mm previously; enlarging left lower lobe nodule measuring 2.6 x 1.3 cm on 3:74, previously 1.1 x 0.7 cm; enlarging left upper lobe nodule measuring 1.8 x 1.6 cm on 3:53, previously was 1.3 x 1 cm; and several other additional small new nodules scattered within the left upper and both lower lobes. There is linear atelectasis in the lung bases without focal consolidation. Mild bronchial thickening in both lower lobes. There are trace pleural effusions.  No pneumothorax. Musculoskeletal: No destructive bone lesions are seen, but there is a new nondisplaced fracture of the lateral right eighth rib which could be pathologic as there is lucency surrounding the fracture which was not seen previously. Review of the MIP images confirms the above findings. CT ABDOMEN and PELVIS FINDINGS Hepatobiliary:  Gallbladder dilatation is similar to prior studies. The gallbladder is packed with stones, largest is 3.6 cm. No wall thickening or biliary dilatation is seen. There is no mass enhancement of the liver. Pancreas: Partially atrophic.  Otherwise unremarkable. Spleen: No abnormality. Adrenals/Urinary Tract: No adrenal mass. There are Bosniak 1 cysts of the left kidney, 2.1 cm in the lower pole and 1.4 cm midpole. Interval right nephrectomy. There is an elongate fluid collection in Morrison's pouch just below the level of the right adrenal gland measuring 6.4 x 1.8 cm and 12.5 Hounsfield units which is most likely a seroma. There is no space-occupying hematoma, no abscess is seen. No mass or adenopathy is evident in the right nephrectomy bed. No urinary stone or obstruction. There are low ureteral insertions consistent with pelvic floor laxity and a small cystocele again noted. The bladder is unremarkable. Stomach/Bowel: No dilatation or wall thickening. Normal appendix. Uncomplicated sigmoid diverticulosis. Endoscopy clips again noted in the ascending colon. Uncomplicated sigmoid diverticulosis. Vascular/Lymphatic: Aortic atherosclerosis. No enlarged abdominal or pelvic lymph nodes. Reproductive: Status post hysterectomy. No adnexal masses. Other: There is a heterogeneous low-density subcutaneous collection in the right upper quadrant, ranging 07/25/2023 Hounsfield units and measuring 7.5 x 3.7 x 10.5 cm in total. This is probably a hematoma related to 1 of the trocar insertion sites. Infectious process is not excluded. There is patchy soft tissue gas overlying the anterior wall on both sides of the abdomen beginning just below the ribcage on the right and continuing down over the low anterior pelvic wall bilaterally, where there is more moderate patchy subcutaneous air. This is not associated with any fluid collection and is probably related to the recent nephrectomy. Correlate clinically for infectious process. There  is no pelvic ascites. There is no free hemorrhage or free air. No incarcerated hernia. Musculoskeletal: There is an ill-defined lytic lesion, most likely a metastasis, in the posterior left ilium measuring 2.9 cm on 5:70, which was hypermetabolic on the PET-CT. There  is no other visible bone metastasis. There are degenerative changes of the spine. Review of the MIP images confirms the above findings. IMPRESSION: 1. No visible arterial embolus. 2. Prominent pulmonary trunk suggesting arterial hypertension or fluid overload. No findings of acute right heart strain. 3. Progressive metastatic lung disease with both new and interval enlarging masses. 4. New nondisplaced fracture of the lateral right eighth rib which could be pathologic as there is lucency surrounding the fracture which was not seen previously. 5. Trace pleural effusions. 6. Interval right nephrectomy with 6.4 x 1.8 cm fluid collection in Morrison's pouch just below the level of the right adrenal gland, most likely a seroma. No space-occupying hematoma, no abscess is seen. 7. 7.5 x 3.7 x 10.5 cm heterogeneous low-density subcutaneous collection in the right upper quadrant, probably a hematoma related to 1 of the trocar insertion sites. Infectious process is not excluded. 8. Patchy soft tissue gas overlying the anterior wall on both sides of the abdomen and pelvis, probably related to the recent nephrectomy. Correlate clinically for infectious process. 9. 2.9 cm lytic lesion in the posterior left ilium, most likely a metastasis, which was hypermetabolic on the PET-CT. No other visible bone metastasis at the level of the abdomen and pelvis. 10. Cholelithiasis with stable gallbladder dilatation. 11. Pelvic floor laxity with low ureteral insertions and a small cystocele. 12. Diverticulosis without evidence of diverticulitis. 13. Aortic atherosclerosis. Aortic Atherosclerosis (ICD10-I70.0). Electronically Signed   By: Almira Bar M.D.   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 She also had a right mastectomy in 2013 for carcinoma. EXAM: CT ANGIOGRAPHY CHEST CT ABDOMEN AND PELVIS WITH CONTRAST TECHNIQUE: Multidetector CT imaging of the chest was performed using the standard protocol during bolus administration of intravenous contrast. Multiplanar CT image reconstructions and MIPs were obtained to evaluate the vascular anatomy. Multidetector CT imaging of the abdomen and pelvis was performed using the standard protocol during bolus administration of intravenous contrast. RADIATION DOSE REDUCTION: This exam was performed according to the departmental dose-optimization program which includes automated exposure control, adjustment of the mA and/or kV according to patient size and/or use of iterative reconstruction technique. CONTRAST:  60mL OMNIPAQUE IOHEXOL 350 MG/ML SOLN COMPARISON:  Portable chest today, chest radiograph 03/11/2023, PET-CT 02/26/2023, chest CT with contrast 02/01/2023, and CTA abdomen and pelvis also done 02/01/2023. FINDINGS: CTA CHEST FINDINGS Cardiovascular: The pulmonary trunk slightly prominent 2.8 cm suggesting arterial hypertension or fluid overload. No findings of acute right heart strain. There is no visible arterial embolus. There is atherosclerosis with mild scattered calcific plaque of the aorta and great vessels. There is no aneurysm, stenosis or dissection. Mild aortic tortuosity. The cardiac size is normal. No pericardial effusion. There is no visible coronary calcification. No venous dilatation. Mediastinum/Nodes: Interval increased prominence of AP window lymph nodes, largest is 10 mm short axis on 2:46. Previously was 5 mm. Interval increased prominence of subcarinal lymph nodes up to 1.2 cm in short axis on 2:61 with a few  bilateral borderline prominent hilar nodes. There is no bulky or encasing adenopathy, no axillary adenopathy. Stable 1.5 cm heterogeneous right thyroid nodule is again noted with dystrophic calcifications. This has not changed in size since the chest CT from January 2012 therefore not requiring follow-up. The thoracic trachea, the bilateral main bronchi, thoracic esophagus unremarkable. There is a tiny  hiatal hernia. Lungs/Pleura: Progressive metastatic lung disease since the 02/26/2023 PET-CT. Most of the metastases show varying degrees of central necrosis. There is a new paramediastinal mass in the right upper lobe measuring 2.7 x 2.8 cm on 3:24. There is a new lobular pleural based mass posteriorly in the right upper lobe measuring 3.7 x 3.9 cm on 3:31. Above this an interval enlarged mass measuring 2.1 x 1.7 cm on 3:24, previously 1.9 x 1.3 cm. Elsewhere, there are new nodules in the base of the right upper lobe measuring 1 cm and 0.5 cm both on 3:46; undo right middle lobe metastasis measuring 1.2 cm on 3:38; enlarging right lower lobe nodule measuring 1.4 cm on 3:83 which was only 5 mm previously; enlarging left lower lobe nodule measuring 2.6 x 1.3 cm on 3:74, previously 1.1 x 0.7 cm; enlarging left upper lobe nodule measuring 1.8 x 1.6 cm on 3:53, previously was 1.3 x 1 cm; and several other additional small new nodules scattered within the left upper and both lower lobes. There is linear atelectasis in the lung bases without focal consolidation. Mild bronchial thickening in both lower lobes. There are trace pleural effusions.  No pneumothorax. Musculoskeletal: No destructive bone lesions are seen, but there is a new nondisplaced fracture of the lateral right eighth rib which could be pathologic as there is lucency surrounding the fracture which was not seen previously. Review of the MIP images confirms the above findings. CT ABDOMEN and PELVIS FINDINGS Hepatobiliary: Gallbladder dilatation is similar to  prior studies. The gallbladder is packed with stones, largest is 3.6 cm. No wall thickening or biliary dilatation is seen. There is no mass enhancement of the liver. Pancreas: Partially atrophic.  Otherwise unremarkable. Spleen: No abnormality. Adrenals/Urinary Tract: No adrenal mass. There are Bosniak 1 cysts of the left kidney, 2.1 cm in the lower pole and 1.4 cm midpole. Interval right nephrectomy. There is an elongate fluid collection in Morrison's pouch just below the level of the right adrenal gland measuring 6.4 x 1.8 cm and 12.5 Hounsfield units which is most likely a seroma. There is no space-occupying hematoma, no abscess is seen. No mass or adenopathy is evident in the right nephrectomy bed. No urinary stone or obstruction. There are low ureteral insertions consistent with pelvic floor laxity and a small cystocele again noted. The bladder is unremarkable. Stomach/Bowel: No dilatation or wall thickening. Normal appendix. Uncomplicated sigmoid diverticulosis. Endoscopy clips again noted in the ascending colon. Uncomplicated sigmoid diverticulosis. Vascular/Lymphatic: Aortic atherosclerosis. No enlarged abdominal or pelvic lymph nodes. Reproductive: Status post hysterectomy. No adnexal masses. Other: There is a heterogeneous low-density subcutaneous collection in the right upper quadrant, ranging 07/25/2023 Hounsfield units and measuring 7.5 x 3.7 x 10.5 cm in total. This is probably a hematoma related to 1 of the trocar insertion sites. Infectious process is not excluded. There is patchy soft tissue gas overlying the anterior wall on both sides of the abdomen beginning just below the ribcage on the right and continuing down over the low anterior pelvic wall bilaterally, where there is more moderate patchy subcutaneous air. This is not associated with any fluid collection and is probably related to the recent nephrectomy. Correlate clinically for infectious process. There is no pelvic ascites. There is no  free hemorrhage or free air. No incarcerated hernia. Musculoskeletal: There is an ill-defined lytic lesion, most likely a metastasis, in the posterior left ilium measuring 2.9 cm on 5:70, which was hypermetabolic on the PET-CT. There is no other visible bone metastasis.  There are degenerative changes of the spine. Review of the MIP images confirms the above findings. IMPRESSION: 1. No visible arterial embolus. 2. Prominent pulmonary trunk suggesting arterial hypertension or fluid overload. No findings of acute right heart strain. 3. Progressive metastatic lung disease with both new and interval enlarging masses. 4. New nondisplaced fracture of the lateral right eighth rib which could be pathologic as there is lucency surrounding the fracture which was not seen previously. 5. Trace pleural effusions. 6. Interval right nephrectomy with 6.4 x 1.8 cm fluid collection in Morrison's pouch just below the level of the right adrenal gland, most likely a seroma. No space-occupying hematoma, no abscess is seen. 7. 7.5 x 3.7 x 10.5 cm heterogeneous low-density subcutaneous collection in the right upper quadrant, probably a hematoma related to 1 of the trocar insertion sites. Infectious process is not excluded. 8. Patchy soft tissue gas overlying the anterior wall on both sides of the abdomen and pelvis, probably related to the recent nephrectomy. Correlate clinically for infectious process. 9. 2.9 cm lytic lesion in the posterior left ilium, most likely a metastasis, which was hypermetabolic on the PET-CT. No other visible bone metastasis at the level of the abdomen and pelvis. 10. Cholelithiasis with stable gallbladder dilatation. 11. Pelvic floor laxity with low ureteral insertions and a small cystocele. 12. Diverticulosis without evidence of diverticulitis. 13. Aortic atherosclerosis. Aortic Atherosclerosis (ICD10-I70.0). Electronically Signed   By: Almira Bar M.D.   On: 04/18/2023 23:38   CT CHEST LIMITED WO  CONTRAST Result Date: 04/18/2023 CLINICAL DATA:  82 year old female presenting for lung mass biopsy. EXAM: CT CHEST WITHOUT CONTRAST TECHNIQUE: Multidetector CT imaging of the chest was performed following the standard protocol without IV contrast. RADIATION DOSE REDUCTION: This exam was performed according to the departmental dose-optimization program which includes automated exposure control, adjustment of the mA and/or kV according to patient size and/or use of iterative reconstruction technique. COMPARISON:  PET-CT from 02/26/2023, CT chest from 02/01/2023 FINDINGS: Cardiovascular: Mild scattered atherosclerotic calcifications about the aortic arch. Normal heart size. No pericardial effusion. Mediastinum/Nodes: There are few small scattered mediastinal and hilar lymph nodes, similar to comparison. Unchanged peripherally calcified right thyroid nodule. Lungs/Pleura: Small right apical pneumothorax. Interval progression of multifocal bilateral solid pulmonary nodules, the largest measuring up to 3.9 cm in the right upper lobe, previously approximally 1.8 cm on PET-CT. Interval development of trace right pleural effusion. Upper Abdomen: No acute abnormality. Musculoskeletal: No chest wall mass or suspicious bone lesions identified. IMPRESSION: 1. Small right apical pneumothorax. 2. Interval progression of multifocal bilateral solid pulmonary nodules, suggestive of progressing metastatic disease. 3. Interval development of trace right pleural effusion. 4. Artic atherosclerosis (ICD10-I70.0). These results, in addition to cancellation of planned biopsy, were called by telephone on 04/10/2023 to provider [facility] , who verbally acknowledged these results. Marliss Coots, MD [facility] Electronically Signed   By: Marliss Coots M.D.   On: 04/18/2023 15:42   DG Chest Portable 1 View Result Date: 04/18/2023 CLINICAL DATA:  Right flank pain. Cough. History  of renal cell carcinoma with pulmonary metastatic disease. EXAM: PORTABLE CHEST 1 VIEW COMPARISON:  Radiographs 03/11/2023 and 02/12/2023. PET-CT 02/26/2023. FINDINGS: 1435 hours. Interval improved aeration of both lungs with decreased bilateral pulmonary opacities since 03/11/2023. There is mild residual left lower lobe atelectasis. Multiple pulmonary nodules are again noted which appear progressive. There is no pleural effusion or pneumothorax. The heart size and mediastinal contours are stable. IMPRESSION: 1. Interval improved aeration  of both lungs with decreased bilateral pulmonary opacities since 03/11/2023. 2. Progressive pulmonary metastatic disease. 3. No definite acute findings. Electronically Signed   By: Carey Bullocks M.D.   On: 04/18/2023 15:32

## 2023-05-06 NOTE — Assessment & Plan Note (Signed)
 Drug monitoring for lenvatinib. Monitor for hypertension Baseline EKG normal QTc LFT, renal function, electrolytes, every 2 weeks for 2 months and then monthly thereafter Baseline TSH and free T4 and then monthly urine dipstick; if 2+ then obtain a 24-hour urine protein Dental exam before and periodically Monitor for signs of bleeding, wound healing, thrombosis Follow up on 12/30 after lab as scheduled.

## 2023-05-06 NOTE — Assessment & Plan Note (Signed)
Continue Marinol 5 mg twice daily.  Refill Prescription sent to her pharmacy.

## 2023-05-09 ENCOUNTER — Other Ambulatory Visit: Payer: Self-pay | Admitting: *Deleted

## 2023-05-09 NOTE — Patient Instructions (Signed)
 Visit Information  Thank you for taking time to visit with me today. Please don't hesitate to contact me if I can be of assistance to you before our next scheduled telephone appointment.  Following are the goals we discussed today:   Goals Addressed             This Visit's Progress    COMPLETED: Transitions of Care/ pt will have no readmissions within 30 days       Current Barriers:  Knowledge Deficits related to plan of care for management of post op right lap radical nephrectomy  Spoke with patient and permission give to speak with adult daughter Akera Snowberger who reports pt is doing well, has all medications and taking as prescribed, has all specialist follow up appointments scheduled, pain is controlled well, has not needed to take tramadol , no signs/ symptoms of infection reported Patient continues to follow up with oncologist, labwork monitored frequently, pt has implanted port daughter reports is unremarkable Patient reports feeling stronger Per daughter, pt began immunotherapy infusion 04/25/23 tomorrow and attended a class about what to expect, pt is doing well,  pt is trying to eat more and is doing better with this, is on marinol  to increase appetite Patient continues infusion and po medication per daughter, treatment consists of Pembrolizumab  with lenvatinib , pt is tolerating well, has some diarrhea per daughter and family is monitoring, also checking patient's temperature daily, daughter states oncology has been very helpful and answer any and all questions she has.  RNCM Clinical Goal(s):  Patient will verbalize understanding of plan for management of post op right lap radical nephrectomy as evidenced by patient report, review of EMR take all medications exactly as prescribed and will call provider for medication related questions as evidenced by patient report, review of EMR attend all scheduled medical appointments: infusions, labwork as evidenced by patient report,  review of EMR and  through collaboration with RN Care manager, provider, and care team.   Interventions: Evaluation of current treatment plan related to  self management and patient's adherence to plan as established by provider Reviewed plan of care including case closure, daughter feels she has good support with oncology Post op right lap radical nephrectomy  (Status:  New goal., Goal on track:  Yes., Goal Met., and Patient declined further engagement on this goal.)  Short Term Goal Evaluation of current treatment plan related to  post op surgery , ADL IADL limitations self-management and patient's adherence to plan as established by provider. Reviewed all upcoming scheduled appointments Reinforced safety precautions Reinforced signs/ symptoms of infection and prevention (handwashing, avoiding sick persons, wearing a mask as needed) Reinforced offering small, frequent meals, consuming adequate fluids, taking marinol  as prescribed  Patient Goals/Self-Care Activities: Participate in Transition of Care Program/Attend TOC scheduled calls Notify RN Care Manager of TOC call rescheduling needs Take all medications as prescribed Attend all scheduled provider appointments Call pharmacy for medication refills 3-7 days in advance of running out of medications Call provider office for new concerns or questions  Practice good handwashing, avoid sick people, wear a mask as needed Eat small frequent meals Drink adequate fluids, preferably water Transition of care case closure  Follow Up Plan:  No further follow up required: case closure          Please call the care guide team at [phone] if you need to cancel or reschedule your appointment.   If you are experiencing a Mental Health or Behavioral Health Crisis or need someone to talk  to, please call the Suicide and Crisis Lifeline: 988 call the [place]  National Suicide Prevention Lifeline: 737-175-8980 or TTY: (724) 080-3698 TTY (984) 327-3200) to  talk to a trained counselor call 1-800-273-TALK (toll free, 24 hour hotline) go to [facility] [address] [place] [phone]) call the [facility] Crisis Line: [phone] call 911   Patient verbalizes understanding of instructions and care plan provided today and agrees to view in MyChart. Active MyChart status and patient understanding of how to access instructions and care plan via MyChart confirmed with patient.     Mliss Creed [facility], BSN RN Care Manager/ Transition of Care [place]/ [place] [facility] [phone]

## 2023-05-09 NOTE — Patient Outreach (Signed)
 Care Management  Transitions of Care Program Transitions of Care Post-discharge week 5   05/09/2023 Name: Diane Paul MRN: [id number] DOB: 12-Apr-1941  Subjective: Diane Paul is a 83 y.o. year old female who is a primary care patient of Pickard, Butler DASEN, MD. The Care Management team Engaged with patient Engaged with patient by telephone to assess and address transitions of care needs.   Assessment: Spoke with daughter Diane Paul who reports pt is doing well, tolerating well treatments for cancer, following up for scheduled labwork, no issues with portacath, marinol  continues to help increase appetite, daughter states they have good resources/ adequate help with oncologist answering any and all questions.          SDOH Interventions    Flowsheet Row Telephone from 04/08/2023 in [place] [facility] Clinical Support from 03/25/2023 in [facility] Of [place] Clinical Support from 07/25/2022 in [facility] Clinical Support from 07/07/2021 in [facility]  SDOH Interventions      Food Insecurity Interventions Intervention Not Indicated -- Intervention Not Indicated Intervention Not Indicated  Housing Interventions Intervention Not Indicated Intervention Not Indicated Intervention Not Indicated Intervention Not Indicated  Transportation Interventions Intervention Not Indicated -- Intervention Not Indicated Intervention Not Indicated  Utilities Interventions Intervention Not Indicated -- Intervention Not Indicated --  Alcohol Usage Interventions -- -- Intervention Not Indicated (Score <7) --  Financial Strain Interventions -- Intervention Not Indicated Intervention Not Indicated Intervention Not Indicated  Physical Activity Interventions -- -- Intervention Not Indicated Other (Comments)  [Walks to mailbox and works in garden. Encouraged pt to walk more at tolerated.]  Stress  Interventions -- -- Intervention Not Indicated Intervention Not Indicated  Social Connections Interventions -- -- Intervention Not Indicated Intervention Not Indicated        Goals Addressed             This Visit's Progress    COMPLETED: Transitions of Care/ pt will have no readmissions within 30 days       Current Barriers:  Knowledge Deficits related to plan of care for management of post op right lap radical nephrectomy  Spoke with patient and permission give to speak with adult daughter Diane Paul who reports pt is doing well, has all medications and taking as prescribed, has all specialist follow up appointments scheduled, pain is controlled well, has not needed to take tramadol , no signs/ symptoms of infection reported Patient continues to follow up with oncologist, labwork monitored frequently, pt has implanted port daughter reports is unremarkable Patient reports feeling stronger Per daughter, pt began immunotherapy infusion 04/25/23 tomorrow and attended a class about what to expect, pt is doing well,  pt is trying to eat more and is doing better with this, is on marinol  to increase appetite Patient continues infusion and po medication per daughter, treatment consists of Pembrolizumab  with lenvatinib , pt is tolerating well, has some diarrhea per daughter and family is monitoring, also checking patient's temperature daily, daughter states oncology has been very helpful and answer any and all questions she has.  RNCM Clinical Goal(s):  Patient will verbalize understanding of plan for management of post op right lap radical nephrectomy as evidenced by patient report, review of EMR take all medications exactly as prescribed and will call provider for medication related questions as evidenced by patient report, review of EMR attend all scheduled medical appointments: infusions, labwork as evidenced by patient  report, review of EMR and  through collaboration with RN Care manager,  provider, and care team.   Interventions: Evaluation of current treatment plan related to  self management and patient's adherence to plan as established by provider Reviewed plan of care including case closure, daughter feels she has good support with oncology Post op right lap radical nephrectomy  (Status:  New goal., Goal on track:  Yes., Goal Met., and Patient declined further engagement on this goal.)  Short Term Goal Evaluation of current treatment plan related to  post op surgery , ADL IADL limitations self-management and patient's adherence to plan as established by provider. Reviewed all upcoming scheduled appointments Reinforced safety precautions Reinforced signs/ symptoms of infection and prevention (handwashing, avoiding sick persons, wearing a mask as needed) Reinforced offering small, frequent meals, consuming adequate fluids, taking marinol  as prescribed  Patient Goals/Self-Care Activities: Participate in Transition of Care Program/Attend TOC scheduled calls Notify RN Care Manager of TOC call rescheduling needs Take all medications as prescribed Attend all scheduled provider appointments Call pharmacy for medication refills 3-7 days in advance of running out of medications Call provider office for new concerns or questions  Practice good handwashing, avoid sick people, wear a mask as needed Eat small frequent meals Drink adequate fluids, preferably water Transition of care case closure  Follow Up Plan:  No further follow up required: case closure          Plan: The patient has been provided with contact information for the care management team and has been advised to call with any health related questions or concerns.   Case closure  Mliss Creed [facility], BSN RN Care Manager/ Transition of Care [place]/ [facility] [phone]

## 2023-05-13 ENCOUNTER — Other Ambulatory Visit: Payer: Self-pay | Admitting: *Deleted

## 2023-05-13 MED ORDER — LIDOCAINE-PRILOCAINE 2.5-2.5 % EX CREA: 1.0000 | TOPICAL_CREAM | CUTANEOUS | 0 refills | Status: DC | PRN

## 2023-05-14 ENCOUNTER — Other Ambulatory Visit: Payer: Self-pay | Admitting: Internal Medicine

## 2023-05-14 ENCOUNTER — Other Ambulatory Visit (HOSPITAL_BASED_OUTPATIENT_CLINIC_OR_DEPARTMENT_OTHER): Payer: Self-pay

## 2023-05-14 NOTE — Progress Notes (Signed)
 Patient Care Team: Duanne Butler DASEN, MD as PCP - General (Family Medicine)  Clinic Day:  05/16/2023  Referring physician: Tina Pauletta BROCKS, MD  ASSESSMENT & PLAN:   Assessment & Plan: 83 y.o.w. 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   Diagnosis: stage IV clear cell RCC Treatment: Right nephrectomy. 04/26/23 started pembrolizumab . Delayed started of Lenvatinib  at 10 mg daily due to wound on 05/07/23   No signs of wound infection.  Discussed Neosporin, dressing, and keep it clean.  Continue to monitor for changes.  No signs of bleeding.  Worsening left hip pain.  Will obtain CT for evaluation.  Will refer for radiation if indicated  Will need to monitor drug toxicities with labs and exam on lenvatinib .  She is not a candidate for anticoagulation or aspirin due to wound not healing, high risk medication with lenvatinib  for hemorrhage and bleeding at this time.  Will continue to monitor.  She has no symptoms of stroke.  Her cancer need to be controlled with heavy disease burden at this time point.  Will continue to monitor closely.  Malignant neoplasm metastatic to both lungs [facility]) Continue treatment for kidney cancer with len/pembro  Decreased appetite Continue Marinol  5 mg twice daily.  Refill Prescription sent to her pharmacy.  At risk for side effect of medication Drug monitoring for lenvatinib . Monitor for hypertension Baseline EKG normal QTc LFT, renal function, electrolytes, every 2 weeks for 2 months and then monthly thereafter Baseline TSH and free T4 and then monthly urine dipstick; if 2+ then obtain a 24-hour urine protein Dental exam before and periodically Monitor for signs of bleeding, wound healing, thrombosis Follow up on 12/30 after lab as scheduled.  Left hip pain No fracture on XR Check CT noncontrast for evaluation  CVA (cerebral vascular accident)  (HCC) Currently not a candidate for anticoagulation or aspirin due to high risk of bleeding and hemorrhage and wound not completely healed Asymptomatic.  Will continue monitoring.  Metastatic renal cell carcinoma, right (HCC) continue lenvatinib  started on 12/31 Continue with pembrolizumab  lenvatinib  at 10 mg daily for now   Return to see me next Friday with labs. The patient understands the plans discussed today and is in agreement with them.  She knows to contact our office if she develops concerns prior to her next appointment.  Pauletta BROCKS Tina, MD  [facility] - A DEPT [facility] [address] [place] [address] Dept: [phone] Dept Fax: [phone]   Orders Placed This Encounter  Procedures   CT HIP LEFT WO CONTRAST    Standing Status:   Future    Expected Date:   05/20/2023    Expiration Date:   05/15/2024    Preferred imaging location?:   [facility]   CMP (Cancer Center only)    Standing Status:   Future    Expiration Date:   05/15/2024      CHIEF COMPLAINT:  CC: Stage IV kidney cancer  Current Treatment: Lenvatinib  pembrolizumab   INTERVAL HISTORY:  Diane Paul is here today for repeat clinical assessment.   No headaches.  BP at home <140. Today is 130/78. She is drinking more and trying to eat better.    No more sores.  No difficulty urinating or hematuria.  No palpitation, chest pain  No edema, chest pain, short  of breath, leg swelling, focal weakness, bleeding, diarrhea, and wound. Ankle swelling unchanged.  Dental issue: no teeth  Report worsening left joint pain and hard to raise left leg. No paresthesia.  I have reviewed the past medical history, past surgical history, social history and family history with the patient and they are unchanged from previous note.  ALLERGIES:  is allergic to fosamax  [alendronate  sodium].  MEDICATIONS:  Current Outpatient Medications   Medication Sig Dispense Refill   amLODipine  (NORVASC ) 5 MG tablet Take 5 mg by mouth daily. (Patient not taking: Reported on 04/08/2023)     dronabinol  (MARINOL ) 5 MG capsule Take 1 capsule (5 mg total) by mouth 2 (two) times daily before a meal. 30 capsule 0   Ensure (ENSURE) Take 237 mLs by mouth 2 (two) times daily between meals.     HYDROcodone -acetaminophen  (NORCO) 5-325 MG tablet Take 1 tablet by mouth every 6 (six) hours as needed for moderate pain (pain score 4-6) or severe pain (pain score 7-10). 120 tablet 0   lenvatinib  10 mg daily dose (LENVIMA ) capsule Take 1 capsule (10 mg total) by mouth daily. 30 capsule 0   lidocaine -prilocaine  (EMLA ) cream Apply 1 Application topically as needed. Apply to port ~ 1 hour prior to access 30 g 0   ondansetron  (ZOFRAN ) 8 MG tablet Take 1 tablet (8 mg total) by mouth every 8 (eight) hours as needed for nausea or vomiting. 30 tablet 1   prochlorperazine  (COMPAZINE ) 10 MG tablet Take 1 tablet (10 mg total) by mouth every 6 (six) hours as needed for nausea or vomiting. 30 tablet 1   senna-docusate (SENOKOT-S) 8.6-50 MG tablet Take 1 tablet by mouth 2 (two) times daily. While taking strong pain meds to prevent constipation 10 tablet 0   No current facility-administered medications for this visit.    HISTORY OF PRESENT ILLNESS:   Oncology History  Breast cancer of upper-outer quadrant of right female breast (HCC)  06/11/2011 Surgery   Right breast mastectomy with right axillary sentinel lymph node biopsy 1.3 cm invasive lobular cancer, grade 1, atypical lobular hyperplasia, focal LV I, ER 91%, PR 25%, HER-2 negative, 0/8 lymph nodes T1 cN0 stage IA   07/25/2011 -  Anti-estrogen oral therapy   Letrozole  2.5 mg daily 7 years was a plan   Metastatic renal cell carcinoma, right (HCC)  02/01/2023 Initial Diagnosis   Renal cell carcinoma (HCC)   02/05/2023 Imaging   Bone scan: No evidence of osseous metastatic disease.    04/03/2023 Pathology Results    A. KIDNEY, RIGHT, RADICAL NEPHRECTOMY:  Clear cell renal cell carcinoma with rhabdoid, sarcomatoid Features and  tumor necrosis, nuclear grade 4, size 12.0 cm  Tumor extends into perinephric, renal sinus fat and segmental branches  of renal vein (pT3a)  Ureteral, vascular and all margins of resection are negative for tumor    Benign adrenal gland    04/23/2023 - 04/23/2023 Chemotherapy   Patient is on Treatment Plan : RENAL CELL Pembrolizumab  (200) + Axitinib q21d     04/25/2023 -  Chemotherapy   Patient is on Treatment Plan : Clear Cell RCC Pembrolizumab  (200) q21d         REVIEW OF SYSTEMS:   All relevant systems were reviewed with the patient and are negative.   VITALS:  Blood pressure 130/78, pulse 80, temperature 97.7 F (36.5 C), temperature source Oral, resp. rate 16, weight 173 lb 1.6 oz (78.5 kg), SpO2 100%.  Wt Readings from Last 3 Encounters:  05/16/23 173  lb 1.6 oz (78.5 kg)  05/15/23 175 lb (79.4 kg)  05/06/23 175 lb 6.4 oz (79.6 kg)    Body mass index is 27.11 kg/m.  Performance status (ECOG): 3 - Symptomatic, >50% confined to bed  PHYSICAL EXAM:   GENERAL:alert, no distress and comfortable SKIN: Right side abdominal wall covered in dressing.  No bleeding, no erythema, no rashes  EYES: normal, sclera clear OROPHARYNX: no exudate, no erythema    NECK: supple,  non-tender, without nodularity LYMPH:  no palpable cervical lymphadenopathy LUNGS: clear to auscultation with normal breathing effort.  No wheeze or rales HEART: regular rate & rhythm and no murmurs and no lower extremity edema ABDOMEN: abdomen soft, non-tender and nondistended Musculoskeletal: no edema NEURO: alert, fluent speech  LABORATORY DATA:  I have reviewed the data as listed    Component Value Date/Time   NA 136 05/16/2023 0823   NA 141 08/05/2015 0952   K 4.8 05/16/2023 0823   K 4.1 08/05/2015 0952   CL 99 05/16/2023 0823   CL 108 (H) 09/19/2012 1156   CO2 30 05/16/2023 0823    CO2 24 08/05/2015 0952   GLUCOSE 105 (H) 05/16/2023 0823   GLUCOSE 109 08/05/2015 0952   GLUCOSE 101 (H) 09/19/2012 1156   BUN 17 05/16/2023 0823   BUN 20.9 08/05/2015 0952   CREATININE 1.07 (H) 05/16/2023 0823   CREATININE 0.72 03/08/2023 1236   CREATININE 1.1 08/05/2015 0952   CALCIUM  9.4 05/16/2023 0823   CALCIUM  9.4 08/05/2015 0952   PROT 7.8 05/16/2023 0823   PROT 7.8 08/05/2015 0952   ALBUMIN  3.2 (L) 05/16/2023 0823   ALBUMIN  3.7 08/05/2015 0952   AST 12 (L) 05/16/2023 0823   AST 16 08/05/2015 0952   ALT 11 05/16/2023 0823   ALT 17 08/05/2015 0952   ALKPHOS 103 05/16/2023 0823   ALKPHOS 104 08/05/2015 0952   BILITOT 0.4 05/16/2023 0823   BILITOT <0.30 08/05/2015 0952   GFRNONAA 52 (L) 05/16/2023 0823   GFRNONAA 39 (L) 08/12/2020 1502   GFRAA 45 (L) 08/12/2020 1502    No results found for: SPEP, UPEP  Lab Results  Component Value Date   WBC 8.1 05/16/2023   NEUTROABS 4.5 05/16/2023   HGB 9.8 (L) 05/16/2023   HCT 32.5 (L) 05/16/2023   MCV 82.1 05/16/2023   PLT 391 05/16/2023      Chemistry      Component Value Date/Time   NA 136 05/16/2023 0823   NA 141 08/05/2015 0952   K 4.8 05/16/2023 0823   K 4.1 08/05/2015 0952   CL 99 05/16/2023 0823   CL 108 (H) 09/19/2012 1156   CO2 30 05/16/2023 0823   CO2 24 08/05/2015 0952   BUN 17 05/16/2023 0823   BUN 20.9 08/05/2015 0952   CREATININE 1.07 (H) 05/16/2023 0823   CREATININE 0.72 03/08/2023 1236   CREATININE 1.1 08/05/2015 0952      Component Value Date/Time   CALCIUM  9.4 05/16/2023 0823   CALCIUM  9.4 08/05/2015 0952   ALKPHOS 103 05/16/2023 0823   ALKPHOS 104 08/05/2015 0952   AST 12 (L) 05/16/2023 0823   AST 16 08/05/2015 0952   ALT 11 05/16/2023 0823   ALT 17 08/05/2015 0952   BILITOT 0.4 05/16/2023 0823   BILITOT <0.30 08/05/2015 9047       RADIOGRAPHIC STUDIES: I have personally reviewed the radiological images as listed and agreed with the findings in the report. DG Hip Unilat W or Wo  Pelvis 2-3 Views Left  Result Date: 05/15/2023 CLINICAL DATA:  Left hip pain. EXAM: DG HIP (WITH OR WITHOUT PELVIS) 2-3V LEFT COMPARISON:  None Available. FINDINGS: There is no evidence of hip fracture or dislocation. There is no evidence of arthropathy or other focal bone abnormality. IMPRESSION: Negative. Electronically Signed   By: Camellia Candle M.D.   On: 05/15/2023 07:03   MR Brain W Wo Contrast Result Date: 05/06/2023 CLINICAL DATA:  History of metastatic renal cell carcinoma. Staging. EXAM: MRI HEAD WITHOUT AND WITH CONTRAST TECHNIQUE: Multiplanar, multiecho pulse sequences of the brain and surrounding structures were obtained without and with intravenous contrast. CONTRAST:  8mL GADAVIST  GADOBUTROL  1 MMOL/ML IV SOLN COMPARISON:  Brain MRI 02/04/2023. FINDINGS: Brain: Punctate focus of acute infarction in the left occipital lobe (axial image 19 series 6). No acute hemorrhage or significant mass effect. No hydrocephalus or extra-axial collection. No mass or abnormal enhancement. No foci of abnormal susceptibility. Vascular: Normal flow voids and vessel enhancement. Skull and upper cervical spine: Normal marrow signal and enhancement. Sinuses/Orbits: No acute findings. Other: None. IMPRESSION: 1. Punctate focus of acute infarction in the left occipital lobe. 2. No evidence of intracranial metastatic disease. Electronically Signed   By: Ryan Chess M.D.   On: 05/06/2023 08:36   IR IMAGING GUIDED PORT INSERTION Result Date: 04/30/2023 CLINICAL DATA:  History of right breast carcinoma post mastectomy and axillary node dissection. More recent right nephrectomy for clear cell carcinoma, needs durable venous access for planned treatment regimen EXAM: TUNNELED PORT CATHETER PLACEMENT WITH ULTRASOUND AND FLUOROSCOPIC GUIDANCE FLUOROSCOPY: Radiation Exposure Index (as provided by the fluoroscopic device): 6 mGy air Kerma ANESTHESIA/SEDATION: Intravenous Fentanyl  100mcg and Versed  2mg  were administered by RN  during a total moderate (conscious) sedation time of 28 minutes; the patient's level of consciousness and physiological / cardiorespiratory status were monitored continuously by radiology RN under my direct supervision. TECHNIQUE: The procedure, risks, benefits, and alternatives were explained to the patient. Questions regarding the procedure were encouraged and answered. The patient understands and consents to the procedure. A left approach was selected due to previous right mastectomy and axillary lymph node dissection, after discussion with the patient. Patency of the left IJ vein was confirmed with ultrasound with image documentation. An appropriate skin site was determined. Skin site was marked. Region was prepped using maximum barrier technique including cap and mask, sterile gown, sterile gloves, large sterile sheet, and Chlorhexidine  as cutaneous antisepsis. The region was infiltrated locally with 1% lidocaine . Under real-time ultrasound guidance, the left IJ vein was accessed with a 21 gauge micropuncture needle; the needle tip within the vein was confirmed with ultrasound image documentation. Needle was exchanged over a 018 guidewire for transitional dilator, and vascular measurement was performed. A small incision was made on the left anterior chest wall and a subcutaneous pocket fashioned. The power-injectable port was positioned and its catheter tunneled to the left IJ dermatotomy site. The transitional dilator was exchanged over an Amplatz wire for a peel-away sheath, through which the port catheter, which had been trimmed to the appropriate length, was advanced and positioned under fluoroscopy with its tip at the cavoatrial junction. Spot chest radiograph confirms good catheter position and no pneumothorax. The port was flushed per protocol. The pocket was closed with deep interrupted and subcuticular continuous 3-0 Monocryl sutures. The incisions were covered with Dermabond then covered with a  sterile dressing. The patient tolerated the procedure well. COMPLICATIONS: COMPLICATIONS None immediate IMPRESSION: Technically successful left IJ power-injectable port catheter placement. Ready for routine use. Electronically  Signed   By: JONETTA Faes M.D.   On: 04/30/2023 15:07   ECHOCARDIOGRAM COMPLETE Result Date: 04/26/2023    ECHOCARDIOGRAM REPORT   Patient Name:   BRENAE LASECKI Date of Exam: 04/26/2023 Medical Rec #:  [id number]       Height:       67.0 in Accession #:    [id number]      Weight:       168.9 lb Date of Birth:  16-Jul-1940       BSA:          1.882 m Patient Age:    82 years        BP:           112/74 mmHg Patient Gender: F               HR:           101 bpm. Exam Location:  Outpatient Procedure: 2D Echo, 3D Echo, Color Doppler, Cardiac Doppler and Strain Analysis Indications:    Chemo  History:        Patient has no prior history of Echocardiogram examinations.  Sonographer:    Tinnie Gosling RDCS Referring Phys: [id number] Keivon Garden C Timoty Bourke IMPRESSIONS  1. Low normal EF with abnormal strain Suggest f/u MRI/TTE in 3 months.  2. Abnormal septal motion . Left ventricular ejection fraction, by estimation, is 50 to 55%. The left ventricle has low normal function. The left ventricle has no regional wall motion abnormalities. Left ventricular diastolic parameters are consistent with Grade I diastolic dysfunction (impaired relaxation). The average left ventricular global longitudinal strain is -11.3 %. The global longitudinal strain is abnormal.  3. Right ventricular systolic function is normal. The right ventricular size is normal.  4. The mitral valve is normal in structure. No evidence of mitral valve regurgitation. No evidence of mitral stenosis.  5. The aortic valve was not well visualized. Aortic valve regurgitation is not visualized. No aortic stenosis is present.  6. The inferior vena cava is normal in size with greater than 50% respiratory variability, suggesting right atrial pressure of 3  mmHg. FINDINGS  Left Ventricle: Abnormal septal motion. Left ventricular ejection fraction, by estimation, is 50 to 55%. The left ventricle has low normal function. The left ventricle has no regional wall motion abnormalities. The average left ventricular global longitudinal strain is -11.3 %. The global longitudinal strain is abnormal. The left ventricular internal cavity size was normal in size. There is no left ventricular hypertrophy. Left ventricular diastolic parameters are consistent with Grade I diastolic dysfunction (impaired relaxation). Right Ventricle: The right ventricular size is normal. No increase in right ventricular wall thickness. Right ventricular systolic function is normal. Left Atrium: Left atrial size was normal in size. Right Atrium: Right atrial size was normal in size. Pericardium: There is no evidence of pericardial effusion. Mitral Valve: The mitral valve is normal in structure. No evidence of mitral valve regurgitation. No evidence of mitral valve stenosis. Tricuspid Valve: The tricuspid valve is normal in structure. Tricuspid valve regurgitation is not demonstrated. No evidence of tricuspid stenosis. Aortic Valve: The aortic valve was not well visualized. Aortic valve regurgitation is not visualized. No aortic stenosis is present. Pulmonic Valve: The pulmonic valve was normal in structure. Pulmonic valve regurgitation is not visualized. No evidence of pulmonic stenosis. Aorta: The aortic root is normal in size and structure. Venous: The inferior vena cava is normal in size with greater than 50% respiratory variability, suggesting right atrial pressure of  3 mmHg. IAS/Shunts: No atrial level shunt detected by color flow Doppler. Additional Comments: Low normal EF with abnormal strain Suggest f/u MRI/TTE in 3 months.  LEFT VENTRICLE PLAX 2D LVIDd:         3.80 cm      Diastology LVIDs:         3.20 cm      LV e' medial:    7.51 cm/s LV PW:         1.00 cm      LV E/e' medial:  9.5 LV IVS:         0.90 cm      LV e' lateral:   14.80 cm/s LVOT diam:     2.10 cm      LV E/e' lateral: 4.8 LV SV:         43 LV SV Index:   23           2D Longitudinal Strain LVOT Area:     3.46 cm     2D Strain GLS Avg:     -11.3 %  LV Volumes (MOD) LV vol d, MOD A2C: 99.5 ml LV vol d, MOD A4C: 101.0 ml LV vol s, MOD A2C: 55.2 ml LV vol s, MOD A4C: 57.2 ml LV SV MOD A2C:     44.3 ml LV SV MOD A4C:     101.0 ml LV SV MOD BP:      43.4 ml RIGHT VENTRICLE             IVC RV S prime:     14.00 cm/s  IVC diam: 1.20 cm TAPSE (M-mode): 1.7 cm LEFT ATRIUM             Index        RIGHT ATRIUM          Index LA diam:        3.10 cm 1.65 cm/m   RA Area:     9.85 cm LA Vol (A2C):   43.1 ml 22.90 ml/m  RA Volume:   16.00 ml 8.50 ml/m LA Vol (A4C):   31.4 ml 16.68 ml/m LA Biplane Vol: 39.9 ml 21.20 ml/m  AORTIC VALVE LVOT Vmax:   86.70 cm/s LVOT Vmean:  58.300 cm/s LVOT VTI:    0.125 m  AORTA Ao Root diam: 3.10 cm Ao Asc diam:  3.30 cm MITRAL VALVE MV Area (PHT): 5.70 cm     SHUNTS MV Decel Time: 133 msec     Systemic VTI:  0.12 m MV E velocity: 71.60 cm/s   Systemic Diam: 2.10 cm MV A velocity: 137.00 cm/s MV E/A ratio:  0.52 Maude Emmer MD Electronically signed by Maude Emmer MD Signature Date/Time: 04/26/2023/12:01:54 PM    Final    CT Angio Chest PE W/Cm &/Or Wo Cm Result Date: 04/18/2023 CLINICAL DATA:  Patient underwent laparoscopic right nephrectomy for clear cell carcinoma 04/03/2023, presents with postoperative abdominal pain, flank and thoracic pain. She also had a right mastectomy in 2013 for carcinoma. EXAM: CT ANGIOGRAPHY CHEST CT ABDOMEN AND PELVIS WITH CONTRAST TECHNIQUE: Multidetector CT imaging of the chest was performed using the standard protocol during bolus administration of intravenous contrast. Multiplanar CT image reconstructions and MIPs were obtained to evaluate the vascular anatomy. Multidetector CT imaging of the abdomen and pelvis was performed using the standard protocol during bolus  administration of intravenous contrast. RADIATION DOSE REDUCTION: This exam was performed according to the departmental dose-optimization program which includes automated exposure control, adjustment  of the mA and/or kV according to patient size and/or use of iterative reconstruction technique. CONTRAST:  60mL OMNIPAQUE  IOHEXOL  350 MG/ML SOLN COMPARISON:  Portable chest today, chest radiograph 03/11/2023, PET-CT 02/26/2023, chest CT with contrast 02/01/2023, and CTA abdomen and pelvis also done 02/01/2023. FINDINGS: CTA CHEST FINDINGS Cardiovascular: The pulmonary trunk slightly prominent 2.8 cm suggesting arterial hypertension or fluid overload. No findings of acute right heart strain. There is no visible arterial embolus. There is atherosclerosis with mild scattered calcific plaque of the aorta and great vessels. There is no aneurysm, stenosis or dissection. Mild aortic tortuosity. The cardiac size is normal. No pericardial effusion. There is no visible coronary calcification. No venous dilatation. Mediastinum/Nodes: Interval increased prominence of AP window lymph nodes, largest is 10 mm short axis on 2:46. Previously was 5 mm. Interval increased prominence of subcarinal lymph nodes up to 1.2 cm in short axis on 2:61 with a few bilateral borderline prominent hilar nodes. There is no bulky or encasing adenopathy, no axillary adenopathy. Stable 1.5 cm heterogeneous right thyroid  nodule is again noted with dystrophic calcifications. This has not changed in size since the chest CT from January 2012 therefore not requiring follow-up. The thoracic trachea, the bilateral main bronchi, thoracic esophagus unremarkable. There is a tiny hiatal hernia. Lungs/Pleura: Progressive metastatic lung disease since the 02/26/2023 PET-CT. Most of the metastases show varying degrees of central necrosis. There is a new paramediastinal mass in the right upper lobe measuring 2.7 x 2.8 cm on 3:24. There is a new lobular pleural based mass  posteriorly in the right upper lobe measuring 3.7 x 3.9 cm on 3:31. Above this an interval enlarged mass measuring 2.1 x 1.7 cm on 3:24, previously 1.9 x 1.3 cm. Elsewhere, there are new nodules in the base of the right upper lobe measuring 1 cm and 0.5 cm both on 3:46; undo right middle lobe metastasis measuring 1.2 cm on 3:38; enlarging right lower lobe nodule measuring 1.4 cm on 3:83 which was only 5 mm previously; enlarging left lower lobe nodule measuring 2.6 x 1.3 cm on 3:74, previously 1.1 x 0.7 cm; enlarging left upper lobe nodule measuring 1.8 x 1.6 cm on 3:53, previously was 1.3 x 1 cm; and several other additional small new nodules scattered within the left upper and both lower lobes. There is linear atelectasis in the lung bases without focal consolidation. Mild bronchial thickening in both lower lobes. There are trace pleural effusions.  No pneumothorax. Musculoskeletal: No destructive bone lesions are seen, but there is a new nondisplaced fracture of the lateral right eighth rib which could be pathologic as there is lucency surrounding the fracture which was not seen previously. Review of the MIP images confirms the above findings. CT ABDOMEN and PELVIS FINDINGS Hepatobiliary: Gallbladder dilatation is similar to prior studies. The gallbladder is packed with stones, largest is 3.6 cm. No wall thickening or biliary dilatation is seen. There is no mass enhancement of the liver. Pancreas: Partially atrophic.  Otherwise unremarkable. Spleen: No abnormality. Adrenals/Urinary Tract: No adrenal mass. There are Bosniak 1 cysts of the left kidney, 2.1 cm in the lower pole and 1.4 cm midpole. Interval right nephrectomy. There is an elongate fluid collection in Morrison's pouch just below the level of the right adrenal gland measuring 6.4 x 1.8 cm and 12.5 Hounsfield units which is most likely a seroma. There is no space-occupying hematoma, no abscess is seen. No mass or adenopathy is evident in the right  nephrectomy bed. No urinary stone or obstruction.  There are low ureteral insertions consistent with pelvic floor laxity and a small cystocele again noted. The bladder is unremarkable. Stomach/Bowel: No dilatation or wall thickening. Normal appendix. Uncomplicated sigmoid diverticulosis. Endoscopy clips again noted in the ascending colon. Uncomplicated sigmoid diverticulosis. Vascular/Lymphatic: Aortic atherosclerosis. No enlarged abdominal or pelvic lymph nodes. Reproductive: Status post hysterectomy. No adnexal masses. Other: There is a heterogeneous low-density subcutaneous collection in the right upper quadrant, ranging 07/25/2023 Hounsfield units and measuring 7.5 x 3.7 x 10.5 cm in total. This is probably a hematoma related to 1 of the trocar insertion sites. Infectious process is not excluded. There is patchy soft tissue gas overlying the anterior wall on both sides of the abdomen beginning just below the ribcage on the right and continuing down over the low anterior pelvic wall bilaterally, where there is more moderate patchy subcutaneous air. This is not associated with any fluid collection and is probably related to the recent nephrectomy. Correlate clinically for infectious process. There is no pelvic ascites. There is no free hemorrhage or free air. No incarcerated hernia. Musculoskeletal: There is an ill-defined lytic lesion, most likely a metastasis, in the posterior left ilium measuring 2.9 cm on 5:70, which was hypermetabolic on the PET-CT. There is no other visible bone metastasis. There are degenerative changes of the spine. Review of the MIP images confirms the above findings. IMPRESSION: 1. No visible arterial embolus. 2. Prominent pulmonary trunk suggesting arterial hypertension or fluid overload. No findings of acute right heart strain. 3. Progressive metastatic lung disease with both new and interval enlarging masses. 4. New nondisplaced fracture of the lateral right eighth rib which could be  pathologic as there is lucency surrounding the fracture which was not seen previously. 5. Trace pleural effusions. 6. Interval right nephrectomy with 6.4 x 1.8 cm fluid collection in Morrison's pouch just below the level of the right adrenal gland, most likely a seroma. No space-occupying hematoma, no abscess is seen. 7. 7.5 x 3.7 x 10.5 cm heterogeneous low-density subcutaneous collection in the right upper quadrant, probably a hematoma related to 1 of the trocar insertion sites. Infectious process is not excluded. 8. Patchy soft tissue gas overlying the anterior wall on both sides of the abdomen and pelvis, probably related to the recent nephrectomy. Correlate clinically for infectious process. 9. 2.9 cm lytic lesion in the posterior left ilium, most likely a metastasis, which was hypermetabolic on the PET-CT. No other visible bone metastasis at the level of the abdomen and pelvis. 10. Cholelithiasis with stable gallbladder dilatation. 11. Pelvic floor laxity with low ureteral insertions and a small cystocele. 12. Diverticulosis without evidence of diverticulitis. 13. Aortic atherosclerosis. Aortic Atherosclerosis (ICD10-I70.0). Electronically Signed   By: Francis Quam M.D.   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 She also had a right mastectomy in 2013 for carcinoma. EXAM: CT ANGIOGRAPHY CHEST CT ABDOMEN AND PELVIS WITH CONTRAST TECHNIQUE: Multidetector CT imaging of the chest was performed using the standard protocol during bolus administration of intravenous contrast. Multiplanar CT image reconstructions and MIPs were obtained to evaluate the vascular anatomy. Multidetector CT imaging of the abdomen and pelvis was performed using the standard protocol during bolus administration of intravenous  contrast. RADIATION DOSE REDUCTION: This exam was performed according to the departmental dose-optimization program which includes automated exposure control, adjustment of the mA and/or kV according  to patient size and/or use of iterative reconstruction technique. CONTRAST:  60mL OMNIPAQUE  IOHEXOL  350 MG/ML SOLN COMPARISON:  Portable chest today, chest radiograph 03/11/2023, PET-CT 02/26/2023, chest CT with contrast 02/01/2023, and CTA abdomen and pelvis also done 02/01/2023. FINDINGS: CTA CHEST FINDINGS Cardiovascular: The pulmonary trunk slightly prominent 2.8 cm suggesting arterial hypertension or fluid overload. No findings of acute right heart strain. There is no visible arterial embolus. There is atherosclerosis with mild scattered calcific plaque of the aorta and great vessels. There is no aneurysm, stenosis or dissection. Mild aortic tortuosity. The cardiac size is normal. No pericardial effusion. There is no visible coronary calcification. No venous dilatation. Mediastinum/Nodes: Interval increased prominence of AP window lymph nodes, largest is 10 mm short axis on 2:46. Previously was 5 mm. Interval increased prominence of subcarinal lymph nodes up to 1.2 cm in short axis on 2:61 with a few bilateral borderline prominent hilar nodes. There is no bulky or encasing adenopathy, no axillary adenopathy. Stable 1.5 cm heterogeneous right thyroid  nodule is again noted with dystrophic calcifications. This has not changed in size since the chest CT from January 2012 therefore not requiring follow-up. The thoracic trachea, the bilateral main bronchi, thoracic esophagus unremarkable. There is a tiny hiatal hernia. Lungs/Pleura: Progressive metastatic lung disease since the 02/26/2023 PET-CT. Most of the metastases show varying degrees of central necrosis. There is a new paramediastinal mass in the right upper lobe measuring 2.7 x 2.8 cm on 3:24. There is a new lobular pleural based mass posteriorly in the right  upper lobe measuring 3.7 x 3.9 cm on 3:31. Above this an interval enlarged mass measuring 2.1 x 1.7 cm on 3:24, previously 1.9 x 1.3 cm. Elsewhere, there are new nodules in the base of the right upper lobe measuring 1 cm and 0.5 cm both on 3:46; undo right middle lobe metastasis measuring 1.2 cm on 3:38; enlarging right lower lobe nodule measuring 1.4 cm on 3:83 which was only 5 mm previously; enlarging left lower lobe nodule measuring 2.6 x 1.3 cm on 3:74, previously 1.1 x 0.7 cm; enlarging left upper lobe nodule measuring 1.8 x 1.6 cm on 3:53, previously was 1.3 x 1 cm; and several other additional small new nodules scattered within the left upper and both lower lobes. There is linear atelectasis in the lung bases without focal consolidation. Mild bronchial thickening in both lower lobes. There are trace pleural effusions.  No pneumothorax. Musculoskeletal: No destructive bone lesions are seen, but there is a new nondisplaced fracture of the lateral right eighth rib which could be pathologic as there is lucency surrounding the fracture which was not seen previously. Review of the MIP images confirms the above findings. CT ABDOMEN and PELVIS FINDINGS Hepatobiliary: Gallbladder dilatation is similar to prior studies. The gallbladder is packed with stones, largest is 3.6 cm. No wall thickening or biliary dilatation is seen. There is no mass enhancement of the liver. Pancreas: Partially atrophic.  Otherwise unremarkable. Spleen: No abnormality. Adrenals/Urinary Tract: No adrenal mass. There are Bosniak 1 cysts of the left kidney, 2.1 cm in the lower pole and 1.4 cm midpole. Interval right nephrectomy. There is an elongate fluid collection in Morrison's pouch just below the level of the right adrenal gland measuring 6.4 x 1.8 cm and 12.5 Hounsfield units which is most likely a seroma. There is no space-occupying hematoma, no abscess is seen. No mass or adenopathy is evident in the right nephrectomy bed. No urinary  stone or obstruction. There are low ureteral insertions consistent  with pelvic floor laxity and a small cystocele again noted. The bladder is unremarkable. Stomach/Bowel: No dilatation or wall thickening. Normal appendix. Uncomplicated sigmoid diverticulosis. Endoscopy clips again noted in the ascending colon. Uncomplicated sigmoid diverticulosis. Vascular/Lymphatic: Aortic atherosclerosis. No enlarged abdominal or pelvic lymph nodes. Reproductive: Status post hysterectomy. No adnexal masses. Other: There is a heterogeneous low-density subcutaneous collection in the right upper quadrant, ranging 07/25/2023 Hounsfield units and measuring 7.5 x 3.7 x 10.5 cm in total. This is probably a hematoma related to 1 of the trocar insertion sites. Infectious process is not excluded. There is patchy soft tissue gas overlying the anterior wall on both sides of the abdomen beginning just below the ribcage on the right and continuing down over the low anterior pelvic wall bilaterally, where there is more moderate patchy subcutaneous air. This is not associated with any fluid collection and is probably related to the recent nephrectomy. Correlate clinically for infectious process. There is no pelvic ascites. There is no free hemorrhage or free air. No incarcerated hernia. Musculoskeletal: There is an ill-defined lytic lesion, most likely a metastasis, in the posterior left ilium measuring 2.9 cm on 5:70, which was hypermetabolic on the PET-CT. There is no other visible bone metastasis. There are degenerative changes of the spine. Review of the MIP images confirms the above findings. IMPRESSION: 1. No visible arterial embolus. 2. Prominent pulmonary trunk suggesting arterial hypertension or fluid overload. No findings of acute right heart strain. 3. Progressive metastatic lung disease with both new and interval enlarging masses. 4. New nondisplaced fracture of the lateral right eighth rib which could be pathologic as there is  lucency surrounding the fracture which was not seen previously. 5. Trace pleural effusions. 6. Interval right nephrectomy with 6.4 x 1.8 cm fluid collection in Morrison's pouch just below the level of the right adrenal gland, most likely a seroma. No space-occupying hematoma, no abscess is seen. 7. 7.5 x 3.7 x 10.5 cm heterogeneous low-density subcutaneous collection in the right upper quadrant, probably a hematoma related to 1 of the trocar insertion sites. Infectious process is not excluded. 8. Patchy soft tissue gas overlying the anterior wall on both sides of the abdomen and pelvis, probably related to the recent nephrectomy. Correlate clinically for infectious process. 9. 2.9 cm lytic lesion in the posterior left ilium, most likely a metastasis, which was hypermetabolic on the PET-CT. No other visible bone metastasis at the level of the abdomen and pelvis. 10. Cholelithiasis with stable gallbladder dilatation. 11. Pelvic floor laxity with low ureteral insertions and a small cystocele. 12. Diverticulosis without evidence of diverticulitis. 13. Aortic atherosclerosis. Aortic Atherosclerosis (ICD10-I70.0). Electronically Signed   By: Francis Quam M.D.   On: 04/18/2023 23:38   DG Chest Portable 1 View Result Date: 04/18/2023 CLINICAL DATA:  Right flank pain. Cough. History of renal cell carcinoma with pulmonary metastatic disease. EXAM: PORTABLE CHEST 1 VIEW COMPARISON:  Radiographs 03/11/2023 and 02/12/2023. PET-CT 02/26/2023. FINDINGS: 1435 hours. Interval improved aeration of both lungs with decreased bilateral pulmonary opacities since 03/11/2023. There is mild residual left lower lobe atelectasis. Multiple pulmonary nodules are again noted which appear progressive. There is no pleural effusion or pneumothorax. The heart size and mediastinal contours are stable. IMPRESSION: 1. Interval improved aeration of both lungs with decreased bilateral pulmonary opacities since 03/11/2023. 2. Progressive pulmonary  metastatic disease. 3. No definite acute findings. Electronically Signed   By: Elsie Perone M.D.   On: 04/18/2023 15:32

## 2023-05-14 NOTE — Assessment & Plan Note (Addendum)
 Continue treatment for kidney cancer with len/pembro

## 2023-05-14 NOTE — Assessment & Plan Note (Signed)
 Drug monitoring for lenvatinib. Monitor for hypertension Baseline EKG normal QTc LFT, renal function, electrolytes, every 2 weeks for 2 months and then monthly thereafter Baseline TSH and free T4 and then monthly urine dipstick; if 2+ then obtain a 24-hour urine protein Dental exam before and periodically Monitor for signs of bleeding, wound healing, thrombosis Follow up on 12/30 after lab as scheduled.

## 2023-05-14 NOTE — Assessment & Plan Note (Signed)
 Continue Marinol 5 mg twice daily. Refill Prescription sent to her pharmacy.

## 2023-05-15 ENCOUNTER — Other Ambulatory Visit: Payer: Self-pay

## 2023-05-15 ENCOUNTER — Emergency Department (HOSPITAL_COMMUNITY)
Admission: EM | Admit: 2023-05-15 | Discharge: 2023-05-15 | Disposition: A | Discharge status: Home / Self Care | Payer: Medicare HMO | Attending: Emergency Medicine | Admitting: Emergency Medicine

## 2023-05-15 ENCOUNTER — Other Ambulatory Visit (HOSPITAL_BASED_OUTPATIENT_CLINIC_OR_DEPARTMENT_OTHER): Payer: Self-pay

## 2023-05-15 ENCOUNTER — Emergency Department (HOSPITAL_COMMUNITY): Payer: Medicare HMO

## 2023-05-15 DIAGNOSIS — I10 Essential (primary) hypertension: Secondary | ICD-10-CM | POA: Diagnosis not present

## 2023-05-15 DIAGNOSIS — Z85528 Personal history of other malignant neoplasm of kidney: Secondary | ICD-10-CM | POA: Insufficient documentation

## 2023-05-15 DIAGNOSIS — Z853 Personal history of malignant neoplasm of breast: Secondary | ICD-10-CM | POA: Diagnosis not present

## 2023-05-15 DIAGNOSIS — Z79899 Other long term (current) drug therapy: Secondary | ICD-10-CM | POA: Insufficient documentation

## 2023-05-15 DIAGNOSIS — M25552 Pain in left hip: Secondary | ICD-10-CM | POA: Insufficient documentation

## 2023-05-15 DIAGNOSIS — C7951 Secondary malignant neoplasm of bone: Secondary | ICD-10-CM

## 2023-05-15 DIAGNOSIS — G893 Neoplasm related pain (acute) (chronic): Secondary | ICD-10-CM | POA: Insufficient documentation

## 2023-05-15 MED ORDER — DRONABINOL 5 MG PO CAPS
5.0000 mg | ORAL_CAPSULE | Freq: Two times a day (BID) | ORAL | 0 refills | Status: DC
  Filled 2023-05-15: qty 30, 15d supply, fill #0

## 2023-05-15 NOTE — ED Provider Notes (Signed)
 [place] EMERGENCY DEPARTMENT AT [facility] Provider Note   CSN: [id number] Arrival date & time: 05/15/23  0451     History  Chief Complaint  Patient presents with   Hip Pain    Diane Paul is a 83 y.o. female.  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  Pain has been present for about a week, worsening with standing/bearing weight. Located left side/buttock.  No falls/trauma. No fever, no back pain.  No numbness, weakness, loss of control of bowel or bladder.  She does have a sore in her gluteal cleft.  She has been able to ride the stationary bike without having pain with movements, but when she goes to bear weight she has more severe pain.   Did have an ill-defined lytic lesion, likely metastases posterior left ilium which was hypermetabolic on PET_CT on CT abd pelvis 12/12.   Past Medical History:  Diagnosis Date   Anemia    Anxiety    nervous sometimes, denies panica ttacks    Arthritis    knees ?   Blood transfusion    post vaginal birth- 1960, 01/2023- anemia   Breast cancer (HCC)    Right Breast Cancer   Cancer (HCC)    R breast cancer   Dry mouth    GERD (gastroesophageal reflux disease)    Hypertension    Neuromuscular disorder (HCC)    L leg, thigh- burns sometimes   Shortness of breath    sometimes    Use of letrozole  (Femara ) 05/07/2010   neoadjuvant femara  therapy since 1/212    Home Medications Prior to Admission medications   Medication Sig Start Date End Date Taking? Authorizing Provider  amLODipine  (NORVASC ) 5 MG tablet Take 5 mg by mouth daily. Patient not taking: Reported on 04/08/2023    [provider]  dronabinol  (MARINOL ) 5 MG capsule Take 1 capsule (5 mg total) by mouth 2 (two) times daily before a meal.  04/29/23   Sherrod Sherrod, MD  Ensure (ENSURE) Take 237 mLs by mouth 2 (two) times daily between meals.    [provider]  HYDROcodone -acetaminophen  (NORCO) 5-325 MG tablet Take 1 tablet by mouth every 6 (six) hours as needed for moderate pain (pain score 4-6) or severe pain (pain score 7-10). 04/23/23   Duanne Butler DASEN, MD  lenvatinib  10 mg daily dose (LENVIMA ) capsule Take 1 capsule (10 mg total) by mouth daily. 04/24/23   Tina Pauletta BROCKS, MD  lidocaine -prilocaine  (EMLA ) cream Apply 1 Application topically as needed. Apply to port ~ 1 hour prior to access 05/13/23   Tina Pauletta BROCKS, MD  ondansetron  (ZOFRAN ) 8 MG tablet Take 1 tablet (8 mg total) by mouth every 8 (eight) hours as needed for nausea or vomiting. 04/23/23   Tina Pauletta BROCKS, MD  prochlorperazine  (COMPAZINE ) 10 MG tablet Take 1 tablet (10 mg total) by mouth every 6 (six) hours as needed for nausea or vomiting. 04/23/23   Tina Pauletta BROCKS, MD  senna-docusate (SENOKOT-S) 8.6-50 MG tablet Take 1 tablet by mouth 2 (two) times daily. While taking strong pain meds to prevent constipation 04/03/23   Manny, Ricardo KATHEE Raddle., MD      Allergies    Fosamax  [alendronate  sodium]    Review of Systems   Review of Systems  Physical Exam  Updated Vital Signs BP 138/85 (BP Location: Left Arm)   Pulse 72   Temp 98.2 F (36.8 C) (Oral)   Resp 16   Ht 5' 7 (1.702 m)   Wt 79.4 kg   SpO2 96%   BMI 27.41 kg/m  Physical Exam Vitals and nursing note reviewed.  Constitutional:      General: She is not in acute distress.    Appearance: She is well-developed. She is not diaphoretic.  HENT:     Head: Normocephalic and atraumatic.  Eyes:     Conjunctiva/sclera: Conjunctivae normal.  Cardiovascular:     Rate and Rhythm: Normal rate and regular rhythm.     Pulses: Normal pulses.  Pulmonary:     Effort: Pulmonary effort is normal. No respiratory distress.  Abdominal:     General: There is no distension.     Palpations: Abdomen is  soft.     Tenderness: There is no abdominal tenderness. There is no guarding.  Musculoskeletal:        General: Tenderness (left buttock, no tenderness to lumbar spine) present.     Cervical back: Normal range of motion.  Skin:    General: Skin is warm and dry.     Findings: No erythema or rash.     Comments: Slight ulceration/pressure sore gluteal cleft 1cm  Neurological:     Mental Status: She is alert and oriented to person, place, and time.     Motor: No weakness (normal strength bilatera LE, normal sesnsation).     ED Results / Procedures / Treatments   Labs (all labs ordered are listed, but only abnormal results are displayed) Labs Reviewed - No data to display  EKG None  Radiology DG Hip Unilat W or Wo Pelvis 2-3 Views Left Result Date: 05/15/2023 CLINICAL DATA:  Left hip pain. EXAM: DG HIP (WITH OR WITHOUT PELVIS) 2-3V LEFT COMPARISON:  None Available. FINDINGS: There is no evidence of hip fracture or dislocation. There is no evidence of arthropathy or other focal bone abnormality. IMPRESSION: Negative. Electronically Signed   By: Camellia Candle M.D.   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  Did discuss her incidentally noted occipital infarct on MRI 12/19 and offered discussion with neurology from the emergency department//consideration of expedited workup/admission, however in setting of her metastatic cancer, her being asymptomatic, not being a candidate for anticoagulation per oncology note, this noted several weeks ago, also feel that outpatient management or palliative approach is  also not unreasonable.  Regarding hip pain: Have low suspicion for acute arterial thrombus, DVT, sepsis, septic arthritis, acute surgical spinal pathology.  X-ray shows no evidence of fracture or dislocation.  Did have CT 1212 showing likely lytic lesion in the posterior left ilium, which was hypermetabolic on PET/CT concerning for metastatic disease.  Given this location I suspect this is likely etiology of her pain.  Recommend increasing the frequency of her Norco, using lidocaine  patch, and following up closely with her oncologist for  further discussion of pain control/consideration of other therapies.  Patient discharged in stable condition with understanding of reasons to return.          Final Clinical Impression(s) / ED Diagnoses Final diagnoses:  Left hip pain  Pain from bone metastases [facility])    Rx / DC Orders ED Discharge Orders     None         Dreama Longs, MD 05/15/23 1003

## 2023-05-15 NOTE — Discharge Instructions (Signed)
 You may use lidocaine patch from over the counter, increase frequency of your hydrocodone to every 4-6 hours as needed for pain.

## 2023-05-15 NOTE — ED Triage Notes (Signed)
 Pt complains of left hip pain x 1 week. Denies injury.

## 2023-05-16 ENCOUNTER — Inpatient Hospital Stay: Payer: Medicare HMO

## 2023-05-16 ENCOUNTER — Other Ambulatory Visit: Payer: Self-pay

## 2023-05-16 ENCOUNTER — Other Ambulatory Visit: Payer: Medicare HMO

## 2023-05-16 ENCOUNTER — Other Ambulatory Visit (HOSPITAL_BASED_OUTPATIENT_CLINIC_OR_DEPARTMENT_OTHER): Payer: Self-pay

## 2023-05-16 VITALS — BP 130/78 | HR 80 | Temp 97.7°F | Resp 16 | Wt 173.1 lb

## 2023-05-16 DIAGNOSIS — R630 Anorexia: Secondary | ICD-10-CM

## 2023-05-16 DIAGNOSIS — Z905 Acquired absence of kidney: Secondary | ICD-10-CM | POA: Diagnosis not present

## 2023-05-16 DIAGNOSIS — C7802 Secondary malignant neoplasm of left lung: Secondary | ICD-10-CM | POA: Diagnosis not present

## 2023-05-16 DIAGNOSIS — C7951 Secondary malignant neoplasm of bone: Secondary | ICD-10-CM | POA: Diagnosis not present

## 2023-05-16 DIAGNOSIS — C7801 Secondary malignant neoplasm of right lung: Secondary | ICD-10-CM | POA: Insufficient documentation

## 2023-05-16 DIAGNOSIS — Z79620 Long term (current) use of immunosuppressive biologic: Secondary | ICD-10-CM | POA: Insufficient documentation

## 2023-05-16 DIAGNOSIS — M25552 Pain in left hip: Secondary | ICD-10-CM | POA: Insufficient documentation

## 2023-05-16 DIAGNOSIS — Z5112 Encounter for antineoplastic immunotherapy: Secondary | ICD-10-CM | POA: Diagnosis not present

## 2023-05-16 DIAGNOSIS — I639 Cerebral infarction, unspecified: Secondary | ICD-10-CM | POA: Insufficient documentation

## 2023-05-16 DIAGNOSIS — C3412 Malignant neoplasm of upper lobe, left bronchus or lung: Secondary | ICD-10-CM | POA: Diagnosis not present

## 2023-05-16 DIAGNOSIS — Z853 Personal history of malignant neoplasm of breast: Secondary | ICD-10-CM | POA: Diagnosis not present

## 2023-05-16 DIAGNOSIS — C641 Malignant neoplasm of right kidney, except renal pelvis: Secondary | ICD-10-CM | POA: Insufficient documentation

## 2023-05-16 DIAGNOSIS — Z9189 Other specified personal risk factors, not elsewhere classified: Secondary | ICD-10-CM

## 2023-05-16 DIAGNOSIS — Z8673 Personal history of transient ischemic attack (TIA), and cerebral infarction without residual deficits: Secondary | ICD-10-CM | POA: Insufficient documentation

## 2023-05-16 LAB — CBC WITH DIFFERENTIAL (CANCER CENTER ONLY)
Abs Immature Granulocytes: 0.05 10*3/uL (ref 0.00–0.07)
Basophils Absolute: 0.1 10*3/uL (ref 0.0–0.1)
Basophils Relative: 1 %
Eosinophils Absolute: 0.3 10*3/uL (ref 0.0–0.5)
Eosinophils Relative: 4 %
HCT: 32.5 % — ABNORMAL LOW (ref 36.0–46.0)
Hemoglobin: 9.8 g/dL — ABNORMAL LOW (ref 12.0–15.0)
Immature Granulocytes: 1 %
Lymphocytes Relative: 27 %
Lymphs Abs: 2.2 10*3/uL (ref 0.7–4.0)
MCH: 24.7 pg — ABNORMAL LOW (ref 26.0–34.0)
MCHC: 30.2 g/dL (ref 30.0–36.0)
MCV: 82.1 fL (ref 80.0–100.0)
Monocytes Absolute: 1 10*3/uL (ref 0.1–1.0)
Monocytes Relative: 13 %
Neutro Abs: 4.5 10*3/uL (ref 1.7–7.7)
Neutrophils Relative %: 54 %
Platelet Count: 391 10*3/uL (ref 150–400)
RBC: 3.96 MIL/uL (ref 3.87–5.11)
RDW: 19.8 % — ABNORMAL HIGH (ref 11.5–15.5)
WBC Count: 8.1 10*3/uL (ref 4.0–10.5)
nRBC: 0 % (ref 0.0–0.2)

## 2023-05-16 LAB — CMP (CANCER CENTER ONLY)
ALT: 11 U/L (ref 0–44)
AST: 12 U/L — ABNORMAL LOW (ref 15–41)
Albumin: 3.2 g/dL — ABNORMAL LOW (ref 3.5–5.0)
Alkaline Phosphatase: 103 U/L (ref 38–126)
Anion gap: 7 (ref 5–15)
BUN: 17 mg/dL (ref 8–23)
CO2: 30 mmol/L (ref 22–32)
Calcium: 9.4 mg/dL (ref 8.9–10.3)
Chloride: 99 mmol/L (ref 98–111)
Creatinine: 1.07 mg/dL — ABNORMAL HIGH (ref 0.44–1.00)
GFR, Estimated: 52 mL/min — ABNORMAL LOW (ref >60)
Glucose, Bld: 105 mg/dL — ABNORMAL HIGH (ref 70–99)
Potassium: 4.8 mmol/L (ref 3.5–5.1)
Sodium: 136 mmol/L (ref 135–145)
Total Bilirubin: 0.4 mg/dL (ref 0.0–1.2)
Total Protein: 7.8 g/dL (ref 6.5–8.1)

## 2023-05-16 LAB — LACTATE DEHYDROGENASE: LDH: 234 U/L — ABNORMAL HIGH (ref 98–192)

## 2023-05-16 LAB — TOTAL PROTEIN, URINE DIPSTICK: Protein, ur: NEGATIVE mg/dL

## 2023-05-16 MED ORDER — HYDROCODONE-ACETAMINOPHEN 5-325 MG PO TABS
1.0000 | ORAL_TABLET | Freq: Four times a day (QID) | ORAL | 0 refills | Status: DC | PRN
  Filled 2023-05-16: qty 120, 30d supply, fill #0

## 2023-05-16 MED ORDER — SODIUM CHLORIDE 0.9 % IV SOLN
INTRAVENOUS | Status: DC
Start: 2023-05-16 — End: 2023-05-16

## 2023-05-16 MED ORDER — SODIUM CHLORIDE 0.9 % IV SOLN
200.0000 mg | Freq: Once | INTRAVENOUS | Status: AC
  Administered 2023-05-16: 200 mg via INTRAVENOUS
  Filled 2023-05-16: qty 200

## 2023-05-16 NOTE — Assessment & Plan Note (Signed)
 Currently not a candidate for anticoagulation or aspirin due to high risk of bleeding and hemorrhage and wound not completely healed Asymptomatic.  Will continue monitoring.

## 2023-05-16 NOTE — Assessment & Plan Note (Signed)
 No fracture on XR Check CT noncontrast for evaluation

## 2023-05-16 NOTE — Patient Instructions (Signed)
 [facility] - A DEPT OF [facility]  Discharge Instructions: Thank you for choosing [facility] to provide your oncology and hematology care.   If you have a lab appointment with the Cancer Center, please go directly to the Cancer Center and check in at the registration area.   Wear comfortable clothing and clothing appropriate for easy access to any Portacath or PICC line.   We strive to give you quality time with your provider. You may need to reschedule your appointment if you arrive late (15 or more minutes).  Arriving late affects you and other patients whose appointments are after yours.  Also, if you miss three or more appointments without notifying the office, you may be dismissed from the clinic at the provider's discretion.      For prescription refill requests, have your pharmacy contact our office and allow 72 hours for refills to be completed.    Today you received the following chemotherapy and/or immunotherapy agents: pembrolizumab      To help prevent nausea and vomiting after your treatment, we encourage you to take your nausea medication as directed.  BELOW ARE SYMPTOMS THAT SHOULD BE REPORTED IMMEDIATELY: *FEVER GREATER THAN 100.4 F (38 C) OR HIGHER *CHILLS OR SWEATING *NAUSEA AND VOMITING THAT IS NOT CONTROLLED WITH YOUR NAUSEA MEDICATION *UNUSUAL SHORTNESS OF BREATH *UNUSUAL BRUISING OR BLEEDING *URINARY PROBLEMS (pain or burning when urinating, or frequent urination) *BOWEL PROBLEMS (unusual diarrhea, constipation, pain near the anus) TENDERNESS IN MOUTH AND THROAT WITH OR WITHOUT PRESENCE OF ULCERS (sore throat, sores in mouth, or a toothache) UNUSUAL RASH, SWELLING OR PAIN  UNUSUAL VAGINAL DISCHARGE OR ITCHING   Items with * indicate a potential emergency and should be followed up as soon as possible or go to the Emergency Department if any problems should occur.  Please show the CHEMOTHERAPY ALERT CARD or  IMMUNOTHERAPY ALERT CARD at check-in to the Emergency Department and triage nurse.  Should you have questions after your visit or need to cancel or reschedule your appointment, please contact [facility] - A DEPT OF [facility]  Dept: [phone]  and follow the prompts.  Office hours are 8:00 a.m. to 4:30 p.m. Monday - Friday. Please note that voicemails left after 4:00 p.m. may not be returned until the following business day.  We are closed weekends and major holidays. You have access to a nurse at all times for urgent questions. Please call the main number to the clinic Dept: [phone] and follow the prompts.   For any non-urgent questions, you may also contact your provider using MyChart. We now offer e-Visits for anyone 78 and older to request care online for non-urgent symptoms. For details visit [email or web address].   Also download the MyChart app! Go to the app store, search "MyChart", open the app, select [place], and log in with your MyChart username and password.

## 2023-05-16 NOTE — Assessment & Plan Note (Signed)
 continue lenvatinib started on 12/31 Continue with pembrolizumab lenvatinib at 10 mg daily for now

## 2023-05-17 ENCOUNTER — Other Ambulatory Visit: Payer: Self-pay

## 2023-05-21 ENCOUNTER — Ambulatory Visit (HOSPITAL_COMMUNITY): Payer: Medicare HMO

## 2023-05-22 ENCOUNTER — Other Ambulatory Visit (HOSPITAL_COMMUNITY): Payer: Self-pay

## 2023-05-22 ENCOUNTER — Ambulatory Visit (HOSPITAL_COMMUNITY): Payer: Medicare HMO

## 2023-05-22 NOTE — Progress Notes (Signed)
Patient Care Team: Donita Brooks, MD as PCP - General (Family Medicine)  Clinic Day:  05/24/2023  Referring physician: Donita Brooks, MD  ASSESSMENT & PLAN:   Assessment & Plan: 83 y.o.w. 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   Diagnosis: stage IV clear cell RCC Treatment: Right nephrectomy. 04/26/23 started pembrolizumab. Delayed started of Lenvatinib at 10 mg daily due to wound on 05/07/23  She gets call for lenvatinib.  Metastatic renal cell carcinoma, right (HCC) continue lenvatinib started on 12/31 Continue with pembrolizumab lenvatinib at 10 mg daily for now  Left hip pain No fracture on XR Bone scan scheduled insurance declined CT first Norco as needed. Refilled   Hypertension Off amlodipine without worsening HTN mostly 130 SBP  Decreased appetite Continue Marinol 5 mg twice daily. Refill Prescription sent to her pharmacy.   At risk for side effect of medication Drug monitoring for lenvatinib. Monitor for hypertension Baseline EKG normal QTc LFT, renal function, electrolytes, every 2 weeks for 2 months and then monthly thereafter Baseline TSH and free T4 and then monthly urine dipstick; if 2+ then obtain a 24-hour urine protein Dental exam before and periodically Monitor for signs of bleeding, wound healing, thrombosis    The patient understands the plans discussed today and is in agreement with them.  She knows to contact our office if she develops concerns prior to her next appointment.  [name], MD  [facility] - A DEPT OF [facility] [address] [place] [address] Dept: [phone] Dept Fax: [phone]   Orders Placed This Encounter  Procedures   NM Bone Scan Whole Body    Standing Status:   Future    Expected Date:   05/24/2023    Expiration Date:    05/21/2024    If indicated for the ordered procedure, I authorize the administration of a radiopharmaceutical per Radiology protocol:   Yes    Preferred imaging location?:   [facility]      CHIEF COMPLAINT:  CC: mRCC  Current Treatment:  len/pembro  INTERVAL HISTORY:  Genivieve is here today for repeat clinical assessment. She denies fevers or chills. She uses Norco for pain. Her appetite improved on Marinol.  No wound drainage. It's getting better. No redness or pain.  BP at home <140 SBP  No difficulty urinating and good u/o.  No palpitation.  No edema, chest pain, short of breath, leg swelling, focal weakness, bleeding, diarrhea.  Dental issue: no teeth  I have reviewed the past medical history, past surgical history, social history and family history with the patient and they are unchanged from previous note.  ALLERGIES:  is allergic to fosamax [alendronate sodium].  MEDICATIONS:  Current Outpatient Medications  Medication Sig Dispense Refill   amLODipine (NORVASC) 5 MG tablet Take 5 mg by mouth daily. (Patient not taking: Reported on 04/08/2023)     dronabinol (MARINOL) 5 MG capsule Take 1 capsule (5 mg total) by mouth 2 (two) times daily before a meal. 30 capsule 0   Ensure (ENSURE) Take 237 mLs by mouth 2 (two) times daily between meals.     HYDROcodone-acetaminophen (NORCO) 5-325 MG tablet Take 1 tablet by mouth every 6 (six) hours as needed for moderate pain (pain score 4-6) or severe pain (pain score 7-10). 120 tablet 0  lenvatinib 10 mg daily dose (LENVIMA) capsule Take 1 capsule (10 mg total) by mouth daily. 30 capsule 0   lidocaine-prilocaine (EMLA) cream Apply 1 Application topically as needed. Apply to port ~ 1 hour prior to access 30 g 0   ondansetron (ZOFRAN) 8 MG tablet Take 1 tablet (8 mg total) by mouth every 8 (eight) hours as needed for nausea or vomiting. 30 tablet 1   prochlorperazine (COMPAZINE) 10 MG tablet Take 1 tablet (10 mg total) by mouth  every 6 (six) hours as needed for nausea or vomiting. 30 tablet 1   senna-docusate (SENOKOT-S) 8.6-50 MG tablet Take 1 tablet by mouth 2 (two) times daily. While taking strong pain meds to prevent constipation 10 tablet 0   No current facility-administered medications for this visit.    HISTORY OF PRESENT ILLNESS:   Oncology History  Breast cancer of upper-outer quadrant of right female breast (HCC)  06/11/2011 Surgery   Right breast mastectomy with right axillary sentinel lymph node biopsy 1.3 cm invasive lobular cancer, grade 1, atypical lobular hyperplasia, focal LV I, ER 91%, PR 25%, HER-2 negative, 0/8 lymph nodes T1 cN0 stage IA   07/25/2011 -  Anti-estrogen oral therapy   Letrozole 2.5 mg daily 7 years was a plan   Metastatic renal cell carcinoma, right (HCC)  02/01/2023 Initial Diagnosis   Renal cell carcinoma (HCC)   02/05/2023 Imaging   Bone scan: No evidence of osseous metastatic disease.    04/03/2023 Pathology Results   A. KIDNEY, RIGHT, RADICAL NEPHRECTOMY:  Clear cell renal cell carcinoma with rhabdoid, sarcomatoid Features and  tumor necrosis, nuclear grade 4, size 12.0 cm  Tumor extends into perinephric, renal sinus fat and segmental branches  of renal vein (pT3a)  Ureteral, vascular and all margins of resection are negative for tumor    Benign adrenal gland    04/23/2023 - 04/23/2023 Chemotherapy   Patient is on Treatment Plan : RENAL CELL Pembrolizumab (200) + Axitinib q21d     04/25/2023 -  Chemotherapy   Patient is on Treatment Plan : Clear Cell RCC Pembrolizumab (200) q21d         REVIEW OF SYSTEMS:   All relevant systems were reviewed with the patient and are negative.   VITALS:  Blood pressure 135/88, temperature 97.8 F (36.6 C), temperature source Temporal, resp. rate 16, weight 175 lb (79.4 kg), SpO2 100%.  Wt Readings from Last 3 Encounters:  05/24/23 175 lb (79.4 kg)  05/16/23 173 lb 1.6 oz (78.5 kg)  05/15/23 175 lb (79.4 kg)    Body  mass index is 27.41 kg/m.  Performance status (ECOG): 2 - Symptomatic, <50% confined to bed  PHYSICAL EXAM:   GENERAL:alert, no distress and comfortable SKIN: skin color normal, no rashes  EYES: normal, sclera clear OROPHARYNX: no exudate, no erythema    NECK: supple,  non-tender, without nodularity LYMPH:  no palpable cervical lymphadenopathy LUNGS: clear to auscultation with normal breathing effort.  No wheeze or rales HEART: regular rate & rhythm and no murmurs and no lower extremity edema ABDOMEN: abdomen soft, non-tender and nondistended Musculoskeletal: no edema NEURO: alert, fluent speech, no focal motor/sensory deficits.  Strength and sensation equal bilaterally.  LABORATORY DATA:  I have reviewed the data as listed    Component Value Date/Time   NA 135 05/24/2023 0832   NA 141 08/05/2015 0952   K 4.7 05/24/2023 0832   K 4.1 08/05/2015 0952   CL 100 05/24/2023 0832   CL 108 (H) 09/19/2012  1156   CO2 30 05/24/2023 0832   CO2 24 08/05/2015 0952   GLUCOSE 116 (H) 05/24/2023 0832   GLUCOSE 109 08/05/2015 0952   GLUCOSE 101 (H) 09/19/2012 1156   BUN 24 (H) 05/24/2023 0832   BUN 20.9 08/05/2015 0952   CREATININE 1.14 (H) 05/24/2023 0832   CREATININE 0.72 03/08/2023 1236   CREATININE 1.1 08/05/2015 0952   CALCIUM 9.1 05/24/2023 0832   CALCIUM 9.4 08/05/2015 0952   PROT 7.5 05/24/2023 0832   PROT 7.8 08/05/2015 0952   ALBUMIN 3.4 (L) 05/24/2023 0832   ALBUMIN 3.7 08/05/2015 0952   AST 23 05/24/2023 0832   AST 16 08/05/2015 0952   ALT 21 05/24/2023 0832   ALT 17 08/05/2015 0952   ALKPHOS 122 05/24/2023 0832   ALKPHOS 104 08/05/2015 0952   BILITOT 0.3 05/24/2023 0832   BILITOT <0.30 08/05/2015 0952   GFRNONAA 48 (L) 05/24/2023 0832   GFRNONAA 39 (L) 08/12/2020 1502   GFRAA 45 (L) 08/12/2020 1502    No results found for: "SPEP", "UPEP"  Lab Results  Component Value Date   WBC 6.8 05/24/2023   NEUTROABS 3.5 05/24/2023   HGB 10.6 (L) 05/24/2023   HCT 35.6  (L) 05/24/2023   MCV 84.8 05/24/2023   PLT 322 05/24/2023      Chemistry      Component Value Date/Time   NA 135 05/24/2023 0832   NA 141 08/05/2015 0952   K 4.7 05/24/2023 0832   K 4.1 08/05/2015 0952   CL 100 05/24/2023 0832   CL 108 (H) 09/19/2012 1156   CO2 30 05/24/2023 0832   CO2 24 08/05/2015 0952   BUN 24 (H) 05/24/2023 0832   BUN 20.9 08/05/2015 0952   CREATININE 1.14 (H) 05/24/2023 0832   CREATININE 0.72 03/08/2023 1236   CREATININE 1.1 08/05/2015 0952      Component Value Date/Time   CALCIUM 9.1 05/24/2023 0832   CALCIUM 9.4 08/05/2015 0952   ALKPHOS 122 05/24/2023 0832   ALKPHOS 104 08/05/2015 0952   AST 23 05/24/2023 0832   AST 16 08/05/2015 0952   ALT 21 05/24/2023 0832   ALT 17 08/05/2015 0952   BILITOT 0.3 05/24/2023 0832   BILITOT <0.30 08/05/2015 8657       RADIOGRAPHIC STUDIES: I have personally reviewed the radiological images as listed and agreed with the findings in the report. DG Hip Unilat W or Wo Pelvis 2-3 Views Left Result Date: 05/15/2023 CLINICAL DATA:  Left hip pain. EXAM: DG HIP (WITH OR WITHOUT PELVIS) 2-3V LEFT COMPARISON:  None Available. FINDINGS: There is no evidence of hip fracture or dislocation. There is no evidence of arthropathy or other focal bone abnormality. IMPRESSION: Negative. Electronically Signed   By: Kennith Center M.D.   On: 05/15/2023 07:03   MR Brain W Wo Contrast Result Date: 05/06/2023 CLINICAL DATA:  History of metastatic renal cell carcinoma. Staging. EXAM: MRI HEAD WITHOUT AND WITH CONTRAST TECHNIQUE: Multiplanar, multiecho pulse sequences of the brain and surrounding structures were obtained without and with intravenous contrast. CONTRAST:  8mL GADAVIST GADOBUTROL 1 MMOL/ML IV SOLN COMPARISON:  Brain MRI 02/04/2023. FINDINGS: Brain: Punctate focus of acute infarction in the left occipital lobe (axial image 19 series 6). No acute hemorrhage or significant mass effect. No hydrocephalus or extra-axial collection. No  mass or abnormal enhancement. No foci of abnormal susceptibility. Vascular: Normal flow voids and vessel enhancement. Skull and upper cervical spine: Normal marrow signal and enhancement. Sinuses/Orbits: No acute findings. Other: None. IMPRESSION:  1. Punctate focus of acute infarction in the left occipital lobe. 2. No evidence of intracranial metastatic disease. Electronically Signed   By: Orvan Falconer M.D.   On: 05/06/2023 08:36   IR IMAGING GUIDED PORT INSERTION Result Date: 04/30/2023 CLINICAL DATA:  History of right breast carcinoma post mastectomy and axillary node dissection. More recent right nephrectomy for clear cell carcinoma, needs durable venous access for planned treatment regimen EXAM: TUNNELED PORT CATHETER PLACEMENT WITH ULTRASOUND AND FLUOROSCOPIC GUIDANCE FLUOROSCOPY: Radiation Exposure Index (as provided by the fluoroscopic device): 6 mGy air Kerma ANESTHESIA/SEDATION: Intravenous Fentanyl and Versed 2mg  were administered by RN during a total moderate (conscious) sedation time of 28 minutes; the patient's level of consciousness and physiological / cardiorespiratory status were monitored continuously by radiology RN under my direct supervision. TECHNIQUE: The procedure, risks, benefits, and alternatives were explained to the patient. Questions regarding the procedure were encouraged and answered. The patient understands and consents to the procedure. A left approach was selected due to previous right mastectomy and axillary lymph node dissection, after discussion with the patient. Patency of the left IJ vein was confirmed with ultrasound with image documentation. An appropriate skin site was determined. Skin site was marked. Region was prepped using maximum barrier technique including cap and mask, sterile gown, sterile gloves, large sterile sheet, and Chlorhexidine as cutaneous antisepsis. The region was infiltrated locally with 1% lidocaine. Under real-time ultrasound guidance, the  left IJ vein was accessed with a 21 gauge micropuncture needle; the needle tip within the vein was confirmed with ultrasound image documentation. Needle was exchanged over a 018 guidewire for transitional dilator, and vascular measurement was performed. A small incision was made on the left anterior chest wall and a subcutaneous pocket fashioned. The power-injectable port was positioned and its catheter tunneled to the left IJ dermatotomy site. The transitional dilator was exchanged over an Amplatz wire for a peel-away sheath, through which the port catheter, which had been trimmed to the appropriate length, was advanced and positioned under fluoroscopy with its tip at the cavoatrial junction. Spot chest radiograph confirms good catheter position and no pneumothorax. The port was flushed per protocol. The pocket was closed with deep interrupted and subcuticular continuous 3-0 Monocryl sutures. The incisions were covered with Dermabond then covered with a sterile dressing. The patient tolerated the procedure well. COMPLICATIONS: COMPLICATIONS None immediate IMPRESSION: Technically successful left IJ power-injectable port catheter placement. Ready for routine use. Electronically Signed   By: Corlis Leak M.D.   On: 04/30/2023 15:07   ECHOCARDIOGRAM COMPLETE Result Date: 04/26/2023    ECHOCARDIOGRAM REPORT   Patient Name:   MARDA MIR Date of Exam: 04/26/2023 Medical Rec #:  [id number]       Height:       67.0 in Accession #:    [id number]      Weight:       168.9 lb Date of Birth:  May 03, 1941       BSA:          1.882 m Patient Age:    82 years        BP:           112/74 mmHg Patient Gender: F               HR:           101 bpm. Exam Location:  Outpatient Procedure: 2D Echo, 3D Echo, Color Doppler, Cardiac Doppler and Strain Analysis Indications:    Chemo  History:  Patient has no prior history of Echocardiogram examinations.  Sonographer:    Harriette Bouillon RDCS Referring Phys: [id number] [place] [facility] Azaleah Usman  IMPRESSIONS  1. Low normal EF with abnormal strain Suggest f/u MRI/TTE in 3 months.  2. Abnormal septal motion . Left ventricular ejection fraction, by estimation, is 50 to 55%. The left ventricle has low normal function. The left ventricle has no regional wall motion abnormalities. Left ventricular diastolic parameters are consistent with Grade I diastolic dysfunction (impaired relaxation). The average left ventricular global longitudinal strain is -11.3 %. The global longitudinal strain is abnormal.  3. Right ventricular systolic function is normal. The right ventricular size is normal.  4. The mitral valve is normal in structure. No evidence of mitral valve regurgitation. No evidence of mitral stenosis.  5. The aortic valve was not well visualized. Aortic valve regurgitation is not visualized. No aortic stenosis is present.  6. The inferior vena cava is normal in size with greater than 50% respiratory variability, suggesting right atrial pressure of 3 mmHg. FINDINGS  Left Ventricle: Abnormal septal motion. Left ventricular ejection fraction, by estimation, is 50 to 55%. The left ventricle has low normal function. The left ventricle has no regional wall motion abnormalities. The average left ventricular global longitudinal strain is -11.3 %. The global longitudinal strain is abnormal. The left ventricular internal cavity size was normal in size. There is no left ventricular hypertrophy. Left ventricular diastolic parameters are consistent with Grade I diastolic dysfunction (impaired relaxation). Right Ventricle: The right ventricular size is normal. No increase in right ventricular wall thickness. Right ventricular systolic function is normal. Left Atrium: Left atrial size was normal in size. Right Atrium: Right atrial size was normal in size. Pericardium: There is no evidence of pericardial effusion. Mitral Valve: The mitral valve is normal in structure. No evidence of mitral valve regurgitation. No evidence of  mitral valve stenosis. Tricuspid Valve: The tricuspid valve is normal in structure. Tricuspid valve regurgitation is not demonstrated. No evidence of tricuspid stenosis. Aortic Valve: The aortic valve was not well visualized. Aortic valve regurgitation is not visualized. No aortic stenosis is present. Pulmonic Valve: The pulmonic valve was normal in structure. Pulmonic valve regurgitation is not visualized. No evidence of pulmonic stenosis. Aorta: The aortic root is normal in size and structure. Venous: The inferior vena cava is normal in size with greater than 50% respiratory variability, suggesting right atrial pressure of 3 mmHg. IAS/Shunts: No atrial level shunt detected by color flow Doppler. Additional Comments: Low normal EF with abnormal strain Suggest f/u MRI/TTE in 3 months.  LEFT VENTRICLE PLAX 2D LVIDd:         3.80 cm      Diastology LVIDs:         3.20 cm      LV e' medial:    7.51 cm/s LV PW:         1.00 cm      LV E/e' medial:  9.5 LV IVS:        0.90 cm      LV e' lateral:   14.80 cm/s LVOT diam:     2.10 cm      LV E/e' lateral: 4.8 LV SV:         43 LV SV Index:   23           2D Longitudinal Strain LVOT Area:     3.46 cm     2D Strain GLS Avg:     -11.3 %  LV Volumes (MOD) LV vol d, MOD A2C: 99.5 ml LV vol d, MOD A4C: 101.0 ml LV vol s, MOD A2C: 55.2 ml LV vol s, MOD A4C: 57.2 ml LV SV MOD A2C:     44.3 ml LV SV MOD A4C:     101.0 ml LV SV MOD BP:      43.4 ml RIGHT VENTRICLE             IVC RV S prime:     14.00 cm/s  IVC diam: 1.20 cm TAPSE (M-mode): 1.7 cm LEFT ATRIUM             Index        RIGHT ATRIUM          Index LA diam:        3.10 cm 1.65 cm/m   RA Area:     9.85 cm LA Vol (A2C):   43.1 ml 22.90 ml/m  RA Volume:   16.00 ml 8.50 ml/m LA Vol (A4C):   31.4 ml 16.68 ml/m LA Biplane Vol: 39.9 ml 21.20 ml/m  AORTIC VALVE LVOT Vmax:   86.70 cm/s LVOT Vmean:  58.300 cm/s LVOT VTI:    0.125 m  AORTA Ao Root diam: 3.10 cm Ao Asc diam:  3.30 cm MITRAL VALVE MV Area (PHT): 5.70 cm      SHUNTS MV Decel Time: 133 msec     Systemic VTI:  0.12 m MV E velocity: 71.60 cm/s   Systemic Diam: 2.10 cm MV A velocity: 137.00 cm/s MV E/A ratio:  0.52 Charlton Haws MD Electronically signed by Charlton Haws MD Signature Date/Time: 04/26/2023/12:01:54 PM    Final

## 2023-05-22 NOTE — Assessment & Plan Note (Signed)
 continue lenvatinib started on 12/31 Continue with pembrolizumab lenvatinib at 10 mg daily for now

## 2023-05-23 ENCOUNTER — Other Ambulatory Visit: Payer: Self-pay

## 2023-05-23 DIAGNOSIS — C641 Malignant neoplasm of right kidney, except renal pelvis: Secondary | ICD-10-CM

## 2023-05-23 NOTE — Assessment & Plan Note (Addendum)
Off amlodipine without worsening HTN mostly 130 SBP

## 2023-05-23 NOTE — Assessment & Plan Note (Signed)
Drug monitoring for lenvatinib. Monitor for hypertension Baseline EKG normal QTc LFT, renal function, electrolytes, every 2 weeks for 2 months and then monthly thereafter Baseline TSH and free T4 and then monthly urine dipstick; if 2+ then obtain a 24-hour urine protein Dental exam before and periodically Monitor for signs of bleeding, wound healing, thrombosis

## 2023-05-23 NOTE — Assessment & Plan Note (Signed)
 Continue Marinol 5 mg twice daily.  Refill Prescription sent to her pharmacy.

## 2023-05-23 NOTE — Assessment & Plan Note (Addendum)
No fracture on XR Bone scan scheduled insurance declined CT first Norco as needed. Refilled

## 2023-05-23 NOTE — Progress Notes (Signed)
Specialty Pharmacy Refill Coordination Note  Diane Paul is a 83 y.o. female contacted today regarding refills of specialty medication(s) Lenvatinib Mesylate [facility])   Patient requested Delivery   Delivery date: 05/28/23   Verified address: [address]   [place] [address]   Medication will be filled on 05/27/23.   Pending refill request

## 2023-05-24 ENCOUNTER — Other Ambulatory Visit (HOSPITAL_COMMUNITY): Payer: Self-pay

## 2023-05-24 ENCOUNTER — Other Ambulatory Visit: Payer: Self-pay

## 2023-05-24 ENCOUNTER — Inpatient Hospital Stay (HOSPITAL_BASED_OUTPATIENT_CLINIC_OR_DEPARTMENT_OTHER): Payer: Medicare HMO

## 2023-05-24 ENCOUNTER — Inpatient Hospital Stay: Payer: Medicare HMO

## 2023-05-24 ENCOUNTER — Other Ambulatory Visit (HOSPITAL_BASED_OUTPATIENT_CLINIC_OR_DEPARTMENT_OTHER): Payer: Self-pay

## 2023-05-24 ENCOUNTER — Ambulatory Visit (HOSPITAL_COMMUNITY): Payer: Medicare HMO

## 2023-05-24 VITALS — BP 135/88 | Temp 97.8°F | Resp 16 | Wt 175.0 lb

## 2023-05-24 DIAGNOSIS — Z853 Personal history of malignant neoplasm of breast: Secondary | ICD-10-CM | POA: Diagnosis not present

## 2023-05-24 DIAGNOSIS — R630 Anorexia: Secondary | ICD-10-CM | POA: Diagnosis not present

## 2023-05-24 DIAGNOSIS — C641 Malignant neoplasm of right kidney, except renal pelvis: Secondary | ICD-10-CM

## 2023-05-24 DIAGNOSIS — C7801 Secondary malignant neoplasm of right lung: Secondary | ICD-10-CM

## 2023-05-24 DIAGNOSIS — Z9189 Other specified personal risk factors, not elsewhere classified: Secondary | ICD-10-CM

## 2023-05-24 DIAGNOSIS — C3412 Malignant neoplasm of upper lobe, left bronchus or lung: Secondary | ICD-10-CM | POA: Diagnosis not present

## 2023-05-24 DIAGNOSIS — I159 Secondary hypertension, unspecified: Secondary | ICD-10-CM | POA: Diagnosis not present

## 2023-05-24 DIAGNOSIS — M25552 Pain in left hip: Secondary | ICD-10-CM | POA: Diagnosis not present

## 2023-05-24 DIAGNOSIS — Z905 Acquired absence of kidney: Secondary | ICD-10-CM | POA: Diagnosis not present

## 2023-05-24 DIAGNOSIS — C7951 Secondary malignant neoplasm of bone: Secondary | ICD-10-CM | POA: Diagnosis not present

## 2023-05-24 DIAGNOSIS — Z79620 Long term (current) use of immunosuppressive biologic: Secondary | ICD-10-CM | POA: Diagnosis not present

## 2023-05-24 DIAGNOSIS — Z5112 Encounter for antineoplastic immunotherapy: Secondary | ICD-10-CM | POA: Diagnosis not present

## 2023-05-24 DIAGNOSIS — C7802 Secondary malignant neoplasm of left lung: Secondary | ICD-10-CM | POA: Diagnosis not present

## 2023-05-24 DIAGNOSIS — M8580 Other specified disorders of bone density and structure, unspecified site: Secondary | ICD-10-CM

## 2023-05-24 LAB — CBC WITH DIFFERENTIAL (CANCER CENTER ONLY)
Abs Immature Granulocytes: 0.03 10*3/uL (ref 0.00–0.07)
Basophils Absolute: 0.1 10*3/uL (ref 0.0–0.1)
Basophils Relative: 1 %
Eosinophils Absolute: 0.2 10*3/uL (ref 0.0–0.5)
Eosinophils Relative: 3 %
HCT: 35.6 % — ABNORMAL LOW (ref 36.0–46.0)
Hemoglobin: 10.6 g/dL — ABNORMAL LOW (ref 12.0–15.0)
Immature Granulocytes: 0 %
Lymphocytes Relative: 31 %
Lymphs Abs: 2.1 10*3/uL (ref 0.7–4.0)
MCH: 25.2 pg — ABNORMAL LOW (ref 26.0–34.0)
MCHC: 29.8 g/dL — ABNORMAL LOW (ref 30.0–36.0)
MCV: 84.8 fL (ref 80.0–100.0)
Monocytes Absolute: 1 10*3/uL (ref 0.1–1.0)
Monocytes Relative: 14 %
Neutro Abs: 3.5 10*3/uL (ref 1.7–7.7)
Neutrophils Relative %: 51 %
Platelet Count: 322 10*3/uL (ref 150–400)
RBC: 4.2 MIL/uL (ref 3.87–5.11)
RDW: 21.3 % — ABNORMAL HIGH (ref 11.5–15.5)
WBC Count: 6.8 10*3/uL (ref 4.0–10.5)
nRBC: 0 % (ref 0.0–0.2)

## 2023-05-24 LAB — CMP (CANCER CENTER ONLY)
ALT: 21 U/L (ref 0–44)
AST: 23 U/L (ref 15–41)
Albumin: 3.4 g/dL — ABNORMAL LOW (ref 3.5–5.0)
Alkaline Phosphatase: 122 U/L (ref 38–126)
Anion gap: 5 (ref 5–15)
BUN: 24 mg/dL — ABNORMAL HIGH (ref 8–23)
CO2: 30 mmol/L (ref 22–32)
Calcium: 9.1 mg/dL (ref 8.9–10.3)
Chloride: 100 mmol/L (ref 98–111)
Creatinine: 1.14 mg/dL — ABNORMAL HIGH (ref 0.44–1.00)
GFR, Estimated: 48 mL/min — ABNORMAL LOW (ref >60)
Glucose, Bld: 116 mg/dL — ABNORMAL HIGH (ref 70–99)
Potassium: 4.7 mmol/L (ref 3.5–5.1)
Sodium: 135 mmol/L (ref 135–145)
Total Bilirubin: 0.3 mg/dL (ref 0.0–1.2)
Total Protein: 7.5 g/dL (ref 6.5–8.1)

## 2023-05-24 LAB — LACTATE DEHYDROGENASE: LDH: 211 U/L — ABNORMAL HIGH (ref 98–192)

## 2023-05-24 MED ORDER — HEPARIN SOD (PORK) LOCK FLUSH 100 UNIT/ML IV SOLN
500.0000 [IU] | Freq: Once | INTRAVENOUS | Status: AC
  Administered 2023-05-24: 500 [IU]

## 2023-05-24 MED ORDER — SODIUM CHLORIDE 0.9% FLUSH
10.0000 mL | Freq: Once | INTRAVENOUS | Status: AC
  Administered 2023-05-24: 10 mL

## 2023-05-24 MED ORDER — DRONABINOL 5 MG PO CAPS
5.0000 mg | ORAL_CAPSULE | Freq: Two times a day (BID) | ORAL | 0 refills | Status: DC
  Filled 2023-05-24 – 2023-05-31 (×3): qty 30, 15d supply, fill #0

## 2023-05-24 MED ORDER — LENVATINIB (10 MG DAILY DOSE) 10 MG PO CPPK
10.0000 mg | ORAL_CAPSULE | Freq: Every day | ORAL | 0 refills | Status: DC
  Filled 2023-05-24: qty 30, 30d supply, fill #0

## 2023-05-24 MED ORDER — HYDROCODONE-ACETAMINOPHEN 5-325 MG PO TABS
1.0000 | ORAL_TABLET | Freq: Four times a day (QID) | ORAL | 0 refills | Status: DC | PRN
  Filled 2023-05-24: qty 120, 30d supply, fill #0

## 2023-05-27 ENCOUNTER — Other Ambulatory Visit: Payer: Self-pay

## 2023-05-30 ENCOUNTER — Other Ambulatory Visit: Payer: Self-pay

## 2023-05-31 ENCOUNTER — Encounter (HOSPITAL_COMMUNITY)
Admission: RE | Admit: 2023-05-31 | Discharge: 2023-05-31 | Disposition: A | Discharge status: Home / Self Care | Payer: Medicare HMO | Source: Ambulatory Visit

## 2023-05-31 ENCOUNTER — Other Ambulatory Visit (HOSPITAL_BASED_OUTPATIENT_CLINIC_OR_DEPARTMENT_OTHER): Payer: Self-pay

## 2023-05-31 DIAGNOSIS — C641 Malignant neoplasm of right kidney, except renal pelvis: Secondary | ICD-10-CM | POA: Diagnosis not present

## 2023-05-31 DIAGNOSIS — M25552 Pain in left hip: Secondary | ICD-10-CM | POA: Diagnosis not present

## 2023-05-31 DIAGNOSIS — M79659 Pain in unspecified thigh: Secondary | ICD-10-CM | POA: Diagnosis not present

## 2023-05-31 DIAGNOSIS — Z905 Acquired absence of kidney: Secondary | ICD-10-CM | POA: Diagnosis not present

## 2023-05-31 MED ORDER — TECHNETIUM TC 99M MEBROFENIN IV KIT
21.9000 | PACK | Freq: Once | INTRAVENOUS | Status: AC
  Administered 2023-05-31: 21.9 via INTRAVENOUS

## 2023-06-06 ENCOUNTER — Ambulatory Visit: Payer: Medicare HMO

## 2023-06-06 ENCOUNTER — Other Ambulatory Visit: Payer: Medicare HMO

## 2023-06-06 NOTE — Assessment & Plan Note (Signed)
Off amlodipine without worsening HTN mostly 130 SBP

## 2023-06-06 NOTE — Assessment & Plan Note (Signed)
Continue Marinol 5 mg twice daily. Refill Prescription sent to her pharmacy.

## 2023-06-06 NOTE — Assessment & Plan Note (Addendum)
Continue lenvatinib started on 12/31 Continue with pembrolizumab lenvatinib at 10 mg daily for now Vit D 1000 international units with Calcium 1200 mg daily Patient has no dentaI issues and teeth.  Start Zometa 3 mg every 3 months.

## 2023-06-06 NOTE — Assessment & Plan Note (Signed)
Drug monitoring for lenvatinib. Monitor for hypertension Baseline EKG normal QTc LFT, renal function, electrolytes, every 2 weeks for 2 months and then monthly thereafter Baseline TSH and free T4 and then monthly urine dipstick; if 2+ then obtain a 24-hour urine protein Dental exam before and periodically Monitor for signs of bleeding, wound healing, thrombosis

## 2023-06-06 NOTE — Assessment & Plan Note (Signed)
Currently not a candidate for anticoagulation or aspirin due to high risk of bleeding and hemorrhage and wound not completely healed Asymptomatic.  Will continue monitoring.

## 2023-06-06 NOTE — Progress Notes (Signed)
Patient Care Team: Donita Brooks, MD as PCP - General (Family Medicine)  Clinic Day:  06/07/2023  Referring physician: [name], MD  ASSESSMENT & PLAN:   Assessment & Plan: 83 y.o.w. 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   Diagnosis: stage IV clear cell RCC Treatment: Right nephrectomy. 04/26/23 started pembrolizumab. Delayed started of Lenvatinib at 10 mg daily due to wound on 05/07/23   She gets call for lenvatinib.  Will start Zometa.  Will continue to monitor labs, physical exam every cycle.  Metastatic renal cell carcinoma to bone (HCC) Continue lenvatinib started on 12/31 Continue with pembrolizumab lenvatinib at 10 mg daily for now Vit D 1000 international units with Calcium 1200 mg daily Patient has no dentaI issues and teeth.  Start Zometa 3 mg every 3 months.  Decreased appetite Continue Marinol 5 mg twice daily. Refill Prescription sent to her pharmacy.   Hypertension Off amlodipine without worsening HTN mostly 130 SBP  CVA (cerebral vascular accident) (HCC) Currently not a candidate for anticoagulation or aspirin due to high risk of bleeding and hemorrhage and wound not completely healed Asymptomatic.  Will continue monitoring.  At risk for side effect of medication Drug monitoring for lenvatinib. Monitor for hypertension Baseline EKG normal QTc LFT, renal function, electrolytes, every 2 weeks for 2 months and then monthly thereafter Baseline TSH and free T4 and then monthly urine dipstick; if 2+ then obtain a 24-hour urine protein Dental exam before and periodically Monitor for signs of bleeding, wound healing, thrombosis    The patient understands the plans discussed today and is in agreement with them.  She knows to contact our office if she develops concerns prior to her next appointment.  [name], MD  [facility] - A DEPT OF [facility] [address] [place] [address] Dept: [phone] Dept Fax: [phone]   No orders of the defined types were placed in this encounter.     CHIEF COMPLAINT:  CC: stage IV RCC  Current Treatment:  len/pembro  INTERVAL HISTORY:  Farah is here today for repeat clinical assessment. She denies fevers or chills. She denies mouth sores or pain. Her appetite is getting better.   BP at home <130 SBP.  Left hip pain improved and on Norco every 6 hours. Normal bowel movement. No diarrhea.  No difficulty urinating and drinking a lot fluid.   No palpitation, chest pain or stomach pain.  No edema, chest pain, short of breath, leg swelling, headache, focal weakness, bleeding.  No redness, wound.  Dental issue: none. No teeth.   I have reviewed the past medical history, past surgical history, social history and family history with the patient and they are unchanged from previous note.  ALLERGIES:  is allergic to fosamax [alendronate sodium].  MEDICATIONS:  Current Outpatient Medications  Medication Sig Dispense Refill   amLODipine (NORVASC) 5 MG tablet Take 5 mg by mouth daily. (Patient not taking: Reported on 04/08/2023)     dronabinol (MARINOL) 5 MG capsule Take 1 capsule (5 mg total) by mouth 2 (two) times daily before a meal. 30 capsule 0   Ensure (ENSURE) Take 237 mLs by mouth 2 (two) times daily between meals.     HYDROcodone-acetaminophen (NORCO) 5-325 MG tablet Take 1 tablet by mouth every 6 (six) hours as  needed for moderate pain (pain score 4-6) or severe pain (pain score 7-10). 120 tablet 0   lenvatinib 10 mg daily dose (LENVIMA) capsule Take 1 capsule (10 mg total) by mouth daily. 30 capsule 0   lidocaine-prilocaine (EMLA) cream Apply 1 Application topically as needed. Apply to port ~ 1 hour prior to access 30 g 0   ondansetron (ZOFRAN) 8 MG tablet Take 1 tablet (8 mg total) by mouth  every 8 (eight) hours as needed for nausea or vomiting. 30 tablet 1   prochlorperazine (COMPAZINE) 10 MG tablet Take 1 tablet (10 mg total) by mouth every 6 (six) hours as needed for nausea or vomiting. 30 tablet 1   senna-docusate (SENOKOT-S) 8.6-50 MG tablet Take 1 tablet by mouth 2 (two) times daily. While taking strong pain meds to prevent constipation 10 tablet 0   No current facility-administered medications for this visit.   Facility-Administered Medications Ordered in Other Visits  Medication Dose Route Frequency Provider Last Rate Last Admin   0.9 %  sodium chloride infusion   Intravenous Continuous [name], MD 10 mL/hr at 06/07/23 0911 New Bag at 06/07/23 0911   pembrolizumab (KEYTRUDA) 200 mg in sodium chloride 0.9 % 50 mL chemo infusion  200 mg Intravenous Once [name], MD        HISTORY OF PRESENT ILLNESS:   Oncology History  History of breast cancer  06/11/2011 Surgery   Right breast mastectomy with right axillary sentinel lymph node biopsy 1.3 cm invasive lobular cancer, grade 1, atypical lobular hyperplasia, focal LV I, ER 91%, PR 25%, HER-2 negative, 0/8 lymph nodes T1 cN0 stage IA   07/25/2011 -  Anti-estrogen oral therapy   Letrozole 2.5 mg daily 7 years was a plan   Metastatic renal cell carcinoma to bone (HCC)  02/01/2023 Initial Diagnosis   Renal cell carcinoma (HCC)   02/05/2023 Imaging   Bone scan: No evidence of osseous metastatic disease.    04/03/2023 Pathology Results   A. KIDNEY, RIGHT, RADICAL NEPHRECTOMY:  Clear cell renal cell carcinoma with rhabdoid, sarcomatoid Features and  tumor necrosis, nuclear grade 4, size 12.0 cm  Tumor extends into perinephric, renal sinus fat and segmental branches  of renal vein (pT3a)  Ureteral, vascular and all margins of resection are negative for tumor    Benign adrenal gland    04/23/2023 - 04/23/2023 Chemotherapy   Patient is on Treatment Plan : RENAL CELL Pembrolizumab (200) + Axitinib q21d      04/25/2023 -  Chemotherapy   Patient is on Treatment Plan : Clear Cell RCC Pembrolizumab (200) q21d     NSCLC of left lung (HCC)  03/18/2023 Initial Diagnosis   NSCLC of left lung (HCC)   06/07/2023 Cancer Staging   Staging form: Lung, AJCC 8th Edition - Clinical: cT1, cN1, cM0 - Signed by [name], MD on 06/07/2023 Stage prefix: Initial diagnosis Histologic grade (G): GX Histologic grading system: 4 grade system       REVIEW OF SYSTEMS:   All relevant systems were reviewed with the patient and are negative.   VITALS:  Blood pressure 137/84, pulse 84, temperature (!) 97.3 F (36.3 C), temperature source Temporal, resp. rate 15, weight 177 lb 8 oz (80.5 kg), SpO2 100%.  Wt Readings from Last 3 Encounters:  06/07/23 177 lb 8 oz (80.5 kg)  05/24/23 175 lb (79.4 kg)  05/16/23 173 lb 1.6 oz (78.5 kg)    Body mass index is 27.8 kg/m.  Performance status (  ECOG): 2 - Symptomatic, <50% confined to bed  PHYSICAL EXAM:   GENERAL:alert, no distress and comfortable SKIN: skin color normal, no rashes  EYES: normal, sclera clear OROPHARYNX: no exudate, no erythema    NECK: supple,  non-tender, without nodularity LYMPH:  no palpable cervical lymphadenopathy LUNGS: clear to auscultation with normal breathing effort.  No wheeze or rales HEART: regular rate & rhythm and no murmurs and no lower extremity edema ABDOMEN: abdomen soft, non-tender and nondistended Wound is healing well. Musculoskeletal: no edema NEURO: alert, fluent speech, no focal motor/sensory deficits.  Strength and sensation equal bilaterally.  LABORATORY DATA:  I have reviewed the data as listed    Component Value Date/Time   NA 138 06/07/2023 0753   NA 141 08/05/2015 0952   K 4.3 06/07/2023 0753   K 4.1 08/05/2015 0952   CL 103 06/07/2023 0753   CL 108 (H) 09/19/2012 1156   CO2 29 06/07/2023 0753   CO2 24 08/05/2015 0952   GLUCOSE 99 06/07/2023 0753   GLUCOSE 109 08/05/2015 0952   GLUCOSE 101 (H)  09/19/2012 1156   BUN 18 06/07/2023 0753   BUN 20.9 08/05/2015 0952   CREATININE 1.35 (H) 06/07/2023 0753   CREATININE 0.72 03/08/2023 1236   CREATININE 1.1 08/05/2015 0952   CALCIUM 8.9 06/07/2023 0753   CALCIUM 9.4 08/05/2015 0952   PROT 7.0 06/07/2023 0753   PROT 7.8 08/05/2015 0952   ALBUMIN 3.6 06/07/2023 0753   ALBUMIN 3.7 08/05/2015 0952   AST 16 06/07/2023 0753   AST 16 08/05/2015 0952   ALT 15 06/07/2023 0753   ALT 17 08/05/2015 0952   ALKPHOS 109 06/07/2023 0753   ALKPHOS 104 08/05/2015 0952   BILITOT 0.3 06/07/2023 0753   BILITOT <0.30 08/05/2015 0952   GFRNONAA 39 (L) 06/07/2023 0753   GFRNONAA 39 (L) 08/12/2020 1502   GFRAA 45 (L) 08/12/2020 1502    No results found for: "SPEP", "UPEP"  Lab Results  Component Value Date   WBC 5.2 06/07/2023   NEUTROABS 2.4 06/07/2023   HGB 11.5 (L) 06/07/2023   HCT 37.7 06/07/2023   MCV 85.7 06/07/2023   PLT 203 06/07/2023      Chemistry      Component Value Date/Time   NA 138 06/07/2023 0753   NA 141 08/05/2015 0952   K 4.3 06/07/2023 0753   K 4.1 08/05/2015 0952   CL 103 06/07/2023 0753   CL 108 (H) 09/19/2012 1156   CO2 29 06/07/2023 0753   CO2 24 08/05/2015 0952   BUN 18 06/07/2023 0753   BUN 20.9 08/05/2015 0952   CREATININE 1.35 (H) 06/07/2023 0753   CREATININE 0.72 03/08/2023 1236   CREATININE 1.1 08/05/2015 0952      Component Value Date/Time   CALCIUM 8.9 06/07/2023 0753   CALCIUM 9.4 08/05/2015 0952   ALKPHOS 109 06/07/2023 0753   ALKPHOS 104 08/05/2015 0952   AST 16 06/07/2023 0753   AST 16 08/05/2015 0952   ALT 15 06/07/2023 0753   ALT 17 08/05/2015 0952   BILITOT 0.3 06/07/2023 0753   BILITOT <0.30 08/05/2015 0272       RADIOGRAPHIC STUDIES: I have personally reviewed the radiological images as listed and agreed with the findings in the report. DG Hip Unilat W or Wo Pelvis 2-3 Views Left Result Date: 05/15/2023 CLINICAL DATA:  Left hip pain. EXAM: DG HIP (WITH OR WITHOUT PELVIS) 2-3V  LEFT COMPARISON:  None Available. FINDINGS: There is no evidence of hip fracture or  dislocation. There is no evidence of arthropathy or other focal bone abnormality. IMPRESSION: Negative. Electronically Signed   By: Kennith Center M.D.   On: 05/15/2023 07:03

## 2023-06-07 ENCOUNTER — Inpatient Hospital Stay: Payer: Medicare HMO

## 2023-06-07 ENCOUNTER — Inpatient Hospital Stay (HOSPITAL_BASED_OUTPATIENT_CLINIC_OR_DEPARTMENT_OTHER): Payer: Medicare HMO

## 2023-06-07 VITALS — BP 137/84 | HR 84 | Temp 97.3°F | Resp 15 | Wt 177.5 lb

## 2023-06-07 DIAGNOSIS — Z9189 Other specified personal risk factors, not elsewhere classified: Secondary | ICD-10-CM | POA: Diagnosis not present

## 2023-06-07 DIAGNOSIS — M25552 Pain in left hip: Secondary | ICD-10-CM | POA: Diagnosis not present

## 2023-06-07 DIAGNOSIS — I159 Secondary hypertension, unspecified: Secondary | ICD-10-CM | POA: Diagnosis not present

## 2023-06-07 DIAGNOSIS — C641 Malignant neoplasm of right kidney, except renal pelvis: Secondary | ICD-10-CM

## 2023-06-07 DIAGNOSIS — M8580 Other specified disorders of bone density and structure, unspecified site: Secondary | ICD-10-CM

## 2023-06-07 DIAGNOSIS — C649 Malignant neoplasm of unspecified kidney, except renal pelvis: Secondary | ICD-10-CM

## 2023-06-07 DIAGNOSIS — Z905 Acquired absence of kidney: Secondary | ICD-10-CM | POA: Diagnosis not present

## 2023-06-07 DIAGNOSIS — I639 Cerebral infarction, unspecified: Secondary | ICD-10-CM | POA: Diagnosis not present

## 2023-06-07 DIAGNOSIS — R630 Anorexia: Secondary | ICD-10-CM

## 2023-06-07 DIAGNOSIS — Z79620 Long term (current) use of immunosuppressive biologic: Secondary | ICD-10-CM | POA: Diagnosis not present

## 2023-06-07 DIAGNOSIS — C3412 Malignant neoplasm of upper lobe, left bronchus or lung: Secondary | ICD-10-CM | POA: Diagnosis not present

## 2023-06-07 DIAGNOSIS — Z5112 Encounter for antineoplastic immunotherapy: Secondary | ICD-10-CM | POA: Diagnosis not present

## 2023-06-07 DIAGNOSIS — Z853 Personal history of malignant neoplasm of breast: Secondary | ICD-10-CM | POA: Diagnosis not present

## 2023-06-07 DIAGNOSIS — C7802 Secondary malignant neoplasm of left lung: Secondary | ICD-10-CM | POA: Diagnosis not present

## 2023-06-07 DIAGNOSIS — C7951 Secondary malignant neoplasm of bone: Secondary | ICD-10-CM | POA: Diagnosis not present

## 2023-06-07 DIAGNOSIS — C7801 Secondary malignant neoplasm of right lung: Secondary | ICD-10-CM | POA: Diagnosis not present

## 2023-06-07 LAB — CBC WITH DIFFERENTIAL (CANCER CENTER ONLY)
Abs Immature Granulocytes: 0.01 10*3/uL (ref 0.00–0.07)
Basophils Absolute: 0 10*3/uL (ref 0.0–0.1)
Basophils Relative: 1 %
Eosinophils Absolute: 0.2 10*3/uL (ref 0.0–0.5)
Eosinophils Relative: 4 %
HCT: 37.7 % (ref 36.0–46.0)
Hemoglobin: 11.5 g/dL — ABNORMAL LOW (ref 12.0–15.0)
Immature Granulocytes: 0 %
Lymphocytes Relative: 36 %
Lymphs Abs: 1.9 10*3/uL (ref 0.7–4.0)
MCH: 26.1 pg (ref 26.0–34.0)
MCHC: 30.5 g/dL (ref 30.0–36.0)
MCV: 85.7 fL (ref 80.0–100.0)
Monocytes Absolute: 0.7 10*3/uL (ref 0.1–1.0)
Monocytes Relative: 14 %
Neutro Abs: 2.4 10*3/uL (ref 1.7–7.7)
Neutrophils Relative %: 45 %
Platelet Count: 203 10*3/uL (ref 150–400)
RBC: 4.4 MIL/uL (ref 3.87–5.11)
RDW: 20 % — ABNORMAL HIGH (ref 11.5–15.5)
WBC Count: 5.2 10*3/uL (ref 4.0–10.5)
nRBC: 0 % (ref 0.0–0.2)

## 2023-06-07 LAB — CMP (CANCER CENTER ONLY)
ALT: 15 U/L (ref 0–44)
AST: 16 U/L (ref 15–41)
Albumin: 3.6 g/dL (ref 3.5–5.0)
Alkaline Phosphatase: 109 U/L (ref 38–126)
Anion gap: 6 (ref 5–15)
BUN: 18 mg/dL (ref 8–23)
CO2: 29 mmol/L (ref 22–32)
Calcium: 8.9 mg/dL (ref 8.9–10.3)
Chloride: 103 mmol/L (ref 98–111)
Creatinine: 1.35 mg/dL — ABNORMAL HIGH (ref 0.44–1.00)
GFR, Estimated: 39 mL/min — ABNORMAL LOW (ref >60)
Glucose, Bld: 99 mg/dL (ref 70–99)
Potassium: 4.3 mmol/L (ref 3.5–5.1)
Sodium: 138 mmol/L (ref 135–145)
Total Bilirubin: 0.3 mg/dL (ref 0.0–1.2)
Total Protein: 7 g/dL (ref 6.5–8.1)

## 2023-06-07 LAB — LACTATE DEHYDROGENASE: LDH: 199 U/L — ABNORMAL HIGH (ref 98–192)

## 2023-06-07 LAB — TOTAL PROTEIN, URINE DIPSTICK: Protein, ur: NEGATIVE mg/dL

## 2023-06-07 LAB — TSH: TSH: 4.819 u[IU]/mL — ABNORMAL HIGH (ref 0.350–4.500)

## 2023-06-07 MED ORDER — SODIUM CHLORIDE 0.9 % IV SOLN: INTRAVENOUS | Status: DC

## 2023-06-07 MED ORDER — SODIUM CHLORIDE 0.9% FLUSH
10.0000 mL | Freq: Once | INTRAVENOUS | Status: AC
  Administered 2023-06-07: 10 mL

## 2023-06-07 MED ORDER — HEPARIN SOD (PORK) LOCK FLUSH 100 UNIT/ML IV SOLN
500.0000 [IU] | Freq: Once | INTRAVENOUS | Status: AC | PRN
  Administered 2023-06-07: 500 [IU]

## 2023-06-07 MED ORDER — SODIUM CHLORIDE 0.9% FLUSH
10.0000 mL | INTRAVENOUS | Status: DC | PRN
  Administered 2023-06-07: 10 mL

## 2023-06-07 MED ORDER — PEMBROLIZUMAB CHEMO INJECTION 100 MG/4ML
200.0000 mg | Freq: Once | INTRAVENOUS | Status: AC
  Administered 2023-06-07: 200 mg via INTRAVENOUS
  Filled 2023-06-07: qty 200

## 2023-06-07 NOTE — Patient Instructions (Signed)
 [facility] - A DEPT OF [facility]   Discharge Instructions: Thank you for choosing [facility] to provide your oncology and hematology care.   If you have a lab appointment with the Cancer Center, please go directly to the Cancer Center and check in at the registration area.   Wear comfortable clothing and clothing appropriate for easy access to any Portacath or PICC line.   We strive to give you quality time with your provider. You may need to reschedule your appointment if you arrive late (15 or more minutes).  Arriving late affects you and other patients whose appointments are after yours.  Also, if you miss three or more appointments without notifying the office, you may be dismissed from the clinic at the provider's discretion.      For prescription refill requests, have your pharmacy contact our office and allow 72 hours for refills to be completed.    Today you received the following chemotherapy and/or immunotherapy agents: Pembrolizumab Rande Lawman)      To help prevent nausea and vomiting after your treatment, we encourage you to take your nausea medication as directed.  BELOW ARE SYMPTOMS THAT SHOULD BE REPORTED IMMEDIATELY: *FEVER GREATER THAN 100.4 F (38 C) OR HIGHER *CHILLS OR SWEATING *NAUSEA AND VOMITING THAT IS NOT CONTROLLED WITH YOUR NAUSEA MEDICATION *UNUSUAL SHORTNESS OF BREATH *UNUSUAL BRUISING OR BLEEDING *URINARY PROBLEMS (pain or burning when urinating, or frequent urination) *BOWEL PROBLEMS (unusual diarrhea, constipation, pain near the anus) TENDERNESS IN MOUTH AND THROAT WITH OR WITHOUT PRESENCE OF ULCERS (sore throat, sores in mouth, or a toothache) UNUSUAL RASH, SWELLING OR PAIN  UNUSUAL VAGINAL DISCHARGE OR ITCHING   Items with * indicate a potential emergency and should be followed up as soon as possible or go to the Emergency Department if any problems should occur.  Please show the CHEMOTHERAPY ALERT CARD  or IMMUNOTHERAPY ALERT CARD at check-in to the Emergency Department and triage nurse.  Should you have questions after your visit or need to cancel or reschedule your appointment, please contact [facility] - A DE[facility] OF [facility]  Dept: [phone]  and follow the prompts.  Office hours are 8:00 a.m. to 4:30 p.m. Monday - Friday. Please note that voicemails left after 4:00 p.m. may not be returned until the following business day.  We are closed weekends and major holidays. You have access to a nurse at all times for urgent questions. Please call the main number to the clinic Dept: [phone] and follow the prompts.   For any non-urgent questions, you may also contact your provider using MyChart. We now offer e-Visits for anyone 52 and older to request care online for non-urgent symptoms. For details visit [email or web address].   Also download the MyChart app! Go to the app store, search "MyChart", open the app, select [facility], and log in with your MyChart username and password.

## 2023-06-12 ENCOUNTER — Other Ambulatory Visit: Payer: Self-pay

## 2023-06-13 ENCOUNTER — Other Ambulatory Visit: Payer: Self-pay

## 2023-06-13 ENCOUNTER — Other Ambulatory Visit (HOSPITAL_BASED_OUTPATIENT_CLINIC_OR_DEPARTMENT_OTHER): Payer: Self-pay

## 2023-06-13 ENCOUNTER — Other Ambulatory Visit (HOSPITAL_COMMUNITY): Payer: Self-pay

## 2023-06-13 NOTE — Progress Notes (Signed)
 Specialty Pharmacy Ongoing Clinical Assessment Note  Diane Paul is a 83 y.o. female who is being followed by the specialty pharmacy service for RxSp Oncology   Patient's specialty medication(s) reviewed today: Lenvatinib  Mesylate (LENVIMA )   Missed doses in the last 4 weeks: 0   Patient/Caregiver did not have any additional questions or concerns.   Therapeutic benefit summary: Unable to assess   Adverse events/side effects summary: No adverse events/side effects   Patient's therapy is appropriate to: Continue    Goals Addressed             This Visit's Progress    Maintain optimal adherence to therapy   On track    Patient is initiating therapy. Patient will maintain adherence         Follow up:  6 months  [name] M Calirose Mccance Specialty Pharmacist

## 2023-06-16 ENCOUNTER — Other Ambulatory Visit: Payer: Self-pay

## 2023-06-20 ENCOUNTER — Other Ambulatory Visit: Payer: Self-pay

## 2023-06-20 ENCOUNTER — Telehealth: Payer: Self-pay | Admitting: *Deleted

## 2023-06-20 ENCOUNTER — Other Ambulatory Visit (HOSPITAL_BASED_OUTPATIENT_CLINIC_OR_DEPARTMENT_OTHER): Payer: Self-pay

## 2023-06-20 MED ORDER — DRONABINOL 5 MG PO CAPS
5.0000 mg | ORAL_CAPSULE | Freq: Two times a day (BID) | ORAL | 0 refills | Status: DC
  Filled 2023-06-20: qty 30, 15d supply, fill #0

## 2023-06-20 NOTE — Telephone Encounter (Signed)
Pt is requesting a refill of Marinol to be sent to [facility].

## 2023-06-25 ENCOUNTER — Other Ambulatory Visit: Payer: Self-pay

## 2023-06-25 ENCOUNTER — Other Ambulatory Visit (HOSPITAL_COMMUNITY): Payer: Self-pay

## 2023-06-25 DIAGNOSIS — C641 Malignant neoplasm of right kidney, except renal pelvis: Secondary | ICD-10-CM

## 2023-06-25 MED ORDER — LENVATINIB (10 MG DAILY DOSE) 10 MG PO CPPK
10.0000 mg | ORAL_CAPSULE | Freq: Every day | ORAL | 0 refills | Status: DC
Start: 2023-06-25 — End: 2023-07-24
  Filled 2023-06-25: qty 30, 30d supply, fill #0

## 2023-06-25 NOTE — Progress Notes (Signed)
Specialty Pharmacy Refill Coordination Note  Diane Paul is a 83 y.o. female contacted today regarding refills of specialty medication(s) Lenvatinib Mesylate [facility])   Patient requested Delivery   Delivery date: 07/01/23   Verified address: [address]   [place] [address]   Medication will be filled on 06/28/23.

## 2023-06-27 ENCOUNTER — Other Ambulatory Visit: Payer: Medicare HMO

## 2023-06-27 ENCOUNTER — Ambulatory Visit: Payer: Medicare HMO

## 2023-06-27 DIAGNOSIS — N183 Chronic kidney disease, stage 3 unspecified: Secondary | ICD-10-CM | POA: Insufficient documentation

## 2023-06-27 NOTE — Assessment & Plan Note (Signed)
 Currently not a candidate for anticoagulation or aspirin due to high risk of bleeding and hemorrhage and wound not completely healed Asymptomatic.  Will continue monitoring.

## 2023-06-27 NOTE — Assessment & Plan Note (Signed)
 Continue Marinol 5 mg twice daily. Refill Prescription sent to her pharmacy.

## 2023-06-27 NOTE — Assessment & Plan Note (Signed)
Drug monitoring for lenvatinib. Monitor for hypertension Baseline EKG normal QTc LFT, renal function, electrolytes, every 2 weeks for 2 months and then monthly thereafter Baseline TSH and free T4 and then monthly urine dipstick; if 2+ then obtain a 24-hour urine protein Dental exam before and periodically Monitor for signs of bleeding, wound healing, thrombosis Monitor for irAEs

## 2023-06-27 NOTE — Progress Notes (Unsigned)
Patient Care Team: Donita Brooks, MD as PCP - General (Family Medicine)  Clinic Day:  06/28/2023  Referring physician: [name], MD  ASSESSMENT & PLAN:   Assessment & Plan: 83 y.o.w. 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   Diagnosis: stage IV clear cell RCC Treatment: Right nephrectomy. 04/26/23 started pembrolizumab. Delayed started of Lenvatinib at 10 mg daily due to wound on 05/07/23   Complete 3 cycles of pembro now.  She gets call for lenvatinib. Overall improving and more functioning. Excellent improvement. Will repeat CT in late March.  Refilled marinol today.   Will start Zometa.  Will continue to monitor labs, physical exam every cycle.  Metastatic renal cell carcinoma to bone (HCC) Continue lenvatinib started on 12/31 Continue with pembrolizumab lenvatinib at 10 mg daily for now Vit D 1000 international units with Calcium 1200 mg daily Patient has no dentaI issues and teeth.  Start Zometa 3 mg every 3 months.  CKD (chronic kidney disease) stage 3, GFR 30-59 ml/min (HCC) Stable for the past few weeks Monitor with renal function every cycle  At risk for side effect of medication Drug monitoring for lenvatinib. Monitor for hypertension Baseline EKG normal QTc LFT, renal function, electrolytes, every 2 weeks for 2 months and then monthly thereafter Baseline TSH and free T4 and then monthly urine dipstick; if 2+ then obtain a 24-hour urine protein Dental exam before and periodically Monitor for signs of bleeding, wound healing, thrombosis Monitor for irAEs  Decreased appetite Continue Marinol 5 mg twice daily. Refilled Prescription today sent to [facility].   CVA (cerebral vascular accident) (HCC) Currently not a candidate for anticoagulation or aspirin due to high risk of bleeding and hemorrhage and wound not completely healed Asymptomatic.   Will continue monitoring.  Hypertension Will stop amlodipine for now.  Continue to monitor blood pressure at home.    The patient understands the plans discussed today and is in agreement with them.  She knows to contact our office if she develops concerns prior to her next appointment.  [name], MD  [facility] - A DEPT OF [facility] [address] [place] [address] Dept: [phone] Dept Fax: [phone]   No orders of the defined types were placed in this encounter.     CHIEF COMPLAINT:  CC: mRCC  Current Treatment:  pembro/lenv  INTERVAL HISTORY:  Diane Paul is here today for repeat clinical assessment. She denies fevers or chills. She denies pain. Her appetite has improved.  BP at home about 120 w/out medication.  No difficulty urinating and drinking plenty of fluid.  No palpitation. No chest pain, short of breath, diarrhea, and wound.  Nausea, once or twice and not persistent.  Slight ankle swelling resolved in the morning.   I have reviewed the past medical history, past surgical history, social history and family history with the patient and they are unchanged from previous note.  ALLERGIES:  is allergic to fosamax [alendronate sodium].  MEDICATIONS:  Current Outpatient Medications  Medication Sig Dispense Refill   dronabinol (MARINOL) 5 MG capsule Take 1 capsule (5 mg total) by mouth 2 (two) times daily before a meal. 60 capsule 0   Ensure (ENSURE) Take 237 mLs by mouth 2 (two) times daily between meals.     lenvatinib 10 mg daily dose (LENVIMA)  capsule Take 1 capsule (10 mg total) by mouth daily. 30 capsule 0   lidocaine-prilocaine (EMLA) cream Apply 1 Application topically as needed. Apply to port ~ 1 hour prior to access 30 g 0   ondansetron (ZOFRAN) 8 MG tablet Take 1 tablet (8 mg total) by mouth every 8 (eight) hours as needed for nausea or vomiting. 30 tablet 1   prochlorperazine  (COMPAZINE) 10 MG tablet Take 1 tablet (10 mg total) by mouth every 6 (six) hours as needed for nausea or vomiting. 30 tablet 1   senna-docusate (SENOKOT-S) 8.6-50 MG tablet Take 1 tablet by mouth 2 (two) times daily. While taking strong pain meds to prevent constipation 10 tablet 0   No current facility-administered medications for this visit.   Facility-Administered Medications Ordered in Other Visits  Medication Dose Route Frequency Provider Last Rate Last Admin   0.9 %  sodium chloride infusion   Intravenous Continuous [name], MD       pembrolizumab [facility]) 200 mg in sodium chloride 0.9 % 50 mL chemo infusion  200 mg Intravenous Once [name], MD       zoledronic acid (ZOMETA) 3 mg in sodium chloride 0.9 % 100 mL IVPB  3 mg Intravenous Once [name], MD        HISTORY OF PRESENT ILLNESS:   Oncology History  History of breast cancer  06/11/2011 Surgery   Right breast mastectomy with right axillary sentinel lymph node biopsy 1.3 cm invasive lobular cancer, grade 1, atypical lobular hyperplasia, focal LV I, ER 91%, PR 25%, HER-2 negative, 0/8 lymph nodes T1 cN0 stage IA   07/25/2011 -  Anti-estrogen oral therapy   Letrozole 2.5 mg daily 7 years was a plan   Metastatic renal cell carcinoma to bone (HCC)  02/01/2023 Initial Diagnosis   Renal cell carcinoma (HCC)   02/05/2023 Imaging   Bone scan: No evidence of osseous metastatic disease.    04/03/2023 Pathology Results   A. KIDNEY, RIGHT, RADICAL NEPHRECTOMY:  Clear cell renal cell carcinoma with rhabdoid, sarcomatoid Features and  tumor necrosis, nuclear grade 4, size 12.0 cm  Tumor extends into perinephric, renal sinus fat and segmental branches  of renal vein (pT3a)  Ureteral, vascular and all margins of resection are negative for tumor    Benign adrenal gland    04/23/2023 - 04/23/2023 Chemotherapy   Patient is on Treatment Plan : RENAL CELL Pembrolizumab (200) + Axitinib q21d     04/25/2023 -   Chemotherapy   Patient is on Treatment Plan : Clear Cell RCC Pembrolizumab (200) q21d     NSCLC of left lung (HCC)  03/18/2023 Initial Diagnosis   NSCLC of left lung (HCC)   06/07/2023 Cancer Staging   Staging form: Lung, AJCC 8th Edition - Clinical: cT1, cN1, cM0 - Signed by [name], MD on 06/07/2023 Stage prefix: Initial diagnosis Histologic grade (G): GX Histologic grading system: 4 grade system       REVIEW OF SYSTEMS:   All relevant systems were reviewed with the patient and are negative.   VITALS:  Blood pressure 136/84, pulse 82, temperature 97.8 F (36.6 C), temperature source Temporal, resp. rate 14, weight 174 lb (78.9 kg), SpO2 100%.  Wt Readings from Last 3 Encounters:  06/28/23 174 lb (78.9 kg)  06/07/23 177 lb 8 oz (80.5 kg)  05/24/23 175 lb (79.4 kg)    Body mass index is 27.25 kg/m.  Performance status (ECOG): 2 - Symptomatic, <50% confined to  bed  PHYSICAL EXAM:   GENERAL:alert, no distress and comfortable SKIN: skin color normal, no rashes  EYES: normal, sclera clear LUNGS: clear to auscultation with normal breathing effort.  No wheeze or rales HEART: regular rate & rhythm and no murmurs ABDOMEN: abdomen soft, non-tender and nondistended Musculoskeletal: Bilateral ankle nonpitting edema  LABORATORY DATA:  I have reviewed the data as listed    Component Value Date/Time   NA 137 06/28/2023 0743   NA 141 08/05/2015 0952   K 3.9 06/28/2023 0743   K 4.1 08/05/2015 0952   CL 103 06/28/2023 0743   CL 108 (H) 09/19/2012 1156   CO2 28 06/28/2023 0743   CO2 24 08/05/2015 0952   GLUCOSE 113 (H) 06/28/2023 0743   GLUCOSE 109 08/05/2015 0952   GLUCOSE 101 (H) 09/19/2012 1156   BUN 17 06/28/2023 0743   BUN 20.9 08/05/2015 0952   CREATININE 1.35 (H) 06/28/2023 0743   CREATININE 0.72 03/08/2023 1236   CREATININE 1.1 08/05/2015 0952   CALCIUM 9.1 06/28/2023 0743   CALCIUM 9.4 08/05/2015 0952   PROT 6.6 06/28/2023 0743   PROT 7.8 08/05/2015  0952   ALBUMIN 3.8 06/28/2023 0743   ALBUMIN 3.7 08/05/2015 0952   AST 14 (L) 06/28/2023 0743   AST 16 08/05/2015 0952   ALT 12 06/28/2023 0743   ALT 17 08/05/2015 0952   ALKPHOS 83 06/28/2023 0743   ALKPHOS 104 08/05/2015 0952   BILITOT 0.3 06/28/2023 0743   BILITOT <0.30 08/05/2015 0952   GFRNONAA 39 (L) 06/28/2023 0743   GFRNONAA 39 (L) 08/12/2020 1502   GFRAA 45 (L) 08/12/2020 1502    No results found for: "SPEP", "UPEP"  Lab Results  Component Value Date   WBC 4.9 06/28/2023   NEUTROABS 2.1 06/28/2023   HGB 12.8 06/28/2023   HCT 40.8 06/28/2023   MCV 84.8 06/28/2023   PLT 174 06/28/2023      Chemistry      Component Value Date/Time   NA 137 06/28/2023 0743   NA 141 08/05/2015 0952   K 3.9 06/28/2023 0743   K 4.1 08/05/2015 0952   CL 103 06/28/2023 0743   CL 108 (H) 09/19/2012 1156   CO2 28 06/28/2023 0743   CO2 24 08/05/2015 0952   BUN 17 06/28/2023 0743   BUN 20.9 08/05/2015 0952   CREATININE 1.35 (H) 06/28/2023 0743   CREATININE 0.72 03/08/2023 1236   CREATININE 1.1 08/05/2015 0952      Component Value Date/Time   CALCIUM 9.1 06/28/2023 0743   CALCIUM 9.4 08/05/2015 0952   ALKPHOS 83 06/28/2023 0743   ALKPHOS 104 08/05/2015 0952   AST 14 (L) 06/28/2023 0743   AST 16 08/05/2015 0952   ALT 12 06/28/2023 0743   ALT 17 08/05/2015 0952   BILITOT 0.3 06/28/2023 0743   BILITOT <0.30 08/05/2015 1610       RADIOGRAPHIC STUDIES: I have personally reviewed the radiological images as listed and agreed with the findings in the report. NM Bone Scan Whole Body Result Date: 06/08/2023 CLINICAL DATA:  new left hip/thigh pain with RCC eval for bone metastases EXAM: NUCLEAR MEDICINE WHOLE BODY BONE SCAN TECHNIQUE: Whole body anterior and posterior images were obtained approximately 3 hours after intravenous injection of radiopharmaceutical. RADIOPHARMACEUTICALS:  21.9 mCi Technetium-36m MDP IV COMPARISON:  CT dated April 18, 2023 FINDINGS: There is focal uptake in  the RIGHT lateral eighth rib which corresponds to a likely pathologic fracture on recent CT. There is asymmetric uptake in the LEFT  iliac bone along the sacroiliac joint. There is low-level uptake in the LEFT lateral T6 vertebral body versus costovertebral junction and midline in the upper thoracic spine There is radiotracer uptake within the sternoclavicular joints, bilateral first CMCs, bilateral knees, bilateral feet and bilateral shoulders. This is favored to be degenerative in etiology. Expected physiologic distribution of radiotracer. Status post RIGHT nephrectomy. IMPRESSION: 1. Uptake within the LEFT iliac bone along the sacroiliac joint a suspicious for underlying osseous metastatic disease. Recommend consideration of dedicated MRI for further evaluation. 2. Uptake within the RIGHT lateral eighth rib is suspicious for a pathologic rib fracture but is technically indeterminate. 3. Low level uptake in the mid and upper thoracic spine is indeterminate but could also reflect additional sites of osseous metastatic disease. Recommend attention on follow-up. Electronically Signed   By: Meda Klinefelter M.D.   On: 06/08/2023 07:28

## 2023-06-27 NOTE — Assessment & Plan Note (Signed)
Stable for the past few weeks Monitor with renal function every cycle

## 2023-06-27 NOTE — Assessment & Plan Note (Signed)
 Continue lenvatinib started on 12/31 Continue with pembrolizumab lenvatinib at 10 mg daily for now Vit D 1000 international units with Calcium 1200 mg daily Patient has no dentaI issues and teeth.  Start Zometa 3 mg every 3 months.

## 2023-06-28 ENCOUNTER — Inpatient Hospital Stay: Payer: Medicare HMO

## 2023-06-28 ENCOUNTER — Other Ambulatory Visit (HOSPITAL_BASED_OUTPATIENT_CLINIC_OR_DEPARTMENT_OTHER): Payer: Self-pay

## 2023-06-28 ENCOUNTER — Inpatient Hospital Stay (HOSPITAL_BASED_OUTPATIENT_CLINIC_OR_DEPARTMENT_OTHER): Payer: Medicare HMO

## 2023-06-28 VITALS — BP 136/84 | HR 82 | Temp 97.8°F | Resp 14 | Wt 174.0 lb

## 2023-06-28 DIAGNOSIS — R630 Anorexia: Secondary | ICD-10-CM | POA: Diagnosis not present

## 2023-06-28 DIAGNOSIS — I639 Cerebral infarction, unspecified: Secondary | ICD-10-CM

## 2023-06-28 DIAGNOSIS — R110 Nausea: Secondary | ICD-10-CM | POA: Insufficient documentation

## 2023-06-28 DIAGNOSIS — Z9189 Other specified personal risk factors, not elsewhere classified: Secondary | ICD-10-CM

## 2023-06-28 DIAGNOSIS — N1832 Chronic kidney disease, stage 3b: Secondary | ICD-10-CM

## 2023-06-28 DIAGNOSIS — C7801 Secondary malignant neoplasm of right lung: Secondary | ICD-10-CM | POA: Insufficient documentation

## 2023-06-28 DIAGNOSIS — Z8673 Personal history of transient ischemic attack (TIA), and cerebral infarction without residual deficits: Secondary | ICD-10-CM | POA: Insufficient documentation

## 2023-06-28 DIAGNOSIS — Z905 Acquired absence of kidney: Secondary | ICD-10-CM | POA: Diagnosis not present

## 2023-06-28 DIAGNOSIS — I159 Secondary hypertension, unspecified: Secondary | ICD-10-CM | POA: Diagnosis not present

## 2023-06-28 DIAGNOSIS — C649 Malignant neoplasm of unspecified kidney, except renal pelvis: Secondary | ICD-10-CM

## 2023-06-28 DIAGNOSIS — Z5112 Encounter for antineoplastic immunotherapy: Secondary | ICD-10-CM | POA: Diagnosis not present

## 2023-06-28 DIAGNOSIS — C7802 Secondary malignant neoplasm of left lung: Secondary | ICD-10-CM | POA: Diagnosis not present

## 2023-06-28 DIAGNOSIS — C641 Malignant neoplasm of right kidney, except renal pelvis: Secondary | ICD-10-CM | POA: Insufficient documentation

## 2023-06-28 DIAGNOSIS — Z853 Personal history of malignant neoplasm of breast: Secondary | ICD-10-CM | POA: Diagnosis not present

## 2023-06-28 DIAGNOSIS — N183 Chronic kidney disease, stage 3 unspecified: Secondary | ICD-10-CM | POA: Insufficient documentation

## 2023-06-28 DIAGNOSIS — C7951 Secondary malignant neoplasm of bone: Secondary | ICD-10-CM | POA: Diagnosis not present

## 2023-06-28 DIAGNOSIS — C3412 Malignant neoplasm of upper lobe, left bronchus or lung: Secondary | ICD-10-CM | POA: Insufficient documentation

## 2023-06-28 DIAGNOSIS — I10 Essential (primary) hypertension: Secondary | ICD-10-CM | POA: Insufficient documentation

## 2023-06-28 DIAGNOSIS — M8580 Other specified disorders of bone density and structure, unspecified site: Secondary | ICD-10-CM

## 2023-06-28 LAB — CMP (CANCER CENTER ONLY)
ALT: 12 U/L (ref 0–44)
AST: 14 U/L — ABNORMAL LOW (ref 15–41)
Albumin: 3.8 g/dL (ref 3.5–5.0)
Alkaline Phosphatase: 83 U/L (ref 38–126)
Anion gap: 6 (ref 5–15)
BUN: 17 mg/dL (ref 8–23)
CO2: 28 mmol/L (ref 22–32)
Calcium: 9.1 mg/dL (ref 8.9–10.3)
Chloride: 103 mmol/L (ref 98–111)
Creatinine: 1.35 mg/dL — ABNORMAL HIGH (ref 0.44–1.00)
GFR, Estimated: 39 mL/min — ABNORMAL LOW (ref >60)
Glucose, Bld: 113 mg/dL — ABNORMAL HIGH (ref 70–99)
Potassium: 3.9 mmol/L (ref 3.5–5.1)
Sodium: 137 mmol/L (ref 135–145)
Total Bilirubin: 0.3 mg/dL (ref 0.0–1.2)
Total Protein: 6.6 g/dL (ref 6.5–8.1)

## 2023-06-28 LAB — CBC WITH DIFFERENTIAL (CANCER CENTER ONLY)
Abs Immature Granulocytes: 0.01 10*3/uL (ref 0.00–0.07)
Basophils Absolute: 0 10*3/uL (ref 0.0–0.1)
Basophils Relative: 1 %
Eosinophils Absolute: 0.2 10*3/uL (ref 0.0–0.5)
Eosinophils Relative: 3 %
HCT: 40.8 % (ref 36.0–46.0)
Hemoglobin: 12.8 g/dL (ref 12.0–15.0)
Immature Granulocytes: 0 %
Lymphocytes Relative: 39 %
Lymphs Abs: 1.9 10*3/uL (ref 0.7–4.0)
MCH: 26.6 pg (ref 26.0–34.0)
MCHC: 31.4 g/dL (ref 30.0–36.0)
MCV: 84.8 fL (ref 80.0–100.0)
Monocytes Absolute: 0.7 10*3/uL (ref 0.1–1.0)
Monocytes Relative: 13 %
Neutro Abs: 2.1 10*3/uL (ref 1.7–7.7)
Neutrophils Relative %: 44 %
Platelet Count: 174 10*3/uL (ref 150–400)
RBC: 4.81 MIL/uL (ref 3.87–5.11)
RDW: 16.4 % — ABNORMAL HIGH (ref 11.5–15.5)
WBC Count: 4.9 10*3/uL (ref 4.0–10.5)
nRBC: 0 % (ref 0.0–0.2)

## 2023-06-28 LAB — TOTAL PROTEIN, URINE DIPSTICK: Protein, ur: NEGATIVE mg/dL

## 2023-06-28 LAB — LACTATE DEHYDROGENASE: LDH: 158 U/L (ref 98–192)

## 2023-06-28 MED ORDER — SODIUM CHLORIDE 0.9 % IV SOLN: INTRAVENOUS | Status: DC

## 2023-06-28 MED ORDER — SODIUM CHLORIDE 0.9% FLUSH
10.0000 mL | Freq: Once | INTRAVENOUS | Status: AC
  Administered 2023-06-28: 10 mL

## 2023-06-28 MED ORDER — DRONABINOL 5 MG PO CAPS
5.0000 mg | ORAL_CAPSULE | Freq: Two times a day (BID) | ORAL | 0 refills | Status: DC
  Filled 2023-06-28 – 2023-07-03 (×4): qty 60, 30d supply, fill #0

## 2023-06-28 MED ORDER — SODIUM CHLORIDE 0.9% FLUSH
10.0000 mL | INTRAVENOUS | Status: DC | PRN
  Administered 2023-06-28: 10 mL

## 2023-06-28 MED ORDER — HEPARIN SOD (PORK) LOCK FLUSH 100 UNIT/ML IV SOLN
500.0000 [IU] | Freq: Once | INTRAVENOUS | Status: AC | PRN
  Administered 2023-06-28: 500 [IU]

## 2023-06-28 MED ORDER — ZOLEDRONIC ACID 4 MG/5ML IV CONC
3.0000 mg | Freq: Once | INTRAVENOUS | Status: AC
  Administered 2023-06-28: 3 mg via INTRAVENOUS
  Filled 2023-06-28: qty 3.75

## 2023-06-28 MED ORDER — SODIUM CHLORIDE 0.9 % IV SOLN
200.0000 mg | Freq: Once | INTRAVENOUS | Status: AC
  Administered 2023-06-28: 200 mg via INTRAVENOUS
  Filled 2023-06-28: qty 200

## 2023-06-28 NOTE — Assessment & Plan Note (Signed)
Will stop amlodipine for now.  Continue to monitor blood pressure at home.

## 2023-06-28 NOTE — Patient Instructions (Signed)
[facility] - A DEPT OF [facility]  Discharge Instructions: Thank you for choosing [facility] to provide your oncology and hematology care.   If you have a lab appointment with the Cancer Center, please go directly to the Cancer Center and check in at the registration area.   Wear comfortable clothing and clothing appropriate for easy access to any Portacath or PICC line.   We strive to give you quality time with your provider. You may need to reschedule your appointment if you arrive late (15 or more minutes).  Arriving late affects you and other patients whose appointments are after yours.  Also, if you miss three or more appointments without notifying the office, you may be dismissed from the clinic at the provider's discretion.      For prescription refill requests, have your pharmacy contact our office and allow 72 hours for refills to be completed.    Today you received the following chemotherapy and/or immunotherapy agents: Pembrolizumab  Rande Lawman)     To help prevent nausea and vomiting after your treatment, we encourage you to take your nausea medication as directed.  BELOW ARE SYMPTOMS THAT SHOULD BE REPORTED IMMEDIATELY: *FEVER GREATER THAN 100.4 F (38 C) OR HIGHER *CHILLS OR SWEATING *NAUSEA AND VOMITING THAT IS NOT CONTROLLED WITH YOUR NAUSEA MEDICATION *UNUSUAL SHORTNESS OF BREATH *UNUSUAL BRUISING OR BLEEDING *URINARY PROBLEMS (pain or burning when urinating, or frequent urination) *BOWEL PROBLEMS (unusual diarrhea, constipation, pain near the anus) TENDERNESS IN MOUTH AND THROAT WITH OR WITHOUT PRESENCE OF ULCERS (sore throat, sores in mouth, or a toothache) UNUSUAL RASH, SWELLING OR PAIN  UNUSUAL VAGINAL DISCHARGE OR ITCHING   Items with * indicate a potential emergency and should be followed up as soon as possible or go to the Emergency Department if any problems should occur.  Please show the CHEMOTHERAPY ALERT CARD or  IMMUNOTHERAPY ALERT CARD at check-in to the Emergency Department and triage nurse.  Should you have questions after your visit or need to cancel or reschedule your appointment, please contact [facility] - A DEPT OF [facility]  Dept: [phone]  and follow the prompts.  Office hours are 8:00 a.m. to 4:30 p.m. Monday - Friday. Please note that voicemails left after 4:00 p.m. may not be returned until the following business day.  We are closed weekends and major holidays. You have access to a nurse at all times for urgent questions. Please call the main number to the clinic Dept: [phone] and follow the prompts.   For any non-urgent questions, you may also contact your provider using MyChart. We now offer e-Visits for anyone 10 and older to request care online for non-urgent symptoms. For details visit [email or web address].   Also download the MyChart app! Go to the app store, search "MyChart", open the app, select [place], and log in with your MyChart username and password.  Zoledronic Acid Injection (Cancer) What is this medication? ZOLEDRONIC ACID (ZOE le dron ik AS id) treats high calcium levels in the blood caused by cancer. It may also be used with chemotherapy to treat weakened bones caused by cancer. It works by slowing down the release of calcium from bones. This lowers calcium levels in your blood. It also makes your bones stronger and less likely to break (fracture). It belongs to a group of medications called bisphosphonates. This medicine may be used for other purposes; ask your health care  provider or pharmacist if you have questions. COMMON BRAND NAME(S): Zometa, Zometa Powder What should I tell my care team before I take this medication? They need to know if you have any of these conditions: Dehydration Dental disease Kidney disease Liver disease Low levels of calcium in the blood Lung or breathing disease, such as asthma Receiving  steroids, such as dexamethasone or prednisone An unusual or allergic reaction to zoledronic acid, other medications, foods, dyes, or preservatives Pregnant or trying to get pregnant Breast-feeding How should I use this medication? This medication is injected into a vein. It is given by your care team in a hospital or clinic setting. Talk to your care team about the use of this medication in children. Special care may be needed. Overdosage: If you think you have taken too much of this medicine contact a poison control center or emergency room at once. NOTE: This medicine is only for you. Do not share this medicine with others. What if I miss a dose? Keep appointments for follow-up doses. It is important not to miss your dose. Call your care team if you are unable to keep an appointment. What may interact with this medication? Certain antibiotics given by injection Diuretics, such as bumetanide, furosemide NSAIDs, medications for pain and inflammation, such as ibuprofen or naproxen Teriparatide Thalidomide This list may not describe all possible interactions. Give your health care provider a list of all the medicines, herbs, non-prescription drugs, or dietary supplements you use. Also tell them if you smoke, drink alcohol, or use illegal drugs. Some items may interact with your medicine. What should I watch for while using this medication? Visit your care team for regular checks on your progress. It may be some time before you see the benefit from this medication. Some people who take this medication have severe bone, joint, or muscle pain. This medication may also increase your risk for jaw problems or a broken thigh bone. Tell your care team right away if you have severe pain in your jaw, bones, joints, or muscles. Tell you care team if you have any pain that does not go away or that gets worse. Tell your dentist and dental surgeon that you are taking this medication. You should not have major  dental surgery while on this medication. See your dentist to have a dental exam and fix any dental problems before starting this medication. Take good care of your teeth while on this medication. Make sure you see your dentist for regular follow-up appointments. You should make sure you get enough calcium and vitamin D while you are taking this medication. Discuss the foods you eat and the vitamins you take with your care team. Check with your care team if you have severe diarrhea, nausea, and vomiting, or if you sweat a lot. The loss of too much body fluid may make it dangerous for you to take this medication. You may need bloodwork while taking this medication. Talk to your care team if you wish to become pregnant or think you might be pregnant. This medication can cause serious birth defects. What side effects may I notice from receiving this medication? Side effects that you should report to your care team as soon as possible: Allergic reactions--skin rash, itching, hives, swelling of the face, lips, tongue, or throat Kidney injury--decrease in the amount of urine, swelling of the ankles, hands, or feet Low calcium level--muscle pain or cramps, confusion, tingling, or numbness in the hands or feet Osteonecrosis of the jaw--pain, swelling, or  redness in the mouth, numbness of the jaw, poor healing after dental work, unusual discharge from the mouth, visible bones in the mouth Severe bone, joint, or muscle pain Side effects that usually do not require medical attention (report to your care team if they continue or are bothersome): Constipation Fatigue Fever Loss of appetite Nausea Stomach pain This list may not describe all possible side effects. Call your doctor for medical advice about side effects. You may report side effects to FDA at 1-800-FDA-1088. Where should I keep my medication? This medication is given in a hospital or clinic. It will not be stored at home. NOTE: This sheet is a  summary. It may not cover all possible information. If you have questions about this medicine, talk to your doctor, pharmacist, or health care provider.  2024 Elsevier/Gold Standard (2021-06-16 00:00:00)

## 2023-06-30 ENCOUNTER — Other Ambulatory Visit: Payer: Self-pay

## 2023-07-01 ENCOUNTER — Other Ambulatory Visit (HOSPITAL_BASED_OUTPATIENT_CLINIC_OR_DEPARTMENT_OTHER): Payer: Self-pay

## 2023-07-03 ENCOUNTER — Other Ambulatory Visit (HOSPITAL_BASED_OUTPATIENT_CLINIC_OR_DEPARTMENT_OTHER): Payer: Self-pay

## 2023-07-05 ENCOUNTER — Other Ambulatory Visit (HOSPITAL_BASED_OUTPATIENT_CLINIC_OR_DEPARTMENT_OTHER): Payer: Self-pay

## 2023-07-09 ENCOUNTER — Telehealth: Payer: Self-pay

## 2023-07-09 NOTE — Telephone Encounter (Signed)
 TC from pt's daughter, asking if it is okay for pt to take Benadryl. She states that pt began itching "deep inside" at the site of her R mastectomy (2013) and that this happens "every few years." She denies rash. Daughter reports that pt was told the itching was nerve-related due to the mastectomy, and benadryl has helped her in the past. Informed that pt can take benadryl PRN as directed on label. Advised to call office if pt develops a rash and/or itching does not resolve w/ use of benadryl.

## 2023-07-12 ENCOUNTER — Other Ambulatory Visit: Payer: Self-pay

## 2023-07-18 ENCOUNTER — Ambulatory Visit: Payer: Medicare HMO

## 2023-07-18 ENCOUNTER — Other Ambulatory Visit: Payer: Self-pay

## 2023-07-18 ENCOUNTER — Other Ambulatory Visit: Payer: Medicare HMO

## 2023-07-19 ENCOUNTER — Inpatient Hospital Stay: Payer: Medicare HMO

## 2023-07-19 ENCOUNTER — Other Ambulatory Visit (HOSPITAL_BASED_OUTPATIENT_CLINIC_OR_DEPARTMENT_OTHER): Payer: Self-pay

## 2023-07-19 VITALS — BP 127/90 | HR 87 | Resp 16

## 2023-07-19 VITALS — BP 109/77 | HR 95 | Temp 97.9°F | Resp 16 | Ht 67.0 in | Wt 173.2 lb

## 2023-07-19 DIAGNOSIS — N1831 Chronic kidney disease, stage 3a: Secondary | ICD-10-CM | POA: Diagnosis not present

## 2023-07-19 DIAGNOSIS — C649 Malignant neoplasm of unspecified kidney, except renal pelvis: Secondary | ICD-10-CM | POA: Diagnosis not present

## 2023-07-19 DIAGNOSIS — N183 Chronic kidney disease, stage 3 unspecified: Secondary | ICD-10-CM | POA: Insufficient documentation

## 2023-07-19 DIAGNOSIS — Z853 Personal history of malignant neoplasm of breast: Secondary | ICD-10-CM | POA: Insufficient documentation

## 2023-07-19 DIAGNOSIS — C641 Malignant neoplasm of right kidney, except renal pelvis: Secondary | ICD-10-CM | POA: Insufficient documentation

## 2023-07-19 DIAGNOSIS — C7801 Secondary malignant neoplasm of right lung: Secondary | ICD-10-CM | POA: Diagnosis not present

## 2023-07-19 DIAGNOSIS — Z8673 Personal history of transient ischemic attack (TIA), and cerebral infarction without residual deficits: Secondary | ICD-10-CM | POA: Diagnosis not present

## 2023-07-19 DIAGNOSIS — Z5112 Encounter for antineoplastic immunotherapy: Secondary | ICD-10-CM | POA: Diagnosis not present

## 2023-07-19 DIAGNOSIS — Z905 Acquired absence of kidney: Secondary | ICD-10-CM | POA: Insufficient documentation

## 2023-07-19 DIAGNOSIS — R630 Anorexia: Secondary | ICD-10-CM

## 2023-07-19 DIAGNOSIS — R109 Unspecified abdominal pain: Secondary | ICD-10-CM | POA: Diagnosis not present

## 2023-07-19 DIAGNOSIS — D630 Anemia in neoplastic disease: Secondary | ICD-10-CM

## 2023-07-19 DIAGNOSIS — M8580 Other specified disorders of bone density and structure, unspecified site: Secondary | ICD-10-CM

## 2023-07-19 DIAGNOSIS — C7951 Secondary malignant neoplasm of bone: Secondary | ICD-10-CM | POA: Insufficient documentation

## 2023-07-19 DIAGNOSIS — I159 Secondary hypertension, unspecified: Secondary | ICD-10-CM

## 2023-07-19 DIAGNOSIS — C3412 Malignant neoplasm of upper lobe, left bronchus or lung: Secondary | ICD-10-CM | POA: Diagnosis not present

## 2023-07-19 DIAGNOSIS — C7802 Secondary malignant neoplasm of left lung: Secondary | ICD-10-CM | POA: Insufficient documentation

## 2023-07-19 DIAGNOSIS — I10 Essential (primary) hypertension: Secondary | ICD-10-CM | POA: Diagnosis not present

## 2023-07-19 LAB — CBC WITH DIFFERENTIAL (CANCER CENTER ONLY)
Abs Immature Granulocytes: 0 10*3/uL (ref 0.00–0.07)
Basophils Absolute: 0 10*3/uL (ref 0.0–0.1)
Basophils Relative: 0 %
Eosinophils Absolute: 0.1 10*3/uL (ref 0.0–0.5)
Eosinophils Relative: 2 %
HCT: 43.4 % (ref 36.0–46.0)
Hemoglobin: 13.7 g/dL (ref 12.0–15.0)
Immature Granulocytes: 0 %
Lymphocytes Relative: 31 %
Lymphs Abs: 1.5 10*3/uL (ref 0.7–4.0)
MCH: 27.2 pg (ref 26.0–34.0)
MCHC: 31.6 g/dL (ref 30.0–36.0)
MCV: 86.3 fL (ref 80.0–100.0)
Monocytes Absolute: 0.7 10*3/uL (ref 0.1–1.0)
Monocytes Relative: 13 %
Neutro Abs: 2.7 10*3/uL (ref 1.7–7.7)
Neutrophils Relative %: 54 %
Platelet Count: 206 10*3/uL (ref 150–400)
RBC: 5.03 MIL/uL (ref 3.87–5.11)
RDW: 14.9 % (ref 11.5–15.5)
WBC Count: 5 10*3/uL (ref 4.0–10.5)
nRBC: 0 % (ref 0.0–0.2)

## 2023-07-19 LAB — CMP (CANCER CENTER ONLY)
ALT: 15 U/L (ref 0–44)
AST: 18 U/L (ref 15–41)
Albumin: 4 g/dL (ref 3.5–5.0)
Alkaline Phosphatase: 76 U/L (ref 38–126)
Anion gap: 5 (ref 5–15)
BUN: 19 mg/dL (ref 8–23)
CO2: 26 mmol/L (ref 22–32)
Calcium: 8.8 mg/dL — ABNORMAL LOW (ref 8.9–10.3)
Chloride: 108 mmol/L (ref 98–111)
Creatinine: 1.16 mg/dL — ABNORMAL HIGH (ref 0.44–1.00)
GFR, Estimated: 47 mL/min — ABNORMAL LOW (ref >60)
Glucose, Bld: 103 mg/dL — ABNORMAL HIGH (ref 70–99)
Potassium: 4.1 mmol/L (ref 3.5–5.1)
Sodium: 139 mmol/L (ref 135–145)
Total Bilirubin: 0.3 mg/dL (ref 0.0–1.2)
Total Protein: 7.4 g/dL (ref 6.5–8.1)

## 2023-07-19 LAB — TOTAL PROTEIN, URINE DIPSTICK: Protein, ur: NEGATIVE mg/dL

## 2023-07-19 LAB — LACTATE DEHYDROGENASE: LDH: 177 U/L (ref 98–192)

## 2023-07-19 MED ORDER — SODIUM CHLORIDE 0.9% FLUSH
10.0000 mL | INTRAVENOUS | Status: DC | PRN
  Administered 2023-07-19: 10 mL

## 2023-07-19 MED ORDER — DRONABINOL 5 MG PO CAPS
5.0000 mg | ORAL_CAPSULE | Freq: Two times a day (BID) | ORAL | 0 refills | Status: DC
  Filled 2023-07-19 – 2023-08-02 (×2): qty 60, 30d supply, fill #0

## 2023-07-19 MED ORDER — SODIUM CHLORIDE 0.9 % IV SOLN
INTRAVENOUS | Status: DC
Start: 2023-07-19 — End: 2023-07-19

## 2023-07-19 MED ORDER — HEPARIN SOD (PORK) LOCK FLUSH 100 UNIT/ML IV SOLN
500.0000 [IU] | Freq: Once | INTRAVENOUS | Status: AC | PRN
  Administered 2023-07-19: 500 [IU]

## 2023-07-19 MED ORDER — SODIUM CHLORIDE 0.9 % IV SOLN
200.0000 mg | Freq: Once | INTRAVENOUS | Status: AC
  Administered 2023-07-19: 200 mg via INTRAVENOUS
  Filled 2023-07-19: qty 200

## 2023-07-19 MED ORDER — SODIUM CHLORIDE 0.9% FLUSH
10.0000 mL | Freq: Once | INTRAVENOUS | Status: AC | PRN
  Administered 2023-07-19: 10 mL

## 2023-07-19 NOTE — Assessment & Plan Note (Signed)
 Now resolved. Hgb normal today Monitor closely for any bleeding Repeat CBC with lab appointment before next visit.

## 2023-07-19 NOTE — Assessment & Plan Note (Signed)
 Continue Marinol 5 mg twice daily. Refilled Prescription today sent to [facility].

## 2023-07-19 NOTE — Assessment & Plan Note (Addendum)
 Continue lenvatinib started on 12/31 Continue with pembrolizumab lenvatinib at 10 mg daily for now Vit D 1000 international units with Calcium 1200 mg daily Patient has no dentaI issues and teeth.  Start Zometa 3 mg every 3 months. Repeat CT CAP and MRI of the brain ordered

## 2023-07-19 NOTE — Assessment & Plan Note (Signed)
 Will hold amlodipine for now.  Continue to monitor blood pressure at home.

## 2023-07-19 NOTE — Assessment & Plan Note (Signed)
 Stable for the past few weeks Monitor with renal function every cycle

## 2023-07-19 NOTE — Assessment & Plan Note (Signed)
 Currently not a candidate for anticoagulation or aspirin due to high risk of bleeding and hemorrhage and wound not completely healed Asymptomatic.  Will continue monitoring.

## 2023-07-19 NOTE — Patient Instructions (Signed)
 [facility] - A DEPT OF [facility]  Discharge Instructions: Thank you for choosing [facility] to provide your oncology and hematology care.   If you have a lab appointment with the Cancer Center, please go directly to the Cancer Center and check in at the registration area.   Wear comfortable clothing and clothing appropriate for easy access to any Portacath or PICC line.   We strive to give you quality time with your provider. You may need to reschedule your appointment if you arrive late (15 or more minutes).  Arriving late affects you and other patients whose appointments are after yours.  Also, if you miss three or more appointments without notifying the office, you may be dismissed from the clinic at the provider's discretion.      For prescription refill requests, have your pharmacy contact our office and allow 72 hours for refills to be completed.    Today you received the following chemotherapy and/or immunotherapy agents: Pembrolizumab  Rande Lawman)     To help prevent nausea and vomiting after your treatment, we encourage you to take your nausea medication as directed.  BELOW ARE SYMPTOMS THAT SHOULD BE REPORTED IMMEDIATELY: *FEVER GREATER THAN 100.4 F (38 C) OR HIGHER *CHILLS OR SWEATING *NAUSEA AND VOMITING THAT IS NOT CONTROLLED WITH YOUR NAUSEA MEDICATION *UNUSUAL SHORTNESS OF BREATH *UNUSUAL BRUISING OR BLEEDING *URINARY PROBLEMS (pain or burning when urinating, or frequent urination) *BOWEL PROBLEMS (unusual diarrhea, constipation, pain near the anus) TENDERNESS IN MOUTH AND THROAT WITH OR WITHOUT PRESENCE OF ULCERS (sore throat, sores in mouth, or a toothache) UNUSUAL RASH, SWELLING OR PAIN  UNUSUAL VAGINAL DISCHARGE OR ITCHING   Items with * indicate a potential emergency and should be followed up as soon as possible or go to the Emergency Department if any problems should occur.  Please show the CHEMOTHERAPY ALERT CARD or  IMMUNOTHERAPY ALERT CARD at check-in to the Emergency Department and triage nurse.  Should you have questions after your visit or need to cancel or reschedule your appointment, please contact [facility] - A DEPT OF [facility]  Dept: [phone]  and follow the prompts.  Office hours are 8:00 a.m. to 4:30 p.m. Monday - Friday. Please note that voicemails left after 4:00 p.m. may not be returned until the following business day.  We are closed weekends and major holidays. You have access to a nurse at all times for urgent questions. Please call the main number to the clinic Dept: [phone] and follow the prompts.   For any non-urgent questions, you may also contact your provider using MyChart. We now offer e-Visits for anyone 10 and older to request care online for non-urgent symptoms. For details visit [email or web address].   Also download the MyChart app! Go to the app store, search "MyChart", open the app, select [place], and log in with your MyChart username and password.  Zoledronic Acid Injection (Cancer) What is this medication? ZOLEDRONIC ACID (ZOE le dron ik AS id) treats high calcium levels in the blood caused by cancer. It may also be used with chemotherapy to treat weakened bones caused by cancer. It works by slowing down the release of calcium from bones. This lowers calcium levels in your blood. It also makes your bones stronger and less likely to break (fracture). It belongs to a group of medications called bisphosphonates. This medicine may be used for other purposes; ask your health care  provider or pharmacist if you have questions. COMMON BRAND NAME(S): Zometa, Zometa Powder What should I tell my care team before I take this medication? They need to know if you have any of these conditions: Dehydration Dental disease Kidney disease Liver disease Low levels of calcium in the blood Lung or breathing disease, such as asthma Receiving  steroids, such as dexamethasone or prednisone An unusual or allergic reaction to zoledronic acid, other medications, foods, dyes, or preservatives Pregnant or trying to get pregnant Breast-feeding How should I use this medication? This medication is injected into a vein. It is given by your care team in a hospital or clinic setting. Talk to your care team about the use of this medication in children. Special care may be needed. Overdosage: If you think you have taken too much of this medicine contact a poison control center or emergency room at once. NOTE: This medicine is only for you. Do not share this medicine with others. What if I miss a dose? Keep appointments for follow-up doses. It is important not to miss your dose. Call your care team if you are unable to keep an appointment. What may interact with this medication? Certain antibiotics given by injection Diuretics, such as bumetanide, furosemide NSAIDs, medications for pain and inflammation, such as ibuprofen or naproxen Teriparatide Thalidomide This list may not describe all possible interactions. Give your health care provider a list of all the medicines, herbs, non-prescription drugs, or dietary supplements you use. Also tell them if you smoke, drink alcohol, or use illegal drugs. Some items may interact with your medicine. What should I watch for while using this medication? Visit your care team for regular checks on your progress. It may be some time before you see the benefit from this medication. Some people who take this medication have severe bone, joint, or muscle pain. This medication may also increase your risk for jaw problems or a broken thigh bone. Tell your care team right away if you have severe pain in your jaw, bones, joints, or muscles. Tell you care team if you have any pain that does not go away or that gets worse. Tell your dentist and dental surgeon that you are taking this medication. You should not have major  dental surgery while on this medication. See your dentist to have a dental exam and fix any dental problems before starting this medication. Take good care of your teeth while on this medication. Make sure you see your dentist for regular follow-up appointments. You should make sure you get enough calcium and vitamin D while you are taking this medication. Discuss the foods you eat and the vitamins you take with your care team. Check with your care team if you have severe diarrhea, nausea, and vomiting, or if you sweat a lot. The loss of too much body fluid may make it dangerous for you to take this medication. You may need bloodwork while taking this medication. Talk to your care team if you wish to become pregnant or think you might be pregnant. This medication can cause serious birth defects. What side effects may I notice from receiving this medication? Side effects that you should report to your care team as soon as possible: Allergic reactions--skin rash, itching, hives, swelling of the face, lips, tongue, or throat Kidney injury--decrease in the amount of urine, swelling of the ankles, hands, or feet Low calcium level--muscle pain or cramps, confusion, tingling, or numbness in the hands or feet Osteonecrosis of the jaw--pain, swelling, or  redness in the mouth, numbness of the jaw, poor healing after dental work, unusual discharge from the mouth, visible bones in the mouth Severe bone, joint, or muscle pain Side effects that usually do not require medical attention (report to your care team if they continue or are bothersome): Constipation Fatigue Fever Loss of appetite Nausea Stomach pain This list may not describe all possible side effects. Call your doctor for medical advice about side effects. You may report side effects to FDA at 1-800-FDA-1088. Where should I keep my medication? This medication is given in a hospital or clinic. It will not be stored at home. NOTE: This sheet is a  summary. It may not cover all possible information. If you have questions about this medicine, talk to your doctor, pharmacist, or health care provider.  2024 Elsevier/Gold Standard (2021-06-16 00:00:00)

## 2023-07-19 NOTE — Progress Notes (Signed)
 Patient Care Team: Donita Brooks, MD as PCP - General (Family Medicine)  Clinic Day:  07/19/2023  Referring physician: [name], MD  ASSESSMENT & PLAN:   Assessment & Plan: 83 y.o.w. 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   Diagnosis: stage IV clear cell RCC Treatment: Right nephrectomy. 04/26/23 started pembrolizumab. Delayed started of Lenvatinib at 10 mg daily due to wound on 05/07/23   Complete 3 cycles of pembro now.   She gets call for lenvatinib.   Overall improving and more functioning. Now ambulating by herself without wheelchair anymore! Excellent improvement.   Will repeat CT CAP and MRI of brain in late March.   Refilled marinol today.  Discussed precautions on supplements.  Just because they reported natural does not mean there are no side effects.  We do not know the exact dosage that is needed for efficacy versus toxicity.  All supplements and medication has side effects potentially.  Metastatic renal cell carcinoma to bone (HCC) Continue lenvatinib started on 12/31 Continue with pembrolizumab lenvatinib at 10 mg daily for now Vit D 1000 international units with Calcium 1200 mg daily Patient has no dentaI issues and teeth.  Start Zometa 3 mg every 3 months. Repeat CT CAP and MRI of the brain ordered  CKD (chronic kidney disease) stage 3, GFR 30-59 ml/min (HCC) Stable for the past few weeks Monitor with renal function every cycle  Decreased appetite Continue Marinol 5 mg twice daily. Refilled Prescription today sent to [facility].   Anemia in neoplastic disease Now resolved. Hgb normal today Monitor closely for any bleeding Repeat CBC with lab appointment before next visit.  Hypertension Will hold amlodipine for now.  Continue to monitor blood pressure at home.  History of stroke Currently not a candidate for anticoagulation or  aspirin due to high risk of bleeding and hemorrhage and wound not completely healed Asymptomatic.  Will continue monitoring.    The patient understands the plans discussed today and is in agreement with them.  She knows to contact our office if she develops concerns prior to her next appointment.  [name], MD  [facility] - A DEPT OF [facility] [address] [place] [address] Dept: [phone] Dept Fax: [phone]   Orders Placed This Encounter  Procedures   MR Brain W Wo Contrast    Standing Status:   Future    Expected Date:   08/02/2023    Expiration Date:   07/18/2024    If indicated for the ordered procedure, I authorize the administration of contrast media per Radiology protocol:   Yes    What is the patient's sedation requirement?:   No Sedation    Does the patient have a pacemaker or implanted devices?:   No    Use SRS Protocol?:   Yes    Preferred imaging location?:   [facility] (table limit - 550 lbs)   CT CHEST ABDOMEN PELVIS W CONTRAST    Standing Status:   Future    Expected Date:   08/02/2023    Expiration Date:   07/18/2024    If indicated for the ordered procedure, I authorize the administration of contrast media per Radiology protocol:   Yes    Does the patient have a contrast media/X-ray dye allergy?:   No  Preferred imaging location?:   [facility]    If indicated for the ordered procedure, I authorize the administration of oral contrast media per Radiology protocol:   Yes      CHIEF COMPLAINT:  CC: stage IV RCC  Current Treatment:  len/pembro  INTERVAL HISTORY:  Diane Paul is here today for repeat clinical assessment. She denies fevers, nausea, vomiting, coughing, chest pain, headache or any neurologic changes. She report mid abdominal pain.  No constipation but is using stool softener twice a day.  Stool is on the looser side.  Her appetite is better now on  Marinol as she still needs refill.  She is walking by herself.  I have reviewed the past medical history, past surgical history, social history and family history with the patient and they are unchanged from previous note.  ALLERGIES:  is allergic to fosamax [alendronate sodium].  MEDICATIONS:  Current Outpatient Medications  Medication Sig Dispense Refill   diphenhydrAMINE (BENADRYL) 25 mg capsule Take 25 mg by mouth every 6 (six) hours as needed for itching.     dronabinol (MARINOL) 5 MG capsule Take 1 capsule (5 mg total) by mouth 2 (two) times daily before a meal. 60 capsule 0   Ensure (ENSURE) Take 237 mLs by mouth 2 (two) times daily between meals.     lenvatinib 10 mg daily dose (LENVIMA) capsule Take 1 capsule (10 mg total) by mouth daily. 30 capsule 0   lidocaine-prilocaine (EMLA) cream Apply 1 Application topically as needed. Apply to port ~ 1 hour prior to access 30 g 0   ondansetron (ZOFRAN) 8 MG tablet Take 1 tablet (8 mg total) by mouth every 8 (eight) hours as needed for nausea or vomiting. 30 tablet 1   prochlorperazine (COMPAZINE) 10 MG tablet Take 1 tablet (10 mg total) by mouth every 6 (six) hours as needed for nausea or vomiting. 30 tablet 1   senna-docusate (SENOKOT-S) 8.6-50 MG tablet Take 1 tablet by mouth 2 (two) times daily. While taking strong pain meds to prevent constipation 10 tablet 0   No current facility-administered medications for this visit.    HISTORY OF PRESENT ILLNESS:   Oncology History  History of breast cancer  06/11/2011 Surgery   Right breast mastectomy with right axillary sentinel lymph node biopsy 1.3 cm invasive lobular cancer, grade 1, atypical lobular hyperplasia, focal LV I, ER 91%, PR 25%, HER-2 negative, 0/8 lymph nodes T1 cN0 stage IA   07/25/2011 -  Anti-estrogen oral therapy   Letrozole 2.5 mg daily 7 years was a plan   Metastatic renal cell carcinoma to bone (HCC)  02/01/2023 Initial Diagnosis   Renal cell carcinoma (HCC)    02/05/2023 Imaging   Bone scan: No evidence of osseous metastatic disease.    04/03/2023 Pathology Results   A. KIDNEY, RIGHT, RADICAL NEPHRECTOMY:  Clear cell renal cell carcinoma with rhabdoid, sarcomatoid Features and  tumor necrosis, nuclear grade 4, size 12.0 cm  Tumor extends into perinephric, renal sinus fat and segmental branches  of renal vein (pT3a)  Ureteral, vascular and all margins of resection are negative for tumor    Benign adrenal gland    04/23/2023 - 04/23/2023 Chemotherapy   Patient is on Treatment Plan : RENAL CELL Pembrolizumab (200) + Axitinib q21d     04/25/2023 -  Chemotherapy   Patient is on Treatment Plan : Clear Cell RCC Pembrolizumab (200) q21d     NSCLC of left lung (HCC)  03/18/2023 Initial Diagnosis   NSCLC of  left lung (HCC)   06/07/2023 Cancer Staging   Staging form: Lung, AJCC 8th Edition - Clinical: cT1, cN1, cM0 - Signed by [name], MD on 06/07/2023 Stage prefix: Initial diagnosis Histologic grade (G): GX Histologic grading system: 4 grade system       REVIEW OF SYSTEMS:   All relevant systems were reviewed with the patient and are negative.   VITALS:  Blood pressure 109/77, pulse 95, temperature 97.9 F (36.6 C), temperature source Temporal, resp. rate 16, height 5\' 7"  (1.702 m), weight 173 lb 3.2 oz (78.6 kg), SpO2 97%.  Wt Readings from Last 3 Encounters:  07/19/23 173 lb 3.2 oz (78.6 kg)  06/28/23 174 lb (78.9 kg)  06/07/23 177 lb 8 oz (80.5 kg)    Body mass index is 27.13 kg/m.  Performance status (ECOG): 1 - Symptomatic but completely ambulatory  PHYSICAL EXAM:   GENERAL:alert, no distress and comfortable SKIN: skin color normal, no rashes on exposed skin OROPHARYNX: no exudate, no erythema    NECK: supple,  non-tender, without nodularity LYMPH:  no palpable cervical lymphadenopathy LUNGS: clear to auscultation with normal breathing effort.  No wheeze or rales HEART: regular rate & rhythm and no murmurs and  no lower extremity edema ABDOMEN: abdomen soft, non-tender and nondistended Musculoskeletal: no edema NEURO: alert, fluent speech, no focal motor/sensory deficits.  Strength and sensation equal bilaterally.  LABORATORY DATA:  I have reviewed the data as listed    Component Value Date/Time   NA 137 06/28/2023 0743   NA 141 08/05/2015 0952   K 3.9 06/28/2023 0743   K 4.1 08/05/2015 0952   CL 103 06/28/2023 0743   CL 108 (H) 09/19/2012 1156   CO2 28 06/28/2023 0743   CO2 24 08/05/2015 0952   GLUCOSE 113 (H) 06/28/2023 0743   GLUCOSE 109 08/05/2015 0952   GLUCOSE 101 (H) 09/19/2012 1156   BUN 17 06/28/2023 0743   BUN 20.9 08/05/2015 0952   CREATININE 1.35 (H) 06/28/2023 0743   CREATININE 0.72 03/08/2023 1236   CREATININE 1.1 08/05/2015 0952   CALCIUM 9.1 06/28/2023 0743   CALCIUM 9.4 08/05/2015 0952   PROT 6.6 06/28/2023 0743   PROT 7.8 08/05/2015 0952   ALBUMIN 3.8 06/28/2023 0743   ALBUMIN 3.7 08/05/2015 0952   AST 14 (L) 06/28/2023 0743   AST 16 08/05/2015 0952   ALT 12 06/28/2023 0743   ALT 17 08/05/2015 0952   ALKPHOS 83 06/28/2023 0743   ALKPHOS 104 08/05/2015 0952   BILITOT 0.3 06/28/2023 0743   BILITOT <0.30 08/05/2015 0952   GFRNONAA 39 (L) 06/28/2023 0743   GFRNONAA 39 (L) 08/12/2020 1502   GFRAA 45 (L) 08/12/2020 1502    No results found for: "SPEP", "UPEP"  Lab Results  Component Value Date   WBC 5.0 07/19/2023   NEUTROABS 2.7 07/19/2023   HGB 13.7 07/19/2023   HCT 43.4 07/19/2023   MCV 86.3 07/19/2023   PLT 206 07/19/2023      Chemistry      Component Value Date/Time   NA 137 06/28/2023 0743   NA 141 08/05/2015 0952   K 3.9 06/28/2023 0743   K 4.1 08/05/2015 0952   CL 103 06/28/2023 0743   CL 108 (H) 09/19/2012 1156   CO2 28 06/28/2023 0743   CO2 24 08/05/2015 0952   BUN 17 06/28/2023 0743   BUN 20.9 08/05/2015 0952   CREATININE 1.35 (H) 06/28/2023 0743   CREATININE 0.72 03/08/2023 1236   CREATININE 1.1 08/05/2015 1610  Component  Value Date/Time   CALCIUM 9.1 06/28/2023 0743   CALCIUM 9.4 08/05/2015 0952   ALKPHOS 83 06/28/2023 0743   ALKPHOS 104 08/05/2015 0952   AST 14 (L) 06/28/2023 0743   AST 16 08/05/2015 0952   ALT 12 06/28/2023 0743   ALT 17 08/05/2015 0952   BILITOT 0.3 06/28/2023 0743   BILITOT <0.30 08/05/2015 8295       RADIOGRAPHIC STUDIES: I have personally reviewed the radiological images as listed and agreed with the findings in the report. No results found.

## 2023-07-22 ENCOUNTER — Other Ambulatory Visit: Payer: Self-pay

## 2023-07-24 ENCOUNTER — Other Ambulatory Visit: Payer: Self-pay

## 2023-07-24 DIAGNOSIS — C641 Malignant neoplasm of right kidney, except renal pelvis: Secondary | ICD-10-CM

## 2023-07-24 NOTE — Progress Notes (Signed)
 Specialty Pharmacy Refill Coordination Note  Diane Paul is a 83 y.o. female contacted today regarding refills of specialty medication(s) Lenvatinib Mesylate [facility])   Spoke with patient's daughter  Patient requested Delivery   Delivery date: 07/30/23   Verified address: [address]   [place] [address]   Medication will be filled on 03.24.25.   This fill date is pending response to refill request from provider. Patient is aware and if they have not received fill by intended date they must follow up with pharmacy.

## 2023-07-25 ENCOUNTER — Other Ambulatory Visit (HOSPITAL_COMMUNITY): Payer: Self-pay

## 2023-07-25 MED ORDER — LENVATINIB (10 MG DAILY DOSE) 10 MG PO CPPK
10.0000 mg | ORAL_CAPSULE | Freq: Every day | ORAL | 0 refills | Status: DC
  Filled 2023-07-25: qty 30, 30d supply, fill #0

## 2023-08-02 ENCOUNTER — Ambulatory Visit (HOSPITAL_COMMUNITY)
Admission: RE | Admit: 2023-08-02 | Discharge: 2023-08-02 | Disposition: A | Discharge status: Home / Self Care | Source: Ambulatory Visit

## 2023-08-02 ENCOUNTER — Other Ambulatory Visit: Payer: Self-pay

## 2023-08-02 ENCOUNTER — Other Ambulatory Visit (HOSPITAL_BASED_OUTPATIENT_CLINIC_OR_DEPARTMENT_OTHER): Payer: Self-pay

## 2023-08-02 DIAGNOSIS — C649 Malignant neoplasm of unspecified kidney, except renal pelvis: Secondary | ICD-10-CM | POA: Insufficient documentation

## 2023-08-02 DIAGNOSIS — R9089 Other abnormal findings on diagnostic imaging of central nervous system: Secondary | ICD-10-CM | POA: Diagnosis not present

## 2023-08-02 DIAGNOSIS — M8488 Other disorders of continuity of bone, other site: Secondary | ICD-10-CM | POA: Diagnosis not present

## 2023-08-02 DIAGNOSIS — C7951 Secondary malignant neoplasm of bone: Secondary | ICD-10-CM | POA: Diagnosis not present

## 2023-08-02 DIAGNOSIS — K8020 Calculus of gallbladder without cholecystitis without obstruction: Secondary | ICD-10-CM | POA: Diagnosis not present

## 2023-08-02 MED ORDER — IOHEXOL 300 MG/ML  SOLN
80.0000 mL | Freq: Once | INTRAMUSCULAR | Status: AC | PRN
  Administered 2023-08-02: 80 mL via INTRAVENOUS

## 2023-08-02 MED ORDER — GADOBUTROL 1 MMOL/ML IV SOLN
7.0000 mL | Freq: Once | INTRAVENOUS | Status: AC | PRN
  Administered 2023-08-02: 7 mL via INTRAVENOUS

## 2023-08-02 MED ORDER — HEPARIN SOD (PORK) LOCK FLUSH 100 UNIT/ML IV SOLN
500.0000 [IU] | Freq: Once | INTRAVENOUS | Status: AC
  Administered 2023-08-02: 500 [IU] via INTRAVENOUS

## 2023-08-07 ENCOUNTER — Telehealth: Payer: Self-pay

## 2023-08-07 NOTE — Telephone Encounter (Signed)
 Returned patient's call. Patient left a VM to reschedule appt on 4/4.

## 2023-08-07 NOTE — Telephone Encounter (Signed)
 TC from pt's daughter, stating pt has been sitting outside and has a sore throat. She wants to know if she can give her some Benadryl. Pt denies sneezing, runny nose, or other allergy-type symptoms. Advised daughter to look at pt's throat and to check her temperature. Informed of s/s of strep throat and instructed to call PCP if any of these are present. Otherwise, instructed to try Tylenol and/or ibuprofen for pain unless contraindicated by provider, and stated that OTC antihistamine may help if sore throat is allergy/pollen-related. Pt's daughter verbalizes understanding and states she will call back for further questions/concerns or if sore throat is unresolved.

## 2023-08-09 ENCOUNTER — Other Ambulatory Visit (HOSPITAL_BASED_OUTPATIENT_CLINIC_OR_DEPARTMENT_OTHER): Payer: Self-pay

## 2023-08-09 ENCOUNTER — Other Ambulatory Visit: Payer: Self-pay

## 2023-08-09 ENCOUNTER — Inpatient Hospital Stay: Payer: Medicare HMO

## 2023-08-09 VITALS — BP 140/77 | HR 73 | Temp 97.9°F | Resp 16 | Wt 171.5 lb

## 2023-08-09 DIAGNOSIS — R630 Anorexia: Secondary | ICD-10-CM | POA: Diagnosis not present

## 2023-08-09 DIAGNOSIS — Z905 Acquired absence of kidney: Secondary | ICD-10-CM | POA: Diagnosis not present

## 2023-08-09 DIAGNOSIS — Z5112 Encounter for antineoplastic immunotherapy: Secondary | ICD-10-CM | POA: Insufficient documentation

## 2023-08-09 DIAGNOSIS — C641 Malignant neoplasm of right kidney, except renal pelvis: Secondary | ICD-10-CM | POA: Insufficient documentation

## 2023-08-09 DIAGNOSIS — C3412 Malignant neoplasm of upper lobe, left bronchus or lung: Secondary | ICD-10-CM | POA: Diagnosis not present

## 2023-08-09 DIAGNOSIS — Z9189 Other specified personal risk factors, not elsewhere classified: Secondary | ICD-10-CM

## 2023-08-09 DIAGNOSIS — Z853 Personal history of malignant neoplasm of breast: Secondary | ICD-10-CM | POA: Diagnosis not present

## 2023-08-09 DIAGNOSIS — C649 Malignant neoplasm of unspecified kidney, except renal pelvis: Secondary | ICD-10-CM

## 2023-08-09 DIAGNOSIS — Z8673 Personal history of transient ischemic attack (TIA), and cerebral infarction without residual deficits: Secondary | ICD-10-CM | POA: Diagnosis not present

## 2023-08-09 DIAGNOSIS — N1832 Chronic kidney disease, stage 3b: Secondary | ICD-10-CM | POA: Insufficient documentation

## 2023-08-09 DIAGNOSIS — C7951 Secondary malignant neoplasm of bone: Secondary | ICD-10-CM

## 2023-08-09 DIAGNOSIS — C7801 Secondary malignant neoplasm of right lung: Secondary | ICD-10-CM | POA: Diagnosis not present

## 2023-08-09 DIAGNOSIS — K635 Polyp of colon: Secondary | ICD-10-CM

## 2023-08-09 DIAGNOSIS — C7802 Secondary malignant neoplasm of left lung: Secondary | ICD-10-CM | POA: Diagnosis not present

## 2023-08-09 DIAGNOSIS — I10 Essential (primary) hypertension: Secondary | ICD-10-CM | POA: Diagnosis not present

## 2023-08-09 DIAGNOSIS — M8580 Other specified disorders of bone density and structure, unspecified site: Secondary | ICD-10-CM

## 2023-08-09 LAB — CBC WITH DIFFERENTIAL (CANCER CENTER ONLY)
Abs Immature Granulocytes: 0.01 10*3/uL (ref 0.00–0.07)
Basophils Absolute: 0 10*3/uL (ref 0.0–0.1)
Basophils Relative: 0 %
Eosinophils Absolute: 0.1 10*3/uL (ref 0.0–0.5)
Eosinophils Relative: 1 %
HCT: 40.7 % (ref 36.0–46.0)
Hemoglobin: 12.8 g/dL (ref 12.0–15.0)
Immature Granulocytes: 0 %
Lymphocytes Relative: 28 %
Lymphs Abs: 1.4 10*3/uL (ref 0.7–4.0)
MCH: 27.4 pg (ref 26.0–34.0)
MCHC: 31.4 g/dL (ref 30.0–36.0)
MCV: 87 fL (ref 80.0–100.0)
Monocytes Absolute: 0.7 10*3/uL (ref 0.1–1.0)
Monocytes Relative: 14 %
Neutro Abs: 2.8 10*3/uL (ref 1.7–7.7)
Neutrophils Relative %: 57 %
Platelet Count: 243 10*3/uL (ref 150–400)
RBC: 4.68 MIL/uL (ref 3.87–5.11)
RDW: 13.5 % (ref 11.5–15.5)
WBC Count: 4.9 10*3/uL (ref 4.0–10.5)
nRBC: 0 % (ref 0.0–0.2)

## 2023-08-09 LAB — CMP (CANCER CENTER ONLY)
ALT: 10 U/L (ref 0–44)
AST: 12 U/L — ABNORMAL LOW (ref 15–41)
Albumin: 3.8 g/dL (ref 3.5–5.0)
Alkaline Phosphatase: 60 U/L (ref 38–126)
Anion gap: 6 (ref 5–15)
BUN: 17 mg/dL (ref 8–23)
CO2: 28 mmol/L (ref 22–32)
Calcium: 8.7 mg/dL — ABNORMAL LOW (ref 8.9–10.3)
Chloride: 104 mmol/L (ref 98–111)
Creatinine: 1.12 mg/dL — ABNORMAL HIGH (ref 0.44–1.00)
GFR, Estimated: 49 mL/min — ABNORMAL LOW (ref >60)
Glucose, Bld: 99 mg/dL (ref 70–99)
Potassium: 3.9 mmol/L (ref 3.5–5.1)
Sodium: 138 mmol/L (ref 135–145)
Total Bilirubin: 0.3 mg/dL (ref 0.0–1.2)
Total Protein: 7.4 g/dL (ref 6.5–8.1)

## 2023-08-09 LAB — TSH: TSH: 0.12 u[IU]/mL — ABNORMAL LOW (ref 0.350–4.500)

## 2023-08-09 LAB — TOTAL PROTEIN, URINE DIPSTICK: Protein, ur: NEGATIVE mg/dL

## 2023-08-09 LAB — LACTATE DEHYDROGENASE: LDH: 172 U/L (ref 98–192)

## 2023-08-09 LAB — T4, FREE: Free T4: 2.75 ng/dL — ABNORMAL HIGH (ref 0.61–1.12)

## 2023-08-09 MED ORDER — SODIUM CHLORIDE 0.9% FLUSH
10.0000 mL | Freq: Once | INTRAVENOUS | Status: AC
Start: 2023-08-09 — End: 2023-08-09
  Administered 2023-08-09: 10 mL

## 2023-08-09 MED ORDER — HEPARIN SOD (PORK) LOCK FLUSH 100 UNIT/ML IV SOLN
500.0000 [IU] | Freq: Once | INTRAVENOUS | Status: AC | PRN
  Administered 2023-08-09: 500 [IU]

## 2023-08-09 MED ORDER — SODIUM CHLORIDE 0.9% FLUSH
10.0000 mL | INTRAVENOUS | Status: DC | PRN
  Administered 2023-08-09: 10 mL

## 2023-08-09 MED ORDER — SODIUM CHLORIDE 0.9 % IV SOLN: INTRAVENOUS | Status: DC

## 2023-08-09 MED ORDER — SODIUM CHLORIDE 0.9 % IV SOLN
200.0000 mg | Freq: Once | INTRAVENOUS | Status: AC
  Administered 2023-08-09: 200 mg via INTRAVENOUS
  Filled 2023-08-09: qty 200

## 2023-08-09 MED ORDER — DRONABINOL 5 MG PO CAPS
10.0000 mg | ORAL_CAPSULE | Freq: Two times a day (BID) | ORAL | 0 refills | Status: DC
Start: 2023-08-09 — End: 2023-08-29
  Filled 2023-08-09: qty 60, 30d supply, fill #0

## 2023-08-09 NOTE — Patient Instructions (Signed)

## 2023-08-09 NOTE — Progress Notes (Signed)
 [facility] OFFICE PROGRESS NOTE  Patient Care Team: Donita Brooks, MD as PCP - General (Family Medicine)  83 y.o.w. 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   Diagnosis: stage IV clear cell RCC Treatment: Right nephrectomy. 04/26/23 started pembrolizumab. Delayed started of Lenvatinib at 10 mg daily due to wound on 05/07/23   Complete 5 cycles of pembro now as of 3/14. Overall improving and more functioning. Now ambulating by herself without wheelchair anymore! Excellent improvement.    I reviewed her imaging there is significant improvement from her lung metastases and lymphadenopathy.    Report a new lytic bone lesion from MRI of the brain.  There is no intracranial metastases.  Other area of known bone metastases.  Given the oligo- solitary progression, recommend evaluation for SBRT.  Continue with systemic therapy without changes. Discussed above.  I have reached out to GI regarding her colonoscopy follow-up.  She gets call for lenvatinib.    Labs reviewed and adequate to continue treatment.  Assessment & Plan Metastatic renal cell carcinoma to bone (HCC) Continue lenvatinib started on 05/07/23 Continue with pembrolizumab lenvatinib at 10 mg daily for now Vit D 1000 international units with Calcium 1200 mg daily Referral for evaluation for radiation of oliogoprogression of left parietal bone Stage 3b chronic kidney disease (HCC) Stable for the past few weeks Monitor with renal function every cycle At high risk for fracture Bone metastases Patient has no dentaI issues and teeth.  Started Zometa 3 mg every 3 months in Feb Continue zometa. No dental issues Next zometa in May At risk for side effect of medication Drug monitoring for lenvatinib. Monitor for hypertension Baseline EKG normal QTc LFT, renal function, electrolytes, every 2 weeks for 2  months and then monthly thereafter Baseline TSH and free T4 and then monthly urine dipstick; if 2+ then obtain a 24-hour urine protein Monitor for signs of bleeding, wound healing, thrombosis Monitor for irAEs Polyp of colon, unspecified part of colon, unspecified type For procedures, can withhold lenvatinib for 1 week prior to elective surgery and for 2 weeks following procedure. Decreased appetite Increase marinol to 10 mg twice daily.  Communicated with radiation oncology referral and GI regarding colonoscopy, drug interruption schedule.  Orders Placed This Encounter  Procedures   Ambulatory referral to Radiation Oncology    Referral Priority:   Routine    Referral Type:   Consultation    Referral Reason:   Specialty Services Required    Requested Specialty:   Radiation Oncology    Number of Visits Requested:   1     [name], MD  INTERVAL HISTORY: she returns for treatment follow-up.  Overall tolerating treatment well.  Reports some nausea after IV contrast.  Reports some runny nose, sore throat and she was sitting in the yard a lot.  Family question about allergy.  No other new symptoms. No diarrhea.   No bleeding.   Marinol has been helping bilateral with history.  Decreased appetite.  Oncology History  History of breast cancer  06/11/2011 Surgery   Right breast mastectomy with right axillary sentinel lymph node biopsy 1.3 cm invasive lobular cancer, grade 1, atypical lobular hyperplasia, focal LV I, ER 91%, PR 25%, HER-2 negative, 0/8 lymph nodes T1 cN0 stage IA   07/25/2011 -  Anti-estrogen oral therapy   Letrozole 2.5 mg daily 7 years was a  plan   Metastatic renal cell carcinoma to bone (HCC)  02/01/2023 Initial Diagnosis   Renal cell carcinoma (HCC)   02/05/2023 Imaging   Bone scan: No evidence of osseous metastatic disease.    04/03/2023 Pathology Results   A. KIDNEY, RIGHT, RADICAL NEPHRECTOMY:  Clear cell renal cell carcinoma with rhabdoid, sarcomatoid  Features and  tumor necrosis, nuclear grade 4, size 12.0 cm  Tumor extends into perinephric, renal sinus fat and segmental branches  of renal vein (pT3a)  Ureteral, vascular and all margins of resection are negative for tumor    Benign adrenal gland    04/23/2023 - 04/23/2023 Chemotherapy   Patient is on Treatment Plan : RENAL CELL Pembrolizumab (200) + Axitinib q21d     04/25/2023 -  Chemotherapy   Patient is on Treatment Plan : Clear Cell RCC Pembrolizumab (200) q21d     08/02/2023 Imaging   CT CAP  Improving mediastinal lymph nodes as well as multiple bilateral lung nodules. There are several residual lung nodules identified.   Known bone metastases   No developing new mass lesion or nodal enlargement.   Gallbladder full of stones and is distended, similar to previous.   Pelvic prolapse identified of the anterior posterior compartments.   Metallic foci along the lumen of the cecum are linear and have appearance of clips. Please correlate for ingested material or intervention.   08/02/2023 Imaging   MRI Brain 1. Negative for brain metastasis. 2. Newly enhancing 9 mm lesion in the high left parietal bone, possible early bony metastasis   NSCLC of left lung (HCC)  03/18/2023 Initial Diagnosis   NSCLC of left lung (HCC)   06/07/2023 Cancer Staging   Staging form: Lung, AJCC 8th Edition - Clinical: cT1, cN1, cM0 - Signed by [name], MD on 06/07/2023 Stage prefix: Initial diagnosis Histologic grade (G): GX Histologic grading system: 4 grade system      PHYSICAL EXAMINATION: ECOG PERFORMANCE STATUS: 1 - Symptomatic but completely ambulatory  Vitals:   08/09/23 1227 08/09/23 1228  BP: (!) 150/73 (!) 140/77  Pulse: 73   Resp: 16   Temp: 97.9 F (36.6 C)   SpO2: 99%    Filed Weights   08/09/23 1227  Weight: 171 lb 8 oz (77.8 kg)   No acute distress No open mouth ulcer or erythema Effort normal no respiratory distress No edema  Relevant data  reviewed during this visit included lab, imaging, personally reviewed imaging.

## 2023-08-09 NOTE — Assessment & Plan Note (Addendum)
 Drug monitoring for lenvatinib. Monitor for hypertension Baseline EKG normal QTc LFT, renal function, electrolytes, every 2 weeks for 2 months and then monthly thereafter Baseline TSH and free T4 and then monthly urine dipstick; if 2+ then obtain a 24-hour urine protein Monitor for signs of bleeding, wound healing, thrombosis Monitor for irAEs

## 2023-08-09 NOTE — Assessment & Plan Note (Addendum)
 Bone metastases Patient has no dentaI issues and teeth.  Started Zometa 3 mg every 3 months in Feb Continue zometa. No dental issues Next zometa in May

## 2023-08-09 NOTE — Assessment & Plan Note (Addendum)
 Stable for the past few weeks Monitor with renal function every cycle

## 2023-08-09 NOTE — Assessment & Plan Note (Addendum)
 For procedures, can withhold lenvatinib for 1 week prior to elective surgery and for 2 weeks following procedure.

## 2023-08-09 NOTE — Assessment & Plan Note (Addendum)
 Continue lenvatinib started on 05/07/23 Continue with pembrolizumab lenvatinib at 10 mg daily for now Vit D 1000 international units with Calcium 1200 mg daily Referral for evaluation for radiation of oliogoprogression of left parietal bone

## 2023-08-09 NOTE — Assessment & Plan Note (Signed)
 Increase marinol to 10 mg twice daily.

## 2023-08-10 ENCOUNTER — Other Ambulatory Visit: Payer: Self-pay

## 2023-08-12 ENCOUNTER — Other Ambulatory Visit: Payer: Self-pay

## 2023-08-12 ENCOUNTER — Telehealth: Payer: Self-pay | Admitting: Radiation Oncology

## 2023-08-12 DIAGNOSIS — C649 Malignant neoplasm of unspecified kidney, except renal pelvis: Secondary | ICD-10-CM

## 2023-08-12 NOTE — Telephone Encounter (Signed)
Called patient to schedule a consultation w. Dr. Manning. No answer, LVM for a return call.  ?

## 2023-08-12 NOTE — Progress Notes (Signed)
 Repeat TSH and fT4 ordered for 4/24.

## 2023-08-13 NOTE — Progress Notes (Signed)
 Histology and Location of Primary Cancer: Metastatic Renal cell carcinoma  Sites of Visceral and Bony Metastatic Disease: Left parietal bone  Location(s) of Symptomatic Metastases: Left parietal bone  Past/Anticipated chemotherapy by medical oncology, if any:   Dr. Janeann Mean   08/02/2023 Dr. Janeann Mean MR Brain with/without Contrast CLINICAL DATA:  Kidney cancer, staging.   IMPRESSION: 1. Negative for brain metastasis. 2. Newly enhancing 9 mm lesion in the high left parietal bone, possible early bony metastasis  08/02/2023 Dr. Janeann Mean CT Chest Abdomen Pelvis with Contrast CLINICAL DATA:  Renal cell cancer metastatic to bone staging. * Tracking Code: BO *  IMPRESSION: Maturing postsurgical changes identified from right nephrectomy. Improved fluid collections.  Improving mediastinal lymph nodes as well as multiple bilateral lung nodules. There are several residual lung nodules identified.  Lytic bone lesions identified in the thoracic spine, left iliac bone and along the lateral aspect of the right eighth rib. The eighth rib lesion is socially with a pathologic fracture. No developing new mass lesion or nodal enlargement. Gallbladder full of stones and is distended, similar to previous. Pelvic prolapse identified of the anterior posterior compartments. Metallic foci along the lumen of the cecum are linear and have appearance of clips. Please correlate for ingested material or intervention.  05/31/2023 Dr. Janeann Mean NM Bone Scan Whole Body CLINICAL DATA:  new left hip/thigh pain with RCC eval for bone metastases   IMPRESSION: 1. Uptake within the LEFT iliac bone along the sacroiliac joint a suspicious for underlying osseous metastatic disease. Recommend consideration of dedicated MRI for further evaluation. 2. Uptake within the RIGHT lateral eighth rib is suspicious for a pathologic rib fracture but is technically indeterminate. 3. Low level uptake in the mid and  upper thoracic spine is indeterminate but could also reflect additional sites of osseous metastatic disease. Recommend attention on follow-up.   Pain on a scale of 0-10 is:  0/10   If Spine Met(s), symptoms, if any, include: Bowel/Bladder retention or incontinence (please describe): No Numbness or weakness in extremities (please describe): No Current Decadron regimen, if applicable:   Ambulatory status? Walker? Wheelchair?:  No  SAFETY ISSUES: Prior radiation? No Pacemaker/ICD? No Possible current pregnancy? Hysterectomy Is the patient on methotrexate? No  Current Complaints / other details:  Port-a-cath

## 2023-08-17 ENCOUNTER — Other Ambulatory Visit: Payer: Self-pay

## 2023-08-18 DIAGNOSIS — C7931 Secondary malignant neoplasm of brain: Secondary | ICD-10-CM | POA: Insufficient documentation

## 2023-08-18 DIAGNOSIS — C649 Malignant neoplasm of unspecified kidney, except renal pelvis: Secondary | ICD-10-CM | POA: Diagnosis not present

## 2023-08-18 DIAGNOSIS — C7951 Secondary malignant neoplasm of bone: Secondary | ICD-10-CM | POA: Diagnosis not present

## 2023-08-19 NOTE — Progress Notes (Signed)
 Radiation Oncology         [phone] ________________________________  Initial outpatient Consultation - Conducted via telephone due to current COVID-19 concerns for limiting patient exposure  Name: Diane Paul MRN: [id number]  Date of Service: 08/20/2023 DOB: 12-Jan-1941  WU:[id number], Priscille Heidelberg, MD  [name], MD   REFERRING PHYSICIAN: [name], MD  DIAGNOSIS: There were no encounter diagnoses.  No diagnosis found.  HISTORY OF PRESENT ILLNESS: Diane Paul is a 83 y.o. female seen at the request of Dr. Cherly Hensen. She has a remote history of lobular right breast cancer, s/p right mastectomy on 06/11/11 and 7 years of antiestrogen therapy with letrozole. More recently, she was diagnosed with metastatic kidney cancer in 2024. In summary, she initially presented in 01/2023 with anemia. Work up CT scans showed an 11 cm right kidney mass, as well as numerous bilateral pulmonary nodules. Staging brain MRI and bone scan were both negative. Initial bronchoscopy on 02/12/23 showed only atypia, but repeat on 03/11/23 confirmed non-small cell carcinoma in left upper lobe. Attempts to further characterize the pathology were noncontributory. She then proceeded to right nephrectomy on 04/03/23 under Dr. Berneice Heinrich, with pathology showing stage pT3a clear cell renal cell carcinoma with extension into sinus fat and segmental branches of renal vein. She met with Dr. Cherly Hensen, who recommended treatment with lenvatinib plus pembrolizumab (per results from CLEAR trial). She began pembrolizumab on 04/26/23 and lenvatinib on 05/07/23.  She recently underwent restaging scans on 08/02/23. While the CT C/A/P showed improvement in mediastinal lymph nodes and multiple bilateral lung nodules, the brain MRI  showed a new 9 mm bony lesion in the high left parietal bone.  PREVIOUS RADIATION THERAPY: No  PAST MEDICAL HISTORY:  Past Medical History:  Diagnosis Date   Anemia    Anxiety    nervous sometimes, denies  panica ttacks    Arthritis    knees ?   Blood transfusion    post vaginal birth- 1960, 01/2023- anemia   Breast cancer (HCC)    Right Breast Cancer   Cancer (HCC)    R breast cancer   Dry mouth    GERD (gastroesophageal reflux disease)    Hypertension    Neuromuscular disorder (HCC)    L leg, thigh- "burns sometimes"   Shortness of breath    sometimes    Use of letrozole (Femara) 05/07/2010   neoadjuvant femara therapy since 1/212      PAST SURGICAL HISTORY: Past Surgical History:  Procedure Laterality Date   ABDOMINAL HYSTERECTOMY     BIOPSY  02/03/2023   Procedure: BIOPSY;  Surgeon: Jeani Hawking, MD;  Location: [facility] ENDOSCOPY;  Service: Gastroenterology;;  gastric   BREAST LUMPECTOMY     BREAST SURGERY     Right   BRONCHIAL BIOPSY  02/12/2023   Procedure: BRONCHIAL BIOPSIES;  Surgeon: Leslye Peer, MD;  Location: [facility] ENDOSCOPY;  Service: Pulmonary;;   BRONCHIAL BIOPSY  03/11/2023   Procedure: BRONCHIAL BIOPSIES;  Surgeon: Leslye Peer, MD;  Location: [facility] ENDOSCOPY;  Service: Pulmonary;;   BRONCHIAL BRUSHINGS  02/12/2023   Procedure: BRONCHIAL BRUSHINGS;  Surgeon: Leslye Peer, MD;  Location: [facility] ENDOSCOPY;  Service: Pulmonary;;   BRONCHIAL BRUSHINGS  03/11/2023   Procedure: BRONCHIAL BRUSHINGS;  Surgeon: Leslye Peer, MD;  Location: [facility] ENDOSCOPY;  Service: Pulmonary;;   BRONCHIAL NEEDLE ASPIRATION BIOPSY  02/12/2023   Procedure: BRONCHIAL NEEDLE ASPIRATION BIOPSIES;  Surgeon: Leslye Peer, MD;  Location: [facility] ENDOSCOPY;  Service: Pulmonary;;  BRONCHIAL NEEDLE ASPIRATION BIOPSY  03/11/2023   Procedure: BRONCHIAL NEEDLE ASPIRATION BIOPSIES;  Surgeon: Leslye Peer, MD;  Location: [facility] ENDOSCOPY;  Service: Pulmonary;;   BRONCHIAL WASHINGS  02/12/2023   Procedure: BRONCHIAL WASHINGS;  Surgeon: Leslye Peer, MD;  Location: [facility] ENDOSCOPY;  Service: Pulmonary;;   COLONOSCOPY WITH PROPOFOL N/A 02/03/2023   Procedure: COLONOSCOPY WITH PROPOFOL;  Surgeon: Jeani Hawking, MD;   Location: [facility] ENDOSCOPY;  Service: Gastroenterology;  Laterality: N/A;   ESOPHAGOGASTRODUODENOSCOPY (EGD) WITH PROPOFOL N/A 02/03/2023   Procedure: ESOPHAGOGASTRODUODENOSCOPY (EGD) WITH PROPOFOL;  Surgeon: Jeani Hawking, MD;  Location: [facility] ENDOSCOPY;  Service: Gastroenterology;  Laterality: N/A;   HEMOSTASIS CLIP PLACEMENT  02/03/2023   Procedure: HEMOSTASIS CLIP PLACEMENT;  Surgeon: Jeani Hawking, MD;  Location: [facility] ENDOSCOPY;  Service: Gastroenterology;;  ascending colon polyp   IR IMAGING GUIDED PORT INSERTION  04/30/2023   jp drains     s/p masectomy 06/11/11/ Dr. [name], 2 jp drains right chest wall   MASTECTOMY Right    malignant   MASTECTOMY W/ SENTINEL NODE BIOPSY  06/11/2011/Right BReast   Procedure: MASTECTOMY WITH SENTINEL LYMPH NODE BIOPSY;  Surgeon: Ernestene Mention, MD;  Location: [facility] OR;  Service: General;  Laterality: Right;  Right total mastectomy and sentinel lymph node biopsy using lymphatic mapping and blue dye injection.   POLYPECTOMY  02/03/2023   Procedure: POLYPECTOMY;  Surgeon: Jeani Hawking, MD;  Location: [facility] ENDOSCOPY;  Service: Gastroenterology;;  hot snare polypectomy ascending colon   ROBOT ASSISTED LAPAROSCOPIC NEPHRECTOMY Right 04/03/2023   Procedure: XI ROBOTIC ASSISTED RIGHT LAPAROSCOPIC RADICAL NEPHRECTOMY;  Surgeon: Loletta Parish., MD;  Location: [facility] ORS;  Service: Urology;  Laterality: Right;  180 MINUTES   SUBMUCOSAL LIFTING INJECTION  02/03/2023   Procedure: SUBMUCOSAL LIFTING INJECTION;  Surgeon: Jeani Hawking, MD;  Location: [facility] ENDOSCOPY;  Service: Gastroenterology;;  ever lift 16cc ascending colon   TUBAL LIGATION     [place] FINE NEEDLE ASPIRATION WO/ W SMEAR  05/22/10   Left Breast- cyst or abscess     FAMILY HISTORY:  Family History  Problem Relation Age of Onset   Breast cancer Sister    Anesthesia problems Neg Hx    Hypotension Neg Hx    Malignant hyperthermia Neg Hx    Pseudochol deficiency Neg Hx     SOCIAL HISTORY:  Social History    Socioeconomic History   Marital status: Single    Spouse name: Not on file   Number of children: 5   Years of education: Not on file   Highest education level: Not on file  Occupational History   Occupation: Retired  Tobacco Use   Smoking status: Never   Smokeless tobacco: Never  Vaping Use   Vaping status: Never Used  Substance and Sexual Activity   Alcohol use: No   Drug use: No   Sexual activity: Not Currently    Birth control/protection: Post-menopausal  Other Topics Concern   Not on file  Social History Narrative   Not on file   Social Drivers of Health   Financial Resource Strain: Low Risk  (03/25/2023)   Overall Financial Resource Strain (CARDIA)    Difficulty of Paying Living Expenses: Not hard at all  Food Insecurity: No Food Insecurity (04/08/2023)   Hunger Vital Sign    Worried About Running Out of Food in the Last Year: Never true    Ran Out of Food in the Last Year: Never true  Transportation Needs: No Transportation Needs (04/08/2023)   PRAPARE -  Administrator, Civil Service (Medical): No    Lack of Transportation (Non-Medical): No  Physical Activity: Sufficiently Active (07/25/2022)   Exercise Vital Sign    Days of Exercise per Week: 7 days    Minutes of Exercise per Session: 150+ min  Stress: No Stress Concern Present (07/25/2022)   [employer] of Occupational Health - Occupational Stress Questionnaire    Feeling of Stress : Not at all  Social Connections: Socially Isolated (07/25/2022)   Social Connection and Isolation Panel [NHANES]    Frequency of Communication with Friends and Family: More than three times a week    Frequency of Social Gatherings with Friends and Family: More than three times a week    Attends Religious Services: Never    Database administrator or Organizations: No    Attends Banker Meetings: Never    Marital Status: Divorced  Catering manager Violence: Not At Risk (04/08/2023)   Humiliation,  Afraid, Rape, and Kick questionnaire    Fear of Current or Ex-Partner: No    Emotionally Abused: No    Physically Abused: No    Sexually Abused: No    ALLERGIES: Fosamax [alendronate sodium]  MEDICATIONS:  Current Outpatient Medications  Medication Sig Dispense Refill   diphenhydrAMINE (BENADRYL) 25 mg capsule Take 25 mg by mouth every 6 (six) hours as needed for itching.     dronabinol (MARINOL) 5 MG capsule Take 2 capsules (10 mg total) by mouth 2 (two) times daily before a meal. 120 capsule 0   Ensure (ENSURE) Take 237 mLs by mouth 2 (two) times daily between meals.     lenvatinib 10 mg daily dose (LENVIMA) capsule Take 1 capsule (10 mg total) by mouth daily. 30 capsule 0   lidocaine-prilocaine (EMLA) cream Apply 1 Application topically as needed. Apply to port ~ 1 hour prior to access 30 g 0   ondansetron (ZOFRAN) 8 MG tablet Take 1 tablet (8 mg total) by mouth every 8 (eight) hours as needed for nausea or vomiting. 30 tablet 1   prochlorperazine (COMPAZINE) 10 MG tablet Take 1 tablet (10 mg total) by mouth every 6 (six) hours as needed for nausea or vomiting. 30 tablet 1   senna-docusate (SENOKOT-S) 8.6-50 MG tablet Take 1 tablet by mouth 2 (two) times daily. While taking strong pain meds to prevent constipation 10 tablet 0   No current facility-administered medications for this encounter.    REVIEW OF SYSTEMS:  On review of systems, the patient reports that she is doing well overall. She denies any chest pain, shortness of breath, cough, fevers, chills, night sweats, unintended weight changes. She denies any bowel or bladder disturbances, and denies abdominal pain, nausea or vomiting. She denies any new musculoskeletal or joint aches or pains.*** A complete review of systems is obtained and is otherwise negative.    PHYSICAL EXAM:  Wt Readings from Last 3 Encounters:  08/09/23 171 lb 8 oz (77.8 kg)  07/19/23 173 lb 3.2 oz (78.6 kg)  06/28/23 174 lb (78.9 kg)   Temp Readings  from Last 3 Encounters:  08/09/23 97.9 F (36.6 C) (Temporal)  07/19/23 97.9 F (36.6 C) (Temporal)  06/28/23 97.8 F (36.6 C) (Temporal)   BP Readings from Last 3 Encounters:  08/09/23 (!) 140/77  07/19/23 (!) 127/90  07/19/23 109/77   Pulse Readings from Last 3 Encounters:  08/09/23 73  07/19/23 87  07/19/23 95    /10  Physical exam not performed in light of  telephone encounter.   KPS = ***  100 - Normal; no complaints; no evidence of disease. 90   - Able to carry on normal activity; minor signs or symptoms of disease. 80   - Normal activity with effort; some signs or symptoms of disease. 53   - Cares for self; unable to carry on normal activity or to do active work. 60   - Requires occasional assistance, but is able to care for most of his personal needs. 50   - Requires considerable assistance and frequent medical care. 40   - Disabled; requires special care and assistance. 30   - Severely disabled; hospital admission is indicated although death not imminent. 20   - Very sick; hospital admission necessary; active supportive treatment necessary. 10   - Moribund; fatal processes progressing rapidly. 0     - Dead  Karnofsky DA, Abelmann WH, Craver LS and Burchenal JH [phone]) The use of the nitrogen mustards in the palliative treatment of carcinoma: with particular reference to bronchogenic carcinoma Cancer 1 634-56  LABORATORY DATA:  Lab Results  Component Value Date   WBC 4.9 08/09/2023   HGB 12.8 08/09/2023   HCT 40.7 08/09/2023   MCV 87.0 08/09/2023   PLT 243 08/09/2023   Lab Results  Component Value Date   NA 138 08/09/2023   K 3.9 08/09/2023   CL 104 08/09/2023   CO2 28 08/09/2023   Lab Results  Component Value Date   ALT 10 08/09/2023   AST 12 (L) 08/09/2023   ALKPHOS 60 08/09/2023   BILITOT 0.3 08/09/2023     RADIOGRAPHY: MR Brain W Wo Contrast Result Date: 08/08/2023 CLINICAL DATA:  Kidney cancer, staging. EXAM: MRI HEAD WITHOUT AND WITH CONTRAST  TECHNIQUE: Multiplanar, multiecho pulse sequences of the brain and surrounding structures were obtained without and with intravenous contrast. CONTRAST:  7mL GADAVIST GADOBUTROL 1 MMOL/ML IV SOLN COMPARISON:  Brain MRI 04/25/2023 FINDINGS: Brain: No enhancement or swelling to suggest metastatic disease. No acute or subacute infarct, hemorrhage, hydrocephalus, or collection. Generalized cerebral volume loss which is mild for age. Mild chronic microvascular ischemia is presumed in the cerebral white matter. Vascular: Major flow voids and vascular enhancements are preserved Skull and upper cervical spine: 9 mm enhancing nodule in the superior left parietal bone on 16:152, area nonenhancing on prior and completely in apparent on 2024 study. Concerning given the rib lesion described on recent chest CT. This is along the inner table without subjacent dural thickening. Sinuses/Orbits: Negative IMPRESSION: 1. Negative for brain metastasis. 2. Newly enhancing 9 mm lesion in the high left parietal bone, possible early bony metastasis Electronically Signed   By: Ronnette Coke M.D.   On: 08/08/2023 08:03   CT CHEST ABDOMEN PELVIS W CONTRAST Result Date: 08/07/2023 CLINICAL DATA:  Renal cell cancer metastatic to bone staging. * Tracking Code: BO * EXAM: CT CHEST, ABDOMEN, AND PELVIS WITH CONTRAST TECHNIQUE: Multidetector CT imaging of the chest, abdomen and pelvis was performed following the standard protocol during bolus administration of intravenous contrast. RADIATION DOSE REDUCTION: This exam was performed according to the departmental dose-optimization program which includes automated exposure control, adjustment of the mA and/or kV according to patient size and/or use of iterative reconstruction technique. CONTRAST:  80mL OMNIPAQUE IOHEXOL 300 MG/ML  SOLN COMPARISON:  Bone scan 05/31/2023. CT 04/18/2023. Older exams as well. FINDINGS: CT CHEST FINDINGS Cardiovascular: Left upper chest port in place. Port is accessed.  Tip seen to the SVC right atrial junction. The heart is  nonenlarged. Coronary artery calcifications are seen. The thoracic aorta has a normal course and caliber vascular calcifications. Mediastinum/Nodes: Normal caliber thoracic esophagus. Mildly complex right-sided thyroid nodule seen with cystic and calcified components measuring 12 mm. No specific abnormal lymph node enlargement identified in the axillary region, hilum. There are some small mediastinal nodes identified which are less than a cm in short axis and not pathologic by size criteria. Previously there was an AP window node which measured 10 mm on the prior examination in short axis and today 5 mm on series 2, image 19. subcarinal node which measured 12 mm in short axis on the previous examination, today measures 7 mm on series 2, image 25. Lungs/Pleura: No consolidation, pneumothorax or effusion. Previous lung nodules are improving diffusely. Specific lesions will be followed for continuity. Medial right apical mass which previously measured 2.7 x 2.8 cm, today measures 11 by 6 mm on series 6, image 31. More posterolateral right upper lobe lesion which previously measured 2.1 x 1.7 cm, today on series 6 image 26 measures 18 by 13 mm. Left lower lobe lesion which measured 2.6 x 1.3 cm abutting the pleura, today measures 15 by 7 mm on series 6, image 79. Again other lesions are significantly improved. Musculoskeletal: Degenerative changes along the spine. There is a mixed lucent sclerotic lesion involving the vertebral body at T3, new from the prior CT scan. This was seen on the bone scan of 05/31/2023. There is a healing fracture lateral aspect of the right eighth rib with a lucent lesion component. This could be a pathologic yelling fracture. Is also was seen on bone scan. CT ABDOMEN PELVIS FINDINGS Hepatobiliary: No space-occupying liver lesion. Patent portal vein. Gallbladder is distended with numerous stones. This was seen previously. Pancreas: Global  mild atrophy of the pancreas.  No obvious mass. Spleen: Spleen is not enlarged.  Small splenule. Adrenals/Urinary Tract: Adrenal glands are preserved. Minimal thickening of the right adrenal gland superiorly, nonspecific. No enhancing left-sided renal mass. Benign-appearing cysts are seen and unchanged. Mild atrophy. No left-sided renal collecting system dilatation. The left ureter has a normal course and caliber down to the bladder which is decompressed and low lying, cystocele. Surgical changes from right nephrectomy. Previous fluid collection along the surgical bed has improved. No abnormal soft tissue in the surgical bed. Stomach/Bowel: On this non oral contrast exam large bowel is normal course and caliber. Scattered stool. Left-sided colonic diverticula. There is a linear high density structure within the lumen of cecum. Please correlate for any ingested material or other process. On sagittal imaging these look like clips. Low lying rectum identified. Please correlate for posterior compartment prolapse. Stomach is underdistended. Small bowel is nondilated. Vascular/Lymphatic: Aortic atherosclerosis. No enlarged abdominal or pelvic lymph nodes. Reproductive: Status post hysterectomy. No adnexal masses. Other: No free air or free fluid. Soft tissue thickening with trace fluid along the anterior abdomen along right side, likely postsurgical. Previously there is a larger hematoma or seroma in this location. Musculoskeletal: Degenerative changes seen spine pelvis. There is a lucent lesion involving the posterior left iliac bone with modeling. This was seen previously. This was also seen on the bone scan. IMPRESSION: Maturing postsurgical changes identified from right nephrectomy. Improved fluid collections. Improving mediastinal lymph nodes as well as multiple bilateral lung nodules. There are several residual lung nodules identified. Lytic bone lesions identified in the thoracic spine, left iliac bone and along  the lateral aspect of the right eighth rib. The eighth rib lesion is socially with  a pathologic fracture. No developing new mass lesion or nodal enlargement. Gallbladder full of stones and is distended, similar to previous. Pelvic prolapse identified of the anterior posterior compartments. Metallic foci along the lumen of the cecum are linear and have appearance of clips. Please correlate for ingested material or intervention. Electronically Signed   By: Adrianna Horde M.D.   On: 08/07/2023 12:47      IMPRESSION/PLAN: This visit was conducted via telephone to spare the patient unnecessary potential exposure in the healthcare setting during the current COVID-19 pandemic. 1. 83 y.o. woman with new cranial lesion from metastatic renal cell carcinoma***  Today, we talked to the patient and family about the findings and workup thus far. We discussed the natural history of metastatic kidney cancer and general treatment, highlighting the role of radiotherapy in the management of osseous metastasis. We discussed the available radiation techniques, and focused on the details and logistics of delivery. We reviewed the anticipated acute and late sequelae associated with radiation in this setting. The patient was encouraged to ask questions that were answered to her satisfaction.  At the end of our discussion, the patient *** She is scheduled for CT simulation later this afternoon.***    Given current concerns for patient exposure during the COVID-19 pandemic, this encounter was conducted via telephone. The patient was notified in advance and was offered a WebEX meeting to allow for face to face communication but unfortunately reported that he did not have the appropriate resources/technology to support such a visit and instead preferred to proceed with telephone consult. The patient has given verbal consent for this type of encounter. The time spent during this encounter was *** minutes. The attendants for this  meeting include Kenith Payer MD, Annaleah Arata PA-C, patient Diane Paul {and ***.} During the encounter, Kenith Payer MD and Keitha Pata PA-C were located at [facility] Radiation Oncology Department.  Patient Diane Paul {and *** were} was located at home.     Arta Bihari, PA-C    Kenith Payer, MD  [facility]  Radiation Oncology Direct Dial: [phone]  Fax: [phone] [email or web address]  Skype  [email or web address]   This document serves as a record of services personally performed by Kenith Payer, MD and Keitha Pata, PA-C. It was created on their behalf by Florance Hun, a trained medical scribe. The creation of this record is based on the scribe's personal observations and the provider's statements to them. This document has been checked and approved by the attending provider.

## 2023-08-20 ENCOUNTER — Other Ambulatory Visit: Payer: Self-pay

## 2023-08-20 ENCOUNTER — Ambulatory Visit
Admission: RE | Admit: 2023-08-20 | Discharge: 2023-08-20 | Disposition: A | Discharge status: Home / Self Care | Source: Ambulatory Visit | Attending: Radiation Oncology | Admitting: Radiation Oncology

## 2023-08-20 ENCOUNTER — Ambulatory Visit: Admitting: Radiation Oncology

## 2023-08-20 ENCOUNTER — Encounter: Payer: Self-pay | Admitting: Radiation Oncology

## 2023-08-20 DIAGNOSIS — C649 Malignant neoplasm of unspecified kidney, except renal pelvis: Secondary | ICD-10-CM

## 2023-08-20 DIAGNOSIS — C7931 Secondary malignant neoplasm of brain: Secondary | ICD-10-CM

## 2023-08-20 DIAGNOSIS — C7951 Secondary malignant neoplasm of bone: Secondary | ICD-10-CM | POA: Diagnosis not present

## 2023-08-22 ENCOUNTER — Other Ambulatory Visit: Payer: Self-pay

## 2023-08-22 DIAGNOSIS — C641 Malignant neoplasm of right kidney, except renal pelvis: Secondary | ICD-10-CM

## 2023-08-22 NOTE — Progress Notes (Signed)
 Specialty Pharmacy Refill Coordination Note  Diane Paul is a 83 y.o. female, patients daughter was contacted today regarding refills of specialty medication(s) Lenvatinib Mesylate (LENVIMA)   Patient requested Delivery   Delivery date: 08/27/23   Verified address: [address]   [name] [id number]   Medication will be filled on 08/26/23.   This fill date is pending response to refill request from provider. Patients daughter is aware and if they have not received fill by intended date they must follow up with pharmacy.

## 2023-08-23 ENCOUNTER — Other Ambulatory Visit: Payer: Self-pay

## 2023-08-23 ENCOUNTER — Ambulatory Visit
Admission: RE | Admit: 2023-08-23 | Discharge: 2023-08-23 | Disposition: A | Discharge status: Home / Self Care | Source: Ambulatory Visit | Attending: Radiation Oncology | Admitting: Radiation Oncology

## 2023-08-23 DIAGNOSIS — C7951 Secondary malignant neoplasm of bone: Secondary | ICD-10-CM | POA: Diagnosis not present

## 2023-08-23 DIAGNOSIS — C649 Malignant neoplasm of unspecified kidney, except renal pelvis: Secondary | ICD-10-CM | POA: Insufficient documentation

## 2023-08-23 DIAGNOSIS — C7931 Secondary malignant neoplasm of brain: Secondary | ICD-10-CM | POA: Insufficient documentation

## 2023-08-26 ENCOUNTER — Other Ambulatory Visit: Payer: Self-pay

## 2023-08-26 MED ORDER — LENVATINIB (10 MG DAILY DOSE) 10 MG PO CPPK
10.0000 mg | ORAL_CAPSULE | Freq: Every day | ORAL | 0 refills | Status: DC
  Filled 2023-08-26: qty 30, 30d supply, fill #0

## 2023-08-27 NOTE — Progress Notes (Unsigned)
 [facility] OFFICE PROGRESS NOTE  Patient Care Team: Austine Lefort, MD as PCP - General (Family Medicine)  83 y.o.w. 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   Diagnosis: stage IV clear cell RCC Treatment: Right nephrectomy. 04/26/23 started pembrolizumab . Delayed started of Lenvatinib  at 10 mg daily due to wound on 05/07/23   Complete 5 cycles of pembro now as of 3/14. Overall improving and more functioning. Now ambulating by herself without wheelchair anymore! Excellent improvement.  Assessment & Plan Metastatic renal cell carcinoma to bone (HCC) Continue lenvatinib  started on 05/07/23 Continue with pembrolizumab  lenvatinib  at 10 mg daily for now Vit D 1000 international units with Calcium  1200 mg daily Referral for evaluation for radiation of oliogoprogression of left parietal bone At risk for side effect of medication Drug monitoring for lenvatinib . Monitor for hypertension Baseline EKG normal QTc LFT, renal function, electrolytes, every 2 weeks for 2 months and then monthly thereafter Baseline TSH and free T4 and then monthly urine dipstick; if 2+ then obtain a 24-hour urine protein Monitor for signs of bleeding, wound healing, thrombosis Monitor for irAEs Stage 3a chronic kidney disease (HCC) Stable for the past few weeks Monitor with renal function every cycle Malignant neoplasm metastatic to both lungs (HCC) Continue treatment for kidney cancer with len/pembro Fatigue, unspecified type 83 days post pembro. And resolved on its own. Increase fluid. Rest during the first few days. Decreased appetite decrease marinol  to 2.5 mg twice daily. Refill today  No orders of the defined types were placed in this encounter.    Lowanda Ruddy, MD  INTERVAL HISTORY: she returns for treatment follow-up.  Report of some ear ache on the right down the sore  throat. No fever, chills. No cough. She feels postnasal drainage.   She is eating better. Report marinol  making her uneasy, abnormal sensation.  Complications related to previous cycle of chemotherapy included none.  Oncology History  History of breast cancer  06/11/2011 Surgery   Right breast mastectomy with right axillary sentinel lymph node biopsy 1.3 cm invasive lobular cancer, grade 1, atypical lobular hyperplasia, focal LV I, ER 91%, PR 25%, HER-2 negative, 0/8 lymph nodes T1 cN0 stage IA   07/25/2011 -  Anti-estrogen oral therapy   Letrozole  2.5 mg daily 7 years was a plan   Metastatic renal cell carcinoma to bone (HCC)  02/01/2023 Initial Diagnosis   Renal cell carcinoma (HCC)   02/05/2023 Imaging   Bone scan: No evidence of osseous metastatic disease.    04/03/2023 Pathology Results   A. 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 There are several residual lung nodules identified.   Known bone metastases   No developing new mass lesion  or nodal enlargement.   Gallbladder full of stones and is distended, similar to previous.   Pelvic prolapse identified of the anterior posterior compartments.   Metallic foci along the lumen of the cecum are linear and have appearance of clips. Please correlate for ingested material or intervention.    08/02/2023 Imaging   MRI Brain 1. Negative for brain metastasis. 2. Newly enhancing 9 mm lesion in the high left parietal bone, possible early bony metastasis   NSCLC of left lung (HCC)  03/18/2023 Initial Diagnosis   NSCLC of left lung (HCC)   06/07/2023 Cancer Staging   Staging form: Lung, AJCC 8th Edition - Clinical: cT1, cN1, cM0 - Signed by Lowanda Ruddy, MD on 06/07/2023 Stage prefix: Initial diagnosis Histologic grade (G): GX Histologic grading system: 4 grade system      PHYSICAL EXAMINATION: ECOG PERFORMANCE STATUS: 1 - Symptomatic but completely ambulatory  Vitals:   08/29/23 1007  BP: (!) 146/84  Pulse: 86  Resp: 18  Temp: 98.5 F (36.9 C)  SpO2: 97%   Filed Weights   08/29/23 1007  Weight: 165 lb 4.8 oz (75 kg)   No distress. Normal breathing effort. Abdomen No distention. Soft and non-tender  Relevant data reviewed during this visit included labs

## 2023-08-27 NOTE — Assessment & Plan Note (Addendum)
 Stable for the past few weeks Monitor with renal function every cycle

## 2023-08-27 NOTE — Progress Notes (Signed)
  Radiation Oncology         [phone] ________________________________  Name: Diane Paul MRN: [id number]  Date: 08/23/2023  DOB: January 30, 1941  SIMULATION AND TREATMENT PLANNING NOTE    ICD-10-CM   1. Metastasis to brain [facility])  C79.31     2. Metastatic renal cell carcinoma to bone (HCC)  C79.51    C64.9       DIAGNOSIS:  83 yo woman with painful 9 mm left parietal metastasis  NARRATIVE:  The patient was brought to the CT Simulation planning suite.  Identity was confirmed.  All relevant records and images related to the planned course of therapy were reviewed.  The patient freely provided informed written consent to proceed with treatment after reviewing the details related to the planned course of therapy. The consent form was witnessed and verified by the simulation staff. Intravenous access was established for contrast administration. Then, the patient was set-up in a stable reproducible supine position for radiation therapy.  A relocatable thermoplastic stereotactic head frame was fabricated for precise immobilization.  CT images were obtained.  Surface markings were placed.  The CT images were loaded into the planning software and fused with the patient's targeting MRI scan.  Then the target and avoidance structures were contoured.  Treatment planning then occurred.  The radiation prescription was entered and confirmed.  I have requested 3D planning  I have requested a DVH of the following structures: Brain stem, brain, left eye, right eye, lenses, optic chiasm, target volumes, uninvolved brain, and normal tissue.    SPECIAL TREATMENT PROCEDURE:  The planned course of therapy using radiation constitutes a special treatment procedure. Special care is required in the management of this patient for the following reasons. This treatment constitutes a Special Treatment Procedure for the following reason: High dose per fraction requiring special monitoring for increased toxicities of treatment  including daily imaging.  The special nature of the planned course of radiotherapy will require increased physician supervision and oversight to ensure patient's safety with optimal treatment outcomes.  This requires extended time and effort.  PLAN:  The patient will receive 25 Gy in 5 fractions.  ________________________________  Trilby Fujisawa Lorri Rota, M.D.

## 2023-08-27 NOTE — Assessment & Plan Note (Addendum)
 Drug monitoring for lenvatinib. Monitor for hypertension Baseline EKG normal QTc LFT, renal function, electrolytes, every 2 weeks for 2 months and then monthly thereafter Baseline TSH and free T4 and then monthly urine dipstick; if 2+ then obtain a 24-hour urine protein Monitor for signs of bleeding, wound healing, thrombosis Monitor for irAEs

## 2023-08-27 NOTE — Assessment & Plan Note (Addendum)
 Continue lenvatinib started on 05/07/23 Continue with pembrolizumab lenvatinib at 10 mg daily for now Vit D 1000 international units with Calcium 1200 mg daily Referral for evaluation for radiation of oliogoprogression of left parietal bone

## 2023-08-28 DIAGNOSIS — C7931 Secondary malignant neoplasm of brain: Secondary | ICD-10-CM | POA: Diagnosis not present

## 2023-08-28 DIAGNOSIS — C649 Malignant neoplasm of unspecified kidney, except renal pelvis: Secondary | ICD-10-CM | POA: Diagnosis not present

## 2023-08-28 DIAGNOSIS — C7951 Secondary malignant neoplasm of bone: Secondary | ICD-10-CM | POA: Diagnosis not present

## 2023-08-29 ENCOUNTER — Inpatient Hospital Stay

## 2023-08-29 ENCOUNTER — Inpatient Hospital Stay (HOSPITAL_BASED_OUTPATIENT_CLINIC_OR_DEPARTMENT_OTHER)

## 2023-08-29 VITALS — BP 146/84 | HR 86 | Temp 98.5°F | Resp 18 | Ht 67.0 in | Wt 165.3 lb

## 2023-08-29 DIAGNOSIS — I10 Essential (primary) hypertension: Secondary | ICD-10-CM | POA: Diagnosis not present

## 2023-08-29 DIAGNOSIS — Z9189 Other specified personal risk factors, not elsewhere classified: Secondary | ICD-10-CM | POA: Diagnosis not present

## 2023-08-29 DIAGNOSIS — C649 Malignant neoplasm of unspecified kidney, except renal pelvis: Secondary | ICD-10-CM

## 2023-08-29 DIAGNOSIS — Z853 Personal history of malignant neoplasm of breast: Secondary | ICD-10-CM | POA: Diagnosis not present

## 2023-08-29 DIAGNOSIS — C7951 Secondary malignant neoplasm of bone: Secondary | ICD-10-CM

## 2023-08-29 DIAGNOSIS — M8580 Other specified disorders of bone density and structure, unspecified site: Secondary | ICD-10-CM

## 2023-08-29 DIAGNOSIS — N1831 Chronic kidney disease, stage 3a: Secondary | ICD-10-CM

## 2023-08-29 DIAGNOSIS — R630 Anorexia: Secondary | ICD-10-CM

## 2023-08-29 DIAGNOSIS — N1832 Chronic kidney disease, stage 3b: Secondary | ICD-10-CM | POA: Diagnosis not present

## 2023-08-29 DIAGNOSIS — C3412 Malignant neoplasm of upper lobe, left bronchus or lung: Secondary | ICD-10-CM | POA: Diagnosis not present

## 2023-08-29 DIAGNOSIS — R5383 Other fatigue: Secondary | ICD-10-CM | POA: Diagnosis not present

## 2023-08-29 DIAGNOSIS — Z905 Acquired absence of kidney: Secondary | ICD-10-CM | POA: Diagnosis not present

## 2023-08-29 DIAGNOSIS — C7801 Secondary malignant neoplasm of right lung: Secondary | ICD-10-CM

## 2023-08-29 DIAGNOSIS — Z8673 Personal history of transient ischemic attack (TIA), and cerebral infarction without residual deficits: Secondary | ICD-10-CM | POA: Diagnosis not present

## 2023-08-29 DIAGNOSIS — C7802 Secondary malignant neoplasm of left lung: Secondary | ICD-10-CM | POA: Diagnosis not present

## 2023-08-29 DIAGNOSIS — Z5112 Encounter for antineoplastic immunotherapy: Secondary | ICD-10-CM | POA: Diagnosis not present

## 2023-08-29 DIAGNOSIS — C641 Malignant neoplasm of right kidney, except renal pelvis: Secondary | ICD-10-CM | POA: Diagnosis not present

## 2023-08-29 LAB — CMP (CANCER CENTER ONLY)
ALT: 13 U/L (ref 0–44)
AST: 15 U/L (ref 15–41)
Albumin: 3.7 g/dL (ref 3.5–5.0)
Alkaline Phosphatase: 57 U/L (ref 38–126)
Anion gap: 7 (ref 5–15)
BUN: 18 mg/dL (ref 8–23)
CO2: 29 mmol/L (ref 22–32)
Calcium: 8.9 mg/dL (ref 8.9–10.3)
Chloride: 102 mmol/L (ref 98–111)
Creatinine: 1.01 mg/dL — ABNORMAL HIGH (ref 0.44–1.00)
GFR, Estimated: 56 mL/min — ABNORMAL LOW (ref >60)
Glucose, Bld: 100 mg/dL — ABNORMAL HIGH (ref 70–99)
Potassium: 4.2 mmol/L (ref 3.5–5.1)
Sodium: 138 mmol/L (ref 135–145)
Total Bilirubin: 0.4 mg/dL (ref 0.0–1.2)
Total Protein: 7.4 g/dL (ref 6.5–8.1)

## 2023-08-29 LAB — CBC WITH DIFFERENTIAL (CANCER CENTER ONLY)
Abs Immature Granulocytes: 0.02 10*3/uL (ref 0.00–0.07)
Basophils Absolute: 0 10*3/uL (ref 0.0–0.1)
Basophils Relative: 0 %
Eosinophils Absolute: 0.1 10*3/uL (ref 0.0–0.5)
Eosinophils Relative: 2 %
HCT: 40.2 % (ref 36.0–46.0)
Hemoglobin: 12.9 g/dL (ref 12.0–15.0)
Immature Granulocytes: 0 %
Lymphocytes Relative: 25 %
Lymphs Abs: 1.4 10*3/uL (ref 0.7–4.0)
MCH: 27.1 pg (ref 26.0–34.0)
MCHC: 32.1 g/dL (ref 30.0–36.0)
MCV: 84.5 fL (ref 80.0–100.0)
Monocytes Absolute: 0.9 10*3/uL (ref 0.1–1.0)
Monocytes Relative: 15 %
Neutro Abs: 3.2 10*3/uL (ref 1.7–7.7)
Neutrophils Relative %: 58 %
Platelet Count: 253 10*3/uL (ref 150–400)
RBC: 4.76 MIL/uL (ref 3.87–5.11)
RDW: 12.3 % (ref 11.5–15.5)
WBC Count: 5.7 10*3/uL (ref 4.0–10.5)
nRBC: 0 % (ref 0.0–0.2)

## 2023-08-29 LAB — T4, FREE: Free T4: 2.95 ng/dL — ABNORMAL HIGH (ref 0.61–1.12)

## 2023-08-29 LAB — LACTATE DEHYDROGENASE: LDH: 163 U/L (ref 98–192)

## 2023-08-29 LAB — TOTAL PROTEIN, URINE DIPSTICK: Protein, ur: NEGATIVE mg/dL

## 2023-08-29 LAB — TSH: TSH: 0.021 u[IU]/mL — ABNORMAL LOW (ref 0.350–4.500)

## 2023-08-29 MED ORDER — SODIUM CHLORIDE 0.9 % IV SOLN
200.0000 mg | Freq: Once | INTRAVENOUS | Status: AC
  Administered 2023-08-29: 200 mg via INTRAVENOUS
  Filled 2023-08-29: qty 200

## 2023-08-29 MED ORDER — SODIUM CHLORIDE 0.9% FLUSH
10.0000 mL | INTRAVENOUS | Status: DC | PRN
Start: 2023-08-29 — End: 2023-08-29
  Administered 2023-08-29: 10 mL

## 2023-08-29 MED ORDER — HEPARIN SOD (PORK) LOCK FLUSH 100 UNIT/ML IV SOLN
500.0000 [IU] | Freq: Once | INTRAVENOUS | Status: AC | PRN
Start: 2023-08-29 — End: 2023-08-29
  Administered 2023-08-29: 500 [IU]

## 2023-08-29 MED ORDER — SODIUM CHLORIDE 0.9% FLUSH
10.0000 mL | Freq: Once | INTRAVENOUS | Status: AC
  Administered 2023-08-29: 10 mL

## 2023-08-29 MED ORDER — SODIUM CHLORIDE 0.9 % IV SOLN: INTRAVENOUS | Status: DC

## 2023-08-29 NOTE — Patient Instructions (Signed)

## 2023-08-29 NOTE — Assessment & Plan Note (Signed)
 Continue treatment for kidney cancer with len/pembro

## 2023-08-29 NOTE — Assessment & Plan Note (Signed)
 3 days post pembro. And resolved on its own. Increase fluid. Rest during the first few days.

## 2023-08-29 NOTE — Progress Notes (Unsigned)
 Pt c/o intermittent R earache x several days. Reports that she tried Claritin as suggested by Dr. Alita Irwin at her last OV, but this made her feel bad, so she stopped taking it. She also says that increased dose of Marinol  has made her feel bad as well. Dr. Alita Irwin notified of the above.

## 2023-08-30 ENCOUNTER — Other Ambulatory Visit (HOSPITAL_BASED_OUTPATIENT_CLINIC_OR_DEPARTMENT_OTHER): Payer: Self-pay

## 2023-08-30 ENCOUNTER — Ambulatory Visit: Payer: Medicare HMO

## 2023-08-30 ENCOUNTER — Other Ambulatory Visit: Payer: Medicare HMO

## 2023-08-30 MED ORDER — DRONABINOL 2.5 MG PO CAPS
5.0000 mg | ORAL_CAPSULE | Freq: Two times a day (BID) | ORAL | 0 refills | Status: DC
  Filled 2023-08-30 – 2023-09-09 (×2): qty 60, 15d supply, fill #0

## 2023-08-30 NOTE — Assessment & Plan Note (Addendum)
 decrease marinol  to 2.5 mg twice daily. Refill today

## 2023-09-02 ENCOUNTER — Other Ambulatory Visit (HOSPITAL_COMMUNITY): Payer: Self-pay

## 2023-09-03 ENCOUNTER — Ambulatory Visit
Admission: RE | Admit: 2023-09-03 | Discharge: 2023-09-03 | Disposition: A | Discharge status: Home / Self Care | Source: Ambulatory Visit | Attending: Radiation Oncology | Admitting: Radiation Oncology

## 2023-09-03 ENCOUNTER — Other Ambulatory Visit: Payer: Self-pay

## 2023-09-03 DIAGNOSIS — C7931 Secondary malignant neoplasm of brain: Secondary | ICD-10-CM | POA: Diagnosis not present

## 2023-09-03 DIAGNOSIS — C7951 Secondary malignant neoplasm of bone: Secondary | ICD-10-CM | POA: Diagnosis not present

## 2023-09-03 DIAGNOSIS — C649 Malignant neoplasm of unspecified kidney, except renal pelvis: Secondary | ICD-10-CM | POA: Diagnosis not present

## 2023-09-03 LAB — RAD ONC ARIA SESSION SUMMARY
Course Elapsed Days: 0
Plan Fractions Treated to Date: 1
Plan Prescribed Dose Per Fraction: 5 Gy
Plan Total Fractions Prescribed: 5
Plan Total Prescribed Dose: 25 Gy
Reference Point Dosage Given to Date: 5 Gy
Reference Point Session Dosage Given: 5 Gy
Session Number: 1

## 2023-09-04 ENCOUNTER — Ambulatory Visit: Payer: Self-pay

## 2023-09-04 ENCOUNTER — Ambulatory Visit

## 2023-09-04 ENCOUNTER — Telehealth: Payer: Self-pay

## 2023-09-04 NOTE — Telephone Encounter (Signed)
 Copied from CRM [id number]. Topic: Clinical - Red Word Triage >> Sep 04, 2023  1:18 PM Oddis Bench wrote: Red Word that prompted transfer to Nurse Triage: Patient is calling about swollen sore neck, ear ache and sore throat.   Chief Complaint: Sore throat  Symptoms: Sore throat, neck pain Frequency: Constant  Pertinent Negatives: Patient denies fever Disposition: [] ED /[] Urgent Care (no appt availability in office) / [x] Appointment(In office/virtual)/ []  [facility]/ [] Home Care/ [] Refused Recommended Disposition /[] [facility]/ []  Follow-up with PCP Additional Notes: Patient reports she has had a sore throat for the last week. She states her pain is moderate and states it causes some difficulty swallowing due to the pain. She states she is also experiencing some pain in her neck "like a crook in my neck." She denies any fevers. Appointment made for the patient on Friday for evaluation.    Reason for Disposition  [1] Sore throat is the only symptom AND [2] present > 48 hours  Answer Assessment - Initial Assessment Questions 1. ONSET: "When did the throat start hurting?" (Hours or days ago)      1 weeks  2. SEVERITY: "How bad is the sore throat?" (Scale 1-10; mild, moderate or severe)   - MILD (1-3):  Doesn't interfere with eating or normal activities.   - MODERATE (4-7): Interferes with eating some solids and normal activities.   - SEVERE (8-10):  Excruciating pain, interferes with most normal activities.   - SEVERE WITH DYSPHAGIA (10): Can't swallow liquids, drooling.     Moderate  3. STREP EXPOSURE: "Has there been any exposure to strep within the past week?" If Yes, ask: "What type of contact occurred?"      No 4.  VIRAL SYMPTOMS: "Are there any symptoms of a cold, such as a runny nose, cough, hoarse voice or red eyes?"      No 5. FEVER: "Do you have a fever?" If Yes, ask: "What is your temperature, how was it measured, and when did it start?"     No 6. PUS ON THE  TONSILS: "Is there pus on the tonsils in the back of your throat?"     No 7. OTHER SYMPTOMS: "Do you have any other symptoms?" (e.g., difficulty breathing, headache, rash)     Neck pain  Protocols used: Sore Throat-A-AH

## 2023-09-04 NOTE — Telephone Encounter (Signed)
 TC from pt's daughter, reporting that pt's ear is continuing to bother her, despite taking antihistamine which Dr. Alita Irwin recommended. She states she believes pt needs an antibiotic. She also reports that Dr. Alita Irwin looked in pt's ear last week and said it looked normal. Informed her that Dr. Alita Irwin would be notified of the situation, but advised her to contact pt's PCP to have this problem further evaluated.

## 2023-09-05 ENCOUNTER — Inpatient Hospital Stay

## 2023-09-05 ENCOUNTER — Other Ambulatory Visit: Payer: Self-pay

## 2023-09-05 ENCOUNTER — Ambulatory Visit
Admission: RE | Admit: 2023-09-05 | Discharge: 2023-09-05 | Disposition: A | Discharge status: Home / Self Care | Source: Ambulatory Visit | Attending: Radiation Oncology | Admitting: Radiation Oncology

## 2023-09-05 VITALS — BP 144/90 | HR 95 | Temp 97.9°F | Resp 17 | Wt 164.4 lb

## 2023-09-05 DIAGNOSIS — Z905 Acquired absence of kidney: Secondary | ICD-10-CM | POA: Diagnosis not present

## 2023-09-05 DIAGNOSIS — J3489 Other specified disorders of nose and nasal sinuses: Secondary | ICD-10-CM | POA: Diagnosis not present

## 2023-09-05 DIAGNOSIS — Z923 Personal history of irradiation: Secondary | ICD-10-CM | POA: Diagnosis not present

## 2023-09-05 DIAGNOSIS — N1832 Chronic kidney disease, stage 3b: Secondary | ICD-10-CM | POA: Diagnosis not present

## 2023-09-05 DIAGNOSIS — Z5112 Encounter for antineoplastic immunotherapy: Secondary | ICD-10-CM | POA: Insufficient documentation

## 2023-09-05 DIAGNOSIS — C3412 Malignant neoplasm of upper lobe, left bronchus or lung: Secondary | ICD-10-CM | POA: Insufficient documentation

## 2023-09-05 DIAGNOSIS — C7801 Secondary malignant neoplasm of right lung: Secondary | ICD-10-CM | POA: Insufficient documentation

## 2023-09-05 DIAGNOSIS — C7951 Secondary malignant neoplasm of bone: Secondary | ICD-10-CM | POA: Insufficient documentation

## 2023-09-05 DIAGNOSIS — C641 Malignant neoplasm of right kidney, except renal pelvis: Secondary | ICD-10-CM | POA: Insufficient documentation

## 2023-09-05 DIAGNOSIS — C7802 Secondary malignant neoplasm of left lung: Secondary | ICD-10-CM | POA: Insufficient documentation

## 2023-09-05 DIAGNOSIS — Z8673 Personal history of transient ischemic attack (TIA), and cerebral infarction without residual deficits: Secondary | ICD-10-CM | POA: Diagnosis not present

## 2023-09-05 DIAGNOSIS — Z853 Personal history of malignant neoplasm of breast: Secondary | ICD-10-CM | POA: Diagnosis not present

## 2023-09-05 DIAGNOSIS — H9201 Otalgia, right ear: Secondary | ICD-10-CM | POA: Insufficient documentation

## 2023-09-05 DIAGNOSIS — R630 Anorexia: Secondary | ICD-10-CM

## 2023-09-05 DIAGNOSIS — C7931 Secondary malignant neoplasm of brain: Secondary | ICD-10-CM | POA: Diagnosis not present

## 2023-09-05 DIAGNOSIS — C649 Malignant neoplasm of unspecified kidney, except renal pelvis: Secondary | ICD-10-CM

## 2023-09-05 LAB — RAD ONC ARIA SESSION SUMMARY
Course Elapsed Days: 2
Plan Fractions Treated to Date: 2
Plan Prescribed Dose Per Fraction: 5 Gy
Plan Total Fractions Prescribed: 5
Plan Total Prescribed Dose: 25 Gy
Reference Point Dosage Given to Date: 10 Gy
Reference Point Session Dosage Given: 5 Gy
Session Number: 2

## 2023-09-05 NOTE — Progress Notes (Signed)
 [facility] OFFICE PROGRESS NOTE  Patient Care Team: Austine Lefort, MD as PCP - General (Family Medicine)  83 y.o.w. 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   Diagnosis: stage IV clear cell RCC Treatment: Right nephrectomy. 04/26/23 started pembrolizumab . Delayed started of Lenvatinib  at 10 mg daily due to wound on 05/07/23.   Complete 5 cycles of pembro with very good response. Overall improving and more functioning. Now ambulating by herself without wheelchair anymore! Now completed Cycle 7 and continue on lenvatinib .   Report of right sided pain around the inner right lower face, ear, neck lower ear area with persistent drainage. On exam, no sinus pain, ear canal and tympanic membrane appears normal. No erythema on visible oropharynx or sores. Lung clear bilaterally. No clear signs of infection. Anti-histamine has not helped. I am unsure of her etiology for her symptoms. Will refer to ENT for evaluation.   Assessment & Plan Sinus drainage No sinus tenderness No improvement on antihistamine ENT referral placed Ear pain, right With recurrent drainage. ENT referral placed Metastatic renal cell carcinoma to bone [facility]) Continue lenvatinib  started on 05/07/23 Continue with pembrolizumab  lenvatinib  at 10 mg daily for now Vit D 1000 international units with Calcium  1200 mg daily Started radiation of oliogoprogression of left parietal bone Decreased appetite May use marinol  to 2.5 mg twice daily if needed.  Will keep appointment    Lowanda Ruddy, MD  INTERVAL HISTORY: Patient returns for follow-up because of persistent ear ache. Report of right side lower ear ache down the neck, postnasal drainage and pain. No short of breath, dysphagia, coughing, fever, sinus pain. Zyrtec has not helped.  Oncology History  History of breast cancer   06/11/2011 Surgery   Right breast mastectomy with right axillary sentinel lymph node biopsy 1.3 cm invasive lobular cancer, grade 1, atypical lobular hyperplasia, focal LV I, ER 91%, PR 25%, HER-2 negative, 0/8 lymph nodes T1 cN0 stage IA   07/25/2011 -  Anti-estrogen oral therapy   Letrozole  2.5 mg daily 7 years was a plan   Metastatic renal cell carcinoma to bone (HCC)  02/01/2023 Initial Diagnosis   Renal cell carcinoma (HCC)   02/05/2023 Imaging   Bone scan: No evidence of osseous metastatic disease.    04/03/2023 Pathology Results   A. 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 There are several residual lung nodules identified.   Known bone metastases   No developing new mass lesion or nodal enlargement.   Gallbladder full of stones and is distended, similar to previous.   Pelvic prolapse identified of the anterior posterior compartments.   Metallic foci along the lumen of the cecum are linear and have  appearance of clips. Please correlate for ingested material or intervention.   08/02/2023 Imaging   MRI Brain 1. Negative for brain metastasis. 2. Newly enhancing 9 mm lesion in the high left parietal bone, possible early bony metastasis   NSCLC of left lung (HCC)  03/18/2023 Initial Diagnosis   NSCLC of left lung (HCC)   06/07/2023  Cancer Staging   Staging form: Lung, AJCC 8th Edition - Clinical: cT1, cN1, cM0 - Signed by Lowanda Ruddy, MD on 06/07/2023 Stage prefix: Initial diagnosis Histologic grade (G): GX Histologic grading system: 4 grade system      PHYSICAL EXAMINATION: ECOG PERFORMANCE STATUS: 1 - Symptomatic but completely ambulatory  Vitals:   09/05/23 1548 09/05/23 1549  BP: (!) 155/92 (!) 144/90  Pulse: 95   Resp: 17   Temp: 97.9 F (36.6 C)   SpO2: 100%    Filed Weights   09/05/23 1548  Weight: 164 lb 6.4 oz (74.6 kg)   Gen: no distress ENT: right ear external canal and tympanic membrane appears normal. No erythema on visible oropharynx or sores.  Face: no sinus pain.  Lung clear bilaterally.  Relevant data reviewed during this visit included labs.

## 2023-09-05 NOTE — Assessment & Plan Note (Addendum)
 Continue lenvatinib  started on 05/07/23 Continue with pembrolizumab  lenvatinib  at 10 mg daily for now Vit D 1000 international units with Calcium  1200 mg daily Started radiation of oliogoprogression of left parietal bone

## 2023-09-05 NOTE — Assessment & Plan Note (Addendum)
 With recurrent drainage. ENT referral placed

## 2023-09-05 NOTE — Assessment & Plan Note (Addendum)
 May use marinol  to 2.5 mg twice daily if needed.

## 2023-09-06 ENCOUNTER — Ambulatory Visit

## 2023-09-06 ENCOUNTER — Ambulatory Visit: Admitting: Family Medicine

## 2023-09-09 ENCOUNTER — Other Ambulatory Visit (HOSPITAL_BASED_OUTPATIENT_CLINIC_OR_DEPARTMENT_OTHER): Payer: Self-pay

## 2023-09-09 ENCOUNTER — Ambulatory Visit
Admission: RE | Admit: 2023-09-09 | Discharge: 2023-09-09 | Discharge status: Home / Self Care | Source: Ambulatory Visit | Attending: Radiation Oncology

## 2023-09-09 ENCOUNTER — Other Ambulatory Visit: Payer: Self-pay

## 2023-09-09 DIAGNOSIS — C7931 Secondary malignant neoplasm of brain: Secondary | ICD-10-CM | POA: Diagnosis not present

## 2023-09-09 DIAGNOSIS — C7951 Secondary malignant neoplasm of bone: Secondary | ICD-10-CM | POA: Diagnosis not present

## 2023-09-09 DIAGNOSIS — C649 Malignant neoplasm of unspecified kidney, except renal pelvis: Secondary | ICD-10-CM | POA: Diagnosis not present

## 2023-09-09 LAB — RAD ONC ARIA SESSION SUMMARY
Course Elapsed Days: 6
Plan Fractions Treated to Date: 3
Plan Prescribed Dose Per Fraction: 5 Gy
Plan Total Fractions Prescribed: 5
Plan Total Prescribed Dose: 25 Gy
Reference Point Dosage Given to Date: 15 Gy
Reference Point Session Dosage Given: 5 Gy
Session Number: 3

## 2023-09-11 ENCOUNTER — Ambulatory Visit
Admission: RE | Admit: 2023-09-11 | Discharge: 2023-09-11 | Disposition: A | Discharge status: Home / Self Care | Source: Ambulatory Visit | Attending: Radiation Oncology | Admitting: Radiation Oncology

## 2023-09-11 ENCOUNTER — Other Ambulatory Visit: Payer: Self-pay

## 2023-09-11 DIAGNOSIS — C649 Malignant neoplasm of unspecified kidney, except renal pelvis: Secondary | ICD-10-CM | POA: Diagnosis not present

## 2023-09-11 DIAGNOSIS — C7931 Secondary malignant neoplasm of brain: Secondary | ICD-10-CM | POA: Diagnosis not present

## 2023-09-11 DIAGNOSIS — C7951 Secondary malignant neoplasm of bone: Secondary | ICD-10-CM | POA: Diagnosis not present

## 2023-09-11 LAB — RAD ONC ARIA SESSION SUMMARY
Course Elapsed Days: 8
Plan Fractions Treated to Date: 4
Plan Prescribed Dose Per Fraction: 5 Gy
Plan Total Fractions Prescribed: 5
Plan Total Prescribed Dose: 25 Gy
Reference Point Dosage Given to Date: 20 Gy
Reference Point Session Dosage Given: 5 Gy
Session Number: 4

## 2023-09-13 ENCOUNTER — Ambulatory Visit
Admission: RE | Admit: 2023-09-13 | Discharge: 2023-09-13 | Disposition: A | Discharge status: Home / Self Care | Source: Ambulatory Visit | Attending: Radiation Oncology | Admitting: Radiation Oncology

## 2023-09-13 ENCOUNTER — Other Ambulatory Visit: Payer: Self-pay

## 2023-09-13 DIAGNOSIS — C7951 Secondary malignant neoplasm of bone: Secondary | ICD-10-CM | POA: Diagnosis not present

## 2023-09-13 DIAGNOSIS — Z510 Encounter for antineoplastic radiation therapy: Secondary | ICD-10-CM | POA: Diagnosis not present

## 2023-09-13 DIAGNOSIS — C649 Malignant neoplasm of unspecified kidney, except renal pelvis: Secondary | ICD-10-CM | POA: Diagnosis not present

## 2023-09-13 DIAGNOSIS — C7931 Secondary malignant neoplasm of brain: Secondary | ICD-10-CM | POA: Diagnosis not present

## 2023-09-13 LAB — RAD ONC ARIA SESSION SUMMARY
Course Elapsed Days: 10
Plan Fractions Treated to Date: 5
Plan Prescribed Dose Per Fraction: 5 Gy
Plan Total Fractions Prescribed: 5
Plan Total Prescribed Dose: 25 Gy
Reference Point Dosage Given to Date: 25 Gy
Reference Point Session Dosage Given: 5 Gy
Session Number: 5

## 2023-09-16 ENCOUNTER — Other Ambulatory Visit (HOSPITAL_COMMUNITY): Payer: Self-pay

## 2023-09-16 ENCOUNTER — Other Ambulatory Visit: Payer: Self-pay

## 2023-09-16 ENCOUNTER — Other Ambulatory Visit: Payer: Self-pay | Admitting: Pharmacy Technician

## 2023-09-16 DIAGNOSIS — C641 Malignant neoplasm of right kidney, except renal pelvis: Secondary | ICD-10-CM

## 2023-09-16 NOTE — Progress Notes (Signed)
 Specialty Pharmacy Refill Coordination Note  Diane Paul is a 83 y.o. female contacted today regarding refills of specialty medication(s) Lenvatinib  Mesylate (LENVIMA )  Spoke with Daughter  Patient requested Delivery   Delivery date: 09/27/23   Verified address: [address]  [place]   Medication will be filled on 09/26/23.  This fill date is pending response to refill request from provider. Patient is aware and if they have not received fill by intended date they must follow up with pharmacy.

## 2023-09-16 NOTE — Radiation Completion Notes (Addendum)
  Radiation Oncology         [phone] ________________________________  Name: Diane Paul MRN: [id number]  Date: 09/13/2023  DOB: [birth year]  Referring Physician: Eliane Grooms, M.D. Date of Service: 2023-09-16 Radiation Oncologist: Bartholome Ligas, M.D. [facility]     RADIATION ONCOLOGY END OF TREATMENT NOTE     Diagnosis: 84 yo woman with painful 9 mm left parietal metastasis   Intent: Palliative     ==========DELIVERED PLANS==========  First Treatment Date: 2023-09-03 Last Treatment Date: 2023-09-13   Plan Name: Brain_SRT Site: Brain Technique: SBRT/SRT-IMRT Mode: Photon Dose Per Fraction: 5 Gy Prescribed Dose (Delivered / Prescribed): 25 Gy / 25 Gy Prescribed Fxs (Delivered / Prescribed): 5 / 5     ==========ON TREATMENT VISIT DATES========== 2023-09-03, 2023-09-05, 2023-09-09, 2023-09-11, 2023-09-13, 2023-09-13     See weekly On Treatment Notes in Epic for details in the Media tab (listed as Progress notes on the On Treatment Visit Dates listed above).  She tolerated the treatments well with only modest fatigue.  The patient will receive a call in about one month from the radiation oncology department. She will continue follow up with her medical oncologist, Dr. Alita Irwin, as well.  ------------------------------------------------   Kenith Payer, MD [facility]  Radiation Oncology Direct Dial: [phone]  Fax: [phone] [email or web address]  Skype  LinkedIn

## 2023-09-16 NOTE — Progress Notes (Signed)
 Specialty Pharmacy Ongoing Clinical Assessment Note  Diane Paul is a 83 y.o. female who is being followed by the specialty pharmacy service for RxSp Oncology   Patient's specialty medication(s) reviewed today: Lenvatinib  Mesylate (LENVIMA )   Missed doses in the last 4 weeks: 0   Patient/Caregiver did not have any additional questions or concerns.   Therapeutic benefit summary: Patient is achieving benefit   Adverse events/side effects summary: No adverse events/side effects   Patient's therapy is appropriate to: Continue    Goals Addressed             This Visit's Progress    Maintain optimal adherence to therapy       Patient is on track. Patient will maintain adherence          Follow up: 3 months  Diane Paul M Diane Paul Specialty Pharmacist

## 2023-09-17 ENCOUNTER — Other Ambulatory Visit (HOSPITAL_COMMUNITY): Payer: Self-pay

## 2023-09-17 ENCOUNTER — Other Ambulatory Visit: Payer: Self-pay

## 2023-09-17 MED ORDER — LENVATINIB (10 MG DAILY DOSE) 10 MG PO CPPK
10.0000 mg | ORAL_CAPSULE | Freq: Every day | ORAL | 0 refills | Status: DC
  Filled 2023-09-17 (×2): qty 30, 30d supply, fill #0

## 2023-09-19 NOTE — Progress Notes (Unsigned)
 [facility] OFFICE PROGRESS NOTE  Patient Care Team: Austine Lefort, MD as PCP - General (Family Medicine)  Assessment & Plan   No orders of the defined types were placed in this encounter.    Lowanda Ruddy, MD  INTERVAL HISTORY: Patient returns for follow-up.  Oncology History  History of breast cancer  06/11/2011 Surgery   Right breast mastectomy with right axillary sentinel lymph node biopsy 1.3 cm invasive lobular cancer, grade 1, atypical lobular hyperplasia, focal LV I, ER 91%, PR 25%, HER-2 negative, 0/8 lymph nodes T1 cN0 stage IA   07/25/2011 -  Anti-estrogen oral therapy   Letrozole  2.5 mg daily 7 years was a plan   Metastatic renal cell carcinoma to bone (HCC)  02/01/2023 Initial Diagnosis   Renal cell carcinoma (HCC)   02/05/2023 Imaging   Bone scan: No evidence of osseous metastatic disease.    04/03/2023 Pathology Results   A. 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 There are several residual lung nodules identified.   Known bone metastases   No developing new mass lesion or nodal enlargement.   Gallbladder full of stones and is distended, similar to previous.   Pelvic prolapse identified of the anterior posterior compartments.   Metallic foci along the lumen of the cecum are linear and  have appearance of clips. Please correlate for ingested material or intervention.   08/02/2023 Imaging   MRI Brain 1. Negative for brain metastasis. 2. Newly enhancing 9 mm lesion in the high left parietal bone, possible early bony metastasis   NSCLC of left lung (HCC)  03/18/2023 Initial Diagnosis   NSCLC of left lung (HCC)   06/07/2023 Cancer Staging   Staging form: Lung, AJCC 8th Edition - Clinical: cT1, cN1, cM0 - Signed by Lowanda Ruddy, MD on 06/07/2023 Stage prefix: Initial diagnosis Histologic grade (G): GX Histologic grading system: 4 grade system      PHYSICAL EXAMINATION: ECOG PERFORMANCE STATUS: {CHL ONC ECOG [phone]}  There were no vitals filed for this visit. There were no vitals filed for this visit.  GENERAL: alert, no distress and comfortable SKIN: skin color normal and no bruising or petechiae or jaundice on exposed skin EYES: normal, sclera clear OROPHARYNX: no exudate  NECK: No palpable mass LYMPH:  no palpable cervical, axillary lymphadenopathy  LUNGS: clear to auscultation and percussion with normal breathing effort HEART: regular rate & rhythm  ABDOMEN: abdomen soft, non-tender and nondistended. Musculoskeletal: no edema NEURO: no focal motor/sensory deficits  Relevant data reviewed during this visit included ***

## 2023-09-20 ENCOUNTER — Inpatient Hospital Stay

## 2023-09-20 ENCOUNTER — Other Ambulatory Visit: Payer: Self-pay

## 2023-09-20 ENCOUNTER — Other Ambulatory Visit: Payer: Medicare HMO

## 2023-09-20 ENCOUNTER — Ambulatory Visit: Payer: Medicare HMO

## 2023-09-20 ENCOUNTER — Inpatient Hospital Stay: Admitting: Dietician

## 2023-09-20 VITALS — BP 157/97 | HR 89 | Temp 97.2°F | Resp 16 | Wt 167.2 lb

## 2023-09-20 DIAGNOSIS — C649 Malignant neoplasm of unspecified kidney, except renal pelvis: Secondary | ICD-10-CM

## 2023-09-20 DIAGNOSIS — Z923 Personal history of irradiation: Secondary | ICD-10-CM | POA: Diagnosis not present

## 2023-09-20 DIAGNOSIS — C641 Malignant neoplasm of right kidney, except renal pelvis: Secondary | ICD-10-CM | POA: Diagnosis not present

## 2023-09-20 DIAGNOSIS — R5383 Other fatigue: Secondary | ICD-10-CM | POA: Diagnosis not present

## 2023-09-20 DIAGNOSIS — N1832 Chronic kidney disease, stage 3b: Secondary | ICD-10-CM | POA: Diagnosis not present

## 2023-09-20 DIAGNOSIS — Z9189 Other specified personal risk factors, not elsewhere classified: Secondary | ICD-10-CM

## 2023-09-20 DIAGNOSIS — C3412 Malignant neoplasm of upper lobe, left bronchus or lung: Secondary | ICD-10-CM | POA: Diagnosis not present

## 2023-09-20 DIAGNOSIS — C7801 Secondary malignant neoplasm of right lung: Secondary | ICD-10-CM | POA: Diagnosis not present

## 2023-09-20 DIAGNOSIS — Z5112 Encounter for antineoplastic immunotherapy: Secondary | ICD-10-CM | POA: Diagnosis not present

## 2023-09-20 DIAGNOSIS — C7951 Secondary malignant neoplasm of bone: Secondary | ICD-10-CM

## 2023-09-20 DIAGNOSIS — Z8673 Personal history of transient ischemic attack (TIA), and cerebral infarction without residual deficits: Secondary | ICD-10-CM | POA: Diagnosis not present

## 2023-09-20 DIAGNOSIS — M8580 Other specified disorders of bone density and structure, unspecified site: Secondary | ICD-10-CM

## 2023-09-20 DIAGNOSIS — C7802 Secondary malignant neoplasm of left lung: Secondary | ICD-10-CM | POA: Diagnosis not present

## 2023-09-20 DIAGNOSIS — Z905 Acquired absence of kidney: Secondary | ICD-10-CM | POA: Diagnosis not present

## 2023-09-20 DIAGNOSIS — Z853 Personal history of malignant neoplasm of breast: Secondary | ICD-10-CM | POA: Diagnosis not present

## 2023-09-20 LAB — CMP (CANCER CENTER ONLY)
ALT: 15 U/L (ref 0–44)
AST: 15 U/L (ref 15–41)
Albumin: 3.6 g/dL (ref 3.5–5.0)
Alkaline Phosphatase: 54 U/L (ref 38–126)
Anion gap: 5 (ref 5–15)
BUN: 24 mg/dL — ABNORMAL HIGH (ref 8–23)
CO2: 28 mmol/L (ref 22–32)
Calcium: 8.8 mg/dL — ABNORMAL LOW (ref 8.9–10.3)
Chloride: 108 mmol/L (ref 98–111)
Creatinine: 1.17 mg/dL — ABNORMAL HIGH (ref 0.44–1.00)
GFR, Estimated: 47 mL/min — ABNORMAL LOW (ref >60)
Glucose, Bld: 99 mg/dL (ref 70–99)
Potassium: 4.1 mmol/L (ref 3.5–5.1)
Sodium: 141 mmol/L (ref 135–145)
Total Bilirubin: 0.3 mg/dL (ref 0.0–1.2)
Total Protein: 6.9 g/dL (ref 6.5–8.1)

## 2023-09-20 LAB — CBC WITH DIFFERENTIAL (CANCER CENTER ONLY)
Abs Immature Granulocytes: 0.01 10*3/uL (ref 0.00–0.07)
Basophils Absolute: 0 10*3/uL (ref 0.0–0.1)
Basophils Relative: 1 %
Eosinophils Absolute: 0.2 10*3/uL (ref 0.0–0.5)
Eosinophils Relative: 4 %
HCT: 39 % (ref 36.0–46.0)
Hemoglobin: 12.4 g/dL (ref 12.0–15.0)
Immature Granulocytes: 0 %
Lymphocytes Relative: 32 %
Lymphs Abs: 2 10*3/uL (ref 0.7–4.0)
MCH: 26.8 pg (ref 26.0–34.0)
MCHC: 31.8 g/dL (ref 30.0–36.0)
MCV: 84.4 fL (ref 80.0–100.0)
Monocytes Absolute: 0.7 10*3/uL (ref 0.1–1.0)
Monocytes Relative: 12 %
Neutro Abs: 3.2 10*3/uL (ref 1.7–7.7)
Neutrophils Relative %: 51 %
Platelet Count: 237 10*3/uL (ref 150–400)
RBC: 4.62 MIL/uL (ref 3.87–5.11)
RDW: 13 % (ref 11.5–15.5)
WBC Count: 6.2 10*3/uL (ref 4.0–10.5)
nRBC: 0 % (ref 0.0–0.2)

## 2023-09-20 LAB — TOTAL PROTEIN, URINE DIPSTICK: Protein, ur: NEGATIVE mg/dL

## 2023-09-20 LAB — LACTATE DEHYDROGENASE: LDH: 173 U/L (ref 98–192)

## 2023-09-20 MED ORDER — SODIUM CHLORIDE 0.9 % IV SOLN
200.0000 mg | Freq: Once | INTRAVENOUS | Status: AC
  Administered 2023-09-20: 200 mg via INTRAVENOUS
  Filled 2023-09-20: qty 200

## 2023-09-20 MED ORDER — SODIUM CHLORIDE 0.9% FLUSH
10.0000 mL | Freq: Once | INTRAVENOUS | Status: AC
  Administered 2023-09-20: 10 mL

## 2023-09-20 MED ORDER — SODIUM CHLORIDE 0.9% FLUSH
10.0000 mL | INTRAVENOUS | Status: DC | PRN
Start: 2023-09-20 — End: 2023-09-20
  Administered 2023-09-20: 10 mL

## 2023-09-20 MED ORDER — HEPARIN SOD (PORK) LOCK FLUSH 100 UNIT/ML IV SOLN
500.0000 [IU] | Freq: Once | INTRAVENOUS | Status: AC | PRN
  Administered 2023-09-20: 500 [IU]

## 2023-09-20 MED ORDER — SODIUM CHLORIDE 0.9 % IV SOLN
INTRAVENOUS | Status: DC
Start: 2023-09-20 — End: 2023-09-20

## 2023-09-20 NOTE — Assessment & Plan Note (Addendum)
 Continue lenvatinib  started on 05/07/23 Continue with pembrolizumab  lenvatinib  at 10 mg daily for now Vit D 1000 international units with Calcium  1200 mg daily Completed radiation of oliogoprogression of left parietal bone Repeat MRI of brain in about 3 months

## 2023-09-20 NOTE — Progress Notes (Signed)
 Nutrition Assessment   Reason for Assessment: +MST   ASSESSMENT: 83 year old female with metastatic renal cell carcinoma to bone as well as NSCLC of left lung. She is currently receiving lenvatinib  + keytruda . Pt under the care of Dr. Alita Irwin  Past medical history includes neuromuscular disorder, osteopenia, CKD3, h/o breast cancer (on HT), anemia, h/o stroke  Met with patient in infusion. She reports appetite has improved and eating better. Patient eating 3 meals and drinking one Ensure (unsure of which kind). Recalls egg/cheese and sausage for breakfast this morning. Had chicken, rice and cabbage for dinner. She is unable to recall lunch. Patient denies NIS at this time.   Nutrition Focused Physical Exam: deferred   Medications: zofran , compazine , senna-s   Labs: BUN 24, Cr 1.17   Anthropometrics: Weights trending up - pt 164 lb 6.4 oz on 5/1, 165 lb 4.8 oz on 4/24  Height: 5'7" Weight: 167 lb 3.2 oz UBW: 175 lb (1/17) BMI: 26.19    NUTRITION DIAGNOSIS: Unintended wt loss related to cancer as evidenced by 5% decrease from usual weight in 4 months - insignificant for time frame, however concerning given advanced age and history    INTERVENTION:  Encourage snacks in between meals - handout with ideas provided Continue daily Ensure, recommend Ensure Plus/equivalent for added calories and protein - samples + coupons provided  Discussed soft moist high protein foods for ease of intake Contact information provided    MONITORING, EVALUATION, GOAL: Pt will tolerate adequate calories and protein to minimize further wt loss    Next Visit: No follow-up scheduled. Pt encouraged to call with nutrition questions/concerns

## 2023-09-20 NOTE — Progress Notes (Signed)
 Per Alita Irwin MD, hold Zometa  today due to calcium  8.8

## 2023-09-20 NOTE — Assessment & Plan Note (Addendum)
 Drug monitoring for lenvatinib . Monitor for hypertension Baseline EKG normal QTc LFT, renal function, electrolytes, every 2 weeks for 2 months and then monthly thereafter Baseline TSH and free T4 and then monthly urine dipstick; if 2+ then obtain a 24-hour urine protein Monitor for signs of bleeding, wound healing, thrombosis Monitor for irAEs Will postpone zometa  to next visit.

## 2023-09-20 NOTE — Patient Instructions (Signed)

## 2023-09-20 NOTE — Assessment & Plan Note (Addendum)
 After radiation. Continue rest, fluid intake

## 2023-09-25 ENCOUNTER — Other Ambulatory Visit (HOSPITAL_COMMUNITY): Payer: Self-pay

## 2023-09-26 ENCOUNTER — Other Ambulatory Visit: Payer: Self-pay

## 2023-10-07 ENCOUNTER — Encounter: Payer: Self-pay | Admitting: Family Medicine

## 2023-10-07 ENCOUNTER — Ambulatory Visit: Payer: Self-pay | Admitting: *Deleted

## 2023-10-07 ENCOUNTER — Ambulatory Visit (INDEPENDENT_AMBULATORY_CARE_PROVIDER_SITE_OTHER): Admitting: Family Medicine

## 2023-10-07 VITALS — BP 124/82 | HR 99 | Temp 98.7°F | Ht 67.0 in | Wt 176.4 lb

## 2023-10-07 DIAGNOSIS — S90861A Insect bite (nonvenomous), right foot, initial encounter: Secondary | ICD-10-CM | POA: Diagnosis not present

## 2023-10-07 DIAGNOSIS — M7989 Other specified soft tissue disorders: Secondary | ICD-10-CM

## 2023-10-07 DIAGNOSIS — W57XXXA Bitten or stung by nonvenomous insect and other nonvenomous arthropods, initial encounter: Secondary | ICD-10-CM | POA: Diagnosis not present

## 2023-10-07 MED ORDER — CEPHALEXIN 500 MG PO CAPS: 500.0000 mg | ORAL_CAPSULE | Freq: Three times a day (TID) | ORAL | 0 refills | Status: DC

## 2023-10-07 NOTE — Progress Notes (Signed)
 Patient Office Visit  Assessment & Plan:  Insect bite of right foot, initial encounter -     Cephalexin; Take 1 capsule (500 mg total) by mouth 3 (three) times daily.  Dispense: 30 capsule; Refill: 0 -     CBC  Swelling of right foot -     CBC   Assessment and Plan    Insect bite on right foot Insect bite with pain, swelling, and yellowish discoloration. Possible infection due to immunocompromised status from cancer treatment. Discussed infection risk, antibiotics, and blood tests. She prefers oncologist consultation before tetanus vaccination. - Prescribe antibiotics for potential infection. - Order blood test for white cell count. - Advise sending picture of affected area to clinic email. - Discuss tetanus vaccination; consult oncologist first.  Kidney cancer with lung metastasis Kidney cancer with lung metastasis under suppressive therapy. No symptoms of progression. Oncologist follow-up scheduled. - Continue current cancer treatment regimen. - Follow up with oncologist on Thursday.          No follow-ups on file.   Subjective:     Patient ID: Diane Paul, female    DOB: 1940/11/09  Age: 83 y.o. MRN: [id number]  Chief Complaint  Patient presents with   Insect Bite    Right foot pain x 1 day. She thinks she was bit by ants.     HPI Discussed the use of AI scribe software for clinical note transcription with the patient, who gave verbal consent to proceed.  History of Present Illness         KASI LASKY is an 83 year old female with renal cell carcinoma who presents with a painful lesions on bottom her right foot started Saturday  She was bitten by an insect, possibly ants, on her right foot while outside on Saturday. She was wearing Crocs at the time. The lesion was initially red and painful, and by Sunday morning, it had turned white after soaking in Epsom salt and also soaked the foot in hydrogen peroxide (why it turned whitish in appearance). Patient  had forgotten that she soaked foot in hydrogen peroxide too. The area remains painful, especially when walking. There is no fever or chills. 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 Alita Irwin to manage her cancer. She had breast cancer 13 years ago, which is now resolved. She is on medication for Renal Cell cancer  She took one Tylenol  for pain, which provided some relief. She has no known allergies and is unsure about her tetanus vaccination status. She is concerned about taking live vaccines due to her cancer history. Reassured her that tetanus is not a live vaccine. Patient would like to ask her oncologist re getting tetanus shot  No breathing difficulties or allergic reactions following the bite. No history of similar lesions in the past. Assessment & Plan Insect bite on right foot Insect bite with pain, swelling, and yellowish discoloration. Possible infection due to immunocompromised status from cancer treatment. Discussed infection risk, antibiotics, and blood tests. She prefers oncologist consultation before tetanus vaccination. Patient is not diabetic.  - Prescribe antibiotics for potential infection. - Order blood test for white cell count. - Advise sending picture of affected area to clinic email. - Discuss tetanus vaccination; consult oncologist first.  Kidney cancer with lung metastasis (and bone  mets) Kidney cancer with lung metastasis under suppressive therapy. No symptoms of progression. Oncologist follow-up scheduled. - Continue current cancer treatment regimen. - Follow up with oncologist on Thursday. Daughter took photo of foot but unable to get it uploaded onto her chart. Daughter/son will try to take photo and upload it on her mychart and send it to us  and to her oncologist.    The ASCVD  Risk score (Arnett DK, et al., 2019) failed to calculate for the following reasons:   The 2019 ASCVD risk score is only valid for ages 71 to 49   Risk score cannot be calculated because patient has a medical history suggesting prior/existing ASCVD  Past Medical History:  Diagnosis Date   Anemia    Anxiety    nervous sometimes, denies panica ttacks    Arthritis    knees ?   Blood transfusion    post vaginal birth- 1960, 01/2023- anemia   Breast cancer (HCC)    Right Breast Cancer   Cancer (HCC)    R breast cancer   Dry mouth    GERD (gastroesophageal reflux disease)    Hypertension    Neuromuscular disorder (HCC)    L leg, thigh- "burns sometimes"   Shortness of breath    sometimes    Use of letrozole  (Femara ) 05/07/2010   neoadjuvant femara  therapy since 1/212   Past Surgical History:  Procedure Laterality Date   ABDOMINAL HYSTERECTOMY     BIOPSY  02/03/2023   Procedure: BIOPSY;  Surgeon: Alvis Jourdain, MD;  Location: [facility] ENDOSCOPY;  Service: Gastroenterology;;  gastric   BREAST LUMPECTOMY     BREAST SURGERY     Right   BRONCHIAL BIOPSY  02/12/2023   Procedure: BRONCHIAL BIOPSIES;  Surgeon: Denson Flake, MD;  Location: [facility] ENDOSCOPY;  Service: Pulmonary;;   BRONCHIAL BIOPSY  03/11/2023   Procedure: BRONCHIAL BIOPSIES;  Surgeon: Denson Flake, MD;  Location: [facility] ENDOSCOPY;  Service: Pulmonary;;   BRONCHIAL BRUSHINGS  02/12/2023   Procedure: BRONCHIAL BRUSHINGS;  Surgeon: Denson Flake, MD;  Location: [facility] ENDOSCOPY;  Service: Pulmonary;;   BRONCHIAL BRUSHINGS  03/11/2023   Procedure: BRONCHIAL BRUSHINGS;  Surgeon: Denson Flake, MD;  Location: [facility] ENDOSCOPY;  Service: Pulmonary;;   BRONCHIAL NEEDLE ASPIRATION BIOPSY  02/12/2023   Procedure: BRONCHIAL NEEDLE ASPIRATION BIOPSIES;  Surgeon: Denson Flake, MD;  Location: [facility] ENDOSCOPY;  Service: Pulmonary;;   BRONCHIAL NEEDLE ASPIRATION BIOPSY  03/11/2023   Procedure: BRONCHIAL NEEDLE ASPIRATION BIOPSIES;  Surgeon: Denson Flake, MD;  Location: [facility] ENDOSCOPY;  Service: Pulmonary;;   BRONCHIAL WASHINGS  02/12/2023   Procedure: BRONCHIAL WASHINGS;  Surgeon: Denson Flake, MD;  Location: [facility] ENDOSCOPY;  Service: Pulmonary;;   COLONOSCOPY WITH PROPOFOL  N/A 02/03/2023   Procedure: COLONOSCOPY WITH PROPOFOL ;  Surgeon: Alvis Jourdain, MD;  Location: [facility] ENDOSCOPY;  Service: Gastroenterology;  Laterality: N/A;   ESOPHAGOGASTRODUODENOSCOPY (EGD) WITH PROPOFOL  N/A 02/03/2023   Procedure: ESOPHAGOGASTRODUODENOSCOPY (EGD) WITH PROPOFOL ;  Surgeon: Alvis Jourdain, MD;  Location: [facility] ENDOSCOPY;  Service: Gastroenterology;  Laterality: N/A;   HEMOSTASIS CLIP PLACEMENT  02/03/2023   Procedure: HEMOSTASIS CLIP PLACEMENT;  Surgeon: Alvis Jourdain, MD;  Location: [facility] ENDOSCOPY;  Service: Gastroenterology;;  ascending colon polyp   IR IMAGING GUIDED PORT INSERTION  04/30/2023   jp drains     s/p masectomy 06/11/11/ Dr. Boyce Byes, 2 jp drains right chest wall   MASTECTOMY Right    malignant   MASTECTOMY W/ SENTINEL NODE BIOPSY  06/11/2011/Right BReast   Procedure: MASTECTOMY  WITH SENTINEL LYMPH NODE BIOPSY;  Surgeon: Levert Ready, MD;  Location: [facility] OR;  Service: General;  Laterality: Right;  Right total mastectomy and sentinel lymph node biopsy using lymphatic mapping and blue dye injection.   POLYPECTOMY  02/03/2023   Procedure: POLYPECTOMY;  Surgeon: Alvis Jourdain, MD;  Location: [facility] ENDOSCOPY;  Service: Gastroenterology;;  hot snare polypectomy ascending colon   ROBOT ASSISTED LAPAROSCOPIC NEPHRECTOMY Right 04/03/2023   Procedure: XI ROBOTIC ASSISTED RIGHT LAPAROSCOPIC RADICAL NEPHRECTOMY;  Surgeon: Melody Spurling., MD;  Location: [facility] ORS;  Service: Urology;  Laterality: Right;  180 MINUTES   SUBMUCOSAL LIFTING INJECTION  02/03/2023   Procedure: SUBMUCOSAL LIFTING INJECTION;  Surgeon: Alvis Jourdain, MD;  Location: [facility] ENDOSCOPY;  Service: Gastroenterology;;  ever lift 16cc ascending colon   TUBAL LIGATION     US  FINE NEEDLE ASPIRATION WO/  W SMEAR  05/22/10   Left Breast- cyst or abscess    Social History   Tobacco Use   Smoking status: Never   Smokeless tobacco: Never  Vaping Use   Vaping status: Never Used  Substance Use Topics   Alcohol use: No   Drug use: No   Family History  Problem Relation Age of Onset   Breast cancer Sister    Anesthesia problems Neg Hx    Hypotension Neg Hx    Malignant hyperthermia Neg Hx    Pseudochol deficiency Neg Hx    Allergies  Allergen Reactions   Fosamax  [Alendronate  Sodium] Other (See Comments)    Aches and pains    ROS    Objective:    BP 124/82   Pulse 99   Temp 98.7 F (37.1 C)   Ht 5\' 7"  (1.702 m)   Wt 176 lb 6 oz (80 kg)   SpO2 99%   BMI 27.62 kg/m  BP Readings from Last 3 Encounters:  10/07/23 124/82  09/20/23 (!) 157/97  09/05/23 (!) 144/90   Wt Readings from Last 3 Encounters:  10/07/23 176 lb 6 oz (80 kg)  09/20/23 167 lb 3.2 oz (75.8 kg)  09/05/23 164 lb 6.4 oz (74.6 kg)    Physical Exam Vitals and nursing note reviewed.  Constitutional:      General: She is not in acute distress.    Appearance: Normal appearance.  HENT:     Head: Normocephalic.     Right Ear: Tympanic membrane normal.     Left Ear: Tympanic membrane normal.  Eyes:     Extraocular Movements: Extraocular movements intact.     Conjunctiva/sclera: Conjunctivae normal.     Pupils: Pupils are equal, round, and reactive to light.  Cardiovascular:     Rate and Rhythm: Normal rate and regular rhythm.     Heart sounds: Normal heart sounds.  Pulmonary:     Effort: Pulmonary effort is normal.     Breath sounds: Normal breath sounds. No wheezing.  Musculoskeletal:     Right lower leg: Edema present.     Left lower leg: Edema present.     Comments: Ankles- swelling noted bilaterally, right is worse.  Right foot/ankle more swollen compared to left. Pt has 2 whitish/yellowish lesions bottom foot, tender to palpation, no opening, not warm to the touch  Skin:    Findings: Lesion  present.     Comments: Two white lesions bottom right foot. No opening noted.   Neurological:     General: No focal deficit present.     Mental Status: She is alert and oriented to person, place,  and time.  Psychiatric:        Attention and Perception: Attention normal.        Mood and Affect: Affect is tearful.      No results found for any visits on 10/07/23.

## 2023-10-07 NOTE — Telephone Encounter (Signed)
 Daughter called in on [place].  Chief Complaint: possible insect bite bottom right foot red, swelling  Symptoms: pain, itching swelling redness and white area where bite located bottom of right foot. Painful to walk. Soaked foot in epsom salt and area of bite turned white. No drainage reported Frequency: Saturday  Pertinent Negatives: Patient denies chest pain no difficulty breathing no fever. Disposition: [] ED /[] Urgent Care (no appt availability in office) / [x] Appointment(In office/virtual)/ []  [facility]/ [] Home Care/ [] Refused Recommended Disposition /[] [facility]/ []  Follow-up with PCP Additional Notes:   No available with PCP. Appt scheduled with other provider today .       Copied from CRM [id number]. Topic: Clinical - Red Word Triage >> Oct 07, 2023  8:26 AM Baldemar Lev wrote: Red Word that prompted transfer to Nurse Triage: Possible insect bite, swelling/pain. Pt's daughter called and is currently not with the patient. Reason for Disposition  [1] Red or very tender (to touch) area AND [2] started over 24 hours after the bite  Answer Assessment - Initial Assessment Questions 1. TYPE of INSECT: "What type of insect was it?"      Not sure  2. ONSET: "When did you get bitten?"      Saturday  3. LOCATION: "Where is the insect bite located?"      Bottom of right foot 4. REDNESS: "Is the area red or pink?" If Yes, ask: "What size is area of redness?" (inches or cm). "When did the redness start?"     White in color where bite located and redness to foot 5. PAIN: "Is there any pain?" If Yes, ask: "How bad is it?"  (Scale 1-10; or mild, moderate, severe)     Yes  6. ITCHING: "Does it itch?" If Yes, ask: "How bad is the itch?"    - MILD: doesn't interfere with normal activities   - MODERATE-SEVERE: interferes with work, school, sleep, or other activities      Itching  7. SWELLING: "How big is the swelling?" (inches, cm, or compare to coins)     Swelling side of  toe and bottom on foot size of quarter 8. OTHER SYMPTOMS: "Do you have any other symptoms?"  (e.g., difficulty breathing, hives)     No  9. PREGNANCY: "Is there any chance you are pregnant?" "When was your last menstrual period?"     na  Protocols used: Insect Bite-A-AH

## 2023-10-08 ENCOUNTER — Ambulatory Visit: Payer: Self-pay | Admitting: Family Medicine

## 2023-10-08 ENCOUNTER — Other Ambulatory Visit: Payer: Self-pay

## 2023-10-08 LAB — CBC
HCT: 41.2 % (ref 35.0–45.0)
Hemoglobin: 13.1 g/dL (ref 11.7–15.5)
MCH: 27 pg (ref 27.0–33.0)
MCHC: 31.8 g/dL — ABNORMAL LOW (ref 32.0–36.0)
MCV: 84.9 fL (ref 80.0–100.0)
MPV: 11.3 fL (ref 7.5–12.5)
Platelets: 205 10*3/uL (ref 140–400)
RBC: 4.85 10*6/uL (ref 3.80–5.10)
RDW: 13.6 % (ref 11.0–15.0)
WBC: 6.7 10*3/uL (ref 3.8–10.8)

## 2023-10-09 NOTE — Progress Notes (Signed)
  Radiation Oncology         [phone] ________________________________  Name: Diane Paul MRN: [id number]  Date of Service: 10/15/2023  DOB: 11-17-1940  Post Treatment Telephone Note  Diagnosis:  C79.31 Secondary malignant neoplasm of brain (as documented in provider EOT note)   The patient was available for call today.  The patient did note mild fatigue during radiation. The patient did not note hair loss or skin changes in the field of radiation during therapy. The patient is not taking dexamethasone . The patient does not have symptoms of  weakness or loss of control of the extremities. The patient does not have symptoms of headache. The patient does not have symptoms of seizure or uncontrolled movement. The patient does not have symptoms of changes in vision. The patient does not have changes in speech. The patient does not have confusion.   The patient was counseled that she will be contacted by our brain and spine navigator to schedule surveillance imaging. The patient was encouraged to call if she have not received a call to schedule imaging, or if shedevelops concerns or questions regarding radiation. The patient will also continue to follow up with Dr. Alita Irwin in medical oncology.  This concludes the interaction.  Avery Bodo, LPN

## 2023-10-09 NOTE — Progress Notes (Unsigned)
 [facility] OFFICE PROGRESS NOTE  Patient Care Team: Austine Lefort, MD as PCP - General (Family Medicine)  83 y.o.w. 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   Diagnosis: stage IV clear cell RCC Treatment: Right nephrectomy. 04/26/23 started pembrolizumab . Delayed started of Lenvatinib  at 10 mg daily due to wound on 05/07/23.   Complete 5 cycles of pembro with very good response. Overall improving and more functioning. Now ambulating by herself without wheelchair anymore! Now completed Cycle 7 and continue on lenvatinib .  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  Will continue monitoring. Stage 3a chronic kidney disease (HCC) Cr up today Increase fluid intake Monitor with renal function every cycle At high risk for fracture Bone metastases Patient has no dentaI issues and teeth.  Started Zometa  3 mg every 3 months in Feb 2025 Continue zometa . No jaw pain Next zometa  today   Lowanda Ruddy, MD  INTERVAL HISTORY: Patient returns for follow-up. Taking her lenvatinib  daily. No missing dose. No mouth sores.  No chest pain, sob, coughing, stomach pain, n/v, diarrhea, bloody stool, dark stool, hematuria, difficulty.  No rash, joint swelling.  No new headaches.  Oncology History  History of breast cancer  06/11/2011 Surgery   Right breast mastectomy with right axillary sentinel lymph node biopsy 1.3 cm invasive lobular cancer, grade 1, atypical lobular hyperplasia, focal LV I, ER 91%, PR 25%, HER-2  negative, 0/8 lymph nodes T1 cN0 stage IA   07/25/2011 -  Anti-estrogen oral therapy   Letrozole  2.5 mg daily 7 years was a plan   Metastatic renal cell carcinoma to bone (HCC)  02/01/2023 Initial Diagnosis   Renal cell carcinoma (HCC)   02/05/2023 Imaging   Bone scan: No evidence of osseous metastatic disease.    04/03/2023 Pathology Results   A. 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 There are several residual lung nodules identified.   Known bone metastases   No developing new mass lesion or nodal enlargement.   Gallbladder full of stones and is distended, similar to previous.   Pelvic prolapse identified of the anterior posterior compartments.   Metallic foci along the lumen of the cecum are linear and have appearance of clips. Please correlate for ingested material or intervention.   08/02/2023 Imaging   MRI Brain 1. Negative for brain metastasis. 2. Newly enhancing 9 mm lesion in the high  left parietal bone, possible early bony metastasis   NSCLC of left lung (HCC)  03/18/2023 Initial Diagnosis   NSCLC of left lung (HCC)   06/07/2023 Cancer Staging   Staging form: Lung, AJCC 8th Edition - Clinical: cT1, cN1, cM0 - Signed by Lowanda Ruddy, MD on 06/07/2023 Stage prefix: Initial diagnosis Histologic grade (G): GX Histologic  grading system: 4 grade system      PHYSICAL EXAMINATION: ECOG PERFORMANCE STATUS: 1 - Symptomatic but completely ambulatory  Vitals:   10/10/23 0840 10/10/23 0847  BP: (!) 153/96 (!) 151/94  Pulse: 75   Resp: 18   Temp: (!) 97 F (36.1 C)   SpO2: 99%    Filed Weights   10/10/23 0840  Weight: 178 lb 1.6 oz (80.8 kg)    GENERAL: alert, no distress and comfortable SKIN: skin color normal and no rash on exposed skin EYES: sclera clear OROPHARYNX: no exudate  NECK: No palpable mass LYMPH:  no palpable cervical lymphadenopathy  LUNGS: clear to auscultation and percussion with normal breathing effort HEART: regular rate & rhythm  ABDOMEN: abdomen soft, non-tender and nondistended. Musculoskeletal: trace bilateral edema NEURO: no focal motor/sensory deficits  Relevant data reviewed during this visit included labs.

## 2023-10-10 ENCOUNTER — Ambulatory Visit

## 2023-10-10 ENCOUNTER — Inpatient Hospital Stay

## 2023-10-10 ENCOUNTER — Ambulatory Visit: Payer: Self-pay

## 2023-10-10 ENCOUNTER — Other Ambulatory Visit

## 2023-10-10 VITALS — BP 151/94 | HR 75 | Temp 97.0°F | Resp 18 | Wt 178.1 lb

## 2023-10-10 DIAGNOSIS — Z79620 Long term (current) use of immunosuppressive biologic: Secondary | ICD-10-CM | POA: Insufficient documentation

## 2023-10-10 DIAGNOSIS — C7802 Secondary malignant neoplasm of left lung: Secondary | ICD-10-CM | POA: Insufficient documentation

## 2023-10-10 DIAGNOSIS — M8580 Other specified disorders of bone density and structure, unspecified site: Secondary | ICD-10-CM

## 2023-10-10 DIAGNOSIS — C649 Malignant neoplasm of unspecified kidney, except renal pelvis: Secondary | ICD-10-CM

## 2023-10-10 DIAGNOSIS — Z853 Personal history of malignant neoplasm of breast: Secondary | ICD-10-CM | POA: Diagnosis not present

## 2023-10-10 DIAGNOSIS — Z905 Acquired absence of kidney: Secondary | ICD-10-CM | POA: Diagnosis not present

## 2023-10-10 DIAGNOSIS — C641 Malignant neoplasm of right kidney, except renal pelvis: Secondary | ICD-10-CM | POA: Diagnosis not present

## 2023-10-10 DIAGNOSIS — C7801 Secondary malignant neoplasm of right lung: Secondary | ICD-10-CM | POA: Insufficient documentation

## 2023-10-10 DIAGNOSIS — C7951 Secondary malignant neoplasm of bone: Secondary | ICD-10-CM | POA: Insufficient documentation

## 2023-10-10 DIAGNOSIS — C3412 Malignant neoplasm of upper lobe, left bronchus or lung: Secondary | ICD-10-CM | POA: Insufficient documentation

## 2023-10-10 DIAGNOSIS — N1832 Chronic kidney disease, stage 3b: Secondary | ICD-10-CM | POA: Insufficient documentation

## 2023-10-10 DIAGNOSIS — Z5112 Encounter for antineoplastic immunotherapy: Secondary | ICD-10-CM | POA: Diagnosis not present

## 2023-10-10 DIAGNOSIS — Z923 Personal history of irradiation: Secondary | ICD-10-CM | POA: Diagnosis not present

## 2023-10-10 DIAGNOSIS — N1831 Chronic kidney disease, stage 3a: Secondary | ICD-10-CM | POA: Diagnosis not present

## 2023-10-10 DIAGNOSIS — Z9189 Other specified personal risk factors, not elsewhere classified: Secondary | ICD-10-CM

## 2023-10-10 DIAGNOSIS — Z8673 Personal history of transient ischemic attack (TIA), and cerebral infarction without residual deficits: Secondary | ICD-10-CM

## 2023-10-10 DIAGNOSIS — E039 Hypothyroidism, unspecified: Secondary | ICD-10-CM

## 2023-10-10 LAB — CMP (CANCER CENTER ONLY)
ALT: 15 U/L (ref 0–44)
AST: 20 U/L (ref 15–41)
Albumin: 3.8 g/dL (ref 3.5–5.0)
Alkaline Phosphatase: 55 U/L (ref 38–126)
Anion gap: 6 (ref 5–15)
BUN: 23 mg/dL (ref 8–23)
CO2: 29 mmol/L (ref 22–32)
Calcium: 8.8 mg/dL — ABNORMAL LOW (ref 8.9–10.3)
Chloride: 105 mmol/L (ref 98–111)
Creatinine: 1.47 mg/dL — ABNORMAL HIGH (ref 0.44–1.00)
GFR, Estimated: 35 mL/min — ABNORMAL LOW (ref >60)
Glucose, Bld: 116 mg/dL — ABNORMAL HIGH (ref 70–99)
Potassium: 4.5 mmol/L (ref 3.5–5.1)
Sodium: 140 mmol/L (ref 135–145)
Total Bilirubin: 0.3 mg/dL (ref 0.0–1.2)
Total Protein: 7 g/dL (ref 6.5–8.1)

## 2023-10-10 LAB — CBC WITH DIFFERENTIAL (CANCER CENTER ONLY)
Abs Immature Granulocytes: 0.01 10*3/uL (ref 0.00–0.07)
Basophils Absolute: 0 10*3/uL (ref 0.0–0.1)
Basophils Relative: 1 %
Eosinophils Absolute: 0.2 10*3/uL (ref 0.0–0.5)
Eosinophils Relative: 4 %
HCT: 40.4 % (ref 36.0–46.0)
Hemoglobin: 12.7 g/dL (ref 12.0–15.0)
Immature Granulocytes: 0 %
Lymphocytes Relative: 36 %
Lymphs Abs: 2.1 10*3/uL (ref 0.7–4.0)
MCH: 26.7 pg (ref 26.0–34.0)
MCHC: 31.4 g/dL (ref 30.0–36.0)
MCV: 85.1 fL (ref 80.0–100.0)
Monocytes Absolute: 0.6 10*3/uL (ref 0.1–1.0)
Monocytes Relative: 10 %
Neutro Abs: 2.9 10*3/uL (ref 1.7–7.7)
Neutrophils Relative %: 49 %
Platelet Count: 174 10*3/uL (ref 150–400)
RBC: 4.75 MIL/uL (ref 3.87–5.11)
RDW: 14.3 % (ref 11.5–15.5)
WBC Count: 5.9 10*3/uL (ref 4.0–10.5)
nRBC: 0 % (ref 0.0–0.2)

## 2023-10-10 LAB — TSH: TSH: 74.7 u[IU]/mL — ABNORMAL HIGH (ref 0.350–4.500)

## 2023-10-10 LAB — LACTATE DEHYDROGENASE: LDH: 214 U/L — ABNORMAL HIGH (ref 98–192)

## 2023-10-10 LAB — T4, FREE: Free T4: <0.25 ng/dL — ABNORMAL LOW (ref 0.61–1.12)

## 2023-10-10 LAB — TOTAL PROTEIN, URINE DIPSTICK: Protein, ur: NEGATIVE mg/dL

## 2023-10-10 MED ORDER — SODIUM CHLORIDE 0.9 % IV SOLN: INTRAVENOUS | Status: DC

## 2023-10-10 MED ORDER — SODIUM CHLORIDE 0.9% FLUSH
10.0000 mL | Freq: Once | INTRAVENOUS | Status: AC
  Administered 2023-10-10: 10 mL

## 2023-10-10 MED ORDER — SODIUM CHLORIDE 0.9 % IV SOLN
200.0000 mg | Freq: Once | INTRAVENOUS | Status: AC
  Administered 2023-10-10: 200 mg via INTRAVENOUS
  Filled 2023-10-10: qty 200

## 2023-10-10 MED ORDER — LEVOTHYROXINE SODIUM 50 MCG PO TABS
50.0000 ug | ORAL_TABLET | Freq: Every day | ORAL | 1 refills | Status: DC
Start: 2023-10-10 — End: 2023-12-02

## 2023-10-10 MED ORDER — ZOLEDRONIC ACID 4 MG/5ML IV CONC
3.0000 mg | Freq: Once | INTRAVENOUS | Status: AC
  Administered 2023-10-10: 3 mg via INTRAVENOUS
  Filled 2023-10-10: qty 3.75

## 2023-10-10 NOTE — Telephone Encounter (Signed)
-----   Message from Lowanda Ruddy sent at 10/10/2023  3:53 PM EDT ----- Would you let daughter know the thyroid  is low. Will start levothyroxine 50 mcg daily tomorrow morning. I sent a script to her pharmacy at [facility]. Thanks.

## 2023-10-10 NOTE — Assessment & Plan Note (Addendum)
 Bone metastases Patient has no dentaI issues and teeth.  Started Zometa  3 mg every 3 months in Feb 2025 Continue zometa . No jaw pain Next zometa  today

## 2023-10-10 NOTE — Assessment & Plan Note (Addendum)
 Continue lenvatinib  started on 05/07/23 Continue with pembrolizumab  lenvatinib  at 10 mg daily for now Vit D 1000 international units with Calcium  1200 mg daily Completed radiation of oliogoprogression of left parietal bone Repeat MRI of brain in about 3 months

## 2023-10-10 NOTE — Telephone Encounter (Signed)
 LM with note below

## 2023-10-10 NOTE — Addendum Note (Signed)
 Encounter addended by: Romey Cohea, PA-C on: 10/10/2023 3:42 PM  Actions taken: Clinical Note Signed

## 2023-10-10 NOTE — Assessment & Plan Note (Addendum)
 Cr up today Increase fluid intake Monitor with renal function every cycle

## 2023-10-10 NOTE — Assessment & Plan Note (Addendum)
 Aspirin 81 mg daily Asymptomatic.  Will continue monitoring.

## 2023-10-10 NOTE — Patient Instructions (Signed)
 [facility] - A DEPT OF [facility]  Discharge Instructions: Thank you for choosing [facility] to provide your oncology and hematology care.   If you have a lab appointment with the Cancer Center, please go directly to the Cancer Center and check in at the registration area.   Wear comfortable clothing and clothing appropriate for easy access to any Portacath or PICC line.   We strive to give you quality time with your provider. You may need to reschedule your appointment if you arrive late (15 or more minutes).  Arriving late affects you and other patients whose appointments are after yours.  Also, if you miss three or more appointments without notifying the office, you may be dismissed from the clinic at the provider's discretion.      For prescription refill requests, have your pharmacy contact our office and allow 72 hours for refills to be completed.    Today you received the following chemotherapy and/or immunotherapy agents: Keytruda  & Zometa       To help prevent nausea and vomiting after your treatment, we encourage you to take your nausea medication as directed.  BELOW ARE SYMPTOMS THAT SHOULD BE REPORTED IMMEDIATELY: *FEVER GREATER THAN 100.4 F (38 C) OR HIGHER *CHILLS OR SWEATING *NAUSEA AND VOMITING THAT IS NOT CONTROLLED WITH YOUR NAUSEA MEDICATION *UNUSUAL SHORTNESS OF BREATH *UNUSUAL BRUISING OR BLEEDING *URINARY PROBLEMS (pain or burning when urinating, or frequent urination) *BOWEL PROBLEMS (unusual diarrhea, constipation, pain near the anus) TENDERNESS IN MOUTH AND THROAT WITH OR WITHOUT PRESENCE OF ULCERS (sore throat, sores in mouth, or a toothache) UNUSUAL RASH, SWELLING OR PAIN  UNUSUAL VAGINAL DISCHARGE OR ITCHING   Items with * indicate a potential emergency and should be followed up as soon as possible or go to the Emergency Department if any problems should occur.  Please show the CHEMOTHERAPY ALERT CARD or  IMMUNOTHERAPY ALERT CARD at check-in to the Emergency Department and triage nurse.  Should you have questions after your visit or need to cancel or reschedule your appointment, please contact [facility] - A DEPT OF [facility]  Dept: [phone]  and follow the prompts.  Office hours are 8:00 a.m. to 4:30 p.m. Monday - Friday. Please note that voicemails left after 4:00 p.m. may not be returned until the following business day.  We are closed weekends and major holidays. You have access to a nurse at all times for urgent questions. Please call the main number to the clinic Dept: [phone] and follow the prompts.   For any non-urgent questions, you may also contact your provider using MyChart. We now offer e-Visits for anyone 33 and older to request care online for non-urgent symptoms. For details visit [email or web address].   Also download the MyChart app! Go to the app store, search "MyChart", open the app, select [place], and log in with your MyChart username and password.

## 2023-10-15 ENCOUNTER — Ambulatory Visit
Admission: RE | Admit: 2023-10-15 | Discharge: 2023-10-15 | Disposition: A | Discharge status: Home / Self Care | Source: Ambulatory Visit | Attending: Family Medicine | Admitting: Family Medicine

## 2023-10-18 ENCOUNTER — Other Ambulatory Visit (HOSPITAL_COMMUNITY): Payer: Self-pay

## 2023-10-18 ENCOUNTER — Other Ambulatory Visit: Payer: Self-pay

## 2023-10-18 DIAGNOSIS — C641 Malignant neoplasm of right kidney, except renal pelvis: Secondary | ICD-10-CM

## 2023-10-21 ENCOUNTER — Other Ambulatory Visit: Payer: Self-pay

## 2023-10-22 ENCOUNTER — Other Ambulatory Visit: Payer: Self-pay

## 2023-10-22 MED ORDER — LENVATINIB (10 MG DAILY DOSE) 10 MG PO CPPK
10.0000 mg | ORAL_CAPSULE | Freq: Every day | ORAL | 0 refills | Status: DC
  Filled 2023-10-22 – 2023-10-25 (×3): qty 30, 30d supply, fill #0

## 2023-10-23 ENCOUNTER — Other Ambulatory Visit: Payer: Self-pay

## 2023-10-25 ENCOUNTER — Other Ambulatory Visit: Payer: Self-pay

## 2023-10-25 ENCOUNTER — Other Ambulatory Visit (HOSPITAL_COMMUNITY): Payer: Self-pay

## 2023-10-25 NOTE — Progress Notes (Addendum)
 Specialty Pharmacy Refill Coordination Note  Diane Paul is a 83 y.o. female contacted today regarding refills of specialty medication(s) Lenvatinib  Mesylate (LENVIMA )  Spoke with patient's daughter  Patient requested Delivery   Delivery date: 10/29/23   Verified address: [address]  [place]   Medication will be filled on 06.23.25.

## 2023-10-26 ENCOUNTER — Other Ambulatory Visit: Payer: Self-pay

## 2023-10-31 NOTE — Progress Notes (Signed)
 [facility] OFFICE PROGRESS NOTE  Patient Care Team: Duanne Butler DASEN, MD as PCP - General (Family Medicine)  83 y.o.w. 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   Diagnosis: stage IV clear cell RCC Treatment: Right nephrectomy. 04/26/23 started pembrolizumab . Delayed started of Lenvatinib  at 10 mg daily due to wound on 05/07/23.   Complete 5 cycles of pembro with very good response. Overall improving and more functioning. Now ambulating by herself without wheelchair anymore!   Now completed Cycle 9 and continue on lenvatinib .   She developed hypothyroidism. Started replacement since last visit.  Continue to monitor potential irAE. 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  Started Zometa  3 mg every 3 months in Feb 2025 Continue zometa . No jaw pain Last zometa  on 6/5 Stage 3b chronic kidney disease (HCC) Continue to increase fluid intake Monitor with renal function every cycle Acquired hypothyroidism Continue levothyroxine  Repeat fT4  Orders Placed This Encounter  Procedures   CT CHEST ABDOMEN PELVIS W CONTRAST    Standing Status:   Future    Expected Date:   11/28/2023    Expiration Date:   10/31/2024    If indicated for the ordered procedure, I authorize the administration of contrast media per Radiology protocol:   Yes    Does the patient have a contrast media/X-ray dye allergy?:   No    Preferred imaging location?:   [facility]    If indicated for the ordered procedure, I authorize the administration of oral contrast media per Radiology protocol:   Yes   MR Brain W Wo Contrast    Standing Status:   Future    Expected Date:   11/29/2023    Expiration Date:   10/31/2024    If indicated for the ordered procedure, I authorize the administration of contrast media per Radiology protocol:   Yes    What is the patient's sedation requirement?:   No Sedation    Does the patient have a pacemaker or implanted devices?:   No    Use SRS Protocol?:   Yes    Preferred imaging location?:   [facility] (table limit - 500lbs)   T4, free    Standing Status:   Future    Expected Date:   11/21/2023    Expiration Date:   11/20/2024   T4, free    Standing Status:   Future    Expected Date:   12/12/2023    Expiration Date:   12/11/2024   T4, free    Standing Status:   Future    Expected Date:   02/13/2024    Expiration Date:   02/12/2025   T4, free    Standing Status:   Future    Expected Date:   04/16/2024    Expiration Date:   04/16/2025   T4, free    Standing Status:   Future    Expected Date:   01/02/2024    Expiration Date:  01/01/2025   T4, free    Standing Status:   Future    Expected Date:   01/23/2024    Expiration Date:   01/22/2025   T4, free    Standing Status:   Future    Expected Date:   03/05/2024    Expiration Date:   03/05/2025   T4, free    Standing Status:   Future    Expected Date:   03/26/2024    Expiration Date:   03/26/2025   T4, free    Standing Status:   Future    Expected Date:   11/01/2023    Expiration Date:   10/31/2024   T4, free    Add on    Standing Status:   Future    Expected Date:   11/01/2023    Expiration Date:   10/31/2024     Pauletta JAYSON Chihuahua, MD  INTERVAL HISTORY: Patient returns for follow-up. A little tired but no more than before. No swelling. No trouble urinating. Good u/o. She tries to drink about 64 oz per day. No chest pain, coughing, short of breath, stomach,  nausea, vomiting, diarrhea. Bowel movement daily. No bloody stool, dark stool, bloody urine. No new headache, focal weakness.   No rash, joint swelling.  Oncology History  History of breast cancer  06/11/2011 Surgery   Right breast mastectomy with right axillary sentinel lymph node biopsy 1.3 cm invasive lobular cancer, grade 1, atypical lobular hyperplasia, focal LV I, ER 91%, PR 25%, HER-2 negative, 0/8 lymph nodes T1 cN0 stage IA   07/25/2011 -  Anti-estrogen oral therapy   Letrozole  2.5 mg daily 7 years was a plan   Metastatic renal cell carcinoma to bone (HCC)  02/01/2023 Initial Diagnosis   Renal cell carcinoma (HCC)   02/05/2023 Imaging   Bone scan: No evidence of osseous metastatic disease.    04/03/2023 Pathology Results   A. 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 There are several residual lung nodules identified.   Known bone metastases   No developing new mass lesion or nodal enlargement.   Gallbladder full of stones and is distended, similar to previous.   Pelvic prolapse identified of the anterior posterior compartments.   Metallic foci along the lumen of the cecum are linear and have appearance of clips. Please correlate for ingested material or intervention.   08/02/2023 Imaging   MRI Brain 1. Negative for brain  metastasis. 2. Newly enhancing 9 mm lesion in the high left parietal bone, possible early bony metastasis   NSCLC of left lung (HCC)  03/18/2023 Initial Diagnosis   NSCLC of left lung (HCC)   06/07/2023 Cancer Staging   Staging form: Lung, AJCC 8th Edition - Clinical: cT1, cN1, cM0 - Signed by Chihuahua Pauletta JAYSON, MD on 06/07/2023 Stage prefix: Initial diagnosis Histologic  grade (G): GX Histologic grading system: 4 grade system      PHYSICAL EXAMINATION: ECOG PERFORMANCE STATUS: 1 - Symptomatic but completely ambulatory  Vitals:   11/01/23 0826 11/01/23 0829  BP: (!) 151/97 (!) 148/94  Pulse: 81   Resp: 18   Temp: (!) 97.3 F (36.3 C)   SpO2: 100%    Filed Weights   11/01/23 0826  Weight: 184 lb (83.5 kg)    GENERAL: alert, no distress and comfortable SKIN: skin color normal and no bruising or petechiae or jaundice on exposed skin EYES: normal, sclera clear OROPHARYNX: no exudate  NECK: No palpable mass LYMPH:  no palpable cervical, axillary lymphadenopathy  LUNGS: clear to auscultation and percussion with normal breathing effort HEART: regular rate & rhythm  ABDOMEN: abdomen soft, non-tender and nondistended. Musculoskeletal: no edema NEURO: no focal motor/sensory deficits  Relevant data reviewed during this visit included labs

## 2023-10-31 NOTE — Assessment & Plan Note (Addendum)
 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

## 2023-10-31 NOTE — Assessment & Plan Note (Addendum)
 Bone metastases Patient has no dentaI issues and teeth.  Started Zometa  3 mg every 3 months in Feb 2025 Continue zometa . No jaw pain Last zometa  on 6/5

## 2023-10-31 NOTE — Assessment & Plan Note (Addendum)
 Continue to increase fluid intake Monitor with renal function every cycle

## 2023-11-01 ENCOUNTER — Ambulatory Visit: Payer: Self-pay

## 2023-11-01 ENCOUNTER — Other Ambulatory Visit: Payer: Self-pay | Admitting: *Deleted

## 2023-11-01 ENCOUNTER — Inpatient Hospital Stay

## 2023-11-01 ENCOUNTER — Inpatient Hospital Stay (HOSPITAL_BASED_OUTPATIENT_CLINIC_OR_DEPARTMENT_OTHER)

## 2023-11-01 ENCOUNTER — Other Ambulatory Visit: Payer: Self-pay

## 2023-11-01 VITALS — BP 148/94 | HR 81 | Temp 97.3°F | Resp 18 | Wt 184.0 lb

## 2023-11-01 DIAGNOSIS — N1832 Chronic kidney disease, stage 3b: Secondary | ICD-10-CM | POA: Diagnosis not present

## 2023-11-01 DIAGNOSIS — Z923 Personal history of irradiation: Secondary | ICD-10-CM | POA: Diagnosis not present

## 2023-11-01 DIAGNOSIS — E039 Hypothyroidism, unspecified: Secondary | ICD-10-CM | POA: Insufficient documentation

## 2023-11-01 DIAGNOSIS — M8580 Other specified disorders of bone density and structure, unspecified site: Secondary | ICD-10-CM

## 2023-11-01 DIAGNOSIS — C7951 Secondary malignant neoplasm of bone: Secondary | ICD-10-CM

## 2023-11-01 DIAGNOSIS — C3412 Malignant neoplasm of upper lobe, left bronchus or lung: Secondary | ICD-10-CM | POA: Diagnosis not present

## 2023-11-01 DIAGNOSIS — Z9189 Other specified personal risk factors, not elsewhere classified: Secondary | ICD-10-CM

## 2023-11-01 DIAGNOSIS — Z79620 Long term (current) use of immunosuppressive biologic: Secondary | ICD-10-CM | POA: Diagnosis not present

## 2023-11-01 DIAGNOSIS — Z905 Acquired absence of kidney: Secondary | ICD-10-CM | POA: Diagnosis not present

## 2023-11-01 DIAGNOSIS — C649 Malignant neoplasm of unspecified kidney, except renal pelvis: Secondary | ICD-10-CM

## 2023-11-01 DIAGNOSIS — C7801 Secondary malignant neoplasm of right lung: Secondary | ICD-10-CM | POA: Diagnosis not present

## 2023-11-01 DIAGNOSIS — Z5112 Encounter for antineoplastic immunotherapy: Secondary | ICD-10-CM | POA: Diagnosis not present

## 2023-11-01 DIAGNOSIS — C641 Malignant neoplasm of right kidney, except renal pelvis: Secondary | ICD-10-CM | POA: Diagnosis not present

## 2023-11-01 DIAGNOSIS — Z8673 Personal history of transient ischemic attack (TIA), and cerebral infarction without residual deficits: Secondary | ICD-10-CM | POA: Diagnosis not present

## 2023-11-01 DIAGNOSIS — Z853 Personal history of malignant neoplasm of breast: Secondary | ICD-10-CM | POA: Diagnosis not present

## 2023-11-01 DIAGNOSIS — C7802 Secondary malignant neoplasm of left lung: Secondary | ICD-10-CM | POA: Diagnosis not present

## 2023-11-01 LAB — CMP (CANCER CENTER ONLY)
ALT: 10 U/L (ref 0–44)
AST: 16 U/L (ref 15–41)
Albumin: 4 g/dL (ref 3.5–5.0)
Alkaline Phosphatase: 55 U/L (ref 38–126)
Anion gap: 8 (ref 5–15)
BUN: 35 mg/dL — ABNORMAL HIGH (ref 8–23)
CO2: 26 mmol/L (ref 22–32)
Calcium: 8.8 mg/dL — ABNORMAL LOW (ref 8.9–10.3)
Chloride: 104 mmol/L (ref 98–111)
Creatinine: 1.51 mg/dL — ABNORMAL HIGH (ref 0.44–1.00)
GFR, Estimated: 34 mL/min — ABNORMAL LOW (ref >60)
Glucose, Bld: 95 mg/dL (ref 70–99)
Potassium: 4.5 mmol/L (ref 3.5–5.1)
Sodium: 138 mmol/L (ref 135–145)
Total Bilirubin: 0.4 mg/dL (ref 0.0–1.2)
Total Protein: 7.3 g/dL (ref 6.5–8.1)

## 2023-11-01 LAB — CBC WITH DIFFERENTIAL (CANCER CENTER ONLY)
Abs Immature Granulocytes: 0.01 10*3/uL (ref 0.00–0.07)
Basophils Absolute: 0 10*3/uL (ref 0.0–0.1)
Basophils Relative: 1 %
Eosinophils Absolute: 0.1 10*3/uL (ref 0.0–0.5)
Eosinophils Relative: 2 %
HCT: 38.9 % (ref 36.0–46.0)
Hemoglobin: 12.4 g/dL (ref 12.0–15.0)
Immature Granulocytes: 0 %
Lymphocytes Relative: 38 %
Lymphs Abs: 1.9 10*3/uL (ref 0.7–4.0)
MCH: 27.8 pg (ref 26.0–34.0)
MCHC: 31.9 g/dL (ref 30.0–36.0)
MCV: 87.2 fL (ref 80.0–100.0)
Monocytes Absolute: 0.7 10*3/uL (ref 0.1–1.0)
Monocytes Relative: 13 %
Neutro Abs: 2.3 10*3/uL (ref 1.7–7.7)
Neutrophils Relative %: 46 %
Platelet Count: 192 10*3/uL (ref 150–400)
RBC: 4.46 MIL/uL (ref 3.87–5.11)
RDW: 15.5 % (ref 11.5–15.5)
WBC Count: 5.1 10*3/uL (ref 4.0–10.5)
nRBC: 0 % (ref 0.0–0.2)

## 2023-11-01 LAB — TOTAL PROTEIN, URINE DIPSTICK: Protein, ur: NEGATIVE mg/dL

## 2023-11-01 LAB — LACTATE DEHYDROGENASE: LDH: 196 U/L — ABNORMAL HIGH (ref 98–192)

## 2023-11-01 LAB — T4, FREE: Free T4: 0.49 ng/dL — ABNORMAL LOW (ref 0.61–1.12)

## 2023-11-01 MED ORDER — SODIUM CHLORIDE 0.9% FLUSH
10.0000 mL | Freq: Once | INTRAVENOUS | Status: AC
  Administered 2023-11-01: 10 mL

## 2023-11-01 MED ORDER — SODIUM CHLORIDE 0.9 % IV SOLN: INTRAVENOUS | Status: DC

## 2023-11-01 MED ORDER — SODIUM CHLORIDE 0.9 % IV SOLN
200.0000 mg | Freq: Once | INTRAVENOUS | Status: AC
  Administered 2023-11-01: 200 mg via INTRAVENOUS
  Filled 2023-11-01: qty 200

## 2023-11-01 NOTE — Patient Instructions (Signed)

## 2023-11-01 NOTE — Assessment & Plan Note (Addendum)
 Continue levothyroxine  Repeat fT4

## 2023-11-02 ENCOUNTER — Other Ambulatory Visit: Payer: Self-pay

## 2023-11-12 ENCOUNTER — Other Ambulatory Visit: Payer: Self-pay

## 2023-11-21 ENCOUNTER — Other Ambulatory Visit

## 2023-11-21 ENCOUNTER — Ambulatory Visit

## 2023-11-21 NOTE — Assessment & Plan Note (Addendum)
 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

## 2023-11-21 NOTE — Assessment & Plan Note (Signed)
 Drug monitoring for lenvatinib . Monitor for hypertension Baseline EKG normal QTc LFT, renal function, electrolytes, every 2 weeks for 2 months and then monthly thereafter Baseline TSH and free T4 and then monthly urine dipstick; if 2+ then obtain a 24-hour urine protein Monitor for signs of bleeding, wound healing, thrombosis Monitor for irAEs Will postpone zometa  to next visit.

## 2023-11-21 NOTE — Progress Notes (Unsigned)
 [facility] OFFICE PROGRESS NOTE  Patient Care Team: Duanne Butler DASEN, MD as PCP - General (Family Medicine)  83 y.o.w. 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   Diagnosis: stage IV clear cell RCC Treatment: Right nephrectomy. 04/26/23 started pembrolizumab . Delayed started of Lenvatinib  at 10 mg daily due to wound on 05/07/23.   Complete 5 cycles of pembro with very good response. Overall improving and more functioning. Now ambulating by herself without wheelchair anymore!    Now completed Cycle 9 and continue on lenvatinib .    She developed hypothyroidism. Started replacement since last visit.  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    Diane JAYSON Chihuahua, MD  INTERVAL HISTORY: Patient returns for follow-up.  Oncology History  History of breast cancer  06/11/2011 Surgery   Right breast mastectomy with right axillary sentinel lymph node biopsy 1.3 cm invasive lobular cancer, grade 1, atypical lobular hyperplasia, focal LV I, ER 91%, PR 25%, HER-2 negative, 0/8 lymph nodes T1 cN0 stage IA   07/25/2011 -  Anti-estrogen oral therapy   Letrozole  2.5 mg daily 7 years was a plan   Metastatic renal cell carcinoma to bone (HCC)  02/01/2023 Initial Diagnosis   Renal cell carcinoma (HCC)   02/05/2023 Imaging   Bone scan: No evidence of osseous metastatic disease.    04/03/2023 Pathology Results   A. KIDNEY, RIGHT, RADICAL NEPHRECTOMY:  Clear cell renal cell carcinoma  with rhabdoid, sarcomatoid Features and  tumor necrosis, nuclear grade 4, size 12.0 cm  Tumor extends into perinephric, renal sinus fat and segmental branches  of renal vein (pT3a)  Ureteral, vascular and all margins of resection are negative for tumor    Benign adrenal gland    04/23/2023 - 04/23/2023 Chemotherapy   Patient is on Treatment Plan : RENAL CELL Pembrolizumab  (200) + Axitinib q21d     04/25/2023 -  Chemotherapy   Patient is on Treatment Plan : Clear Cell RCC Pembrolizumab  (200) q21d     08/02/2023 Imaging   CT CAP  Improving mediastinal lymph nodes as well as multiple bilateral lung nodules. There are several residual lung nodules identified.   Known bone metastases   No developing new mass lesion or nodal enlargement.   Gallbladder full of stones and is distended, similar to previous.   Pelvic prolapse identified of the anterior posterior compartments.   Metallic foci along the lumen of the cecum are linear and have appearance of clips. Please correlate for ingested material or intervention.   08/02/2023 Imaging   MRI Brain 1. Negative for brain metastasis. 2. Newly enhancing 9 mm lesion in the high left parietal bone, possible early bony metastasis   NSCLC of left lung (HCC)  03/18/2023 Initial Diagnosis   NSCLC of left lung (HCC)   06/07/2023 Cancer Staging   Staging form: Lung, AJCC 8th Edition - Clinical: cT1, cN1, cM0 - Signed by Paul Diane JAYSON, MD on 06/07/2023 Stage prefix: Initial diagnosis Histologic grade (G): GX Histologic grading system: 4 grade system  PHYSICAL EXAMINATION: ECOG PERFORMANCE STATUS: {CHL ONC ECOG PS:[id number]}  There were no vitals filed for this visit. There were no vitals filed for this visit.  GENERAL: alert, no distress and comfortable SKIN: skin color normal and no bruising or petechiae or jaundice on exposed skin EYES: normal, sclera clear OROPHARYNX: no exudate  NECK: No palpable mass LYMPH:  no  palpable cervical, axillary lymphadenopathy  LUNGS: clear to auscultation and percussion with normal breathing effort HEART: regular rate & rhythm  ABDOMEN: abdomen soft, non-tender and nondistended. Musculoskeletal: no edema NEURO: no focal motor/sensory deficits  Relevant data reviewed during this visit included ***

## 2023-11-21 NOTE — Assessment & Plan Note (Signed)
 Continue levothyroxine  Repeat fT4

## 2023-11-22 ENCOUNTER — Inpatient Hospital Stay

## 2023-11-22 ENCOUNTER — Other Ambulatory Visit: Payer: Self-pay

## 2023-11-22 ENCOUNTER — Ambulatory Visit

## 2023-11-22 VITALS — BP 151/98 | HR 78 | Temp 97.7°F | Resp 16 | Ht 67.0 in | Wt 191.0 lb

## 2023-11-22 DIAGNOSIS — C7951 Secondary malignant neoplasm of bone: Secondary | ICD-10-CM | POA: Diagnosis not present

## 2023-11-22 DIAGNOSIS — Z8673 Personal history of transient ischemic attack (TIA), and cerebral infarction without residual deficits: Secondary | ICD-10-CM | POA: Insufficient documentation

## 2023-11-22 DIAGNOSIS — Z9189 Other specified personal risk factors, not elsewhere classified: Secondary | ICD-10-CM | POA: Diagnosis not present

## 2023-11-22 DIAGNOSIS — Z853 Personal history of malignant neoplasm of breast: Secondary | ICD-10-CM | POA: Insufficient documentation

## 2023-11-22 DIAGNOSIS — C7802 Secondary malignant neoplasm of left lung: Secondary | ICD-10-CM | POA: Insufficient documentation

## 2023-11-22 DIAGNOSIS — N1832 Chronic kidney disease, stage 3b: Secondary | ICD-10-CM | POA: Diagnosis not present

## 2023-11-22 DIAGNOSIS — Z905 Acquired absence of kidney: Secondary | ICD-10-CM | POA: Insufficient documentation

## 2023-11-22 DIAGNOSIS — E039 Hypothyroidism, unspecified: Secondary | ICD-10-CM | POA: Diagnosis not present

## 2023-11-22 DIAGNOSIS — I159 Secondary hypertension, unspecified: Secondary | ICD-10-CM

## 2023-11-22 DIAGNOSIS — Z5112 Encounter for antineoplastic immunotherapy: Secondary | ICD-10-CM | POA: Diagnosis not present

## 2023-11-22 DIAGNOSIS — C3412 Malignant neoplasm of upper lobe, left bronchus or lung: Secondary | ICD-10-CM | POA: Diagnosis not present

## 2023-11-22 DIAGNOSIS — C649 Malignant neoplasm of unspecified kidney, except renal pelvis: Secondary | ICD-10-CM

## 2023-11-22 DIAGNOSIS — C7801 Secondary malignant neoplasm of right lung: Secondary | ICD-10-CM | POA: Diagnosis not present

## 2023-11-22 DIAGNOSIS — M8580 Other specified disorders of bone density and structure, unspecified site: Secondary | ICD-10-CM

## 2023-11-22 DIAGNOSIS — C641 Malignant neoplasm of right kidney, except renal pelvis: Secondary | ICD-10-CM | POA: Diagnosis not present

## 2023-11-22 DIAGNOSIS — Z923 Personal history of irradiation: Secondary | ICD-10-CM | POA: Diagnosis not present

## 2023-11-22 DIAGNOSIS — Z79620 Long term (current) use of immunosuppressive biologic: Secondary | ICD-10-CM | POA: Diagnosis not present

## 2023-11-22 LAB — CBC WITH DIFFERENTIAL (CANCER CENTER ONLY)
Abs Immature Granulocytes: 0.02 K/uL (ref 0.00–0.07)
Basophils Absolute: 0 K/uL (ref 0.0–0.1)
Basophils Relative: 1 %
Eosinophils Absolute: 0.1 K/uL (ref 0.0–0.5)
Eosinophils Relative: 2 %
HCT: 36.8 % (ref 36.0–46.0)
Hemoglobin: 11.2 g/dL — ABNORMAL LOW (ref 12.0–15.0)
Immature Granulocytes: 0 %
Lymphocytes Relative: 24 %
Lymphs Abs: 1.5 K/uL (ref 0.7–4.0)
MCH: 27.4 pg (ref 26.0–34.0)
MCHC: 30.4 g/dL (ref 30.0–36.0)
MCV: 90 fL (ref 80.0–100.0)
Monocytes Absolute: 0.7 K/uL (ref 0.1–1.0)
Monocytes Relative: 11 %
Neutro Abs: 3.9 K/uL (ref 1.7–7.7)
Neutrophils Relative %: 62 %
Platelet Count: 204 K/uL (ref 150–400)
RBC: 4.09 MIL/uL (ref 3.87–5.11)
RDW: 15.1 % (ref 11.5–15.5)
WBC Count: 6.3 K/uL (ref 4.0–10.5)
nRBC: 0 % (ref 0.0–0.2)

## 2023-11-22 LAB — CMP (CANCER CENTER ONLY)
ALT: 10 U/L (ref 0–44)
AST: 15 U/L (ref 15–41)
Albumin: 3.8 g/dL (ref 3.5–5.0)
Alkaline Phosphatase: 52 U/L (ref 38–126)
Anion gap: 6 (ref 5–15)
BUN: 31 mg/dL — ABNORMAL HIGH (ref 8–23)
CO2: 28 mmol/L (ref 22–32)
Calcium: 8.6 mg/dL — ABNORMAL LOW (ref 8.9–10.3)
Chloride: 105 mmol/L (ref 98–111)
Creatinine: 1.42 mg/dL — ABNORMAL HIGH (ref 0.44–1.00)
GFR, Estimated: 37 mL/min — ABNORMAL LOW (ref >60)
Glucose, Bld: 108 mg/dL — ABNORMAL HIGH (ref 70–99)
Potassium: 4.2 mmol/L (ref 3.5–5.1)
Sodium: 139 mmol/L (ref 135–145)
Total Bilirubin: 0.3 mg/dL (ref 0.0–1.2)
Total Protein: 7.1 g/dL (ref 6.5–8.1)

## 2023-11-22 LAB — LACTATE DEHYDROGENASE: LDH: 188 U/L (ref 98–192)

## 2023-11-22 LAB — TOTAL PROTEIN, URINE DIPSTICK: Protein, ur: NEGATIVE mg/dL

## 2023-11-22 LAB — T4, FREE: Free T4: 0.61 ng/dL (ref 0.61–1.12)

## 2023-11-22 MED ORDER — HEPARIN SOD (PORK) LOCK FLUSH 100 UNIT/ML IV SOLN
500.0000 [IU] | Freq: Once | INTRAVENOUS | Status: AC | PRN
Start: 2023-11-22 — End: 2023-11-22
  Administered 2023-11-22: 500 [IU]

## 2023-11-22 MED ORDER — SODIUM CHLORIDE 0.9 % IV SOLN: INTRAVENOUS | Status: DC

## 2023-11-22 MED ORDER — SODIUM CHLORIDE 0.9 % IV SOLN
200.0000 mg | Freq: Once | INTRAVENOUS | Status: AC
  Administered 2023-11-22: 200 mg via INTRAVENOUS
  Filled 2023-11-22: qty 200

## 2023-11-22 MED ORDER — SODIUM CHLORIDE 0.9% FLUSH
10.0000 mL | Freq: Once | INTRAVENOUS | Status: AC
Start: 2023-11-22 — End: 2023-11-22
  Administered 2023-11-22: 10 mL

## 2023-11-22 MED ORDER — SODIUM CHLORIDE 0.9% FLUSH
10.0000 mL | INTRAVENOUS | Status: DC | PRN
  Administered 2023-11-22: 10 mL

## 2023-11-22 NOTE — Assessment & Plan Note (Addendum)
<  140 SBP at home. Will hold amlodipine  for now.  Continue to monitor blood pressure at home.

## 2023-11-25 ENCOUNTER — Other Ambulatory Visit: Payer: Self-pay

## 2023-11-25 DIAGNOSIS — C641 Malignant neoplasm of right kidney, except renal pelvis: Secondary | ICD-10-CM

## 2023-11-25 MED ORDER — LENVATINIB (10 MG DAILY DOSE) 10 MG PO CPPK
10.0000 mg | ORAL_CAPSULE | Freq: Every day | ORAL | 0 refills | Status: DC
  Filled 2023-11-26: qty 30, 30d supply, fill #0

## 2023-11-25 NOTE — Progress Notes (Signed)
 Specialty Pharmacy Refill Coordination Note  Diane Paul is a 83 y.o. female contacted today regarding refills of specialty medication(s) Lenvatinib  Mesylate (LENVIMA )   Patient requested Delivery   Delivery date: 11/27/23   Verified address: [address]  [place]   Medication will be filled on 11/26/23, pending refill approval.   This fill date is pending response to refill request from provider. Patient is aware and if they have not received fill by intended date they must follow up with pharmacy.

## 2023-11-26 ENCOUNTER — Other Ambulatory Visit: Payer: Self-pay

## 2023-11-28 ENCOUNTER — Other Ambulatory Visit (HOSPITAL_COMMUNITY)

## 2023-11-29 ENCOUNTER — Other Ambulatory Visit: Payer: Self-pay

## 2023-11-29 ENCOUNTER — Ambulatory Visit (HOSPITAL_COMMUNITY)

## 2023-11-29 ENCOUNTER — Ambulatory Visit (HOSPITAL_COMMUNITY)
Admission: RE | Admit: 2023-11-29 | Discharge: 2023-11-29 | Disposition: A | Discharge status: Home / Self Care | Source: Ambulatory Visit

## 2023-11-29 DIAGNOSIS — C7951 Secondary malignant neoplasm of bone: Secondary | ICD-10-CM | POA: Diagnosis not present

## 2023-11-29 DIAGNOSIS — C7800 Secondary malignant neoplasm of unspecified lung: Secondary | ICD-10-CM | POA: Diagnosis not present

## 2023-11-29 DIAGNOSIS — C649 Malignant neoplasm of unspecified kidney, except renal pelvis: Secondary | ICD-10-CM

## 2023-11-29 DIAGNOSIS — G319 Degenerative disease of nervous system, unspecified: Secondary | ICD-10-CM | POA: Diagnosis not present

## 2023-11-29 DIAGNOSIS — R9082 White matter disease, unspecified: Secondary | ICD-10-CM | POA: Diagnosis not present

## 2023-11-29 DIAGNOSIS — C801 Malignant (primary) neoplasm, unspecified: Secondary | ICD-10-CM | POA: Diagnosis not present

## 2023-11-29 DIAGNOSIS — Z905 Acquired absence of kidney: Secondary | ICD-10-CM | POA: Diagnosis not present

## 2023-11-29 MED ORDER — GADOBUTROL 1 MMOL/ML IV SOLN
7.0000 mL | Freq: Once | INTRAVENOUS | Status: AC | PRN
  Administered 2023-11-29: 7 mL via INTRAVENOUS

## 2023-11-29 MED ORDER — IOHEXOL 300 MG/ML  SOLN
100.0000 mL | Freq: Once | INTRAMUSCULAR | Status: AC | PRN
  Administered 2023-11-29: 80 mL via INTRAVENOUS

## 2023-12-02 ENCOUNTER — Other Ambulatory Visit: Payer: Self-pay

## 2023-12-02 DIAGNOSIS — E039 Hypothyroidism, unspecified: Secondary | ICD-10-CM

## 2023-12-02 NOTE — Progress Notes (Signed)
 Specialty Pharmacy Ongoing Clinical Assessment Note  Diane Paul is a 83 y.o. female who is being followed by the specialty pharmacy service for RxSp Oncology   Patient's specialty medication(s) reviewed today: Lenvatinib  Mesylate (LENVIMA )   Missed doses in the last 4 weeks: 0   Patient/Caregiver did not have any additional questions or concerns.   Therapeutic benefit summary: Patient is achieving benefit   Adverse events/side effects summary: No adverse events/side effects   Patient's therapy is appropriate to: Continue    Goals Addressed             This Visit's Progress    Maintain optimal adherence to therapy   On track    Patient is on track. Patient will maintain adherence          Follow up: 3 months  [name] M Latoyna Hird Specialty Pharmacist

## 2023-12-02 NOTE — Assessment & Plan Note (Deleted)
 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

## 2023-12-02 NOTE — Progress Notes (Deleted)
 [facility] OFFICE PROGRESS NOTE  Patient Care Team: Duanne Butler DASEN, MD as PCP - General (Family Medicine)  83 y.o.w. 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   Diagnosis: stage IV clear cell RCC Treatment: Right nephrectomy. 04/26/23 started pembrolizumab . Delayed started of Lenvatinib  at 10 mg daily due to wound on 05/07/23.   Complete 5 cycles of pembro with very good response. Overall improving and more functioning. Now ambulating by herself without wheelchair anymore!    Now completed Cycle 9 and continue on lenvatinib .    She developed hypothyroidism.  Will continue to monitor and adjust as needed.   Restaging imaging planned for next Friday.  Will follow-up with me 7/29 to discuss results. Assessment & Plan   No orders of the defined types were placed in this encounter.    Pauletta JAYSON Chihuahua, MD  INTERVAL HISTORY: Patient returns for follow-up.  Oncology History  History of breast cancer  06/11/2011 Surgery   Right breast mastectomy with right axillary sentinel lymph node biopsy 1.3 cm invasive lobular cancer, grade 1, atypical lobular hyperplasia, focal LV I, ER 91%, PR 25%, HER-2 negative, 0/8 lymph nodes T1 cN0 stage IA   07/25/2011 -  Anti-estrogen oral therapy   Letrozole  2.5 mg daily 7 years was a plan   Metastatic renal cell carcinoma to bone (HCC)  02/01/2023 Initial Diagnosis   Renal cell carcinoma (HCC)   02/05/2023 Imaging   Bone scan: No evidence of osseous metastatic disease.    04/03/2023 Pathology Results   A. KIDNEY, RIGHT, RADICAL NEPHRECTOMY:  Clear cell renal cell carcinoma with rhabdoid, sarcomatoid Features and  tumor necrosis, nuclear grade 4, size 12.0 cm  Tumor extends into perinephric, renal sinus fat and segmental branches  of renal vein (pT3a)  Ureteral, vascular and all margins of resection are  negative for tumor    Benign adrenal gland    04/23/2023 - 04/23/2023 Chemotherapy   Patient is on Treatment Plan : RENAL CELL Pembrolizumab  (200) + Axitinib q21d     04/25/2023 -  Chemotherapy   Patient is on Treatment Plan : Clear Cell RCC Pembrolizumab  (200) q21d     08/02/2023 Imaging   CT CAP  Improving mediastinal lymph nodes as well as multiple bilateral lung nodules. There are several residual lung nodules identified.   Known bone metastases   No developing new mass lesion or nodal enlargement.   Gallbladder full of stones and is distended, similar to previous.   Pelvic prolapse identified of the anterior posterior compartments.   Metallic foci along the lumen of the cecum are linear and have appearance of clips. Please correlate for ingested material or intervention.   08/02/2023 Imaging   MRI Brain 1. Negative for brain metastasis. 2. Newly enhancing 9 mm lesion in the high left parietal bone, possible early bony metastasis   NSCLC of left lung (HCC)  03/18/2023 Initial Diagnosis   NSCLC of left lung (HCC)   06/07/2023 Cancer Staging   Staging form: Lung, AJCC 8th Edition - Clinical: cT1, cN1, cM0 - Signed by Chihuahua Pauletta JAYSON, MD on 06/07/2023 Stage prefix: Initial diagnosis Histologic grade (G): GX Histologic grading system: 4 grade system      PHYSICAL EXAMINATION: ECOG PERFORMANCE STATUS: {CHL ONC ECOG PS:[id number]}  There were no vitals filed for this visit. There were no vitals filed for this visit.  Relevant data reviewed during this visit included ***

## 2023-12-03 ENCOUNTER — Inpatient Hospital Stay

## 2023-12-03 DIAGNOSIS — C649 Malignant neoplasm of unspecified kidney, except renal pelvis: Secondary | ICD-10-CM

## 2023-12-04 NOTE — Assessment & Plan Note (Addendum)
 Continue levothyroxine . Refill 50 mcg today. Continue to monitor fT4

## 2023-12-04 NOTE — Assessment & Plan Note (Addendum)
 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 

## 2023-12-04 NOTE — Assessment & Plan Note (Addendum)
 Drug monitoring for lenvatinib . Monitor for hypertension Baseline EKG normal QTc LFT, renal function, electrolytes, every 2 weeks for 2 months and then monthly thereafter Baseline TSH and free T4 and then monthly urine dipstick; if 2+ then obtain a 24-hour urine protein Monitor for signs of bleeding, wound healing, thrombosis Monitor for irAEs Last zometa  on 6/5. Please add to infusion on 8/28

## 2023-12-04 NOTE — Progress Notes (Unsigned)
 [facility] OFFICE PROGRESS NOTE  Patient Care Team: Duanne Butler DASEN, MD as PCP - General (Family Medicine)  83 y.o.w. 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   Diagnosis: stage IV clear cell RCC Treatment: Right nephrectomy. 04/26/23 started pembrolizumab . Delayed started of Lenvatinib  at 10 mg daily due to wound on 05/07/23.   New imaging showed stable disease. Discussed with patient and son.   She developed hypothyroidism.  Will continue to monitor and adjust as needed. Refilled levothyroxine  today.   Return as scheduled next week to continue treatment. 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 Refill 50 mcg today. Continue to monitor fT4 At risk for side effect of medication Drug monitoring for lenvatinib . Monitor for hypertension Baseline EKG normal QTc LFT, renal function, electrolytes, every 2 weeks for 2 months and then monthly thereafter Baseline TSH and free T4 and then monthly urine dipstick; if 2+ then obtain a 24-hour urine protein Monitor for signs of bleeding, wound healing, thrombosis Monitor for irAEs Last zometa  on 6/5. Please add to infusion on 8/28   Pauletta JAYSON Chihuahua, MD  INTERVAL HISTORY: Patient returns for follow-up.  Overall feeling well.  No side effects.  She is here to discuss results of CT scan and MRI.  Oncology History  History of breast cancer  06/11/2011 Surgery   Right breast mastectomy with right axillary sentinel lymph node biopsy 1.3 cm invasive lobular cancer, grade 1, atypical lobular hyperplasia,  focal LV I, ER 91%, PR 25%, HER-2 negative, 0/8 lymph nodes T1 cN0 stage IA   07/25/2011 -  Anti-estrogen oral therapy   Letrozole  2.5 mg daily 7 years was a plan   Metastatic renal cell carcinoma to bone (HCC)  02/01/2023 Initial Diagnosis   Renal cell carcinoma (HCC)   02/05/2023 Imaging   Bone scan: No evidence of osseous metastatic disease.    04/03/2023 Pathology Results   A. 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 There are several residual lung nodules identified.   Known bone metastases   No developing new mass lesion or nodal enlargement.   Gallbladder full of stones and is distended, similar to previous.   Pelvic prolapse identified of the anterior posterior compartments.   Metallic foci along the lumen of the cecum are linear and have appearance of clips. Please correlate for ingested material or intervention.   08/02/2023 Imaging   MRI Brain 1. Negative for brain metastasis. 2. Newly enhancing 9  mm lesion in the high left parietal bone, possible early bony metastasis   11/29/2023 Imaging   CT CAP IMPRESSION: 1. Status post right nephrectomy and adrenalectomy. No suspicious soft tissue or contrast enhancement in the nephrectomy bed. 2. Unchanged pulmonary metastases. 3. Unchanged osseous metastases. 4. No evidence of new  metastatic disease in the chest, abdomen, or pelvis. 5. Cholelithiasis.  Lungs/Pleura:  Unchanged nodule of the posterior right pulmonary apex measuring 1.8 x 1.3 cm (series 4, image 27).  Unchanged nodule of the paramedian right upper lobe measuring 1.0 x 0.6 cm (series 4, image 29).  Unchanged nodule of the dependent left lower lobe measuring 0.4 cm (series 4, image 70).  Unchanged subpleural nodule of the dependent left lower lobe measuring 1.2 x 0.6 cm (series 4, image 87). No pleural effusion or pneumothorax.   Unchanged mixed lytic and sclerotic metastasis of T3 (series 6, image 105). Callused fracture deformity of the lateral right eighth rib with interval healing (series 2, image 39). Unchanged small rim sclerotic metastasis of S1 (series 6, image 105) Unchanged faintly lytic metastasis of the left ilium (series 2, image 89).   11/29/2023 Imaging   CT: stable disease MRI brain: no new disease.   NSCLC of left lung (HCC)  03/18/2023 Initial Diagnosis   NSCLC of left lung (HCC)   06/07/2023 Cancer Staging   Staging form: Lung, AJCC 8th Edition - Clinical: cT1, cN1, cM0 - Signed by Tina Pauletta BROCKS, MD on 06/07/2023 Stage prefix: Initial diagnosis Histologic grade (G): GX Histologic grading system: 4 grade system      PHYSICAL EXAMINATION: ECOG PERFORMANCE STATUS: 1 - Symptomatic but completely ambulatory  Vitals:   12/05/23 1053 12/05/23 1101  BP: (!) 175/95 (!) 164/89  Pulse: 70   Resp: 18   Temp: (!) 97.2 F (36.2 C)   SpO2: 98%    Filed Weights   12/05/23 1053  Weight: 192 lb 9.6 oz (87.4 kg)   Patient is alert and in no distress.  Relevant data reviewed during this visit included imaging and recent labs.

## 2023-12-05 ENCOUNTER — Inpatient Hospital Stay (HOSPITAL_BASED_OUTPATIENT_CLINIC_OR_DEPARTMENT_OTHER)

## 2023-12-05 VITALS — BP 164/89 | HR 70 | Temp 97.2°F | Resp 18 | Wt 192.6 lb

## 2023-12-05 DIAGNOSIS — Z9189 Other specified personal risk factors, not elsewhere classified: Secondary | ICD-10-CM

## 2023-12-05 DIAGNOSIS — C641 Malignant neoplasm of right kidney, except renal pelvis: Secondary | ICD-10-CM | POA: Diagnosis not present

## 2023-12-05 DIAGNOSIS — Z905 Acquired absence of kidney: Secondary | ICD-10-CM | POA: Diagnosis not present

## 2023-12-05 DIAGNOSIS — C7951 Secondary malignant neoplasm of bone: Secondary | ICD-10-CM | POA: Diagnosis not present

## 2023-12-05 DIAGNOSIS — N1832 Chronic kidney disease, stage 3b: Secondary | ICD-10-CM | POA: Diagnosis not present

## 2023-12-05 DIAGNOSIS — Z79620 Long term (current) use of immunosuppressive biologic: Secondary | ICD-10-CM | POA: Diagnosis not present

## 2023-12-05 DIAGNOSIS — C7801 Secondary malignant neoplasm of right lung: Secondary | ICD-10-CM | POA: Diagnosis not present

## 2023-12-05 DIAGNOSIS — Z8673 Personal history of transient ischemic attack (TIA), and cerebral infarction without residual deficits: Secondary | ICD-10-CM | POA: Diagnosis not present

## 2023-12-05 DIAGNOSIS — Z5112 Encounter for antineoplastic immunotherapy: Secondary | ICD-10-CM | POA: Diagnosis not present

## 2023-12-05 DIAGNOSIS — C7802 Secondary malignant neoplasm of left lung: Secondary | ICD-10-CM | POA: Diagnosis not present

## 2023-12-05 DIAGNOSIS — C649 Malignant neoplasm of unspecified kidney, except renal pelvis: Secondary | ICD-10-CM

## 2023-12-05 DIAGNOSIS — E039 Hypothyroidism, unspecified: Secondary | ICD-10-CM

## 2023-12-05 DIAGNOSIS — Z853 Personal history of malignant neoplasm of breast: Secondary | ICD-10-CM | POA: Diagnosis not present

## 2023-12-05 DIAGNOSIS — C3412 Malignant neoplasm of upper lobe, left bronchus or lung: Secondary | ICD-10-CM | POA: Diagnosis not present

## 2023-12-05 DIAGNOSIS — Z923 Personal history of irradiation: Secondary | ICD-10-CM | POA: Diagnosis not present

## 2023-12-05 MED ORDER — LEVOTHYROXINE SODIUM 50 MCG PO TABS: 50.0000 ug | ORAL_TABLET | Freq: Every day | ORAL | 1 refills | Status: DC

## 2023-12-06 ENCOUNTER — Other Ambulatory Visit (HOSPITAL_BASED_OUTPATIENT_CLINIC_OR_DEPARTMENT_OTHER): Payer: Self-pay

## 2023-12-13 ENCOUNTER — Inpatient Hospital Stay

## 2023-12-13 ENCOUNTER — Inpatient Hospital Stay: Admitting: Physician Assistant

## 2023-12-13 VITALS — BP 155/96 | HR 79 | Temp 97.0°F | Resp 15 | Wt 195.5 lb

## 2023-12-13 DIAGNOSIS — E039 Hypothyroidism, unspecified: Secondary | ICD-10-CM | POA: Diagnosis not present

## 2023-12-13 DIAGNOSIS — Z5112 Encounter for antineoplastic immunotherapy: Secondary | ICD-10-CM | POA: Diagnosis not present

## 2023-12-13 DIAGNOSIS — C7801 Secondary malignant neoplasm of right lung: Secondary | ICD-10-CM | POA: Insufficient documentation

## 2023-12-13 DIAGNOSIS — Z79890 Hormone replacement therapy (postmenopausal): Secondary | ICD-10-CM | POA: Diagnosis not present

## 2023-12-13 DIAGNOSIS — Z8673 Personal history of transient ischemic attack (TIA), and cerebral infarction without residual deficits: Secondary | ICD-10-CM | POA: Diagnosis not present

## 2023-12-13 DIAGNOSIS — Z853 Personal history of malignant neoplasm of breast: Secondary | ICD-10-CM | POA: Diagnosis not present

## 2023-12-13 DIAGNOSIS — C641 Malignant neoplasm of right kidney, except renal pelvis: Secondary | ICD-10-CM | POA: Insufficient documentation

## 2023-12-13 DIAGNOSIS — C7802 Secondary malignant neoplasm of left lung: Secondary | ICD-10-CM

## 2023-12-13 DIAGNOSIS — M8580 Other specified disorders of bone density and structure, unspecified site: Secondary | ICD-10-CM

## 2023-12-13 DIAGNOSIS — C649 Malignant neoplasm of unspecified kidney, except renal pelvis: Secondary | ICD-10-CM

## 2023-12-13 DIAGNOSIS — Z923 Personal history of irradiation: Secondary | ICD-10-CM | POA: Insufficient documentation

## 2023-12-13 DIAGNOSIS — C7951 Secondary malignant neoplasm of bone: Secondary | ICD-10-CM | POA: Diagnosis not present

## 2023-12-13 DIAGNOSIS — N1832 Chronic kidney disease, stage 3b: Secondary | ICD-10-CM | POA: Insufficient documentation

## 2023-12-13 DIAGNOSIS — C3412 Malignant neoplasm of upper lobe, left bronchus or lung: Secondary | ICD-10-CM | POA: Insufficient documentation

## 2023-12-13 DIAGNOSIS — Z905 Acquired absence of kidney: Secondary | ICD-10-CM | POA: Diagnosis not present

## 2023-12-13 DIAGNOSIS — Z79620 Long term (current) use of immunosuppressive biologic: Secondary | ICD-10-CM | POA: Insufficient documentation

## 2023-12-13 LAB — CBC WITH DIFFERENTIAL (CANCER CENTER ONLY)
Abs Immature Granulocytes: 0.01 K/uL (ref 0.00–0.07)
Basophils Absolute: 0 K/uL (ref 0.0–0.1)
Basophils Relative: 1 %
Eosinophils Absolute: 0.2 K/uL (ref 0.0–0.5)
Eosinophils Relative: 3 %
HCT: 37.3 % (ref 36.0–46.0)
Hemoglobin: 11.7 g/dL — ABNORMAL LOW (ref 12.0–15.0)
Immature Granulocytes: 0 %
Lymphocytes Relative: 26 %
Lymphs Abs: 1.5 K/uL (ref 0.7–4.0)
MCH: 28.1 pg (ref 26.0–34.0)
MCHC: 31.4 g/dL (ref 30.0–36.0)
MCV: 89.4 fL (ref 80.0–100.0)
Monocytes Absolute: 0.7 K/uL (ref 0.1–1.0)
Monocytes Relative: 12 %
Neutro Abs: 3.4 K/uL (ref 1.7–7.7)
Neutrophils Relative %: 58 %
Platelet Count: 224 K/uL (ref 150–400)
RBC: 4.17 MIL/uL (ref 3.87–5.11)
RDW: 14.3 % (ref 11.5–15.5)
WBC Count: 5.8 K/uL (ref 4.0–10.5)
nRBC: 0 % (ref 0.0–0.2)

## 2023-12-13 LAB — CMP (CANCER CENTER ONLY)
ALT: 10 U/L (ref 0–44)
AST: 16 U/L (ref 15–41)
Albumin: 3.9 g/dL (ref 3.5–5.0)
Alkaline Phosphatase: 49 U/L (ref 38–126)
Anion gap: 4 — ABNORMAL LOW (ref 5–15)
BUN: 33 mg/dL — ABNORMAL HIGH (ref 8–23)
CO2: 30 mmol/L (ref 22–32)
Calcium: 8.9 mg/dL (ref 8.9–10.3)
Chloride: 104 mmol/L (ref 98–111)
Creatinine: 1.5 mg/dL — ABNORMAL HIGH (ref 0.44–1.00)
GFR, Estimated: 35 mL/min — ABNORMAL LOW (ref >60)
Glucose, Bld: 98 mg/dL (ref 70–99)
Potassium: 4.6 mmol/L (ref 3.5–5.1)
Sodium: 138 mmol/L (ref 135–145)
Total Bilirubin: 0.3 mg/dL (ref 0.0–1.2)
Total Protein: 7.2 g/dL (ref 6.5–8.1)

## 2023-12-13 LAB — TOTAL PROTEIN, URINE DIPSTICK: Protein, ur: NEGATIVE mg/dL

## 2023-12-13 LAB — T4, FREE: Free T4: 0.63 ng/dL (ref 0.61–1.12)

## 2023-12-13 LAB — TSH: TSH: 63.1 u[IU]/mL — ABNORMAL HIGH (ref 0.350–4.500)

## 2023-12-13 LAB — LACTATE DEHYDROGENASE: LDH: 192 U/L (ref 98–192)

## 2023-12-13 MED ORDER — SODIUM CHLORIDE 0.9 % IV SOLN: INTRAVENOUS | Status: DC

## 2023-12-13 MED ORDER — SODIUM CHLORIDE 0.9% FLUSH
10.0000 mL | Freq: Once | INTRAVENOUS | Status: AC | PRN
Start: 2023-12-13 — End: 2023-12-13
  Administered 2023-12-13: 10 mL

## 2023-12-13 MED ORDER — SODIUM CHLORIDE 0.9 % IV SOLN
200.0000 mg | Freq: Once | INTRAVENOUS | Status: AC
  Administered 2023-12-13: 200 mg via INTRAVENOUS
  Filled 2023-12-13: qty 200

## 2023-12-13 NOTE — Progress Notes (Signed)
 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   Oncology History  History of breast cancer  06/11/2011 Surgery   Right breast mastectomy with right axillary sentinel lymph node biopsy 1.3 cm invasive lobular cancer, grade 1, atypical lobular hyperplasia, focal LV I, ER 91%, PR 25%, HER-2 negative, 0/8 lymph nodes T1 cN0 stage IA   07/25/2011 -  Anti-estrogen oral therapy   Letrozole  2.5 mg daily 7 years was a plan   Metastatic renal cell carcinoma to bone (HCC)  02/01/2023 Initial Diagnosis   Renal cell carcinoma (HCC)   02/05/2023 Imaging   Bone scan: No evidence of osseous metastatic disease.    04/03/2023 Pathology Results   A. 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 There are several residual lung nodules identified.   Known bone metastases   No developing new mass lesion or nodal enlargement.   Gallbladder full of stones and is distended, similar to previous.   Pelvic prolapse identified of the anterior posterior compartments.   Metallic foci along the lumen of the cecum are linear and have appearance of clips.  Please correlate for ingested material or intervention.   08/02/2023 Imaging   MRI Brain 1. Negative for brain metastasis. 2. Newly enhancing 9 mm lesion in the high left parietal bone, possible early bony metastasis   11/29/2023 Imaging   CT CAP IMPRESSION: 1. Status post right nephrectomy and adrenalectomy. No suspicious soft tissue or contrast enhancement in the nephrectomy bed. 2. Unchanged pulmonary metastases. 3. Unchanged osseous metastases. 4. No evidence of new metastatic disease in the chest, abdomen, or pelvis. 5. Cholelithiasis.  Lungs/Pleura:  Unchanged nodule of the posterior right pulmonary apex measuring 1.8 x 1.3 cm (series 4, image 27).  Unchanged nodule of the paramedian right upper lobe measuring 1.0 x 0.6 cm (series 4, image 29).  Unchanged nodule of the dependent left lower lobe measuring 0.4 cm (series 4, image 70).  Unchanged subpleural nodule of the dependent left lower lobe measuring 1.2 x 0.6 cm (series 4, image 87). No pleural effusion or pneumothorax.   Unchanged mixed lytic and sclerotic metastasis of T3 (series 6, image 105). Callused fracture deformity of the lateral right eighth rib with interval healing (series 2, image 39). Unchanged small rim sclerotic metastasis of S1 (series 6, image 105) Unchanged faintly lytic metastasis of the left ilium (series 2, image 89).   11/29/2023 Imaging   CT: stable disease MRI brain: no new disease.   NSCLC of left lung (HCC)  03/18/2023 Initial Diagnosis   NSCLC of left lung (HCC)   06/07/2023 Cancer Staging   Staging form: Lung, AJCC 8th  Edition - Clinical: cT1, cN1, cM0 - Signed by Tina Pauletta BROCKS, MD on 06/07/2023 Stage prefix: Initial diagnosis Histologic grade (G): GX Histologic grading system: 4 grade system    CURRENT TREATMENT: Pembrolizumab  and Lenvatinib .    HISTORY OF PRESENTING ILLNESS:  Diane Paul 83 y.o. female returns for follow-up prior to cycle 12, day 1 of pembrolizumab  plus  lenvatinib  therapy.  Diane Paul was seen by Dr. Tina on 12/05/2023.  In the interim she denies any changes to her health.  Diane Paul reports she is tolerating treatment without any new or concerning side effects.  Her energy levels are fairly stable and she is able to complete her baseline ADLs.  She denies any appetite or weight changes. She denies nausea, vomiting or bowel habit changes.  She denies easy bruising or signs of active bleeding.  She denies fevers, chills, night sweats, shortness of breath, chest pain or cough.  She has no other complaints.  Rest of the 10 point ROS as below.    MEDICAL HISTORY:  Past Medical History:  Diagnosis Date   Anemia    Anxiety    nervous sometimes, denies panica ttacks    Arthritis    knees ?   Blood transfusion    post vaginal birth- 1960, 01/2023- anemia   Breast cancer (HCC)    Right Breast Cancer   Cancer (HCC)    R breast cancer   Dry mouth    GERD (gastroesophageal reflux disease)    Hypertension    Neuromuscular disorder (HCC)    L leg, thigh- burns sometimes   Shortness of breath    sometimes    Use of letrozole  (Femara ) 05/07/2010   neoadjuvant femara  therapy since 1/212    SURGICAL HISTORY: Past Surgical History:  Procedure Laterality Date   ABDOMINAL HYSTERECTOMY     BIOPSY  02/03/2023   Procedure: BIOPSY;  Surgeon: Rollin Dover, MD;  Location: [facility] ENDOSCOPY;  Service: Gastroenterology;;  gastric   BREAST LUMPECTOMY     BREAST SURGERY     Right   BRONCHIAL BIOPSY  02/12/2023   Procedure: BRONCHIAL BIOPSIES;  Surgeon: Shelah Lamar RAMAN, MD;  Location: [facility] ENDOSCOPY;  Service: Pulmonary;;   BRONCHIAL BIOPSY  03/11/2023   Procedure: BRONCHIAL BIOPSIES;  Surgeon: Shelah Lamar RAMAN, MD;  Location: [facility] ENDOSCOPY;  Service: Pulmonary;;   BRONCHIAL BRUSHINGS  02/12/2023   Procedure: BRONCHIAL BRUSHINGS;  Surgeon: Shelah Lamar RAMAN, MD;  Location: [facility] ENDOSCOPY;  Service: Pulmonary;;   BRONCHIAL BRUSHINGS  03/11/2023   Procedure:  BRONCHIAL BRUSHINGS;  Surgeon: Shelah Lamar RAMAN, MD;  Location: [facility] ENDOSCOPY;  Service: Pulmonary;;   BRONCHIAL NEEDLE ASPIRATION BIOPSY  02/12/2023   Procedure: BRONCHIAL NEEDLE ASPIRATION BIOPSIES;  Surgeon: Shelah Lamar RAMAN, MD;  Location: [facility] ENDOSCOPY;  Service: Pulmonary;;   BRONCHIAL NEEDLE ASPIRATION BIOPSY  03/11/2023   Procedure: BRONCHIAL NEEDLE ASPIRATION BIOPSIES;  Surgeon: Shelah Lamar RAMAN, MD;  Location: [facility] ENDOSCOPY;  Service: Pulmonary;;   BRONCHIAL WASHINGS  02/12/2023   Procedure: BRONCHIAL WASHINGS;  Surgeon: Shelah Lamar RAMAN, MD;  Location: [facility] ENDOSCOPY;  Service: Pulmonary;;   COLONOSCOPY WITH PROPOFOL  N/A 02/03/2023   Procedure: COLONOSCOPY WITH PROPOFOL ;  Surgeon: Rollin Dover, MD;  Location: [facility] ENDOSCOPY;  Service: Gastroenterology;  Laterality: N/A;   ESOPHAGOGASTRODUODENOSCOPY (EGD) WITH PROPOFOL  N/A 02/03/2023   Procedure: ESOPHAGOGASTRODUODENOSCOPY (EGD) WITH PROPOFOL ;  Surgeon: Rollin Dover, MD;  Location: [facility] ENDOSCOPY;  Service: Gastroenterology;  Laterality: N/A;   HEMOSTASIS CLIP PLACEMENT  02/03/2023   Procedure: HEMOSTASIS CLIP PLACEMENT;  Surgeon: Rollin Dover, MD;  Location: [facility] ENDOSCOPY;  Service: Gastroenterology;;  ascending colon polyp   IR IMAGING GUIDED PORT INSERTION  04/30/2023   jp drains     s/p masectomy 06/11/11/ Dr. Gail Favorite, 2 jp drains right chest wall   MASTECTOMY Right    malignant   MASTECTOMY W/ SENTINEL NODE BIOPSY  06/11/2011/Right BReast   Procedure: MASTECTOMY WITH SENTINEL LYMPH NODE BIOPSY;  Surgeon: Favorite CHRISTELLA Gail, MD;  Location: [facility] OR;  Service: General;  Laterality: Right;  Right total mastectomy and sentinel lymph node biopsy using lymphatic mapping and blue dye injection.   POLYPECTOMY  02/03/2023   Procedure: POLYPECTOMY;  Surgeon: Rollin Dover, MD;  Location: [facility] ENDOSCOPY;  Service: Gastroenterology;;  hot snare polypectomy ascending colon   ROBOT ASSISTED LAPAROSCOPIC NEPHRECTOMY Right 04/03/2023   Procedure: XI ROBOTIC ASSISTED  RIGHT LAPAROSCOPIC RADICAL NEPHRECTOMY;  Surgeon: Alvaro Ricardo KATHEE Mickey., MD;  Location: [facility] ORS;  Service: Urology;  Laterality: Right;  180 MINUTES   SUBMUCOSAL LIFTING INJECTION  02/03/2023   Procedure: SUBMUCOSAL LIFTING INJECTION;  Surgeon: Rollin Dover, MD;  Location: [facility] ENDOSCOPY;  Service: Gastroenterology;;  ever lift 16cc ascending colon   TUBAL LIGATION     US  FINE NEEDLE ASPIRATION WO/ W SMEAR  05/22/10   Left Breast- cyst or abscess     SOCIAL HISTORY: Social History   Socioeconomic History   Marital status: Single    Spouse name: Not on file   Number of children: 5   Years of education: Not on file   Highest education level: Not on file  Occupational History   Occupation: Retired  Tobacco Use   Smoking status: Never   Smokeless tobacco: Never  Vaping Use   Vaping status: Never Used  Substance and Sexual Activity   Alcohol use: No   Drug use: No   Sexual activity: Not Currently    Birth control/protection: Post-menopausal  Other Topics Concern   Not on file  Social History Narrative   Not on file   Social Drivers of Health   Financial Resource Strain: Low Risk  (03/25/2023)   Overall Financial Resource Strain (CARDIA)    Difficulty of Paying Living Expenses: Not hard at all  Food Insecurity: No Food Insecurity (08/20/2023)   Hunger Vital Sign    Worried About Running Out of Food in the Last Year: Never true    Ran Out of Food in the Last Year: Never true  Transportation Needs: No Transportation Needs (08/20/2023)   PRAPARE - Administrator, Civil Service (Medical): No    Lack of Transportation (Non-Medical): No  Physical Activity: Sufficiently Active (07/25/2022)   Exercise Vital Sign    Days of Exercise per Week: 7 days    Minutes of Exercise per Session: 150+ min  Stress: No Stress Concern Present (07/25/2022)   [employer] of Occupational Health - Occupational Stress Questionnaire    Feeling of Stress : Not at all  Social Connections:  Socially Isolated (07/25/2022)   Social Connection and Isolation Panel    Frequency of Communication with Friends and Family: More than three times a week    Frequency of Social Gatherings with Friends and Family: More than three times a week    Attends Religious Services: Never    Database administrator or Organizations: No    Attends Banker Meetings: Never    Marital Status: Divorced  Catering manager Violence: Not At Risk (04/08/2023)   Humiliation, Afraid, Rape, and Kick questionnaire  Fear of Current or Ex-Partner: No    Emotionally Abused: No    Physically Abused: No    Sexually Abused: No    FAMILY HISTORY: Family History  Problem Relation Age of Onset   Breast cancer Sister    Anesthesia problems Neg Hx    Hypotension Neg Hx    Malignant hyperthermia Neg Hx    Pseudochol deficiency Neg Hx     ALLERGIES:  is allergic to fosamax  [alendronate  sodium].  MEDICATIONS:  Current Outpatient Medications  Medication Sig Dispense Refill   cephALEXin  (KEFLEX ) 500 MG capsule Take 1 capsule (500 mg total) by mouth 3 (three) times daily. 30 capsule 0   diphenhydrAMINE  (BENADRYL ) 25 mg capsule Take 25 mg by mouth every 6 (six) hours as needed for itching.     Ensure (ENSURE) Take 237 mLs by mouth 2 (two) times daily between meals.     lenvatinib  10 mg daily dose (LENVIMA ) capsule Take 1 capsule (10 mg total) by mouth daily. 30 capsule 0   levothyroxine  (SYNTHROID ) 50 MCG tablet Take 1 tablet (50 mcg total) by mouth daily before breakfast. 30 tablet 1   lidocaine -prilocaine  (EMLA ) cream Apply 1 Application topically as needed. Apply to port ~ 1 hour prior to access 30 g 0   ondansetron  (ZOFRAN ) 8 MG tablet Take 1 tablet (8 mg total) by mouth every 8 (eight) hours as needed for nausea or vomiting. 30 tablet 1   prochlorperazine  (COMPAZINE ) 10 MG tablet Take 1 tablet (10 mg total) by mouth every 6 (six) hours as needed for nausea or vomiting. 30 tablet 1   senna-docusate  (SENOKOT-S) 8.6-50 MG tablet Take 1 tablet by mouth 2 (two) times daily. While taking strong pain meds to prevent constipation 10 tablet 0   No current facility-administered medications for this visit.    REVIEW OF SYSTEMS:   Constitutional: ( - ) fevers, ( - )  chills , ( - ) night sweats Eyes: ( - ) blurriness of vision, ( - ) double vision, ( - ) watery eyes Ears, nose, mouth, throat, and face: ( - ) mucositis, ( - ) sore throat Respiratory: ( - ) cough, ( - ) dyspnea, ( - ) wheezes Cardiovascular: ( - ) palpitation, ( - ) chest discomfort, ( - ) lower extremity swelling Gastrointestinal:  ( - ) nausea, ( - ) heartburn, ( - ) change in bowel habits Skin: ( - ) abnormal skin rashes Lymphatics: ( - ) new lymphadenopathy, ( - ) easy bruising Neurological: ( - ) numbness, ( - ) tingling, ( - ) new weaknesses Behavioral/Psych: ( - ) mood change, ( - ) new changes  All other systems were reviewed with the patient and are negative.  PHYSICAL EXAMINATION: ECOG PERFORMANCE STATUS: 1 - Symptomatic but completely ambulatory  Vitals:   12/13/23 1048  BP: (!) 155/96  Pulse: 79  Resp: 15  Temp: (!) 97 F (36.1 C)  SpO2: 99%   Filed Weights   12/13/23 1048  Weight: 195 lb 8 oz (88.7 kg)    GENERAL: well appearing female in NAD  SKIN: skin color, texture, turgor are normal, no rashes or significant lesions EYES: conjunctiva are pink and non-injected, sclera clear OROPHARYNX: no exudate, no erythema; lips, buccal mucosa, and tongue normal  LUNGS: clear to auscultation and percussion with normal breathing effort HEART: regular rate & rhythm and no murmurs. Trace right > left ankle edema Musculoskeletal: no cyanosis of digits and no clubbing  PSYCH: alert &  oriented x 3, fluent speech NEURO: no focal motor/sensory deficits  LABORATORY DATA:  I have reviewed the data as listed    Latest Ref Rng & Units 12/13/2023   10:02 AM 11/22/2023    9:04 AM 11/01/2023    8:06 AM  CBC  WBC 4.0 -  10.5 K/uL 5.8  6.3  5.1   Hemoglobin 12.0 - 15.0 g/dL 88.2  88.7  87.5   Hematocrit 36.0 - 46.0 % 37.3  36.8  38.9   Platelets 150 - 400 K/uL 224  204  192        Latest Ref Rng & Units 11/22/2023    9:04 AM 11/01/2023    8:06 AM 10/10/2023    8:00 AM  CMP  Glucose 70 - 99 mg/dL 891  95  883   BUN 8 - 23 mg/dL 31  35  23   Creatinine 0.44 - 1.00 mg/dL 8.57  8.48  8.52   Sodium 135 - 145 mmol/L 139  138  140   Potassium 3.5 - 5.1 mmol/L 4.2  4.5  4.5   Chloride 98 - 111 mmol/L 105  104  105   CO2 22 - 32 mmol/L 28  26  29    Calcium  8.9 - 10.3 mg/dL 8.6  8.8  8.8   Total Protein 6.5 - 8.1 g/dL 7.1  7.3  7.0   Total Bilirubin 0.0 - 1.2 mg/dL 0.3  0.4  0.3   Alkaline Phos 38 - 126 U/L 52  55  55   AST 15 - 41 U/L 15  16  20    ALT 0 - 44 U/L 10  10  15      RADIOGRAPHIC STUDIES: I have personally reviewed the radiological images as listed and agreed with the findings in the report. MR Brain W Wo Contrast Result Date: 12/03/2023 CLINICAL DATA:  follow up kidney cancer, radiation to left skull lesion EXAM: MRI HEAD WITHOUT AND WITH CONTRAST TECHNIQUE: Multiplanar, multiecho pulse sequences of the brain and surrounding structures were obtained without and with intravenous contrast. CONTRAST:  7mL GADAVIST  GADOBUTROL  1 MMOL/ML IV SOLN COMPARISON:  MRI of the head dated August 02, 2023. FINDINGS: Brain: There is age related cerebral atrophy. There is mild to moderate periventricular and deep cerebral white matter disease present. There is no evidence of hemorrhage, mass, cortical infarct or hydrocephalus. There is no abnormal parenchymal or meningeal enhancement. There is no hemosiderin staining. Vascular: Normal flow voids. Skull and upper cervical spine: There is an ovoid circumscribed lesion again demonstrated within the left parietal bone along the inner table. The lesion only faintly enhances on the current exam and is otherwise unchanged. The calvaria is otherwise unremarkable. Sinuses/Orbits:  Clear paranasal sinuses. Status post bilateral lens replacement. Other: None. IMPRESSION: 1. The previously noted circumscribed lesion within the left parietal bone is now hypoenhancing but otherwise unchanged in the interim. 2. Mild to moderate cerebral white matter disease. Electronically Signed   By: Evalene Coho M.D.   On: 12/03/2023 16:50   CT CHEST ABDOMEN PELVIS W CONTRAST Result Date: 12/03/2023 CLINICAL DATA:  Metastatic renal cell carcinoma restaging * Tracking Code: BO * EXAM: CT CHEST, ABDOMEN, AND PELVIS WITH CONTRAST TECHNIQUE: Multidetector CT imaging of the chest, abdomen and pelvis was performed following the standard protocol during bolus administration of intravenous contrast. RADIATION DOSE REDUCTION: This exam was performed according to the departmental dose-optimization program which includes automated exposure control, adjustment of the mA and/or kV according to patient size and/or  use of iterative reconstruction technique. CONTRAST:  80mL OMNIPAQUE  IOHEXOL  300 MG/ML  SOLN COMPARISON:  08/02/2023 FINDINGS: CT CHEST FINDINGS Cardiovascular: Left chest port catheter. Aortic atherosclerosis. Normal heart size. No pericardial effusion. Mediastinum/Nodes: No persistently enlarged mediastinal, hilar, or axillary lymph nodes. Thyroid  gland, trachea, and esophagus demonstrate no significant findings. Lungs/Pleura: Unchanged nodule of the posterior right pulmonary apex measuring 1.8 x 1.3 cm (series 4, image 27). Unchanged nodule of the paramedian right upper lobe measuring 1.0 x 0.6 cm (series 4, image 29). Unchanged nodule of the dependent left lower lobe measuring 0.4 cm (series 4, image 70). Unchanged subpleural nodule of the dependent left lower lobe measuring 1.2 x 0.6 cm (series 4, image 87). No pleural effusion or pneumothorax. Musculoskeletal: Right mastectomy. No acute osseous findings. Unchanged mixed lytic and sclerotic metastasis of T3 (series 6, image 105). Callused fracture  deformity of the lateral right eighth rib with interval healing (series 2, image 39). CT ABDOMEN PELVIS FINDINGS Hepatobiliary: No solid liver abnormality is seen. Gallbladder is mildly distended by numerous faintly rim calcified gallstones. No gallbladder wall thickening, or biliary dilatation. Pancreas: Unremarkable. No pancreatic ductal dilatation or surrounding inflammatory changes. Spleen: Normal in size without significant abnormality. Adrenals/Urinary Tract: Status post right nephrectomy and adrenalectomy. No suspicious soft tissue or contrast enhancement in the nephrectomy bed. Left kidney and adrenal are normal, without renal calculi, solid lesion, or hydronephrosis. Bladder is unremarkable. Stomach/Bowel: Stomach is within normal limits. Appendix appears normal. No evidence of bowel wall thickening, distention, or inflammatory changes. Moderate burden of stool throughout the colon. Sigmoid diverticulosis. Vascular/Lymphatic: Aortic atherosclerosis. No enlarged abdominal or pelvic lymph nodes. Reproductive: Hysterectomy. Other: Subcutaneous fat necrosis in the right upper quadrant secondary to laparotomy. No ascites. Musculoskeletal: No acute osseous findings. Unchanged small rim sclerotic metastasis of S1 (series 6, image 105). Unchanged faintly lytic metastasis of the left ilium (series 2, image 89). IMPRESSION: 1. Status post right nephrectomy and adrenalectomy. No suspicious soft tissue or contrast enhancement in the nephrectomy bed. 2. Unchanged pulmonary metastases. 3. Unchanged osseous metastases. 4. No evidence of new metastatic disease in the chest, abdomen, or pelvis. 5. Cholelithiasis. Aortic Atherosclerosis (ICD10-I70.0). Electronically Signed   By: Marolyn JONETTA Jaksch M.D.   On: 12/03/2023 15:56    ASSESSMENT & PLAN Diane Paul is a 83 y.o. female who presents for continued management of metastatic clear-cell RCC.  #Stage IV Clear Cell RCC involving the lungs and bones: --S/p right  nephrectomy. Started pembrolizumab  on 04/26/23. Delayed started of Lenvatinib  at 10 mg daily due to wound on 05/07/23. --Most recent CT CAP from 11/29/2023 shows stable disease. PLAN: -- Due for cycle 12, day 1 of pembrolizumab  today. --Labs from today were reviewed and adequate for treatment.  WBC 5.8, hemoglobin 11.7, MCV 89.4, platelet 224.  Creatinine stable at 1.50.  LFTs normal.   --Proceed with treatment today without any dose modifications. --RTC in 3 weeks with labs and follow-up prior to cycle 13, day 1  # Acquired hypothyroidism: -- Secondary to immunotherapy, currently on levothyroxine  50 mcg daily. Thyroid  levels pending.   No orders of the defined types were placed in this encounter.   All questions were answered. The patient knows to call the clinic with any problems, questions or concerns.  I have spent a total of 30 minutes minutes of face-to-face and non-face-to-face time, preparing to see the patient,performing a medically appropriate examination, counseling and educating the patient, documenting clinical information in the electronic health record, independently interpreting results and communicating results  to the patient, and care coordination.   Johnston Police, PA-C Department of Hematology/Oncology [facility] Phone: [phone]

## 2023-12-13 NOTE — Patient Instructions (Signed)

## 2023-12-19 ENCOUNTER — Other Ambulatory Visit: Payer: Self-pay

## 2023-12-19 DIAGNOSIS — C641 Malignant neoplasm of right kidney, except renal pelvis: Secondary | ICD-10-CM

## 2023-12-19 MED ORDER — LENVATINIB (10 MG DAILY DOSE) 10 MG PO CPPK
10.0000 mg | ORAL_CAPSULE | Freq: Every day | ORAL | 0 refills | Status: DC
  Filled 2023-12-19: qty 30, 30d supply, fill #0

## 2023-12-19 NOTE — Progress Notes (Signed)
 Specialty Pharmacy Refill Coordination Note  Diane Paul is a 83 y.o. female contacted today regarding refills of specialty medication(s) Lenvatinib  Mesylate (LENVIMA )   Patient requested Delivery   Delivery date: 12/24/23   Verified address: [address]  [place]   Medication will be filled on 12/23/23, pending refill approval.   This fill date is pending response to refill request from provider. Patient is aware and if they have not received fill by intended date they must follow up with pharmacy.

## 2024-01-01 NOTE — Assessment & Plan Note (Addendum)
 Continue levothyroxine . Refill 50 mcg today. Continue to monitor fT4

## 2024-01-01 NOTE — Assessment & Plan Note (Addendum)
 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 

## 2024-01-01 NOTE — Progress Notes (Unsigned)
 [facility] OFFICE PROGRESS NOTE  Patient Care Team: Duanne Butler DASEN, MD as PCP - General (Family Medicine)  83 y.o.w. 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   Diagnosis: stage IV clear cell RCC Treatment: Right nephrectomy. 04/26/23 started pembrolizumab . Delayed started of Lenvatinib  at 10 mg daily due to wound on 05/07/23.   Hypothyroidism will be monitored. 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 Refill 50 mcg today. Continue to monitor fT4  No orders of the defined types were placed in this encounter.    Pauletta JAYSON Chihuahua, MD  INTERVAL HISTORY: Patient returns for follow-up.  Oncology History  History of breast cancer  06/11/2011 Surgery   Right breast mastectomy with right axillary sentinel lymph node biopsy 1.3 cm invasive lobular cancer, grade 1, atypical lobular hyperplasia, focal LV I, ER 91%, PR 25%, HER-2 negative, 0/8 lymph nodes T1 cN0 stage IA   07/25/2011 -  Anti-estrogen oral therapy   Letrozole  2.5 mg daily 7 years was a plan   Metastatic renal cell carcinoma to bone (HCC)  02/01/2023 Initial Diagnosis   Renal cell carcinoma (HCC)   02/05/2023 Imaging   Bone scan: No evidence of osseous metastatic disease.    04/03/2023 Pathology Results   A. KIDNEY, RIGHT, RADICAL NEPHRECTOMY:  Clear cell renal cell carcinoma with rhabdoid, sarcomatoid Features and  tumor necrosis, nuclear grade 4, size 12.0 cm  Tumor extends into perinephric, renal sinus fat and segmental branches  of renal vein (pT3a)  Ureteral, vascular  and all margins of resection are negative for tumor    Benign adrenal gland    04/23/2023 - 04/23/2023 Chemotherapy   Patient is on Treatment Plan : RENAL CELL Pembrolizumab  (200) + Axitinib q21d     04/25/2023 -  Chemotherapy   Patient is on Treatment Plan : Clear Cell RCC Pembrolizumab  (200) q21d     08/02/2023 Imaging   CT CAP  Improving mediastinal lymph nodes as well as multiple bilateral lung nodules. There are several residual lung nodules identified.   Known bone metastases   No developing new mass lesion or nodal enlargement.   Gallbladder full of stones and is distended, similar to previous.   Pelvic prolapse identified of the anterior posterior compartments.   Metallic foci along the lumen of the cecum are linear and have appearance of clips. Please correlate for ingested material or intervention.   08/02/2023 Imaging   MRI Brain 1. Negative for brain metastasis. 2. Newly enhancing 9 mm lesion in the high left parietal bone, possible early bony metastasis   11/29/2023 Imaging   CT CAP IMPRESSION: 1. Status post right nephrectomy and adrenalectomy. No suspicious soft tissue or contrast enhancement in the nephrectomy bed. 2. Unchanged pulmonary metastases. 3. Unchanged osseous metastases. 4. No evidence of new metastatic disease in the chest, abdomen, or pelvis. 5. Cholelithiasis.  Lungs/Pleura:  Unchanged nodule of the posterior right pulmonary apex measuring 1.8 x 1.3 cm (series 4, image 27).  Unchanged nodule of the paramedian right upper lobe measuring 1.0 x 0.6 cm (series 4, image 29).  Unchanged nodule of the dependent left lower lobe measuring 0.4  cm (series 4, image 70).  Unchanged subpleural nodule of the dependent left lower lobe measuring 1.2 x 0.6 cm (series 4, image 87). No pleural effusion or pneumothorax.   Unchanged mixed lytic and sclerotic metastasis of T3 (series 6, image 105). Callused fracture deformity of the lateral right eighth rib with  interval healing (series 2, image 39). Unchanged small rim sclerotic metastasis of S1 (series 6, image 105) Unchanged faintly lytic metastasis of the left ilium (series 2, image 89).   11/29/2023 Imaging   CT: stable disease MRI brain: no new disease.   NSCLC of left lung (HCC)  03/18/2023 Initial Diagnosis   NSCLC of left lung (HCC)   06/07/2023 Cancer Staging   Staging form: Lung, AJCC 8th Edition - Clinical: cT1, cN1, cM0 - Signed by Tina Pauletta BROCKS, MD on 06/07/2023 Stage prefix: Initial diagnosis Histologic grade (G): GX Histologic grading system: 4 grade system      PHYSICAL EXAMINATION: ECOG PERFORMANCE STATUS: {CHL ONC ECOG [phone]}  There were no vitals filed for this visit. There were no vitals filed for this visit.  Relevant data reviewed during this visit included ***

## 2024-01-02 ENCOUNTER — Telehealth: Payer: Self-pay

## 2024-01-02 ENCOUNTER — Inpatient Hospital Stay

## 2024-01-02 ENCOUNTER — Other Ambulatory Visit: Payer: Self-pay

## 2024-01-02 ENCOUNTER — Inpatient Hospital Stay (HOSPITAL_BASED_OUTPATIENT_CLINIC_OR_DEPARTMENT_OTHER)

## 2024-01-02 VITALS — BP 148/87 | HR 79 | Resp 18

## 2024-01-02 VITALS — BP 177/89 | HR 77 | Temp 97.0°F | Resp 20 | Wt 197.9 lb

## 2024-01-02 DIAGNOSIS — C649 Malignant neoplasm of unspecified kidney, except renal pelvis: Secondary | ICD-10-CM | POA: Diagnosis not present

## 2024-01-02 DIAGNOSIS — C7951 Secondary malignant neoplasm of bone: Secondary | ICD-10-CM | POA: Diagnosis not present

## 2024-01-02 DIAGNOSIS — Z79890 Hormone replacement therapy (postmenopausal): Secondary | ICD-10-CM | POA: Diagnosis not present

## 2024-01-02 DIAGNOSIS — Z853 Personal history of malignant neoplasm of breast: Secondary | ICD-10-CM | POA: Diagnosis not present

## 2024-01-02 DIAGNOSIS — E039 Hypothyroidism, unspecified: Secondary | ICD-10-CM | POA: Diagnosis not present

## 2024-01-02 DIAGNOSIS — N1832 Chronic kidney disease, stage 3b: Secondary | ICD-10-CM | POA: Diagnosis not present

## 2024-01-02 DIAGNOSIS — C641 Malignant neoplasm of right kidney, except renal pelvis: Secondary | ICD-10-CM | POA: Diagnosis not present

## 2024-01-02 DIAGNOSIS — C7802 Secondary malignant neoplasm of left lung: Secondary | ICD-10-CM | POA: Diagnosis not present

## 2024-01-02 DIAGNOSIS — Z5112 Encounter for antineoplastic immunotherapy: Secondary | ICD-10-CM | POA: Diagnosis not present

## 2024-01-02 DIAGNOSIS — Z8673 Personal history of transient ischemic attack (TIA), and cerebral infarction without residual deficits: Secondary | ICD-10-CM | POA: Diagnosis not present

## 2024-01-02 DIAGNOSIS — Z9189 Other specified personal risk factors, not elsewhere classified: Secondary | ICD-10-CM | POA: Diagnosis not present

## 2024-01-02 DIAGNOSIS — C7801 Secondary malignant neoplasm of right lung: Secondary | ICD-10-CM | POA: Diagnosis not present

## 2024-01-02 DIAGNOSIS — Z905 Acquired absence of kidney: Secondary | ICD-10-CM | POA: Diagnosis not present

## 2024-01-02 DIAGNOSIS — Z923 Personal history of irradiation: Secondary | ICD-10-CM | POA: Diagnosis not present

## 2024-01-02 DIAGNOSIS — C3412 Malignant neoplasm of upper lobe, left bronchus or lung: Secondary | ICD-10-CM | POA: Diagnosis not present

## 2024-01-02 DIAGNOSIS — Z79620 Long term (current) use of immunosuppressive biologic: Secondary | ICD-10-CM | POA: Diagnosis not present

## 2024-01-02 LAB — CBC WITH DIFFERENTIAL (CANCER CENTER ONLY)
Abs Immature Granulocytes: 0.01 K/uL (ref 0.00–0.07)
Basophils Absolute: 0 K/uL (ref 0.0–0.1)
Basophils Relative: 1 %
Eosinophils Absolute: 0.2 K/uL (ref 0.0–0.5)
Eosinophils Relative: 3 %
HCT: 37.4 % (ref 36.0–46.0)
Hemoglobin: 11.6 g/dL — ABNORMAL LOW (ref 12.0–15.0)
Immature Granulocytes: 0 %
Lymphocytes Relative: 28 %
Lymphs Abs: 1.5 K/uL (ref 0.7–4.0)
MCH: 27.6 pg (ref 26.0–34.0)
MCHC: 31 g/dL (ref 30.0–36.0)
MCV: 89 fL (ref 80.0–100.0)
Monocytes Absolute: 0.5 K/uL (ref 0.1–1.0)
Monocytes Relative: 10 %
Neutro Abs: 3.1 K/uL (ref 1.7–7.7)
Neutrophils Relative %: 58 %
Platelet Count: 227 K/uL (ref 150–400)
RBC: 4.2 MIL/uL (ref 3.87–5.11)
RDW: 13.3 % (ref 11.5–15.5)
WBC Count: 5.4 K/uL (ref 4.0–10.5)
nRBC: 0 % (ref 0.0–0.2)

## 2024-01-02 LAB — CMP (CANCER CENTER ONLY)
ALT: 9 U/L (ref 0–44)
AST: 14 U/L — ABNORMAL LOW (ref 15–41)
Albumin: 3.7 g/dL (ref 3.5–5.0)
Alkaline Phosphatase: 47 U/L (ref 38–126)
Anion gap: 5 (ref 5–15)
BUN: 22 mg/dL (ref 8–23)
CO2: 29 mmol/L (ref 22–32)
Calcium: 8.8 mg/dL — ABNORMAL LOW (ref 8.9–10.3)
Chloride: 106 mmol/L (ref 98–111)
Creatinine: 1.37 mg/dL — ABNORMAL HIGH (ref 0.44–1.00)
GFR, Estimated: 39 mL/min — ABNORMAL LOW (ref >60)
Glucose, Bld: 98 mg/dL (ref 70–99)
Potassium: 4.2 mmol/L (ref 3.5–5.1)
Sodium: 140 mmol/L (ref 135–145)
Total Bilirubin: 0.3 mg/dL (ref 0.0–1.2)
Total Protein: 7 g/dL (ref 6.5–8.1)

## 2024-01-02 LAB — TOTAL PROTEIN, URINE DIPSTICK: Protein, ur: NEGATIVE mg/dL

## 2024-01-02 LAB — T4, FREE: Free T4: 0.61 ng/dL (ref 0.61–1.12)

## 2024-01-02 LAB — LACTATE DEHYDROGENASE: LDH: 189 U/L (ref 98–192)

## 2024-01-02 MED ORDER — SODIUM CHLORIDE 0.9 % IV SOLN
200.0000 mg | Freq: Once | INTRAVENOUS | Status: AC
  Administered 2024-01-02: 200 mg via INTRAVENOUS
  Filled 2024-01-02: qty 200

## 2024-01-02 MED ORDER — SODIUM CHLORIDE 0.9% FLUSH: 10.0000 mL | INTRAVENOUS | Status: DC | PRN

## 2024-01-02 MED ORDER — LEVOTHYROXINE SODIUM 75 MCG PO TABS: 75.0000 ug | ORAL_TABLET | Freq: Every day | ORAL | 0 refills | Status: DC

## 2024-01-02 MED ORDER — SODIUM CHLORIDE 0.9 % IV SOLN: INTRAVENOUS | Status: DC

## 2024-01-02 NOTE — Assessment & Plan Note (Addendum)
 Bone metastases Patient has no dentaI issues and teeth.  Started Zometa  3 mg every 3 months in Feb 2025 Continue zometa . Recommend patient stop mild vitamin D  [phone] IU and calcium  1200 mg daily Next Zometa  on 9/18

## 2024-01-02 NOTE — Patient Instructions (Signed)

## 2024-01-02 NOTE — Telephone Encounter (Signed)
 T/C from pt's daughter stating pt has an upset stomach but thinks it is only from something she ate.   No fever, N/V or diarrhea. Advised OK to keep today's appt and pt will be evaluated at today's visit before treatment

## 2024-01-02 NOTE — Progress Notes (Signed)
 Dr. Tina wants to hold the zometa  for 3 weeks.

## 2024-01-02 NOTE — Progress Notes (Signed)
 Please let daughter know. Increase levothyroxine  to 75 mcg daily. New script sent to her pharmacy. Thank you

## 2024-01-03 ENCOUNTER — Other Ambulatory Visit: Payer: Self-pay

## 2024-01-10 ENCOUNTER — Telehealth: Payer: Self-pay | Admitting: *Deleted

## 2024-01-10 NOTE — Telephone Encounter (Signed)
 PC to patient's daughter, [place] - no answer, left VM.  Informed her that Dr Tina is increasing patient's levothyroxine  dose to 75 mcg every day.  A prescription has been sent to her pharmacy.  Instructed daughter to contact this office with any questions/concerns, [phone].

## 2024-01-15 ENCOUNTER — Other Ambulatory Visit: Payer: Self-pay

## 2024-01-15 DIAGNOSIS — C641 Malignant neoplasm of right kidney, except renal pelvis: Secondary | ICD-10-CM

## 2024-01-16 ENCOUNTER — Other Ambulatory Visit: Payer: Self-pay

## 2024-01-16 MED ORDER — LENVATINIB (10 MG DAILY DOSE) 10 MG PO CPPK
10.0000 mg | ORAL_CAPSULE | Freq: Every day | ORAL | 0 refills | Status: DC
  Filled 2024-01-16 – 2024-01-17 (×2): qty 30, 30d supply, fill #0

## 2024-01-17 ENCOUNTER — Other Ambulatory Visit: Payer: Self-pay

## 2024-01-17 ENCOUNTER — Other Ambulatory Visit: Payer: Self-pay | Admitting: Pharmacy Technician

## 2024-01-17 NOTE — Progress Notes (Signed)
 Specialty Pharmacy Refill Coordination Note  Diane Paul is a 83 y.o. female contacted today regarding refills of specialty medication(s) Lenvatinib  Mesylate (LENVIMA )   Patient requested Delivery   Delivery date: 01/21/24   Verified address: [address]  [place] [address]   Medication will be filled on 01/20/24.  Spoke to patient's daughter.

## 2024-01-23 NOTE — Progress Notes (Unsigned)
 [facility] OFFICE PROGRESS NOTE  Patient Care Team: Duanne Butler DASEN, MD as PCP - General (Family Medicine)   83 y.o.w. 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   Diagnosis: stage IV clear cell RCC Treatment: Right nephrectomy. 04/26/23 started pembrolizumab . Delayed started of Lenvatinib  at 10 mg daily due to wound on 05/07/23.   Hypothyroidism from treatment.  Dose increased last time.  Will need to be monitored. 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  Next treatment will be with 9/18 Keytruda   At high risk for fracture Bone metastases Patient has no dentaI issues and teeth.  Started Zometa  3 mg every 3 months in Feb 2025 Continue zometa . Recommend patient stop mild vitamin D  [phone] IU and calcium  1200 mg daily Next Zometa  on 9/18 Acquired hypothyroidism Continue levothyroxine  75 mcg once daily.  Will change dose if needed when lab resulted tomorrow. Continue to monitor fT4  No orders of the defined types were placed in this encounter.    Pauletta JAYSON Chihuahua, MD  INTERVAL HISTORY: Patient returns for follow-up.  Oncology History  History of breast cancer  06/11/2011 Surgery   Right breast mastectomy with right axillary sentinel lymph node biopsy 1.3 cm invasive lobular cancer, grade 1, atypical lobular hyperplasia, focal LV I, ER 91%, PR 25%, HER-2 negative, 0/8 lymph nodes T1 cN0 stage IA   07/25/2011 -  Anti-estrogen oral therapy   Letrozole  2.5 mg daily 7 years was a plan   Metastatic renal cell carcinoma to bone (HCC)  02/01/2023 Initial Diagnosis   Renal cell carcinoma  (HCC)   02/05/2023 Imaging   Bone scan: No evidence of osseous metastatic disease.    04/03/2023 Pathology Results   A. 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 There are several residual lung nodules identified.   Known bone metastases   No developing new mass lesion or nodal enlargement.   Gallbladder full of stones and is distended, similar to previous.   Pelvic prolapse identified of the anterior posterior compartments.   Metallic foci along the lumen of the cecum are linear and have appearance of clips. Please correlate for ingested material or intervention.   08/02/2023 Imaging   MRI Brain 1. Negative for brain metastasis. 2. Newly enhancing 9 mm lesion in the high left parietal bone, possible early bony metastasis   11/29/2023 Imaging   CT CAP IMPRESSION: 1. Status post right nephrectomy and adrenalectomy. No suspicious soft tissue or contrast enhancement in the nephrectomy  bed. 2. Unchanged pulmonary metastases. 3. Unchanged osseous metastases. 4. No evidence of new metastatic disease in the chest, abdomen, or pelvis. 5. Cholelithiasis.  Lungs/Pleura:  Unchanged nodule of the posterior right pulmonary apex measuring 1.8 x 1.3 cm (series 4, image 27).  Unchanged nodule of the paramedian right upper lobe measuring 1.0 x 0.6 cm  (series 4, image 29).  Unchanged nodule of the dependent left lower lobe measuring 0.4 cm (series 4, image 70).  Unchanged subpleural nodule of the dependent left lower lobe measuring 1.2 x 0.6 cm (series 4, image 87). No pleural effusion or pneumothorax.   Unchanged mixed lytic and sclerotic metastasis of T3 (series 6, image 105). Callused fracture deformity of the lateral right eighth rib with interval healing (series 2, image 39). Unchanged small rim sclerotic metastasis of S1 (series 6, image 105) Unchanged faintly lytic metastasis of the left ilium (series 2, image 89).   11/29/2023 Imaging   CT: stable disease MRI brain: no new disease.   NSCLC of left lung (HCC)  03/18/2023 Initial Diagnosis   NSCLC of left lung (HCC)   06/07/2023 Cancer Staging   Staging form: Lung, AJCC 8th Edition - Clinical: cT1, cN1, cM0 - Signed by Tina Pauletta BROCKS, MD on 06/07/2023 Stage prefix: Initial diagnosis Histologic grade (G): GX Histologic grading system: 4 grade system      PHYSICAL EXAMINATION: ECOG PERFORMANCE STATUS: {CHL ONC ECOG PS:[phone]}  There were no vitals filed for this visit. There were no vitals filed for this visit.  GENERAL: alert, no distress and comfortable SKIN: skin color normal and no jaundice or bruising or petechiae on exposed skin EYES: normal, sclera clear OROPHARYNX: no exudate  NECK: No palpable mass LYMPH:  no palpable cervical, axillary lymphadenopathy  LUNGS: clear to auscultation and no wheeze or rales with normal breathing effort HEART: regular rate & rhythm  ABDOMEN: abdomen soft, non-tender and nondistended. Musculoskeletal: no edema NEURO: no focal motor/sensory deficits  Relevant data reviewed during this visit included labs.  New labs ordered.

## 2024-01-23 NOTE — Assessment & Plan Note (Addendum)
 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  Next treatment will be with 9/18 Keytruda 

## 2024-01-23 NOTE — Assessment & Plan Note (Addendum)
 Continue levothyroxine  75 mcg once daily.  Will change dose if needed Continue to monitor fT4

## 2024-01-23 NOTE — Assessment & Plan Note (Addendum)
 Bone metastases Patient has no dentaI issues and teeth.  Started Zometa  3 mg every 3 months in Feb 2025 Continue zometa . Recommend patient stop mild vitamin D  [phone] IU and calcium  1200 mg daily Next Zometa  today

## 2024-01-24 ENCOUNTER — Inpatient Hospital Stay

## 2024-01-24 ENCOUNTER — Other Ambulatory Visit (HOSPITAL_COMMUNITY): Payer: Self-pay

## 2024-01-24 ENCOUNTER — Telehealth: Payer: Self-pay

## 2024-01-24 ENCOUNTER — Other Ambulatory Visit: Payer: Self-pay

## 2024-01-24 VITALS — BP 138/88 | HR 79 | Temp 97.8°F | Resp 15 | Wt 200.6 lb

## 2024-01-24 DIAGNOSIS — Z79890 Hormone replacement therapy (postmenopausal): Secondary | ICD-10-CM | POA: Insufficient documentation

## 2024-01-24 DIAGNOSIS — C649 Malignant neoplasm of unspecified kidney, except renal pelvis: Secondary | ICD-10-CM

## 2024-01-24 DIAGNOSIS — C3412 Malignant neoplasm of upper lobe, left bronchus or lung: Secondary | ICD-10-CM | POA: Insufficient documentation

## 2024-01-24 DIAGNOSIS — E039 Hypothyroidism, unspecified: Secondary | ICD-10-CM | POA: Diagnosis not present

## 2024-01-24 DIAGNOSIS — C7801 Secondary malignant neoplasm of right lung: Secondary | ICD-10-CM | POA: Diagnosis not present

## 2024-01-24 DIAGNOSIS — C7951 Secondary malignant neoplasm of bone: Secondary | ICD-10-CM

## 2024-01-24 DIAGNOSIS — Z8673 Personal history of transient ischemic attack (TIA), and cerebral infarction without residual deficits: Secondary | ICD-10-CM | POA: Insufficient documentation

## 2024-01-24 DIAGNOSIS — N1832 Chronic kidney disease, stage 3b: Secondary | ICD-10-CM | POA: Diagnosis not present

## 2024-01-24 DIAGNOSIS — Z923 Personal history of irradiation: Secondary | ICD-10-CM | POA: Insufficient documentation

## 2024-01-24 DIAGNOSIS — Z9189 Other specified personal risk factors, not elsewhere classified: Secondary | ICD-10-CM | POA: Diagnosis not present

## 2024-01-24 DIAGNOSIS — C7802 Secondary malignant neoplasm of left lung: Secondary | ICD-10-CM | POA: Insufficient documentation

## 2024-01-24 DIAGNOSIS — C641 Malignant neoplasm of right kidney, except renal pelvis: Secondary | ICD-10-CM

## 2024-01-24 DIAGNOSIS — M8580 Other specified disorders of bone density and structure, unspecified site: Secondary | ICD-10-CM

## 2024-01-24 DIAGNOSIS — Z905 Acquired absence of kidney: Secondary | ICD-10-CM | POA: Insufficient documentation

## 2024-01-24 DIAGNOSIS — Z5112 Encounter for antineoplastic immunotherapy: Secondary | ICD-10-CM | POA: Diagnosis not present

## 2024-01-24 DIAGNOSIS — Z853 Personal history of malignant neoplasm of breast: Secondary | ICD-10-CM | POA: Insufficient documentation

## 2024-01-24 DIAGNOSIS — Z79620 Long term (current) use of immunosuppressive biologic: Secondary | ICD-10-CM | POA: Diagnosis not present

## 2024-01-24 LAB — CMP (CANCER CENTER ONLY)
ALT: 8 U/L (ref 0–44)
AST: 13 U/L — ABNORMAL LOW (ref 15–41)
Albumin: 3.9 g/dL (ref 3.5–5.0)
Alkaline Phosphatase: 51 U/L (ref 38–126)
Anion gap: 6 (ref 5–15)
BUN: 24 mg/dL — ABNORMAL HIGH (ref 8–23)
CO2: 29 mmol/L (ref 22–32)
Calcium: 8.7 mg/dL — ABNORMAL LOW (ref 8.9–10.3)
Chloride: 105 mmol/L (ref 98–111)
Creatinine: 1.53 mg/dL — ABNORMAL HIGH (ref 0.44–1.00)
GFR, Estimated: 34 mL/min — ABNORMAL LOW (ref >60)
Glucose, Bld: 97 mg/dL (ref 70–99)
Potassium: 4.1 mmol/L (ref 3.5–5.1)
Sodium: 140 mmol/L (ref 135–145)
Total Bilirubin: 0.3 mg/dL (ref 0.0–1.2)
Total Protein: 7.3 g/dL (ref 6.5–8.1)

## 2024-01-24 LAB — CBC WITH DIFFERENTIAL (CANCER CENTER ONLY)
Abs Immature Granulocytes: 0.01 K/uL (ref 0.00–0.07)
Basophils Absolute: 0 K/uL (ref 0.0–0.1)
Basophils Relative: 0 %
Eosinophils Absolute: 0.1 K/uL (ref 0.0–0.5)
Eosinophils Relative: 3 %
HCT: 36.4 % (ref 36.0–46.0)
Hemoglobin: 11.4 g/dL — ABNORMAL LOW (ref 12.0–15.0)
Immature Granulocytes: 0 %
Lymphocytes Relative: 25 %
Lymphs Abs: 1.4 K/uL (ref 0.7–4.0)
MCH: 27.6 pg (ref 26.0–34.0)
MCHC: 31.3 g/dL (ref 30.0–36.0)
MCV: 88.1 fL (ref 80.0–100.0)
Monocytes Absolute: 0.7 K/uL (ref 0.1–1.0)
Monocytes Relative: 13 %
Neutro Abs: 3.4 K/uL (ref 1.7–7.7)
Neutrophils Relative %: 59 %
Platelet Count: 222 K/uL (ref 150–400)
RBC: 4.13 MIL/uL (ref 3.87–5.11)
RDW: 12.8 % (ref 11.5–15.5)
WBC Count: 5.6 K/uL (ref 4.0–10.5)
nRBC: 0 % (ref 0.0–0.2)

## 2024-01-24 LAB — LACTATE DEHYDROGENASE: LDH: 205 U/L — ABNORMAL HIGH (ref 98–192)

## 2024-01-24 LAB — T4, FREE: Free T4: 0.99 ng/dL (ref 0.61–1.12)

## 2024-01-24 LAB — TOTAL PROTEIN, URINE DIPSTICK: Protein, ur: NEGATIVE mg/dL

## 2024-01-24 MED ORDER — ZOLEDRONIC ACID 4 MG/5ML IV CONC
3.0000 mg | Freq: Once | INTRAVENOUS | Status: AC
  Administered 2024-01-24: 3 mg via INTRAVENOUS
  Filled 2024-01-24: qty 3.75

## 2024-01-24 MED ORDER — SODIUM CHLORIDE 0.9 % IV SOLN: INTRAVENOUS | Status: DC

## 2024-01-24 MED ORDER — LENVATINIB (10 MG DAILY DOSE) 10 MG PO CPPK
20.0000 mg | ORAL_CAPSULE | Freq: Every day | ORAL | 0 refills | Status: DC
  Filled 2024-01-24 – 2024-02-12 (×2): qty 60, 30d supply, fill #0

## 2024-01-24 MED ORDER — SODIUM CHLORIDE 0.9 % IV SOLN
200.0000 mg | Freq: Once | INTRAVENOUS | Status: AC
  Administered 2024-01-24: 200 mg via INTRAVENOUS
  Filled 2024-01-24: qty 200

## 2024-01-24 NOTE — Addendum Note (Signed)
 Addended by: TINA HUDSON on: 01/24/2024 10:58 AM   Modules accepted: Orders

## 2024-01-24 NOTE — Telephone Encounter (Signed)
 Oral Oncology Patient Advocate Encounter  After completing a benefits investigation, prior authorization for lenvatinib  10 mg (LENVIMA ) capsule  is not required at this time through [facility].  Refill is too soon, will pay on 01/31/24. Dr. Tina wrote Katie back that he is going to have her double up starting in two weeks after her vacation.   Lucie Lamer, CPhT   [facility] Special[facility] Pharm[facility] Services Pharmacy Technician Patient Advocate Specialist [facility] Phone: [phone]  Fax: [phone] [email or web address]

## 2024-01-24 NOTE — Patient Instructions (Addendum)
 [facility] - A DEPT OF [facility]  Discharge Instructions: Thank you for choosing [facility] to provide your oncology and hematology care.   If you have a lab appointment with the Cancer Center, please go directly to the Cancer Center and check in at the registration area.   Wear comfortable clothing and clothing appropriate for easy access to any Portacath or PICC line.   We strive to give you quality time with your provider. You may need to reschedule your appointment if you arrive late (15 or more minutes).  Arriving late affects you and other patients whose appointments are after yours.  Also, if you miss three or more appointments without notifying the office, you may be dismissed from the clinic at the provider's discretion.      For prescription refill requests, have your pharmacy contact our office and allow 72 hours for refills to be completed.    Today you received the following chemotherapy and/or immunotherapy agents: Pembrolizumab  Rande Lawman)     To help prevent nausea and vomiting after your treatment, we encourage you to take your nausea medication as directed.  BELOW ARE SYMPTOMS THAT SHOULD BE REPORTED IMMEDIATELY: *FEVER GREATER THAN 100.4 F (38 C) OR HIGHER *CHILLS OR SWEATING *NAUSEA AND VOMITING THAT IS NOT CONTROLLED WITH YOUR NAUSEA MEDICATION *UNUSUAL SHORTNESS OF BREATH *UNUSUAL BRUISING OR BLEEDING *URINARY PROBLEMS (pain or burning when urinating, or frequent urination) *BOWEL PROBLEMS (unusual diarrhea, constipation, pain near the anus) TENDERNESS IN MOUTH AND THROAT WITH OR WITHOUT PRESENCE OF ULCERS (sore throat, sores in mouth, or a toothache) UNUSUAL RASH, SWELLING OR PAIN  UNUSUAL VAGINAL DISCHARGE OR ITCHING   Items with * indicate a potential emergency and should be followed up as soon as possible or go to the Emergency Department if any problems should occur.  Please show the CHEMOTHERAPY ALERT CARD or  IMMUNOTHERAPY ALERT CARD at check-in to the Emergency Department and triage nurse.  Should you have questions after your visit or need to cancel or reschedule your appointment, please contact [facility] - A DEPT OF [facility]  Dept: [phone]  and follow the prompts.  Office hours are 8:00 a.m. to 4:30 p.m. Monday - Friday. Please note that voicemails left after 4:00 p.m. may not be returned until the following business day.  We are closed weekends and major holidays. You have access to a nurse at all times for urgent questions. Please call the main number to the clinic Dept: [phone] and follow the prompts.   For any non-urgent questions, you may also contact your provider using MyChart. We now offer e-Visits for anyone 10 and older to request care online for non-urgent symptoms. For details visit [email or web address].   Also download the MyChart app! Go to the app store, search "MyChart", open the app, select [place], and log in with your MyChart username and password.  Zoledronic Acid Injection (Cancer) What is this medication? ZOLEDRONIC ACID (ZOE le dron ik AS id) treats high calcium levels in the blood caused by cancer. It may also be used with chemotherapy to treat weakened bones caused by cancer. It works by slowing down the release of calcium from bones. This lowers calcium levels in your blood. It also makes your bones stronger and less likely to break (fracture). It belongs to a group of medications called bisphosphonates. This medicine may be used for other purposes; ask your health care  provider or pharmacist if you have questions. COMMON BRAND NAME(S): Zometa, Zometa Powder What should I tell my care team before I take this medication? They need to know if you have any of these conditions: Dehydration Dental disease Kidney disease Liver disease Low levels of calcium in the blood Lung or breathing disease, such as asthma Receiving  steroids, such as dexamethasone or prednisone An unusual or allergic reaction to zoledronic acid, other medications, foods, dyes, or preservatives Pregnant or trying to get pregnant Breast-feeding How should I use this medication? This medication is injected into a vein. It is given by your care team in a hospital or clinic setting. Talk to your care team about the use of this medication in children. Special care may be needed. Overdosage: If you think you have taken too much of this medicine contact a poison control center or emergency room at once. NOTE: This medicine is only for you. Do not share this medicine with others. What if I miss a dose? Keep appointments for follow-up doses. It is important not to miss your dose. Call your care team if you are unable to keep an appointment. What may interact with this medication? Certain antibiotics given by injection Diuretics, such as bumetanide, furosemide NSAIDs, medications for pain and inflammation, such as ibuprofen or naproxen Teriparatide Thalidomide This list may not describe all possible interactions. Give your health care provider a list of all the medicines, herbs, non-prescription drugs, or dietary supplements you use. Also tell them if you smoke, drink alcohol, or use illegal drugs. Some items may interact with your medicine. What should I watch for while using this medication? Visit your care team for regular checks on your progress. It may be some time before you see the benefit from this medication. Some people who take this medication have severe bone, joint, or muscle pain. This medication may also increase your risk for jaw problems or a broken thigh bone. Tell your care team right away if you have severe pain in your jaw, bones, joints, or muscles. Tell you care team if you have any pain that does not go away or that gets worse. Tell your dentist and dental surgeon that you are taking this medication. You should not have major  dental surgery while on this medication. See your dentist to have a dental exam and fix any dental problems before starting this medication. Take good care of your teeth while on this medication. Make sure you see your dentist for regular follow-up appointments. You should make sure you get enough calcium and vitamin D while you are taking this medication. Discuss the foods you eat and the vitamins you take with your care team. Check with your care team if you have severe diarrhea, nausea, and vomiting, or if you sweat a lot. The loss of too much body fluid may make it dangerous for you to take this medication. You may need bloodwork while taking this medication. Talk to your care team if you wish to become pregnant or think you might be pregnant. This medication can cause serious birth defects. What side effects may I notice from receiving this medication? Side effects that you should report to your care team as soon as possible: Allergic reactions--skin rash, itching, hives, swelling of the face, lips, tongue, or throat Kidney injury--decrease in the amount of urine, swelling of the ankles, hands, or feet Low calcium level--muscle pain or cramps, confusion, tingling, or numbness in the hands or feet Osteonecrosis of the jaw--pain, swelling, or  redness in the mouth, numbness of the jaw, poor healing after dental work, unusual discharge from the mouth, visible bones in the mouth Severe bone, joint, or muscle pain Side effects that usually do not require medical attention (report to your care team if they continue or are bothersome): Constipation Fatigue Fever Loss of appetite Nausea Stomach pain This list may not describe all possible side effects. Call your doctor for medical advice about side effects. You may report side effects to FDA at 1-800-FDA-1088. Where should I keep my medication? This medication is given in a hospital or clinic. It will not be stored at home. NOTE: This sheet is a  summary. It may not cover all possible information. If you have questions about this medicine, talk to your doctor, pharmacist, or health care provider.  2024 Elsevier/Gold Standard (2021-06-16 00:00:00)

## 2024-02-02 ENCOUNTER — Other Ambulatory Visit: Payer: Self-pay

## 2024-02-09 ENCOUNTER — Other Ambulatory Visit: Payer: Self-pay

## 2024-02-12 ENCOUNTER — Other Ambulatory Visit: Payer: Self-pay | Admitting: Pharmacy Technician

## 2024-02-12 ENCOUNTER — Other Ambulatory Visit: Payer: Self-pay

## 2024-02-12 NOTE — Progress Notes (Signed)
 Specialty Pharmacy Refill Coordination Note  Diane Paul is a 83 y.o. female contacted today regarding refills of specialty medication(s) Lenvatinib  Mesylate (LENVIMA )  Spoke with Daughter   Patient requested Delivery   Delivery date: 02/18/24   Verified address: [address]  [name]   Medication will be filled on 02/17/24.

## 2024-02-13 ENCOUNTER — Other Ambulatory Visit: Payer: Self-pay

## 2024-02-13 NOTE — Progress Notes (Unsigned)
 [facility] OFFICE PROGRESS NOTE  Patient Care Team: Duanne Butler DASEN, MD as PCP - General (Family Medicine)  Assessment & Plan   No orders of the defined types were placed in this encounter.    Pauletta JAYSON Chihuahua, MD  INTERVAL HISTORY: Patient returns for follow-up.  Oncology History  History of breast cancer  06/11/2011 Surgery   Right breast mastectomy with right axillary sentinel lymph node biopsy 1.3 cm invasive lobular cancer, grade 1, atypical lobular hyperplasia, focal LV I, ER 91%, PR 25%, HER-2 negative, 0/8 lymph nodes T1 cN0 stage IA   07/25/2011 -  Anti-estrogen oral therapy   Letrozole  2.5 mg daily 7 years was a plan   Metastatic renal cell carcinoma to bone (HCC)  02/01/2023 Initial Diagnosis   Renal cell carcinoma (HCC)   02/05/2023 Imaging   Bone scan: No evidence of osseous metastatic disease.    04/03/2023 Pathology Results   A. 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 There are several residual lung nodules identified.   Known bone metastases   No developing new mass lesion or nodal enlargement.   Gallbladder full of stones and is distended, similar to previous.   Pelvic prolapse identified of the anterior posterior compartments.   Metallic foci along the lumen of the cecum are linear and  have appearance of clips. Please correlate for ingested material or intervention.   08/02/2023 Imaging   MRI Brain 1. Negative for brain metastasis. 2. Newly enhancing 9 mm lesion in the high left parietal bone, possible early bony metastasis   11/29/2023 Imaging   CT CAP IMPRESSION: 1. Status post right nephrectomy and adrenalectomy. No suspicious soft tissue or contrast enhancement in the nephrectomy bed. 2. Unchanged pulmonary metastases. 3. Unchanged osseous metastases. 4. No evidence of new metastatic disease in the chest, abdomen, or pelvis. 5. Cholelithiasis.  Lungs/Pleura:  Unchanged nodule of the posterior right pulmonary apex measuring 1.8 x 1.3 cm (series 4, image 27).  Unchanged nodule of the paramedian right upper lobe measuring 1.0 x 0.6 cm (series 4, image 29).  Unchanged nodule of the dependent left lower lobe measuring 0.4 cm (series 4, image 70).  Unchanged subpleural nodule of the dependent left lower lobe measuring 1.2 x 0.6 cm (series 4, image 87). No pleural effusion or pneumothorax.   Unchanged mixed lytic and sclerotic metastasis of T3 (series 6, image 105). Callused fracture deformity of the lateral right eighth rib with interval healing (series 2, image 39). Unchanged small rim sclerotic metastasis of S1 (series 6, image 105) Unchanged faintly lytic metastasis of the left ilium (series 2, image 89).   11/29/2023 Imaging   CT: stable disease MRI brain: no new disease.   NSCLC of left lung (HCC)  03/18/2023 Initial Diagnosis   NSCLC of left lung (HCC)   06/07/2023  Cancer Staging   Staging form: Lung, AJCC 8th Edition - Clinical: cT1, cN1, cM0 - Signed by Tina Pauletta BROCKS, MD on 06/07/2023 Stage prefix: Initial diagnosis Histologic grade (G): GX Histologic grading system: 4 grade system      PHYSICAL EXAMINATION: ECOG PERFORMANCE STATUS: {CHL ONC ECOG PS:[id number]}  There were no vitals filed for this visit. There were no vitals filed for this  visit.  GENERAL: alert, no distress and comfortable SKIN: skin color normal and no jaundice or bruising or petechiae on exposed skin EYES: normal, sclera clear OROPHARYNX: no exudate  NECK: No palpable mass LYMPH:  no palpable cervical, axillary lymphadenopathy  LUNGS: clear to auscultation and no wheeze or rales with normal breathing effort HEART: regular rate & rhythm  ABDOMEN: abdomen soft, non-tender and nondistended. Musculoskeletal: no edema NEURO: no focal motor/sensory deficits  Relevant data reviewed during this visit included labs.  New labs ordered.

## 2024-02-13 NOTE — Assessment & Plan Note (Signed)
 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  Next treatment will be with today Keytruda 

## 2024-02-14 ENCOUNTER — Inpatient Hospital Stay

## 2024-02-14 ENCOUNTER — Other Ambulatory Visit: Payer: Self-pay

## 2024-02-14 VITALS — BP 146/86 | HR 70 | Temp 97.3°F | Resp 20 | Wt 195.5 lb

## 2024-02-14 DIAGNOSIS — Z79620 Long term (current) use of immunosuppressive biologic: Secondary | ICD-10-CM | POA: Diagnosis not present

## 2024-02-14 DIAGNOSIS — E039 Hypothyroidism, unspecified: Secondary | ICD-10-CM

## 2024-02-14 DIAGNOSIS — C641 Malignant neoplasm of right kidney, except renal pelvis: Secondary | ICD-10-CM | POA: Insufficient documentation

## 2024-02-14 DIAGNOSIS — Z9189 Other specified personal risk factors, not elsewhere classified: Secondary | ICD-10-CM | POA: Diagnosis not present

## 2024-02-14 DIAGNOSIS — Z8673 Personal history of transient ischemic attack (TIA), and cerebral infarction without residual deficits: Secondary | ICD-10-CM | POA: Insufficient documentation

## 2024-02-14 DIAGNOSIS — Z5112 Encounter for antineoplastic immunotherapy: Secondary | ICD-10-CM | POA: Insufficient documentation

## 2024-02-14 DIAGNOSIS — C7801 Secondary malignant neoplasm of right lung: Secondary | ICD-10-CM | POA: Diagnosis not present

## 2024-02-14 DIAGNOSIS — C7802 Secondary malignant neoplasm of left lung: Secondary | ICD-10-CM | POA: Insufficient documentation

## 2024-02-14 DIAGNOSIS — Z923 Personal history of irradiation: Secondary | ICD-10-CM | POA: Insufficient documentation

## 2024-02-14 DIAGNOSIS — C3412 Malignant neoplasm of upper lobe, left bronchus or lung: Secondary | ICD-10-CM | POA: Insufficient documentation

## 2024-02-14 DIAGNOSIS — Z79890 Hormone replacement therapy (postmenopausal): Secondary | ICD-10-CM | POA: Diagnosis not present

## 2024-02-14 DIAGNOSIS — C649 Malignant neoplasm of unspecified kidney, except renal pelvis: Secondary | ICD-10-CM | POA: Diagnosis not present

## 2024-02-14 DIAGNOSIS — Z905 Acquired absence of kidney: Secondary | ICD-10-CM | POA: Diagnosis not present

## 2024-02-14 DIAGNOSIS — Z853 Personal history of malignant neoplasm of breast: Secondary | ICD-10-CM | POA: Diagnosis not present

## 2024-02-14 DIAGNOSIS — N1832 Chronic kidney disease, stage 3b: Secondary | ICD-10-CM | POA: Insufficient documentation

## 2024-02-14 DIAGNOSIS — C7951 Secondary malignant neoplasm of bone: Secondary | ICD-10-CM

## 2024-02-14 LAB — T4, FREE: Free T4: 1.07 ng/dL (ref 0.61–1.12)

## 2024-02-14 LAB — LACTATE DEHYDROGENASE: LDH: 176 U/L (ref 98–192)

## 2024-02-14 LAB — CMP (CANCER CENTER ONLY)
ALT: 9 U/L (ref 0–44)
AST: 12 U/L — ABNORMAL LOW (ref 15–41)
Albumin: 3.9 g/dL (ref 3.5–5.0)
Alkaline Phosphatase: 50 U/L (ref 38–126)
Anion gap: 5 (ref 5–15)
BUN: 26 mg/dL — ABNORMAL HIGH (ref 8–23)
CO2: 27 mmol/L (ref 22–32)
Calcium: 9.3 mg/dL (ref 8.9–10.3)
Chloride: 105 mmol/L (ref 98–111)
Creatinine: 1.3 mg/dL — ABNORMAL HIGH (ref 0.44–1.00)
GFR, Estimated: 41 mL/min — ABNORMAL LOW (ref >60)
Glucose, Bld: 121 mg/dL — ABNORMAL HIGH (ref 70–99)
Potassium: 4.5 mmol/L (ref 3.5–5.1)
Sodium: 137 mmol/L (ref 135–145)
Total Bilirubin: 0.3 mg/dL (ref 0.0–1.2)
Total Protein: 7.5 g/dL (ref 6.5–8.1)

## 2024-02-14 LAB — CBC WITH DIFFERENTIAL (CANCER CENTER ONLY)
Abs Immature Granulocytes: 0.03 K/uL (ref 0.00–0.07)
Basophils Absolute: 0 K/uL (ref 0.0–0.1)
Basophils Relative: 1 %
Eosinophils Absolute: 0.2 K/uL (ref 0.0–0.5)
Eosinophils Relative: 2 %
HCT: 38 % (ref 36.0–46.0)
Hemoglobin: 11.8 g/dL — ABNORMAL LOW (ref 12.0–15.0)
Immature Granulocytes: 1 %
Lymphocytes Relative: 24 %
Lymphs Abs: 1.6 K/uL (ref 0.7–4.0)
MCH: 26.9 pg (ref 26.0–34.0)
MCHC: 31.1 g/dL (ref 30.0–36.0)
MCV: 86.8 fL (ref 80.0–100.0)
Monocytes Absolute: 0.7 K/uL (ref 0.1–1.0)
Monocytes Relative: 10 %
Neutro Abs: 4.1 K/uL (ref 1.7–7.7)
Neutrophils Relative %: 62 %
Platelet Count: 292 K/uL (ref 150–400)
RBC: 4.38 MIL/uL (ref 3.87–5.11)
RDW: 13.1 % (ref 11.5–15.5)
WBC Count: 6.6 K/uL (ref 4.0–10.5)
nRBC: 0 % (ref 0.0–0.2)

## 2024-02-14 LAB — TSH: TSH: 15.5 u[IU]/mL — ABNORMAL HIGH (ref 0.350–4.500)

## 2024-02-14 LAB — TOTAL PROTEIN, URINE DIPSTICK: Protein, ur: NEGATIVE mg/dL

## 2024-02-14 MED ORDER — SODIUM CHLORIDE 0.9% FLUSH: 10.0000 mL | INTRAVENOUS | Status: DC | PRN

## 2024-02-14 MED ORDER — SODIUM CHLORIDE 0.9 % IV SOLN: INTRAVENOUS | Status: DC

## 2024-02-14 MED ORDER — SODIUM CHLORIDE 0.9 % IV SOLN
200.0000 mg | Freq: Once | INTRAVENOUS | Status: AC
  Administered 2024-02-14: 200 mg via INTRAVENOUS
  Filled 2024-02-14: qty 200

## 2024-02-14 MED ORDER — LIDOCAINE-PRILOCAINE 2.5-2.5 % EX CREA: 1.0000 | TOPICAL_CREAM | CUTANEOUS | 0 refills | Status: AC | PRN

## 2024-02-14 NOTE — Patient Instructions (Signed)
 [facility] - A DEPT OF [facility]  Discharge Instructions: Thank you for choosing [facility] to provide your oncology and hematology care.   If you have a lab appointment with the Cancer Center, please go directly to the Cancer Center and check in at the registration area.   Wear comfortable clothing and clothing appropriate for easy access to any Portacath or PICC line.   We strive to give you quality time with your provider. You may need to reschedule your appointment if you arrive late (15 or more minutes).  Arriving late affects you and other patients whose appointments are after yours.  Also, if you miss three or more appointments without notifying the office, you may be dismissed from the clinic at the provider's discretion.      For prescription refill requests, have your pharmacy contact our office and allow 72 hours for refills to be completed.    Today you received the following chemotherapy and/or immunotherapy agents: Pembrolizumab  Rande Lawman)     To help prevent nausea and vomiting after your treatment, we encourage you to take your nausea medication as directed.  BELOW ARE SYMPTOMS THAT SHOULD BE REPORTED IMMEDIATELY: *FEVER GREATER THAN 100.4 F (38 C) OR HIGHER *CHILLS OR SWEATING *NAUSEA AND VOMITING THAT IS NOT CONTROLLED WITH YOUR NAUSEA MEDICATION *UNUSUAL SHORTNESS OF BREATH *UNUSUAL BRUISING OR BLEEDING *URINARY PROBLEMS (pain or burning when urinating, or frequent urination) *BOWEL PROBLEMS (unusual diarrhea, constipation, pain near the anus) TENDERNESS IN MOUTH AND THROAT WITH OR WITHOUT PRESENCE OF ULCERS (sore throat, sores in mouth, or a toothache) UNUSUAL RASH, SWELLING OR PAIN  UNUSUAL VAGINAL DISCHARGE OR ITCHING   Items with * indicate a potential emergency and should be followed up as soon as possible or go to the Emergency Department if any problems should occur.  Please show the CHEMOTHERAPY ALERT CARD or  IMMUNOTHERAPY ALERT CARD at check-in to the Emergency Department and triage nurse.  Should you have questions after your visit or need to cancel or reschedule your appointment, please contact [facility] - A DEPT OF [facility]  Dept: [phone]  and follow the prompts.  Office hours are 8:00 a.m. to 4:30 p.m. Monday - Friday. Please note that voicemails left after 4:00 p.m. may not be returned until the following business day.  We are closed weekends and major holidays. You have access to a nurse at all times for urgent questions. Please call the main number to the clinic Dept: [phone] and follow the prompts.   For any non-urgent questions, you may also contact your provider using MyChart. We now offer e-Visits for anyone 10 and older to request care online for non-urgent symptoms. For details visit [email or web address].   Also download the MyChart app! Go to the app store, search "MyChart", open the app, select [place], and log in with your MyChart username and password.  Zoledronic Acid Injection (Cancer) What is this medication? ZOLEDRONIC ACID (ZOE le dron ik AS id) treats high calcium levels in the blood caused by cancer. It may also be used with chemotherapy to treat weakened bones caused by cancer. It works by slowing down the release of calcium from bones. This lowers calcium levels in your blood. It also makes your bones stronger and less likely to break (fracture). It belongs to a group of medications called bisphosphonates. This medicine may be used for other purposes; ask your health care  provider or pharmacist if you have questions. COMMON BRAND NAME(S): Zometa, Zometa Powder What should I tell my care team before I take this medication? They need to know if you have any of these conditions: Dehydration Dental disease Kidney disease Liver disease Low levels of calcium in the blood Lung or breathing disease, such as asthma Receiving  steroids, such as dexamethasone or prednisone An unusual or allergic reaction to zoledronic acid, other medications, foods, dyes, or preservatives Pregnant or trying to get pregnant Breast-feeding How should I use this medication? This medication is injected into a vein. It is given by your care team in a hospital or clinic setting. Talk to your care team about the use of this medication in children. Special care may be needed. Overdosage: If you think you have taken too much of this medicine contact a poison control center or emergency room at once. NOTE: This medicine is only for you. Do not share this medicine with others. What if I miss a dose? Keep appointments for follow-up doses. It is important not to miss your dose. Call your care team if you are unable to keep an appointment. What may interact with this medication? Certain antibiotics given by injection Diuretics, such as bumetanide, furosemide NSAIDs, medications for pain and inflammation, such as ibuprofen or naproxen Teriparatide Thalidomide This list may not describe all possible interactions. Give your health care provider a list of all the medicines, herbs, non-prescription drugs, or dietary supplements you use. Also tell them if you smoke, drink alcohol, or use illegal drugs. Some items may interact with your medicine. What should I watch for while using this medication? Visit your care team for regular checks on your progress. It may be some time before you see the benefit from this medication. Some people who take this medication have severe bone, joint, or muscle pain. This medication may also increase your risk for jaw problems or a broken thigh bone. Tell your care team right away if you have severe pain in your jaw, bones, joints, or muscles. Tell you care team if you have any pain that does not go away or that gets worse. Tell your dentist and dental surgeon that you are taking this medication. You should not have major  dental surgery while on this medication. See your dentist to have a dental exam and fix any dental problems before starting this medication. Take good care of your teeth while on this medication. Make sure you see your dentist for regular follow-up appointments. You should make sure you get enough calcium and vitamin D while you are taking this medication. Discuss the foods you eat and the vitamins you take with your care team. Check with your care team if you have severe diarrhea, nausea, and vomiting, or if you sweat a lot. The loss of too much body fluid may make it dangerous for you to take this medication. You may need bloodwork while taking this medication. Talk to your care team if you wish to become pregnant or think you might be pregnant. This medication can cause serious birth defects. What side effects may I notice from receiving this medication? Side effects that you should report to your care team as soon as possible: Allergic reactions--skin rash, itching, hives, swelling of the face, lips, tongue, or throat Kidney injury--decrease in the amount of urine, swelling of the ankles, hands, or feet Low calcium level--muscle pain or cramps, confusion, tingling, or numbness in the hands or feet Osteonecrosis of the jaw--pain, swelling, or  redness in the mouth, numbness of the jaw, poor healing after dental work, unusual discharge from the mouth, visible bones in the mouth Severe bone, joint, or muscle pain Side effects that usually do not require medical attention (report to your care team if they continue or are bothersome): Constipation Fatigue Fever Loss of appetite Nausea Stomach pain This list may not describe all possible side effects. Call your doctor for medical advice about side effects. You may report side effects to FDA at 1-800-FDA-1088. Where should I keep my medication? This medication is given in a hospital or clinic. It will not be stored at home. NOTE: This sheet is a  summary. It may not cover all possible information. If you have questions about this medicine, talk to your doctor, pharmacist, or health care provider.  2024 Elsevier/Gold Standard (2021-06-16 00:00:00)

## 2024-02-14 NOTE — Assessment & Plan Note (Addendum)
 Drug monitoring for lenvatinib . Monitor for hypertension Baseline EKG normal Qtc.  Will obtain repeat EKG Labs every 3 weeks with CBC and CMP Continue TSH and free T4  urine dipstick; if 2+ then obtain a 24-hour urine protein Monitor for signs of bleeding, wound healing, thrombosis Monitor for irAEs

## 2024-02-14 NOTE — Assessment & Plan Note (Addendum)
 Continue levothyroxine  75 mcg once daily.  Will change dose if needed Continue to monitor fT4

## 2024-02-17 ENCOUNTER — Other Ambulatory Visit: Payer: Self-pay

## 2024-02-20 NOTE — Progress Notes (Signed)
 [facility] OFFICE PROGRESS NOTE  Patient Care Team: Duanne Butler DASEN, MD as PCP - General (Family Medicine)  83 y.o.w. 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   Diagnosis: stage IV clear cell RCC Treatment: Right nephrectomy. 04/26/23 started pembrolizumab . Delayed started of Lenvatinib  at 10 mg daily due to wound on 05/07/23.   Difficulty tolerating 20 mg of lenvatinib .  Will continue at 10 mg daily. 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  Next zometa  will be in December  At high risk for fracture Bone metastases Patient has no dentaI issues and teeth.  Started Zometa  3 mg every 3 months in Feb 2025 Continue zometa . Recommend patient stop mild vitamin D  [phone] IU and calcium  1200 mg daily Next Zometa  in Dec At risk for arrhythmia ECG showed Qtc 465 ms.  Orders Placed This Encounter  Procedures   EKG 12-Lead   EKG 12-Lead    Ordered by TINA Pauletta JAYSON TINA, MD  INTERVAL HISTORY: Patient returns for follow-up.  She took lenvatinib  20 mg on Wednesday.  She did not feel well on Thursday with generalized unwellness.  No nausea, vomiting, abdominal pain, diarrhea.  She feels like she has to go to the emergency room.  She did skip a dose yesterday.  Oncology History  History of breast cancer  06/11/2011 Surgery   Right breast mastectomy with right axillary sentinel lymph node biopsy 1.3 cm invasive lobular cancer, grade 1, atypical lobular hyperplasia, focal LV I, ER 91%, PR 25%,  HER-2 negative, 0/8 lymph nodes T1 cN0 stage IA   07/25/2011 -  Anti-estrogen oral therapy   Letrozole  2.5 mg daily 7 years was a plan   Metastatic renal cell carcinoma to bone (HCC)  02/01/2023 Initial Diagnosis   Renal cell carcinoma (HCC)   02/05/2023 Imaging   Bone scan: No evidence of osseous metastatic disease.    04/03/2023 Pathology Results   A. 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 There are several residual lung nodules identified.   Known bone metastases   No developing new mass lesion or nodal enlargement.   Gallbladder full of stones and is distended, similar to previous.   Pelvic prolapse identified of the anterior posterior compartments.   Metallic foci along the lumen of the cecum are linear and have appearance of clips. Please correlate for ingested material  or intervention.   08/02/2023 Imaging   MRI Brain 1. Negative for brain metastasis. 2. Newly enhancing 9 mm lesion in the high left parietal bone, possible early bony metastasis   11/29/2023 Imaging   CT CAP IMPRESSION: 1. Status post right nephrectomy and adrenalectomy. No suspicious soft tissue or contrast enhancement in the nephrectomy bed. 2. Unchanged pulmonary metastases. 3. Unchanged osseous metastases. 4. No evidence of new metastatic disease in the  chest, abdomen, or pelvis. 5. Cholelithiasis.  Lungs/Pleura:  Unchanged nodule of the posterior right pulmonary apex measuring 1.8 x 1.3 cm (series 4, image 27).  Unchanged nodule of the paramedian right upper lobe measuring 1.0 x 0.6 cm (series 4, image 29).  Unchanged nodule of the dependent left lower lobe measuring 0.4 cm (series 4, image 70).  Unchanged subpleural nodule of the dependent left lower lobe measuring 1.2 x 0.6 cm (series 4, image 87). No pleural effusion or pneumothorax.   Unchanged mixed lytic and sclerotic metastasis of T3 (series 6, image 105). Callused fracture deformity of the lateral right eighth rib with interval healing (series 2, image 39). Unchanged small rim sclerotic metastasis of S1 (series 6, image 105) Unchanged faintly lytic metastasis of the left ilium (series 2, image 89).   11/29/2023 Imaging   CT: stable disease MRI brain: no new disease.   NSCLC of left lung (HCC)  03/18/2023 Initial Diagnosis   NSCLC of left lung (HCC)   06/07/2023 Cancer Staging   Staging form: Lung, AJCC 8th Edition - Clinical: cT1, cN1, cM0 - Signed by Tina Pauletta BROCKS, MD on 06/07/2023 Stage prefix: Initial diagnosis Histologic grade (G): GX Histologic grading system: 4 grade system      PHYSICAL EXAMINATION: ECOG PERFORMANCE STATUS: 1 - Symptomatic but completely ambulatory  Vitals:   02/21/24 0901  BP: 131/85  Pulse: 91  Resp: 20  Temp: (!) 97.5 F (36.4 C)  SpO2: 96%   Filed Weights   02/21/24 0901  Weight: 196 lb 14.4 oz (89.3 kg)   GENERAL: alert, no distress and comfortable LUNGS: clear to auscultation and percussion with normal breathing effort HEART: regular rate & rhythm  ABDOMEN: abdomen soft, non-tender and nondistended. Musculoskeletal: baseline bilateral ankle edema   Relevant data reviewed during this visit included labs.

## 2024-02-20 NOTE — Assessment & Plan Note (Addendum)
 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  Next zometa  will be in December

## 2024-02-20 NOTE — Assessment & Plan Note (Addendum)
 Bone metastases Patient has no dentaI issues and teeth.  Started Zometa  3 mg every 3 months in Feb 2025 Continue zometa . Recommend patient stop mild vitamin D  [phone] IU and calcium  1200 mg daily Next Zometa  in Dec

## 2024-02-21 ENCOUNTER — Inpatient Hospital Stay

## 2024-02-21 VITALS — BP 131/85 | HR 91 | Temp 97.5°F | Resp 20 | Wt 196.9 lb

## 2024-02-21 DIAGNOSIS — Z905 Acquired absence of kidney: Secondary | ICD-10-CM | POA: Diagnosis not present

## 2024-02-21 DIAGNOSIS — C649 Malignant neoplasm of unspecified kidney, except renal pelvis: Secondary | ICD-10-CM | POA: Diagnosis not present

## 2024-02-21 DIAGNOSIS — C7951 Secondary malignant neoplasm of bone: Secondary | ICD-10-CM | POA: Diagnosis not present

## 2024-02-21 DIAGNOSIS — Z5112 Encounter for antineoplastic immunotherapy: Secondary | ICD-10-CM | POA: Diagnosis not present

## 2024-02-21 DIAGNOSIS — C7801 Secondary malignant neoplasm of right lung: Secondary | ICD-10-CM | POA: Diagnosis not present

## 2024-02-21 DIAGNOSIS — Z79620 Long term (current) use of immunosuppressive biologic: Secondary | ICD-10-CM | POA: Diagnosis not present

## 2024-02-21 DIAGNOSIS — E039 Hypothyroidism, unspecified: Secondary | ICD-10-CM | POA: Diagnosis not present

## 2024-02-21 DIAGNOSIS — C7802 Secondary malignant neoplasm of left lung: Secondary | ICD-10-CM | POA: Diagnosis not present

## 2024-02-21 DIAGNOSIS — Z923 Personal history of irradiation: Secondary | ICD-10-CM | POA: Diagnosis not present

## 2024-02-21 DIAGNOSIS — N1832 Chronic kidney disease, stage 3b: Secondary | ICD-10-CM | POA: Diagnosis not present

## 2024-02-21 DIAGNOSIS — Z853 Personal history of malignant neoplasm of breast: Secondary | ICD-10-CM | POA: Diagnosis not present

## 2024-02-21 DIAGNOSIS — Z79890 Hormone replacement therapy (postmenopausal): Secondary | ICD-10-CM | POA: Diagnosis not present

## 2024-02-21 DIAGNOSIS — C641 Malignant neoplasm of right kidney, except renal pelvis: Secondary | ICD-10-CM | POA: Diagnosis not present

## 2024-02-21 DIAGNOSIS — C3412 Malignant neoplasm of upper lobe, left bronchus or lung: Secondary | ICD-10-CM | POA: Diagnosis not present

## 2024-02-21 DIAGNOSIS — Z9189 Other specified personal risk factors, not elsewhere classified: Secondary | ICD-10-CM | POA: Diagnosis not present

## 2024-02-21 DIAGNOSIS — Z8673 Personal history of transient ischemic attack (TIA), and cerebral infarction without residual deficits: Secondary | ICD-10-CM | POA: Diagnosis not present

## 2024-02-23 ENCOUNTER — Other Ambulatory Visit: Payer: Self-pay

## 2024-02-25 ENCOUNTER — Other Ambulatory Visit: Payer: Self-pay

## 2024-02-27 ENCOUNTER — Other Ambulatory Visit: Payer: Self-pay

## 2024-02-27 NOTE — Progress Notes (Signed)
 Specialty Pharmacy Ongoing Clinical Assessment Note  Diane Paul is a 83 y.o. female who is being followed by the specialty pharmacy service for RxSp Oncology   Patient's specialty medication(s) reviewed today: Lenvatinib  Mesylate (LENVIMA )   Missed doses in the last 4 weeks: 1 (instructed by provider to hold for one day and then to decrease the dose to 10 mg daily due to side effects)   Patient/Caregiver did not have any additional questions or concerns.   Therapeutic benefit summary: Patient is achieving benefit (restaging CT ordered for November)   Adverse events/side effects summary: No adverse events/side effects (patient could not tolerate the 20 mg daily dose (generally felt very unwell); however, she is experiencing no side effects on the 10 mg daily dose)   Patient's therapy is appropriate to: Continue    Goals Addressed             This Visit's Progress    Maintain optimal adherence to therapy   On track    Patient is on track. Patient will maintain adherence          Follow up: 6 months  Silvano LOISE Dolly Specialty Pharmacist

## 2024-03-06 ENCOUNTER — Inpatient Hospital Stay

## 2024-03-06 ENCOUNTER — Inpatient Hospital Stay: Admitting: Physician Assistant

## 2024-03-06 VITALS — BP 150/81 | HR 91 | Temp 97.5°F | Resp 17 | Wt 199.7 lb

## 2024-03-06 DIAGNOSIS — C7951 Secondary malignant neoplasm of bone: Secondary | ICD-10-CM | POA: Diagnosis not present

## 2024-03-06 DIAGNOSIS — C649 Malignant neoplasm of unspecified kidney, except renal pelvis: Secondary | ICD-10-CM | POA: Diagnosis not present

## 2024-03-06 DIAGNOSIS — Z5112 Encounter for antineoplastic immunotherapy: Secondary | ICD-10-CM

## 2024-03-06 DIAGNOSIS — E039 Hypothyroidism, unspecified: Secondary | ICD-10-CM | POA: Diagnosis not present

## 2024-03-06 LAB — CMP (CANCER CENTER ONLY)
ALT: 8 U/L (ref 0–44)
AST: 11 U/L — ABNORMAL LOW (ref 15–41)
Albumin: 3.6 g/dL (ref 3.5–5.0)
Alkaline Phosphatase: 46 U/L (ref 38–126)
Anion gap: 7 (ref 5–15)
BUN: 25 mg/dL — ABNORMAL HIGH (ref 8–23)
CO2: 28 mmol/L (ref 22–32)
Calcium: 8.6 mg/dL — ABNORMAL LOW (ref 8.9–10.3)
Chloride: 105 mmol/L (ref 98–111)
Creatinine: 1.33 mg/dL — ABNORMAL HIGH (ref 0.44–1.00)
GFR, Estimated: 40 mL/min — ABNORMAL LOW (ref >60)
Glucose, Bld: 95 mg/dL (ref 70–99)
Potassium: 4 mmol/L (ref 3.5–5.1)
Sodium: 140 mmol/L (ref 135–145)
Total Bilirubin: 0.3 mg/dL (ref 0.0–1.2)
Total Protein: 7.1 g/dL (ref 6.5–8.1)

## 2024-03-06 LAB — CBC WITH DIFFERENTIAL (CANCER CENTER ONLY)
Abs Immature Granulocytes: 0.03 K/uL (ref 0.00–0.07)
Basophils Absolute: 0 K/uL (ref 0.0–0.1)
Basophils Relative: 0 %
Eosinophils Absolute: 0.2 K/uL (ref 0.0–0.5)
Eosinophils Relative: 2 %
HCT: 33 % — ABNORMAL LOW (ref 36.0–46.0)
Hemoglobin: 10.4 g/dL — ABNORMAL LOW (ref 12.0–15.0)
Immature Granulocytes: 0 %
Lymphocytes Relative: 21 %
Lymphs Abs: 1.5 K/uL (ref 0.7–4.0)
MCH: 26.9 pg (ref 26.0–34.0)
MCHC: 31.5 g/dL (ref 30.0–36.0)
MCV: 85.5 fL (ref 80.0–100.0)
Monocytes Absolute: 0.7 K/uL (ref 0.1–1.0)
Monocytes Relative: 10 %
Neutro Abs: 4.5 K/uL (ref 1.7–7.7)
Neutrophils Relative %: 67 %
Platelet Count: 234 K/uL (ref 150–400)
RBC: 3.86 MIL/uL — ABNORMAL LOW (ref 3.87–5.11)
RDW: 13.7 % (ref 11.5–15.5)
WBC Count: 6.9 K/uL (ref 4.0–10.5)
nRBC: 0 % (ref 0.0–0.2)

## 2024-03-06 LAB — MAGNESIUM: Magnesium: 2 mg/dL (ref 1.7–2.4)

## 2024-03-06 LAB — T4, FREE: Free T4: 0.95 ng/dL (ref 0.61–1.12)

## 2024-03-06 LAB — TOTAL PROTEIN, URINE DIPSTICK: Protein, ur: NEGATIVE mg/dL

## 2024-03-06 LAB — LACTATE DEHYDROGENASE: LDH: 178 U/L (ref 98–192)

## 2024-03-06 MED ORDER — HEPARIN SOD (PORK) LOCK FLUSH 100 UNIT/ML IV SOLN: 250.0000 [IU] | Freq: Once | INTRAVENOUS | Status: DC | PRN

## 2024-03-06 MED ORDER — SODIUM CHLORIDE 0.9 % IV SOLN: INTRAVENOUS | Status: DC

## 2024-03-06 MED ORDER — SODIUM CHLORIDE 0.9 % IV SOLN
200.0000 mg | Freq: Once | INTRAVENOUS | Status: AC
  Administered 2024-03-06: 200 mg via INTRAVENOUS
  Filled 2024-03-06: qty 200

## 2024-03-06 NOTE — Progress Notes (Signed)
 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   Oncology History  History of breast cancer  06/11/2011 Surgery   Right breast mastectomy with right axillary sentinel lymph node biopsy 1.3 cm invasive lobular cancer, grade 1, atypical lobular hyperplasia, focal LV I, ER 91%, PR 25%, HER-2 negative, 0/8 lymph nodes T1 cN0 stage IA   07/25/2011 -  Anti-estrogen oral therapy   Letrozole  2.5 mg daily 7 years was a plan   Metastatic renal cell carcinoma to bone (HCC)  02/01/2023 Initial Diagnosis   Renal cell carcinoma (HCC)   02/05/2023 Imaging   Bone scan: No evidence of osseous metastatic disease.    04/03/2023 Pathology Results   A. 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 There are several residual lung nodules identified.   Known bone metastases   No developing new mass lesion or nodal enlargement.   Gallbladder full of stones and is distended, similar to previous.   Pelvic prolapse identified of the anterior posterior compartments.   Metallic foci along the lumen of the cecum are linear and have appearance of clips.  Please correlate for ingested material or intervention.   08/02/2023 Imaging   MRI Brain 1. Negative for brain metastasis. 2. Newly enhancing 9 mm lesion in the high left parietal bone, possible early bony metastasis   11/29/2023 Imaging   CT CAP IMPRESSION: 1. Status post right nephrectomy and adrenalectomy. No suspicious soft tissue or contrast enhancement in the nephrectomy bed. 2. Unchanged pulmonary metastases. 3. Unchanged osseous metastases. 4. No evidence of new metastatic disease in the chest, abdomen, or pelvis. 5. Cholelithiasis.  Lungs/Pleura:  Unchanged nodule of the posterior right pulmonary apex measuring 1.8 x 1.3 cm (series 4, image 27).  Unchanged nodule of the paramedian right upper lobe measuring 1.0 x 0.6 cm (series 4, image 29).  Unchanged nodule of the dependent left lower lobe measuring 0.4 cm (series 4, image 70).  Unchanged subpleural nodule of the dependent left lower lobe measuring 1.2 x 0.6 cm (series 4, image 87). No pleural effusion or pneumothorax.   Unchanged mixed lytic and sclerotic metastasis of T3 (series 6, image 105). Callused fracture deformity of the lateral right eighth rib with interval healing (series 2, image 39). Unchanged small rim sclerotic metastasis of S1 (series 6, image 105) Unchanged faintly lytic metastasis of the left ilium (series 2, image 89).   11/29/2023 Imaging   CT: stable disease MRI brain: no new disease.   NSCLC of left lung (HCC)  03/18/2023 Initial Diagnosis   NSCLC of left lung (HCC)   06/07/2023 Cancer Staging   Staging form: Lung, AJCC 8th  Edition - Clinical: cT1, cN1, cM0 - Signed by Tina Pauletta BROCKS, MD on 06/07/2023 Stage prefix: Initial diagnosis Histologic grade (G): GX Histologic grading system: 4 grade system    CURRENT TREATMENT: Pembrolizumab  and Lenvatinib .    HISTORY OF PRESENTING ILLNESS:  Diane Paul 83 y.o. female returns for follow-up prior to cycle 16, day 1 of pembrolizumab  plus  lenvatinib  therapy.  Ms. Blacklock was seen by Dr. Tina on 02/21/2024.  In the interim she denies any changes to her health.  Ms. Antony reports she is tolerating treatment without any new or concerning side effects. She reports having some congestions secondary to allergies. She reports her energy and appetite are unchanged. She denies nausea, vomiting or bowel habit changes.  She denies easy bruising or signs of active bleeding.  She denies fevers, chills, night sweats, shortness of breath, chest pain or cough.  She has no other complaints.  Rest of the 10 point ROS as below.    MEDICAL HISTORY:  Past Medical History:  Diagnosis Date   Anemia    Anxiety    nervous sometimes, denies panica ttacks    Arthritis    knees ?   Blood transfusion    post vaginal birth- 1960, 01/2023- anemia   Breast cancer (HCC)    Right Breast Cancer   Cancer (HCC)    R breast cancer   Dry mouth    GERD (gastroesophageal reflux disease)    Hypertension    Neuromuscular disorder (HCC)    L leg, thigh- burns sometimes   Shortness of breath    sometimes    Use of letrozole  (Femara ) 05/07/2010   neoadjuvant femara  therapy since 1/212    SURGICAL HISTORY: Past Surgical History:  Procedure Laterality Date   ABDOMINAL HYSTERECTOMY     BIOPSY  02/03/2023   Procedure: BIOPSY;  Surgeon: Rollin Dover, MD;  Location: [facility] ENDOSCOPY;  Service: Gastroenterology;;  gastric   BREAST LUMPECTOMY     BREAST SURGERY     Right   BRONCHIAL BIOPSY  02/12/2023   Procedure: BRONCHIAL BIOPSIES;  Surgeon: Shelah Lamar RAMAN, MD;  Location[facility] ENDOSCOPY;  Service: Pulmonary;;   BRONCHIAL BIOPSY  03/11/2023   Procedure: BRONCHIAL BIOPSIES;  Surgeon: Shelah Lamar RAMAN, MD;  Location: [facility] ENDOSCOPY;  Service: Pulmonary;;   BRONCHIAL BRUSHINGS  02/12/2023   Procedure: BRONCHIAL BRUSHINGS;  Surgeon: Shelah Lamar RAMAN, MD;  Location: [facility] ENDOSCOPY;  Service: Pulmonary;;   BRONCHIAL BRUSHINGS  03/11/2023   Procedure: BRONCHIAL BRUSHINGS;   Surgeon: Shelah Lamar RAMAN, MD;  Location: [facility] ENDOSCOPY;  Service: Pulmonary;;   BRONCHIAL NEEDLE ASPIRATION BIOPSY  02/12/2023   Procedure: BRONCHIAL NEEDLE ASPIRATION BIOPSIES;  Surgeon: Shelah Lamar RAMAN, MD;  Location: [facility] ENDOSCOPY;  Service: Pulmonary;;   BRONCHIAL NEEDLE ASPIRATION BIOPSY  03/11/2023   Procedure: BRONCHIAL NEEDLE ASPIRATION BIOPSIES;  Surgeon: Shelah Lamar RAMAN, MD;  Location: [facility] ENDOSCOPY;  Service: Pulmonary;;   BRONCHIAL WASHINGS  02/12/2023   Procedure: BRONCHIAL WASHINGS;  Surgeon: Shelah Lamar RAMAN, MD;  Location: [facility] ENDOSCOPY;  Service: Pulmonary;;   COLONOSCOPY WITH PROPOFOL  N/A 02/03/2023   Procedure: COLONOSCOPY WITH PROPOFOL ;  Surgeon: Rollin Dover, MD;  Location: [facility] ENDOSCOPY;  Service: Gastroenterology;  Laterality: N/A;   ESOPHAGOGASTRODUODENOSCOPY (EGD) WITH PROPOFOL  N/A 02/03/2023   Procedure: ESOPHAGOGASTRODUODENOSCOPY (EGD) WITH PROPOFOL ;  Surgeon: Rollin Dover, MD;  Location: [facility] ENDOSCOPY;  Service: Gastroenterology;  Laterality: N/A;   HEMOSTASIS CLIP PLACEMENT  02/03/2023   Procedure: HEMOSTASIS CLIP PLACEMENT;  Surgeon: Rollin Dover, MD;  Location: [facility]  ENDOSCOPY;  Service: Gastroenterology;;  ascending colon polyp   IR IMAGING GUIDED PORT INSERTION  04/30/2023   jp drains     s/p masectomy 06/11/11/ Dr. Gail Favorite, 2 jp drains right chest wall   MASTECTOMY Right    malignant   MASTECTOMY W/ SENTINEL NODE BIOPSY  06/11/2011/Right BReast   Procedure: MASTECTOMY WITH SENTINEL LYMPH NODE BIOPSY;  Surgeon: Favorite CHRISTELLA Gail, MD;  Location: [facility] OR;  Service: General;  Laterality: Right;  Right total mastectomy and sentinel lymph node biopsy using lymphatic mapping and blue dye injection.   POLYPECTOMY  02/03/2023   Procedure: POLYPECTOMY;  Surgeon: Rollin Dover, MD;  Location: [facility] ENDOSCOPY;  Service: Gastroenterology;;  hot snare polypectomy ascending colon   ROBOT ASSISTED LAPAROSCOPIC NEPHRECTOMY Right 04/03/2023   Procedure: XI ROBOTIC ASSISTED RIGHT LAPAROSCOPIC  RADICAL NEPHRECTOMY;  Surgeon: Alvaro Ricardo KATHEE Mickey., MD;  Location: [facility] ORS;  Service: Urology;  Laterality: Right;  180 MINUTES   SUBMUCOSAL LIFTING INJECTION  02/03/2023   Procedure: SUBMUCOSAL LIFTING INJECTION;  Surgeon: Rollin Dover, MD;  Location: [facility] ENDOSCOPY;  Service: Gastroenterology;;  ever lift 16cc ascending colon   TUBAL LIGATION     US  FINE NEEDLE ASPIRATION WO/ W SMEAR  05/22/10   Left Breast- cyst or abscess     SOCIAL HISTORY: Social History   Socioeconomic History   Marital status: Single    Spouse name: Not on file   Number of children: 5   Years of education: Not on file   Highest education level: Not on file  Occupational History   Occupation: Retired  Tobacco Use   Smoking status: Never   Smokeless tobacco: Never  Vaping Use   Vaping status: Never Used  Substance and Sexual Activity   Alcohol use: No   Drug use: No   Sexual activity: Not Currently    Birth control/protection: Post-menopausal  Other Topics Concern   Not on file  Social History Narrative   Not on file   Social Drivers of Health   Financial Resource Strain: Low Risk  (03/25/2023)   Overall Financial Resource Strain (CARDIA)    Difficulty of Paying Living Expenses: Not hard at all  Food Insecurity: No Food Insecurity (08/20/2023)   Hunger Vital Sign    Worried About Running Out of Food in the Last Year: Never true    Ran Out of Food in the Last Year: Never true  Transportation Needs: No Transportation Needs (08/20/2023)   PRAPARE - Administrator, Civil Service (Medical): No    Lack of Transportation (Non-Medical): No  Physical Activity: Sufficiently Active (07/25/2022)   Exercise Vital Sign    Days of Exercise per Week: 7 days    Minutes of Exercise per Session: 150+ min  Stress: No Stress Concern Present (07/25/2022)   [employer] of Occupational Health - Occupational Stress Questionnaire    Feeling of Stress : Not at all  Social Connections: Socially Isolated  (07/25/2022)   Social Connection and Isolation Panel    Frequency of Communication with Friends and Family: More than three times a week    Frequency of Social Gatherings with Friends and Family: More than three times a week    Attends Religious Services: Never    Database Administrator or Organizations: No    Attends Banker Meetings: Never    Marital Status: Divorced  Catering Manager Violence: Not At Risk (04/08/2023)   Humiliation, Afraid, Rape, and Kick questionnaire    Fear of Current or  Ex-Partner: No    Emotionally Abused: No    Physically Abused: No    Sexually Abused: No    FAMILY HISTORY: Family History  Problem Relation Age of Onset   Breast cancer Sister    Anesthesia problems Neg Hx    Hypotension Neg Hx    Malignant hyperthermia Neg Hx    Pseudochol deficiency Neg Hx     ALLERGIES:  is allergic to fosamax  [alendronate  sodium].  MEDICATIONS:  Current Outpatient Medications  Medication Sig Dispense Refill   Calcium  Carb-Cholecalciferol (CALCIUM -VITAMIN D  PO) Take by mouth.     cephALEXin  (KEFLEX ) 500 MG capsule Take 1 capsule (500 mg total) by mouth 3 (three) times daily. 30 capsule 0   diphenhydrAMINE  (BENADRYL ) 25 mg capsule Take 25 mg by mouth every 6 (six) hours as needed for itching.     Ensure (ENSURE) Take 237 mLs by mouth 2 (two) times daily between meals.     lenvatinib  10 mg daily dose (LENVIMA ) capsule Take 2 capsules (20 mg total) by mouth daily. 60 capsule 0   levothyroxine  (SYNTHROID ) 75 MCG tablet Take 1 tablet (75 mcg total) by mouth daily before breakfast. 90 tablet 0   lidocaine -prilocaine  (EMLA ) cream Apply 1 Application topically as needed. Apply to port ~ 1 hour prior to access 30 g 0   ondansetron  (ZOFRAN ) 8 MG tablet Take 1 tablet (8 mg total) by mouth every 8 (eight) hours as needed for nausea or vomiting. 30 tablet 1   prochlorperazine  (COMPAZINE ) 10 MG tablet Take 1 tablet (10 mg total) by mouth every 6 (six) hours as needed for  nausea or vomiting. 30 tablet 1   senna-docusate (SENOKOT-S) 8.6-50 MG tablet Take 1 tablet by mouth 2 (two) times daily. While taking strong pain meds to prevent constipation 10 tablet 0   No current facility-administered medications for this visit.    REVIEW OF SYSTEMS:   Constitutional: ( - ) fevers, ( - )  chills , ( - ) night sweats Eyes: ( - ) blurriness of vision, ( - ) double vision, ( - ) watery eyes Ears, nose, mouth, throat, and face: ( - ) mucositis, ( - ) sore throat Respiratory: ( - ) cough, ( - ) dyspnea, ( - ) wheezes Cardiovascular: ( - ) palpitation, ( - ) chest discomfort, ( - ) lower extremity swelling Gastrointestinal:  ( - ) nausea, ( - ) heartburn, ( - ) change in bowel habits Skin: ( - ) abnormal skin rashes Lymphatics: ( - ) new lymphadenopathy, ( - ) easy bruising Neurological: ( - ) numbness, ( - ) tingling, ( - ) new weaknesses Behavioral/Psych: ( - ) mood change, ( - ) new changes  All other systems were reviewed with the patient and are negative.  PHYSICAL EXAMINATION: ECOG PERFORMANCE STATUS: 1 - Symptomatic but completely ambulatory  Vitals:   03/06/24 1046  BP: (!) 150/81  Pulse: 91  Resp: 17  Temp: (!) 97.5 F (36.4 C)  SpO2: 98%   Filed Weights   03/06/24 1046  Weight: 199 lb 11.2 oz (90.6 kg)    GENERAL: well appearing female in NAD  SKIN: skin color, texture, turgor are normal, no rashes or significant lesions EYES: conjunctiva are pink and non-injected, sclera clear OROPHARYNX: no exudate, no erythema; lips, buccal mucosa, and tongue normal  LUNGS: clear to auscultation and percussion with normal breathing effort HEART: regular rate & rhythm and no murmurs.  Musculoskeletal: no cyanosis of digits and no clubbing  PSYCH: alert & oriented x 3, fluent speech NEURO: no focal motor/sensory deficits  LABORATORY DATA:  I have reviewed the data as listed    Latest Ref Rng & Units 03/06/2024   10:16 AM 02/14/2024   12:57 PM 01/24/2024     9:16 AM  CBC  WBC 4.0 - 10.5 K/uL 6.9  6.6  5.6   Hemoglobin 12.0 - 15.0 g/dL 89.5  88.1  88.5   Hematocrit 36.0 - 46.0 % 33.0  38.0  36.4   Platelets 150 - 400 K/uL 234  292  222        Latest Ref Rng & Units 02/14/2024   12:57 PM 01/24/2024    9:16 AM 01/02/2024    1:41 PM  CMP  Glucose 70 - 99 mg/dL 878  97  98   BUN 8 - 23 mg/dL 26  24  22    Creatinine 0.44 - 1.00 mg/dL 8.69  8.46  8.62   Sodium 135 - 145 mmol/L 137  140  140   Potassium 3.5 - 5.1 mmol/L 4.5  4.1  4.2   Chloride 98 - 111 mmol/L 105  105  106   CO2 22 - 32 mmol/L 27  29  29    Calcium  8.9 - 10.3 mg/dL 9.3  8.7  8.8   Total Protein 6.5 - 8.1 g/dL 7.5  7.3  7.0   Total Bilirubin 0.0 - 1.2 mg/dL 0.3  0.3  0.3   Alkaline Phos 38 - 126 U/L 50  51  47   AST 15 - 41 U/L 12  13  14    ALT 0 - 44 U/L 9  8  9      RADIOGRAPHIC STUDIES: I have personally reviewed the radiological images as listed and agreed with the findings in the report. No results found.   ASSESSMENT & PLAN RHETTA CLEEK is a 83 y.o. female who presents for continued management of metastatic clear-cell RCC.  #Stage IV Clear Cell RCC involving the lungs and bones: --S/p right nephrectomy. Started pembrolizumab  on 04/26/23. Delayed started of Lenvatinib  at 10 mg daily due to wound on 05/07/23. --09/03/23-09/13/23 Completed radiation of oliogoprogression of left parietal bone --Most recent CT CAP from 11/29/2023 shows stable disease. Next restaging CT scan scheduled for 03/13/2024.  PLAN: -- Due for cycle 16, day 1 of pembrolizumab  today. --Labs from today were reviewed and adequate for treatment.  WBC 6.9, Hgb 10.4, MCV 85.5, Plt 234, creatinine stable at 1.33, LFTs in range.  --Proceed with treatment today without any dose modifications. --RTC in 3 weeks with labs and follow-up prior to cycle 17, day 1  # Acquired hypothyroidism: -- Secondary to immunotherapy, currently on levothyroxine  75 mcg daily. Free T4 normal today.   #Risk for fracture 2/2  bone metastases: --Started Zometa  q 12 weeks in February 2025, next dose due December 2025.   No orders of the defined types were placed in this encounter.   All questions were answered. The patient knows to call the clinic with any problems, questions or concerns.  I have spent a total of 30 minutes minutes of face-to-face and non-face-to-face time, preparing to see the patient,performing a medically appropriate examination, counseling and educating the patient, documenting clinical information in the electronic health record, independently interpreting results and communicating results to the patient, and care coordination.   Johnston Police, PA-C Department of Hematology/Oncology [facility] Phone: [phone]

## 2024-03-11 ENCOUNTER — Other Ambulatory Visit: Payer: Self-pay

## 2024-03-11 DIAGNOSIS — C649 Malignant neoplasm of unspecified kidney, except renal pelvis: Secondary | ICD-10-CM

## 2024-03-12 ENCOUNTER — Other Ambulatory Visit (HOSPITAL_COMMUNITY): Payer: Self-pay

## 2024-03-12 ENCOUNTER — Other Ambulatory Visit: Payer: Self-pay

## 2024-03-12 MED ORDER — LENVATINIB (10 MG DAILY DOSE) 10 MG PO CPPK
20.0000 mg | ORAL_CAPSULE | Freq: Every day | ORAL | 0 refills | Status: DC
  Filled 2024-03-12 – 2024-04-08 (×4): qty 60, 30d supply, fill #0

## 2024-03-13 ENCOUNTER — Other Ambulatory Visit (HOSPITAL_COMMUNITY): Payer: Self-pay

## 2024-03-13 ENCOUNTER — Ambulatory Visit (HOSPITAL_COMMUNITY)
Admission: RE | Admit: 2024-03-13 | Discharge: 2024-03-13 | Disposition: A | Discharge status: Home / Self Care | Source: Ambulatory Visit

## 2024-03-13 ENCOUNTER — Other Ambulatory Visit: Payer: Self-pay

## 2024-03-13 DIAGNOSIS — R918 Other nonspecific abnormal finding of lung field: Secondary | ICD-10-CM | POA: Diagnosis not present

## 2024-03-13 DIAGNOSIS — I700 Atherosclerosis of aorta: Secondary | ICD-10-CM | POA: Diagnosis not present

## 2024-03-13 DIAGNOSIS — C649 Malignant neoplasm of unspecified kidney, except renal pelvis: Secondary | ICD-10-CM | POA: Diagnosis not present

## 2024-03-13 DIAGNOSIS — C784 Secondary malignant neoplasm of small intestine: Secondary | ICD-10-CM | POA: Diagnosis not present

## 2024-03-13 DIAGNOSIS — C7951 Secondary malignant neoplasm of bone: Secondary | ICD-10-CM | POA: Diagnosis not present

## 2024-03-13 MED ORDER — IOHEXOL 300 MG/ML  SOLN
80.0000 mL | Freq: Once | INTRAMUSCULAR | Status: AC | PRN
  Administered 2024-03-13: 80 mL via INTRAVENOUS

## 2024-03-13 MED ORDER — HEPARIN SOD (PORK) LOCK FLUSH 100 UNIT/ML IV SOLN
500.0000 [IU] | Freq: Once | INTRAVENOUS | Status: AC
  Administered 2024-03-13: 500 [IU] via INTRAVENOUS

## 2024-03-16 ENCOUNTER — Other Ambulatory Visit: Payer: Self-pay

## 2024-03-17 ENCOUNTER — Ambulatory Visit: Payer: Self-pay

## 2024-03-18 ENCOUNTER — Other Ambulatory Visit: Payer: Self-pay

## 2024-03-26 NOTE — Assessment & Plan Note (Addendum)
 Continue to increase fluid intake Monitor with renal function every cycle

## 2024-03-26 NOTE — Progress Notes (Signed)
 [facility] OFFICE PROGRESS NOTE  Patient Care Team: Duanne Butler DASEN, MD as PCP - General (Family Medicine)  83 y.o.w. 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   Diagnosis: stage IV clear cell RCC Treatment: Right nephrectomy. 04/26/23 started pembrolizumab . Delayed started of Lenvatinib  at 10 mg daily due to wound on 05/07/23.   Stable disease. Will continue len/pembro.  Will obtain bone scan.  Discussed result from CT. She only has a few oliogometastasis and has dramatic improvement of PS since diagnosis last year. Consider SBRT to oliogometastases. Will referr for Radiation oncology evaluation. Assessment & Plan Metastatic renal cell carcinoma to bone (HCC) Continue lenvatinib ; started on 05/07/23 Continue with pembrolizumab  Cannot tolerate lenvatinib  at 20 mg daily so will continue at 10 mg daily   Vit D 1000 international units with Calcium  1200 mg daily 09/03/23-09/13/23 Completed radiation of oliogoprogression of left parietal bone Restaging CT in Nov showed stable disease Continue zometa .  Next zometa  will be in December  Stage 3a chronic kidney disease (HCC) Continue to increase fluid intake Monitor with renal function every cycle At high risk for fracture Bone metastases Patient has no dentaI issues and teeth.  Started Zometa  3 mg every 3 months in Feb 2025 Continue zometa . Recommend patient stop mild vitamin D  [phone] IU and calcium  1200 mg daily Next Zometa  in Dec Acquired hypothyroidism Continue levothyroxine  75 mcg once daily.  Will change dose if needed Continue to monitor fT4 Normocytic anemia Borderline microcytosis.  Will obtain ferritin, B12 if able  Orders Placed This Encounter  Procedures   NM Bone Scan Whole Body    Standing Status:   Future    Expected Date:   04/03/2024    Expiration Date:   03/27/2025     If indicated for the ordered procedure, I authorize the administration of a radiopharmaceutical per Radiology protocol:   Yes    Preferred imaging location?:   [facility]   Folate    Standing Status:   Future    Expected Date:   04/17/2024    Expiration Date:   04/17/2025   Vitamin B12    Standing Status:   Future    Expected Date:   04/17/2024    Expiration Date:   04/17/2025   Lactate dehydrogenase    Standing Status:   Future    Expected Date:   05/29/2024    Expiration Date:   05/29/2025   Magnesium     Standing Status:   Future    Expected Date:   05/29/2024    Expiration Date:   05/29/2025   Total Protein, Urine dipstick    Standing Status:   Future    Expected Date:   05/29/2024    Expiration Date:   05/29/2025   CBC with Differential (Cancer Center Only)    Standing Status:   Future    Expected Date:   05/29/2024    Expiration Date:   05/29/2025   CMP (Cancer Center only)    Standing Status:   Future    Expected Date:   05/29/2024    Expiration Date:   05/29/2025   T4, free    Standing Status:   Future    Expected Date:   05/29/2024    Expiration Date:   05/29/2025   TSH    Standing Status:   Future  Expected Date:   05/29/2024    Expiration Date:   05/29/2025   Lactate dehydrogenase    Standing Status:   Future    Expected Date:   06/19/2024    Expiration Date:   06/19/2025   Magnesium     Standing Status:   Future    Expected Date:   06/19/2024    Expiration Date:   06/19/2025   Total Protein, Urine dipstick    Standing Status:   Future    Expected Date:   06/19/2024    Expiration Date:   06/19/2025   CBC with Differential (Cancer Center Only)    Standing Status:   Future    Expected Date:   06/19/2024    Expiration Date:   06/19/2025   CMP (Cancer Center only)    Standing Status:   Future    Expected Date:   06/19/2024    Expiration Date:   06/19/2025   T4, free    Standing Status:   Future    Expected Date:   06/19/2024    Expiration Date:   06/19/2025   TSH     Standing Status:   Future    Expected Date:   06/19/2024    Expiration Date:   06/19/2025   Lactate dehydrogenase    Standing Status:   Future    Expected Date:   07/10/2024    Expiration Date:   07/10/2025   Magnesium     Standing Status:   Future    Expected Date:   07/10/2024    Expiration Date:   07/10/2025   Total Protein, Urine dipstick    Standing Status:   Future    Expected Date:   07/10/2024    Expiration Date:   07/10/2025   CBC with Differential (Cancer Center Only)    Standing Status:   Future    Expected Date:   07/10/2024    Expiration Date:   07/10/2025   CMP (Cancer Center only)    Standing Status:   Future    Expected Date:   07/10/2024    Expiration Date:   07/10/2025   T4, free    Standing Status:   Future    Expected Date:   07/10/2024    Expiration Date:   07/10/2025   TSH    Standing Status:   Future    Expected Date:   07/10/2024    Expiration Date:   07/10/2025     Pauletta JAYSON Chihuahua, MD  INTERVAL HISTORY: Patient returns for follow-up. No diarrhea, coughing, short of breath, n/v/stomach pain, trouble urinating, hematuria, melena, bloody stool. No bone pain or back pain.  Oncology History  History of breast cancer  06/11/2011 Surgery   Right breast mastectomy with right axillary sentinel lymph node biopsy 1.3 cm invasive lobular cancer, grade 1, atypical lobular hyperplasia, focal LV I, ER 91%, PR 25%, HER-2 negative, 0/8 lymph nodes T1 cN0 stage IA   07/25/2011 -  Anti-estrogen oral therapy   Letrozole  2.5 mg daily 7 years was a plan   Metastatic renal cell carcinoma to bone (HCC)  02/01/2023 Initial Diagnosis   Renal cell carcinoma (HCC)   02/05/2023 Imaging   Bone scan: No evidence of osseous metastatic disease.    04/03/2023 Pathology Results   A. KIDNEY, RIGHT, RADICAL NEPHRECTOMY:  Clear cell renal cell carcinoma with rhabdoid, sarcomatoid Features and  tumor necrosis, nuclear grade 4, size 12.0 cm  Tumor extends into perinephric, renal sinus fat and segmental  branches  of renal vein (pT3a)  Ureteral, vascular and all margins of resection are negative for tumor    Benign adrenal gland    04/23/2023 - 04/23/2023 Chemotherapy   Patient is on Treatment Plan : RENAL CELL Pembrolizumab  (200) + Axitinib q21d     04/25/2023 -  Chemotherapy   Patient is on Treatment Plan : Clear Cell RCC Pembrolizumab  (200) q21d     08/02/2023 Imaging   CT CAP  Improving mediastinal lymph nodes as well as multiple bilateral lung nodules. There are several residual lung nodules identified.   Known bone metastases   No developing new mass lesion or nodal enlargement.   Gallbladder full of stones and is distended, similar to previous.   Pelvic prolapse identified of the anterior posterior compartments.   Metallic foci along the lumen of the cecum are linear and have appearance of clips. Please correlate for ingested material or intervention.   08/02/2023 Imaging   MRI Brain 1. Negative for brain metastasis. 2. Newly enhancing 9 mm lesion in the high left parietal bone, possible early bony metastasis   11/29/2023 Imaging   CT CAP IMPRESSION: 1. Status post right nephrectomy and adrenalectomy. No suspicious soft tissue or contrast enhancement in the nephrectomy bed. 2. Unchanged pulmonary metastases. 3. Unchanged osseous metastases. 4. No evidence of new metastatic disease in the chest, abdomen, or pelvis. 5. Cholelithiasis.  Lungs/Pleura:  Unchanged nodule of the posterior right pulmonary apex measuring 1.8 x 1.3 cm (series 4, image 27).  Unchanged nodule of the paramedian right upper lobe measuring 1.0 x 0.6 cm (series 4, image 29).  Unchanged nodule of the dependent left lower lobe measuring 0.4 cm (series 4, image 70).  Unchanged subpleural nodule of the dependent left lower lobe measuring 1.2 x 0.6 cm (series 4, image 87). No pleural effusion or pneumothorax.   Unchanged mixed lytic and sclerotic metastasis of T3 (series 6, image 105). Callused  fracture deformity of the lateral right eighth rib with interval healing (series 2, image 39). Unchanged small rim sclerotic metastasis of S1 (series 6, image 105) Unchanged faintly lytic metastasis of the left ilium (series 2, image 89).   11/29/2023 Imaging   CT: stable disease MRI brain: no new disease.   NSCLC of left lung (HCC)  03/18/2023 Initial Diagnosis   NSCLC of left lung (HCC)   06/07/2023 Cancer Staging   Staging form: Lung, AJCC 8th Edition - Clinical: cT1, cN1, cM0 - Signed by Tina Pauletta BROCKS, MD on 06/07/2023 Stage prefix: Initial diagnosis Histologic grade (G): GX Histologic grading system: 4 grade system      PHYSICAL EXAMINATION: ECOG PERFORMANCE STATUS: 1  Vitals:   03/27/24 0856 03/27/24 0857  BP: (!) 146/85 (!) 144/89  Pulse: 78   Resp: 18   Temp: 97.7 F (36.5 C)   SpO2: 98%    Filed Weights   03/27/24 0856  Weight: 201 lb 12.8 oz (91.5 kg)    GENERAL: alert, no distress and comfortable LUNGS: clear to auscultation and no wheeze or rales with normal breathing effort HEART: regular rate & rhythm  ABDOMEN: abdomen soft, non-tender and nondistended. Musculoskeletal: no point tenderness  Relevant data reviewed during this visit included labs.  New labs ordered.

## 2024-03-26 NOTE — Assessment & Plan Note (Addendum)
 Bone metastases Patient has no dentaI issues and teeth.  Started Zometa  3 mg every 3 months in Feb 2025 Continue zometa . Recommend patient stop mild vitamin D  [phone] IU and calcium  1200 mg daily Next Zometa  in Dec

## 2024-03-26 NOTE — Assessment & Plan Note (Addendum)
 Continue lenvatinib ; started on 05/07/23 Continue with pembrolizumab  Cannot tolerate lenvatinib  at 20 mg daily so will continue at 10 mg daily   Vit D 1000 international units with Calcium  1200 mg daily 09/03/23-09/13/23 Completed radiation of oliogoprogression of left parietal bone Restaging CT in Nov showed stable disease Continue zometa .  Next zometa  will be in December

## 2024-03-27 ENCOUNTER — Inpatient Hospital Stay

## 2024-03-27 ENCOUNTER — Other Ambulatory Visit: Payer: Self-pay

## 2024-03-27 VITALS — BP 144/89 | HR 78 | Temp 97.7°F | Resp 18 | Wt 201.8 lb

## 2024-03-27 DIAGNOSIS — C7802 Secondary malignant neoplasm of left lung: Secondary | ICD-10-CM | POA: Insufficient documentation

## 2024-03-27 DIAGNOSIS — Z9189 Other specified personal risk factors, not elsewhere classified: Secondary | ICD-10-CM | POA: Diagnosis not present

## 2024-03-27 DIAGNOSIS — C7801 Secondary malignant neoplasm of right lung: Secondary | ICD-10-CM | POA: Diagnosis not present

## 2024-03-27 DIAGNOSIS — C649 Malignant neoplasm of unspecified kidney, except renal pelvis: Secondary | ICD-10-CM

## 2024-03-27 DIAGNOSIS — Z79890 Hormone replacement therapy (postmenopausal): Secondary | ICD-10-CM | POA: Diagnosis not present

## 2024-03-27 DIAGNOSIS — N1831 Chronic kidney disease, stage 3a: Secondary | ICD-10-CM | POA: Diagnosis not present

## 2024-03-27 DIAGNOSIS — Z923 Personal history of irradiation: Secondary | ICD-10-CM | POA: Insufficient documentation

## 2024-03-27 DIAGNOSIS — Z5112 Encounter for antineoplastic immunotherapy: Secondary | ICD-10-CM | POA: Diagnosis not present

## 2024-03-27 DIAGNOSIS — Z853 Personal history of malignant neoplasm of breast: Secondary | ICD-10-CM | POA: Insufficient documentation

## 2024-03-27 DIAGNOSIS — Z905 Acquired absence of kidney: Secondary | ICD-10-CM | POA: Diagnosis not present

## 2024-03-27 DIAGNOSIS — C7951 Secondary malignant neoplasm of bone: Secondary | ICD-10-CM | POA: Diagnosis not present

## 2024-03-27 DIAGNOSIS — C641 Malignant neoplasm of right kidney, except renal pelvis: Secondary | ICD-10-CM | POA: Insufficient documentation

## 2024-03-27 DIAGNOSIS — Z8673 Personal history of transient ischemic attack (TIA), and cerebral infarction without residual deficits: Secondary | ICD-10-CM | POA: Insufficient documentation

## 2024-03-27 DIAGNOSIS — E039 Hypothyroidism, unspecified: Secondary | ICD-10-CM

## 2024-03-27 DIAGNOSIS — Z79620 Long term (current) use of immunosuppressive biologic: Secondary | ICD-10-CM | POA: Diagnosis not present

## 2024-03-27 DIAGNOSIS — D649 Anemia, unspecified: Secondary | ICD-10-CM | POA: Insufficient documentation

## 2024-03-27 DIAGNOSIS — C3412 Malignant neoplasm of upper lobe, left bronchus or lung: Secondary | ICD-10-CM | POA: Insufficient documentation

## 2024-03-27 LAB — CMP (CANCER CENTER ONLY)
ALT: 10 U/L (ref 0–44)
AST: 16 U/L (ref 15–41)
Albumin: 3.8 g/dL (ref 3.5–5.0)
Alkaline Phosphatase: 47 U/L (ref 38–126)
Anion gap: 9 (ref 5–15)
BUN: 29 mg/dL — ABNORMAL HIGH (ref 8–23)
CO2: 25 mmol/L (ref 22–32)
Calcium: 8.7 mg/dL — ABNORMAL LOW (ref 8.9–10.3)
Chloride: 104 mmol/L (ref 98–111)
Creatinine: 1.5 mg/dL — ABNORMAL HIGH (ref 0.44–1.00)
GFR, Estimated: 34 mL/min — ABNORMAL LOW (ref >60)
Glucose, Bld: 99 mg/dL (ref 70–99)
Potassium: 4.2 mmol/L (ref 3.5–5.1)
Sodium: 139 mmol/L (ref 135–145)
Total Bilirubin: 0.2 mg/dL (ref 0.0–1.2)
Total Protein: 7.1 g/dL (ref 6.5–8.1)

## 2024-03-27 LAB — CBC WITH DIFFERENTIAL (CANCER CENTER ONLY)
Abs Immature Granulocytes: 0.03 K/uL (ref 0.00–0.07)
Basophils Absolute: 0 K/uL (ref 0.0–0.1)
Basophils Relative: 0 %
Eosinophils Absolute: 0.2 K/uL (ref 0.0–0.5)
Eosinophils Relative: 3 %
HCT: 32.6 % — ABNORMAL LOW (ref 36.0–46.0)
Hemoglobin: 9.8 g/dL — ABNORMAL LOW (ref 12.0–15.0)
Immature Granulocytes: 1 %
Lymphocytes Relative: 23 %
Lymphs Abs: 1.5 K/uL (ref 0.7–4.0)
MCH: 26.1 pg (ref 26.0–34.0)
MCHC: 30.1 g/dL (ref 30.0–36.0)
MCV: 86.7 fL (ref 80.0–100.0)
Monocytes Absolute: 0.7 K/uL (ref 0.1–1.0)
Monocytes Relative: 11 %
Neutro Abs: 4 K/uL (ref 1.7–7.7)
Neutrophils Relative %: 62 %
Platelet Count: 293 K/uL (ref 150–400)
RBC: 3.76 MIL/uL — ABNORMAL LOW (ref 3.87–5.11)
RDW: 14.2 % (ref 11.5–15.5)
WBC Count: 6.4 K/uL (ref 4.0–10.5)
nRBC: 0 % (ref 0.0–0.2)

## 2024-03-27 LAB — LACTATE DEHYDROGENASE: LDH: 193 U/L (ref 105–235)

## 2024-03-27 LAB — FERRITIN: Ferritin: 432 ng/mL — ABNORMAL HIGH (ref 11–307)

## 2024-03-27 LAB — MAGNESIUM: Magnesium: 2.2 mg/dL (ref 1.7–2.4)

## 2024-03-27 LAB — TOTAL PROTEIN, URINE DIPSTICK: Protein, ur: NEGATIVE mg/dL

## 2024-03-27 LAB — T4, FREE: Free T4: 0.88 ng/dL (ref 0.61–1.12)

## 2024-03-27 MED ORDER — SODIUM CHLORIDE 0.9 % IV SOLN
200.0000 mg | Freq: Once | INTRAVENOUS | Status: AC
  Administered 2024-03-27: 200 mg via INTRAVENOUS
  Filled 2024-03-27: qty 200

## 2024-03-27 MED ORDER — SODIUM CHLORIDE 0.9 % IV SOLN: INTRAVENOUS | Status: DC

## 2024-03-27 NOTE — Assessment & Plan Note (Addendum)
 Continue levothyroxine  75 mcg once daily.  Will change dose if needed Continue to monitor fT4

## 2024-03-27 NOTE — Patient Instructions (Signed)
 [facility] - A DEPT OF [facility]  Discharge Instructions: Thank you for choosing [facility] to provide your oncology and hematology care.   If you have a lab appointment with the Cancer Center, please go directly to the Cancer Center and check in at the registration area.   Wear comfortable clothing and clothing appropriate for easy access to any Portacath or PICC line.   We strive to give you quality time with your provider. You may need to reschedule your appointment if you arrive late (15 or more minutes).  Arriving late affects you and other patients whose appointments are after yours.  Also, if you miss three or more appointments without notifying the office, you may be dismissed from the clinic at the provider's discretion.      For prescription refill requests, have your pharmacy contact our office and allow 72 hours for refills to be completed.    Today you received the following chemotherapy and/or immunotherapy agents: Pembrolizumab  Rande Lawman)     To help prevent nausea and vomiting after your treatment, we encourage you to take your nausea medication as directed.  BELOW ARE SYMPTOMS THAT SHOULD BE REPORTED IMMEDIATELY: *FEVER GREATER THAN 100.4 F (38 C) OR HIGHER *CHILLS OR SWEATING *NAUSEA AND VOMITING THAT IS NOT CONTROLLED WITH YOUR NAUSEA MEDICATION *UNUSUAL SHORTNESS OF BREATH *UNUSUAL BRUISING OR BLEEDING *URINARY PROBLEMS (pain or burning when urinating, or frequent urination) *BOWEL PROBLEMS (unusual diarrhea, constipation, pain near the anus) TENDERNESS IN MOUTH AND THROAT WITH OR WITHOUT PRESENCE OF ULCERS (sore throat, sores in mouth, or a toothache) UNUSUAL RASH, SWELLING OR PAIN  UNUSUAL VAGINAL DISCHARGE OR ITCHING   Items with * indicate a potential emergency and should be followed up as soon as possible or go to the Emergency Department if any problems should occur.  Please show the CHEMOTHERAPY ALERT CARD or  IMMUNOTHERAPY ALERT CARD at check-in to the Emergency Department and triage nurse.  Should you have questions after your visit or need to cancel or reschedule your appointment, please contact [facility] - A DEPT OF [facility]  Dept: [phone]  and follow the prompts.  Office hours are 8:00 a.m. to 4:30 p.m. Monday - Friday. Please note that voicemails left after 4:00 p.m. may not be returned until the following business day.  We are closed weekends and major holidays. You have access to a nurse at all times for urgent questions. Please call the main number to the clinic Dept: [phone] and follow the prompts.   For any non-urgent questions, you may also contact your provider using MyChart. We now offer e-Visits for anyone 10 and older to request care online for non-urgent symptoms. For details visit [email or web address].   Also download the MyChart app! Go to the app store, search "MyChart", open the app, select [place], and log in with your MyChart username and password.  Zoledronic Acid Injection (Cancer) What is this medication? ZOLEDRONIC ACID (ZOE le dron ik AS id) treats high calcium levels in the blood caused by cancer. It may also be used with chemotherapy to treat weakened bones caused by cancer. It works by slowing down the release of calcium from bones. This lowers calcium levels in your blood. It also makes your bones stronger and less likely to break (fracture). It belongs to a group of medications called bisphosphonates. This medicine may be used for other purposes; ask your health care  provider or pharmacist if you have questions. COMMON BRAND NAME(S): Zometa, Zometa Powder What should I tell my care team before I take this medication? They need to know if you have any of these conditions: Dehydration Dental disease Kidney disease Liver disease Low levels of calcium in the blood Lung or breathing disease, such as asthma Receiving  steroids, such as dexamethasone or prednisone An unusual or allergic reaction to zoledronic acid, other medications, foods, dyes, or preservatives Pregnant or trying to get pregnant Breast-feeding How should I use this medication? This medication is injected into a vein. It is given by your care team in a hospital or clinic setting. Talk to your care team about the use of this medication in children. Special care may be needed. Overdosage: If you think you have taken too much of this medicine contact a poison control center or emergency room at once. NOTE: This medicine is only for you. Do not share this medicine with others. What if I miss a dose? Keep appointments for follow-up doses. It is important not to miss your dose. Call your care team if you are unable to keep an appointment. What may interact with this medication? Certain antibiotics given by injection Diuretics, such as bumetanide, furosemide NSAIDs, medications for pain and inflammation, such as ibuprofen or naproxen Teriparatide Thalidomide This list may not describe all possible interactions. Give your health care provider a list of all the medicines, herbs, non-prescription drugs, or dietary supplements you use. Also tell them if you smoke, drink alcohol, or use illegal drugs. Some items may interact with your medicine. What should I watch for while using this medication? Visit your care team for regular checks on your progress. It may be some time before you see the benefit from this medication. Some people who take this medication have severe bone, joint, or muscle pain. This medication may also increase your risk for jaw problems or a broken thigh bone. Tell your care team right away if you have severe pain in your jaw, bones, joints, or muscles. Tell you care team if you have any pain that does not go away or that gets worse. Tell your dentist and dental surgeon that you are taking this medication. You should not have major  dental surgery while on this medication. See your dentist to have a dental exam and fix any dental problems before starting this medication. Take good care of your teeth while on this medication. Make sure you see your dentist for regular follow-up appointments. You should make sure you get enough calcium and vitamin D while you are taking this medication. Discuss the foods you eat and the vitamins you take with your care team. Check with your care team if you have severe diarrhea, nausea, and vomiting, or if you sweat a lot. The loss of too much body fluid may make it dangerous for you to take this medication. You may need bloodwork while taking this medication. Talk to your care team if you wish to become pregnant or think you might be pregnant. This medication can cause serious birth defects. What side effects may I notice from receiving this medication? Side effects that you should report to your care team as soon as possible: Allergic reactions--skin rash, itching, hives, swelling of the face, lips, tongue, or throat Kidney injury--decrease in the amount of urine, swelling of the ankles, hands, or feet Low calcium level--muscle pain or cramps, confusion, tingling, or numbness in the hands or feet Osteonecrosis of the jaw--pain, swelling, or  redness in the mouth, numbness of the jaw, poor healing after dental work, unusual discharge from the mouth, visible bones in the mouth Severe bone, joint, or muscle pain Side effects that usually do not require medical attention (report to your care team if they continue or are bothersome): Constipation Fatigue Fever Loss of appetite Nausea Stomach pain This list may not describe all possible side effects. Call your doctor for medical advice about side effects. You may report side effects to FDA at 1-800-FDA-1088. Where should I keep my medication? This medication is given in a hospital or clinic. It will not be stored at home. NOTE: This sheet is a  summary. It may not cover all possible information. If you have questions about this medicine, talk to your doctor, pharmacist, or health care provider.  2024 Elsevier/Gold Standard (2021-06-16 00:00:00)

## 2024-03-27 NOTE — Assessment & Plan Note (Addendum)
 Borderline microcytosis.  Will obtain ferritin, B12 if able

## 2024-03-29 ENCOUNTER — Other Ambulatory Visit: Payer: Self-pay

## 2024-03-30 ENCOUNTER — Other Ambulatory Visit: Payer: Self-pay

## 2024-04-01 ENCOUNTER — Other Ambulatory Visit (HOSPITAL_COMMUNITY): Payer: Self-pay

## 2024-04-01 ENCOUNTER — Other Ambulatory Visit: Payer: Self-pay

## 2024-04-03 ENCOUNTER — Encounter (HOSPITAL_COMMUNITY)
Admission: RE | Admit: 2024-04-03 | Discharge: 2024-04-03 | Disposition: A | Discharge status: Home / Self Care | Source: Ambulatory Visit

## 2024-04-03 ENCOUNTER — Other Ambulatory Visit: Payer: Self-pay

## 2024-04-03 DIAGNOSIS — M19071 Primary osteoarthritis, right ankle and foot: Secondary | ICD-10-CM | POA: Diagnosis not present

## 2024-04-03 DIAGNOSIS — E039 Hypothyroidism, unspecified: Secondary | ICD-10-CM

## 2024-04-03 DIAGNOSIS — C649 Malignant neoplasm of unspecified kidney, except renal pelvis: Secondary | ICD-10-CM | POA: Diagnosis not present

## 2024-04-03 DIAGNOSIS — C7951 Secondary malignant neoplasm of bone: Secondary | ICD-10-CM | POA: Diagnosis not present

## 2024-04-03 DIAGNOSIS — S2231XD Fracture of one rib, right side, subsequent encounter for fracture with routine healing: Secondary | ICD-10-CM | POA: Diagnosis not present

## 2024-04-03 MED ORDER — TECHNETIUM TC 99M MEDRONATE IV KIT
21.5000 | PACK | Freq: Once | INTRAVENOUS | Status: AC | PRN
  Administered 2024-04-03: 21.5 via INTRAVENOUS

## 2024-04-06 ENCOUNTER — Other Ambulatory Visit: Payer: Self-pay

## 2024-04-08 ENCOUNTER — Other Ambulatory Visit (HOSPITAL_COMMUNITY): Payer: Self-pay

## 2024-04-08 ENCOUNTER — Telehealth: Payer: Self-pay | Admitting: Radiation Oncology

## 2024-04-08 ENCOUNTER — Other Ambulatory Visit: Payer: Self-pay

## 2024-04-08 NOTE — Telephone Encounter (Addendum)
 12/3 @ 12:09 pm Spoke to patient's daughter to sch patient for follow up consult.  Patient's daughter requested Fridays due to their sch. Called patient's daughter back/left voicemail to confirm date/time.  Waiting on call back.

## 2024-04-08 NOTE — Progress Notes (Signed)
 Specialty Pharmacy Refill Coordination Note  Diane Paul is a 83 y.o. female contacted today regarding refills of specialty medication(s) Lenvatinib  Mesylate (LENVIMA )   Patient requested Delivery   Delivery date: 04/10/24   Verified address: [address]  [place]   Medication will be filled on: 04/09/24

## 2024-04-09 ENCOUNTER — Ambulatory Visit

## 2024-04-09 ENCOUNTER — Other Ambulatory Visit: Payer: Self-pay

## 2024-04-13 NOTE — Progress Notes (Signed)
 Histology and Location of Primary Cancer: Right clear cell Renal Cell carcinoma  Sites of Visceral and Bony Metastatic Disease:  NSCLC of left lung (HCC)   Location(s) of Symptomatic Metastases: left lung  Past/Anticipated chemotherapy by medical oncology, if any: ***  Pain on a scale of 0-10 is: {Number; 1-10  not applicable:20727}    If Spine Met(s), symptoms, if any, include: Bowel/Bladder retention or incontinence (please describe): *** Numbness or weakness in extremities (please describe): *** Current Decadron  regimen, if applicable: ***  Ambulatory status? Walker? Wheelchair?: {VQI Ambulatory Status:20974}  SAFETY ISSUES: Prior radiation? yes Pacemaker/ICD? no Possible current pregnancy? No, hysterectomy Is the patient on methotrexate? no  Current Complaints / other details:  Port-a-cath

## 2024-04-14 ENCOUNTER — Ambulatory Visit: Payer: Self-pay

## 2024-04-15 ENCOUNTER — Other Ambulatory Visit: Payer: Self-pay

## 2024-04-15 DIAGNOSIS — E039 Hypothyroidism, unspecified: Secondary | ICD-10-CM

## 2024-04-15 NOTE — Progress Notes (Unsigned)
 [facility] OFFICE PROGRESS NOTE  Patient Care Team: Duanne Butler DASEN, MD as PCP - General (Family Medicine)  83 y.o.w. 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   Diagnosis: stage IV clear cell RCC Treatment: Right nephrectomy. 04/26/23 started pembrolizumab . Delayed started of Lenvatinib  at 10 mg daily due to wound on 05/07/23.   Stable disease. Will continue len/pembro.  Discussed result from bone scan and CT. She only has a few oliogometastasis and has dramatic improvement of PS since diagnosis last year. Recommend evaluation for SBRT to oliogometastases. Will see Radiation oncology for evaluation.  Assessment & Plan Metastatic renal cell carcinoma to bone (HCC) Cannot tolerate lenvatinib  at 20 mg daily so will continue at 10 mg daily. started on 05/07/23 Continue with pembrolizumab  Vit D 1000 international units with Calcium  1200 mg daily 09/03/23-09/13/23 Completed radiation of oliogoprogression of left parietal bone Restaging CT in Nov showed stable disease Continue zometa .  Next zometa  will be in December Evaluation for radiation for oligometastases Primary hypertension <140 SBP at home. Will hold amlodipine  for now.  Continue to monitor blood pressure at home. Stage 3a chronic kidney disease (HCC) Continue to increase fluid intake Monitor with renal function every cycle Acquired hypothyroidism Continue levothyroxine  75 mcg once daily.  Will change dose if needed Continue to monitor fT4 At high risk for fracture Bone metastases Patient has no dentaI issues and teeth.  Started Zometa  3 mg every 3 months in Feb 2025 Continue zometa . Recommend patient stop mild vitamin D  [phone] IU and calcium  1200 mg daily No jaw pain. Next Zometa  today  No orders of the defined types were placed in this encounter.    Pauletta JAYSON Chihuahua, MD  INTERVAL  HISTORY: Patient returns for follow-up. No chest pain, short of breath. Has allergy. No diarrhea, nausea, vomiting or stomach. Last night grease cause nausea. No difficulty urinating. No rash or new joint pain. Chronic left knee pain.  Oncology History  History of breast cancer  06/11/2011 Surgery   Right breast mastectomy with right axillary sentinel lymph node biopsy 1.3 cm invasive lobular cancer, grade 1, atypical lobular hyperplasia, focal LV I, ER 91%, PR 25%, HER-2 negative, 0/8 lymph nodes T1 cN0 stage IA   07/25/2011 -  Anti-estrogen oral therapy   Letrozole  2.5 mg daily 7 years was a plan   Metastatic renal cell carcinoma to bone (HCC)  02/01/2023 Initial Diagnosis   Renal cell carcinoma (HCC)   02/05/2023 Imaging   Bone scan: No evidence of osseous metastatic disease.    04/03/2023 Pathology Results   A. 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 There are several residual lung nodules identified.   Known bone metastases  No developing new mass lesion or nodal enlargement.   Gallbladder full of stones and is distended, similar to previous.   Pelvic prolapse identified of the anterior posterior compartments.   Metallic foci along the lumen of the cecum are linear and have appearance of clips. Please correlate for ingested material  or intervention.   08/02/2023 Imaging   MRI Brain 1. Negative for brain metastasis. 2. Newly enhancing 9 mm lesion in the high left parietal bone, possible early bony metastasis   11/29/2023 Imaging   CT CAP IMPRESSION: 1. Status post right nephrectomy and adrenalectomy. No suspicious soft tissue or contrast enhancement in the nephrectomy bed. 2. Unchanged pulmonary metastases. 3. Unchanged osseous metastases. 4. No evidence of new metastatic disease in the chest, abdomen, or pelvis. 5. Cholelithiasis.  Lungs/Pleura:  Unchanged nodule of the posterior right pulmonary apex measuring 1.8 x 1.3 cm (series 4, image 27).  Unchanged nodule of the paramedian right upper lobe measuring 1.0 x 0.6 cm (series 4, image 29).  Unchanged nodule of the dependent left lower lobe measuring 0.4 cm (series 4, image 70).  Unchanged subpleural nodule of the dependent left lower lobe measuring 1.2 x 0.6 cm (series 4, image 87). No pleural effusion or pneumothorax.   Unchanged mixed lytic and sclerotic metastasis of T3 (series 6, image 105). Callused fracture deformity of the lateral right eighth rib with interval healing (series 2, image 39). Unchanged small rim sclerotic metastasis of S1 (series 6, image 105) Unchanged faintly lytic metastasis of the left ilium (series 2, image 89).   11/29/2023 Imaging   CT: stable disease MRI brain: no new disease.   NSCLC of left lung (HCC)  03/18/2023 Initial Diagnosis   NSCLC of left lung (HCC)   06/07/2023 Cancer Staging   Staging form: Lung, AJCC 8th Edition - Clinical: cT1, cN1, cM0 - Signed by Tina Pauletta BROCKS, MD on 06/07/2023 Stage prefix: Initial diagnosis Histologic grade (G): GX Histologic grading system: 4 grade system      PHYSICAL EXAMINATION: ECOG PERFORMANCE STATUS: 1  Vitals:   04/17/24 0817  BP: (!) 146/91  Pulse: 77  Resp: 16  Temp: 97.8 F (36.6 C)  SpO2: 100%   Filed Weights   04/17/24 0817  Weight: 201 lb (91.2 kg)     GENERAL: alert, no distress and comfortable EYES:  sclera clear OROPHARYNX: no exudate  NECK: No palpable mass LYMPH:  no palpable cervical lymphadenopathy  LUNGS: clear to auscultation and no wheeze or rales with normal breathing effort HEART: regular rate & rhythm  ABDOMEN: abdomen soft, non-tender and nondistended. Musculoskeletal: no edema NEURO: no focal motor/sensory deficits  Relevant data reviewed during this visit included labs.

## 2024-04-17 ENCOUNTER — Ambulatory Visit: Admission: RE | Admit: 2024-04-17 | Discharge: 2024-04-17 | Attending: Radiation Oncology

## 2024-04-17 ENCOUNTER — Inpatient Hospital Stay

## 2024-04-17 ENCOUNTER — Ambulatory Visit
Admission: RE | Admit: 2024-04-17 | Discharge: 2024-04-17 | Disposition: A | Discharge status: Home / Self Care | Source: Ambulatory Visit | Attending: Urology | Admitting: Urology

## 2024-04-17 ENCOUNTER — Ambulatory Visit: Admission: RE | Admit: 2024-04-17 | Discharge: 2024-04-17 | Attending: Urology | Admitting: Urology

## 2024-04-17 ENCOUNTER — Encounter: Payer: Self-pay | Admitting: Radiation Oncology

## 2024-04-17 VITALS — BP 146/85 | HR 91 | Temp 97.5°F | Resp 20 | Ht 67.0 in | Wt 203.0 lb

## 2024-04-17 VITALS — BP 138/96 | HR 78 | Resp 16

## 2024-04-17 VITALS — BP 146/91 | HR 77 | Temp 97.8°F | Resp 16 | Ht 67.0 in | Wt 201.0 lb

## 2024-04-17 DIAGNOSIS — Z9189 Other specified personal risk factors, not elsewhere classified: Secondary | ICD-10-CM | POA: Diagnosis not present

## 2024-04-17 DIAGNOSIS — C649 Malignant neoplasm of unspecified kidney, except renal pelvis: Secondary | ICD-10-CM | POA: Insufficient documentation

## 2024-04-17 DIAGNOSIS — Z905 Acquired absence of kidney: Secondary | ICD-10-CM | POA: Insufficient documentation

## 2024-04-17 DIAGNOSIS — C7801 Secondary malignant neoplasm of right lung: Secondary | ICD-10-CM | POA: Insufficient documentation

## 2024-04-17 DIAGNOSIS — Z79899 Other long term (current) drug therapy: Secondary | ICD-10-CM | POA: Insufficient documentation

## 2024-04-17 DIAGNOSIS — I10 Essential (primary) hypertension: Secondary | ICD-10-CM

## 2024-04-17 DIAGNOSIS — Z923 Personal history of irradiation: Secondary | ICD-10-CM | POA: Insufficient documentation

## 2024-04-17 DIAGNOSIS — C3412 Malignant neoplasm of upper lobe, left bronchus or lung: Secondary | ICD-10-CM | POA: Insufficient documentation

## 2024-04-17 DIAGNOSIS — Z79620 Long term (current) use of immunosuppressive biologic: Secondary | ICD-10-CM | POA: Insufficient documentation

## 2024-04-17 DIAGNOSIS — M129 Arthropathy, unspecified: Secondary | ICD-10-CM | POA: Insufficient documentation

## 2024-04-17 DIAGNOSIS — C641 Malignant neoplasm of right kidney, except renal pelvis: Secondary | ICD-10-CM | POA: Insufficient documentation

## 2024-04-17 DIAGNOSIS — Z8673 Personal history of transient ischemic attack (TIA), and cerebral infarction without residual deficits: Secondary | ICD-10-CM | POA: Insufficient documentation

## 2024-04-17 DIAGNOSIS — E039 Hypothyroidism, unspecified: Secondary | ICD-10-CM | POA: Diagnosis not present

## 2024-04-17 DIAGNOSIS — N1831 Chronic kidney disease, stage 3a: Secondary | ICD-10-CM | POA: Diagnosis not present

## 2024-04-17 DIAGNOSIS — C7951 Secondary malignant neoplasm of bone: Secondary | ICD-10-CM | POA: Insufficient documentation

## 2024-04-17 DIAGNOSIS — M8580 Other specified disorders of bone density and structure, unspecified site: Secondary | ICD-10-CM

## 2024-04-17 DIAGNOSIS — C7802 Secondary malignant neoplasm of left lung: Secondary | ICD-10-CM

## 2024-04-17 DIAGNOSIS — Z853 Personal history of malignant neoplasm of breast: Secondary | ICD-10-CM | POA: Insufficient documentation

## 2024-04-17 DIAGNOSIS — K219 Gastro-esophageal reflux disease without esophagitis: Secondary | ICD-10-CM | POA: Insufficient documentation

## 2024-04-17 DIAGNOSIS — Z5112 Encounter for antineoplastic immunotherapy: Secondary | ICD-10-CM | POA: Diagnosis present

## 2024-04-17 DIAGNOSIS — C3492 Malignant neoplasm of unspecified part of left bronchus or lung: Secondary | ICD-10-CM

## 2024-04-17 DIAGNOSIS — Z79890 Hormone replacement therapy (postmenopausal): Secondary | ICD-10-CM | POA: Insufficient documentation

## 2024-04-17 DIAGNOSIS — D649 Anemia, unspecified: Secondary | ICD-10-CM | POA: Insufficient documentation

## 2024-04-17 LAB — CMP (CANCER CENTER ONLY)
ALT: 8 U/L (ref 0–44)
AST: 18 U/L (ref 15–41)
Albumin: 3.8 g/dL (ref 3.5–5.0)
Alkaline Phosphatase: 51 U/L (ref 38–126)
Anion gap: 10 (ref 5–15)
BUN: 22 mg/dL (ref 8–23)
CO2: 26 mmol/L (ref 22–32)
Calcium: 9.1 mg/dL (ref 8.9–10.3)
Chloride: 103 mmol/L (ref 98–111)
Creatinine: 1.34 mg/dL — ABNORMAL HIGH (ref 0.44–1.00)
GFR, Estimated: 39 mL/min — ABNORMAL LOW (ref >60)
Glucose, Bld: 110 mg/dL — ABNORMAL HIGH (ref 70–99)
Potassium: 4.1 mmol/L (ref 3.5–5.1)
Sodium: 139 mmol/L (ref 135–145)
Total Bilirubin: 0.3 mg/dL (ref 0.0–1.2)
Total Protein: 7.4 g/dL (ref 6.5–8.1)

## 2024-04-17 LAB — CBC WITH DIFFERENTIAL (CANCER CENTER ONLY)
Abs Immature Granulocytes: 0.01 K/uL (ref 0.00–0.07)
Basophils Absolute: 0 K/uL (ref 0.0–0.1)
Basophils Relative: 1 %
Eosinophils Absolute: 0.2 K/uL (ref 0.0–0.5)
Eosinophils Relative: 3 %
HCT: 34 % — ABNORMAL LOW (ref 36.0–46.0)
Hemoglobin: 10.4 g/dL — ABNORMAL LOW (ref 12.0–15.0)
Immature Granulocytes: 0 %
Lymphocytes Relative: 26 %
Lymphs Abs: 1.5 K/uL (ref 0.7–4.0)
MCH: 25.8 pg — ABNORMAL LOW (ref 26.0–34.0)
MCHC: 30.6 g/dL (ref 30.0–36.0)
MCV: 84.4 fL (ref 80.0–100.0)
Monocytes Absolute: 0.7 K/uL (ref 0.1–1.0)
Monocytes Relative: 11 %
Neutro Abs: 3.4 K/uL (ref 1.7–7.7)
Neutrophils Relative %: 59 %
Platelet Count: 286 K/uL (ref 150–400)
RBC: 4.03 MIL/uL (ref 3.87–5.11)
RDW: 14.6 % (ref 11.5–15.5)
WBC Count: 5.8 K/uL (ref 4.0–10.5)
nRBC: 0 % (ref 0.0–0.2)

## 2024-04-17 LAB — TOTAL PROTEIN, URINE DIPSTICK: Protein, ur: NEGATIVE mg/dL

## 2024-04-17 LAB — MAGNESIUM: Magnesium: 2.1 mg/dL (ref 1.7–2.4)

## 2024-04-17 LAB — T4, FREE: Free T4: 0.89 ng/dL (ref 0.61–1.12)

## 2024-04-17 LAB — TSH: TSH: 21.2 u[IU]/mL — ABNORMAL HIGH (ref 0.350–4.500)

## 2024-04-17 LAB — LACTATE DEHYDROGENASE: LDH: 257 U/L — ABNORMAL HIGH (ref 105–235)

## 2024-04-17 MED ORDER — SODIUM CHLORIDE 0.9 % IV SOLN: INTRAVENOUS | Status: DC

## 2024-04-17 MED ORDER — SODIUM CHLORIDE 0.9 % IV SOLN
200.0000 mg | Freq: Once | INTRAVENOUS | Status: AC
  Administered 2024-04-17: 200 mg via INTRAVENOUS
  Filled 2024-04-17: qty 200

## 2024-04-17 MED ORDER — ZOLEDRONIC ACID 4 MG/5ML IV CONC
3.0000 mg | Freq: Once | INTRAVENOUS | Status: AC
  Administered 2024-04-17: 3 mg via INTRAVENOUS
  Filled 2024-04-17: qty 3.75

## 2024-04-17 MED ORDER — SODIUM CHLORIDE 0.9% FLUSH: 10.0000 mL | INTRAVENOUS | Status: DC | PRN

## 2024-04-17 NOTE — Assessment & Plan Note (Addendum)
 Bone metastases Patient has no dentaI issues and teeth.  Started Zometa  3 mg every 3 months in Feb 2025 Continue zometa . Recommend patient stop mild vitamin D  [phone] IU and calcium  1200 mg daily No jaw pain. Next Zometa  today

## 2024-04-17 NOTE — Patient Instructions (Signed)
 [facility] - A DEPT OF [facility] [place]  Discharge Instructions: Thank you for choosing [facility] to provide your oncology and hematology care.   If you have a lab appointment with the Cancer Center, please go directly to the Cancer Center and check in at the registration area.   Wear comfortable clothing and clothing appropriate for easy access to any Portacath or PICC line.   We strive to give you quality time with your provider. You may need to reschedule your appointment if you arrive late (15 or more minutes).  Arriving late affects you and other patients whose appointments are after yours.  Also, if you miss three or more appointments without notifying the office, you may be dismissed from the clinic at the providers discretion.      For prescription refill requests, have your pharmacy contact our office and allow 72 hours for refills to be completed.    Today you received the following chemotherapy and/or immunotherapy agents keytruda  and zometa       To help prevent nausea and vomiting after your treatment, we encourage you to take your nausea medication as directed.  BELOW ARE SYMPTOMS THAT SHOULD BE REPORTED IMMEDIATELY: *FEVER GREATER THAN 100.4 F (38 C) OR HIGHER *CHILLS OR SWEATING *NAUSEA AND VOMITING THAT IS NOT CONTROLLED WITH YOUR NAUSEA MEDICATION *UNUSUAL SHORTNESS OF BREATH *UNUSUAL BRUISING OR BLEEDING *URINARY PROBLEMS (pain or burning when urinating, or frequent urination) *BOWEL PROBLEMS (unusual diarrhea, constipation, pain near the anus) TENDERNESS IN MOUTH AND THROAT WITH OR WITHOUT PRESENCE OF ULCERS (sore throat, sores in mouth, or a toothache) UNUSUAL RASH, SWELLING OR PAIN  UNUSUAL VAGINAL DISCHARGE OR ITCHING   Items with * indicate a potential emergency and should be followed up as soon as possible or go to the Emergency Department if any problems should occur.  Please show the CHEMOTHERAPY ALERT CARD or  IMMUNOTHERAPY ALERT CARD at check-in to the Emergency Department and triage nurse.  Should you have questions after your visit or need to cancel or reschedule your appointment, please contact [facility] - A DEPT OF [facility]  Dept: [phone]  and follow the prompts.  Office hours are 8:00 a.m. to 4:30 p.m. Monday - Friday. Please note that voicemails left after 4:00 p.m. may not be returned until the following business day.  We are closed weekends and major holidays. You have access to a nurse at all times for urgent questions. Please call the main number to the clinic Dept: [phone] and follow the prompts.   For any non-urgent questions, you may also contact your provider using MyChart. We now offer e-Visits for anyone 63 and older to request care online for non-urgent symptoms. For details visit [email or web address].   Also download the MyChart app! Go to the app store, search MyChart, open the app, select [place], and log in with your MyChart username and password.

## 2024-04-17 NOTE — Assessment & Plan Note (Addendum)
 Cannot tolerate lenvatinib  at 20 mg daily so will continue at 10 mg daily. started on 05/07/23 Continue with pembrolizumab  Vit D 1000 international units with Calcium  1200 mg daily 09/03/23-09/13/23 Completed radiation of oliogoprogression of left parietal bone Restaging CT in Nov showed stable disease Continue zometa .  Next zometa  will be in December Evaluation for radiation for oligometastases

## 2024-04-17 NOTE — Assessment & Plan Note (Addendum)
 Continue levothyroxine  75 mcg once daily.  Will change dose if needed Continue to monitor fT4

## 2024-04-17 NOTE — Assessment & Plan Note (Addendum)
<  140 SBP at home. Will hold amlodipine  for now.  Continue to monitor blood pressure at home.

## 2024-04-17 NOTE — Assessment & Plan Note (Addendum)
 Continue to increase fluid intake Monitor with renal function every cycle

## 2024-04-18 NOTE — Progress Notes (Signed)
°  Radiation Oncology         [phone]) [phone] ________________________________  Name: Diane Paul MRN: [id number]  Date: 04/17/2024  DOB: 11-30-1940  STEREOTACTIC BODY RADIOTHERAPY SIMULATION AND TREATMENT PLANNING NOTE    ICD-10-CM   1. Malignant neoplasm metastatic to both lungs (HCC)  C78.01    C78.02       DIAGNOSIS:  83 y/o woman with oligoprogression of Stage IV clear cell renal cell carcinoma involving both lungs  NARRATIVE:  The patient was brought to the CT Simulation planning suite.  Identity was confirmed.  All relevant records and images related to the planned course of therapy were reviewed.  The patient freely provided informed written consent to proceed with treatment after reviewing the details related to the planned course of therapy. The consent form was witnessed and verified by the simulation staff.  Then, the patient was set-up in a stable reproducible  supine position for radiation therapy.  A BodyFix immobilization pillow was fabricated for reproducible positioning.  Then I personally applied the abdominal compression paddle to limit respiratory excursion.  4D respiratoy motion management CT images were obtained.  Surface markings were placed.  The CT images were loaded into the planning software.  Then, using Cine, MIP, and standard views, the internal target volume (ITV) and planning target volumes (PTV) were delinieated, and avoidance structures were contoured.  Treatment planning then occurred.  The radiation prescription was entered and confirmed.  A total of two complex treatment devices were fabricated in the form of the BodyFix immobilization pillow and a neck accuform cushion.  I have requested : 3D Simulation  I have requested a DVH of the following structures: Heart, Lungs, Esophagus, Chest Wall, Brachial Plexus, Major Blood Vessels, and targets.  SPECIAL TREATMENT PROCEDURE:  The planned course of therapy using radiation constitutes a special treatment procedure.  Special care is required in the management of this patient for the following reasons. This treatment constitutes a Special Treatment Procedure for the following reason: [ High dose per fraction requiring special monitoring for increased toxicities of treatment including daily imaging..  The special nature of the planned course of radiotherapy will require increased physician supervision and oversight to ensure patient's safety with optimal treatment outcomes.  This requires extended time and effort.    RESPIRATORY MOTION MANAGEMENT SIMULATION:  In order to account for effect of respiratory motion on target structures and other organs in the planning and delivery of radiotherapy, this patient underwent respiratory motion management simulation.  To accomplish this, when the patient was brought to the CT simulation planning suite, 4D respiratoy motion management CT images were obtained.  The CT images were loaded into the planning software.  Then, using a variety of tools including Cine, MIP, and standard views, the target volume and planning target volumes (PTV) were delineated.  Avoidance structures were contoured.  Treatment planning then occurred.  Dose volume histograms were generated and reviewed for each of the requested structure.  The resulting plan was carefully reviewed and approved today.  PLAN:  The patient will receive 60 Gy in 5 fractions to two lesions in the Right Upper Lobe and one lesion in the Left Lower Lobe.  ________________________________  Diane Paul, M.D.

## 2024-04-19 NOTE — Progress Notes (Signed)
 Radiation Oncology         [phone] ________________________________  Re-Consultation  Name: Diane Paul MRN: [id number]  Date of Service: 04/17/2024 DOB: 10-27-1940  RR:Eprxjmi, Butler DASEN, MD  Duanne Butler DASEN, MD   REFERRING PHYSICIAN: Duanne Butler DASEN, MD  DIAGNOSIS: The primary encounter diagnosis was Metastatic renal cell carcinoma to bone [facility]). A diagnosis of NSCLC of left lung (HCC) was also pertinent to this visit.    ICD-10-CM   1. Metastatic renal cell carcinoma to bone (HCC)  C79.51    C64.9     2. NSCLC of left lung [facility])  C34.92       HISTORY OF PRESENT ILLNESS: Diane Paul is a 83 y.o. female seen at the request of Dr. Tina. She has a remote history of lobular right breast cancer, s/p right mastectomy on 06/11/11 and 7 years of antiestrogen therapy with letrozole . She also has a more recent history of metastatic renal cell carcinoma. In summary, she was found to have a right renal mass and pulmonary nodules on work up for anemia in 01/2023. Further work up with PET scan and two bronchoscopies confirmed malignancy within the left upper lobe. She proceeded to right nephrectomy on 04/03/23 under Dr. Alvaro, with pathology showing stage [id number] clear cell renal cell carcinoma with rhabdoid and sarcomatoid features and extension into the renal sinus fat and segmental branches of the renal vein. Disease restaging from 04/18/23 showed disease progression in the lungs and a persistent 2.9 cm lytic lesion in the left ilium. Brain MRI obtained a week later was negative for metastatic disease. She was started on systemic therapy under Dr. Tina, with pembrolizumab  starting 04/26/23 and levatinib on 05/07/23. She underwent restaging CT C/A/P and brain MRI on 08/02/23 showing stable lytic bone lesions and a new 9 mm bony lesion in the high left parietal bone. We met the patient on 08/20/23, and she subsequently received 5 fractions of SBRT to the new left parietal lesion and  completed treatment on 09/13/23.  Since we last saw the patient, she has continued on pembrolizumab  and levatinib under Dr. Tina. She underwent a restaging CT C/A/P on 03/13/24 showing overall stable disease, with unchanged bilateral pulmonary nodules and osseous metastatic disease and no new metastatic disease or local recurrence. This was followed by a bone scan on 04/03/24 showing no residual or recurrent suspicious osseous uptake.  PREVIOUS RADIATION THERAPY: Yes  09/03/23 - 09/13/23: Skull, left parietal / 25 Gy in 5 fractions (SBRT)  PAST MEDICAL HISTORY:  Past Medical History:  Diagnosis Date   Anemia    Anxiety    nervous sometimes, denies panica ttacks    Arthritis    knees ?   Blood transfusion    post vaginal birth- 1960, 01/2023- anemia   Breast cancer (HCC)    Right Breast Cancer   Cancer (HCC)    R breast cancer   Dry mouth    GERD (gastroesophageal reflux disease)    Hypertension    Neuromuscular disorder (HCC)    L leg, thigh- burns sometimes   Shortness of breath    sometimes    Use of letrozole  (Femara ) 05/07/2010   neoadjuvant femara  therapy since 1/212      PAST SURGICAL HISTORY: Past Surgical History:  Procedure Laterality Date   ABDOMINAL HYSTERECTOMY     BIOPSY  02/03/2023   Procedure: BIOPSY;  Surgeon: Rollin Dover, MD;  Location: [facility] ENDOSCOPY;  Service: Gastroenterology;;  gastric   BREAST LUMPECTOMY  BREAST SURGERY     Right   BRONCHIAL BIOPSY  02/12/2023   Procedure: BRONCHIAL BIOPSIES;  Surgeon: Shelah Lamar RAMAN, MD;  Location: [facility] ENDOSCOPY;  Service: Pulmonary;;   BRONCHIAL BIOPSY  03/11/2023   Procedure: BRONCHIAL BIOPSIES;  Surgeon: Shelah Lamar RAMAN, MD;  Location: [facility] ENDOSCOPY;  Service: Pulmonary;;   BRONCHIAL BRUSHINGS  02/12/2023   Procedure: BRONCHIAL BRUSHINGS;  Surgeon: Shelah Lamar RAMAN, MD;  Location: [facility] ENDOSCOPY;  Service: Pulmonary;;   BRONCHIAL BRUSHINGS  03/11/2023   Procedure: BRONCHIAL BRUSHINGS;  Surgeon: Shelah Lamar RAMAN, MD;   Location: [facility] ENDOSCOPY;  Service: Pulmonary;;   BRONCHIAL NEEDLE ASPIRATION BIOPSY  02/12/2023   Procedure: BRONCHIAL NEEDLE ASPIRATION BIOPSIES;  Surgeon: Shelah Lamar RAMAN, MD;  Location: [facility] ENDOSCOPY;  Service: Pulmonary;;   BRONCHIAL NEEDLE ASPIRATION BIOPSY  03/11/2023   Procedure: BRONCHIAL NEEDLE ASPIRATION BIOPSIES;  Surgeon: Shelah Lamar RAMAN, MD;  Location: [facility] ENDOSCOPY;  Service: Pulmonary;;   BRONCHIAL WASHINGS  02/12/2023   Procedure: BRONCHIAL WASHINGS;  Surgeon: Shelah Lamar RAMAN, MD;  Location: [facility] ENDOSCOPY;  Service: Pulmonary;;   COLONOSCOPY WITH PROPOFOL  N/A 02/03/2023   Procedure: COLONOSCOPY WITH PROPOFOL ;  Surgeon: Rollin Dover, MD;  Location: [facility] ENDOSCOPY;  Service: Gastroenterology;  Laterality: N/A;   ESOPHAGOGASTRODUODENOSCOPY (EGD) WITH PROPOFOL  N/A 02/03/2023   Procedure: ESOPHAGOGASTRODUODENOSCOPY (EGD) WITH PROPOFOL ;  Surgeon: Rollin Dover, MD;  Location: [facility] ENDOSCOPY;  Service: Gastroenterology;  Laterality: N/A;   HEMOSTASIS CLIP PLACEMENT  02/03/2023   Procedure: HEMOSTASIS CLIP PLACEMENT;  Surgeon: Rollin Dover, MD;  Location: [facility] ENDOSCOPY;  Service: Gastroenterology;;  ascending colon polyp   IR IMAGING GUIDED PORT INSERTION  04/30/2023   jp drains     s/p masectomy 06/11/11/ Dr. Gail Favorite, 2 jp drains right chest wall   MASTECTOMY Right    malignant   MASTECTOMY W/ SENTINEL NODE BIOPSY  06/11/2011/Right BReast   Procedure: MASTECTOMY WITH SENTINEL LYMPH NODE BIOPSY;  Surgeon: Favorite CHRISTELLA Gail, MD;  Location: [facility] OR;  Service: General;  Laterality: Right;  Right total mastectomy and sentinel lymph node biopsy using lymphatic mapping and blue dye injection.   POLYPECTOMY  02/03/2023   Procedure: POLYPECTOMY;  Surgeon: Rollin Dover, MD;  Location: [facility] ENDOSCOPY;  Service: Gastroenterology;;  hot snare polypectomy ascending colon   ROBOT ASSISTED LAPAROSCOPIC NEPHRECTOMY Right 04/03/2023   Procedure: XI ROBOTIC ASSISTED RIGHT LAPAROSCOPIC RADICAL NEPHRECTOMY;  Surgeon:  Alvaro Ricardo KATHEE Mickey., MD;  Location: [facility] ORS;  Service: Urology;  Laterality: Right;  180 MINUTES   SUBMUCOSAL LIFTING INJECTION  02/03/2023   Procedure: SUBMUCOSAL LIFTING INJECTION;  Surgeon: Rollin Dover, MD;  Location: [facility] ENDOSCOPY;  Service: Gastroenterology;;  ever lift 16cc ascending colon   TUBAL LIGATION     US  FINE NEEDLE ASPIRATION WO/ W SMEAR  05/22/10   Left Breast- cyst or abscess     FAMILY HISTORY:  Family History  Problem Relation Age of Onset   Breast cancer Sister    Anesthesia problems Neg Hx    Hypotension Neg Hx    Malignant hyperthermia Neg Hx    Pseudochol deficiency Neg Hx     SOCIAL HISTORY:  Social History   Socioeconomic History   Marital status: Single    Spouse name: Not on file   Number of children: 5   Years of education: Not on file   Highest education level: Not on file  Occupational History   Occupation: Retired  Tobacco Use   Smoking status: Never   Smokeless tobacco: Never  Vaping Use   Vaping  status: Never Used  Substance and Sexual Activity   Alcohol use: No   Drug use: No   Sexual activity: Not Currently    Birth control/protection: Post-menopausal  Other Topics Concern   Not on file  Social History Narrative   Not on file   Social Drivers of Health   Tobacco Use: Low Risk (10/07/2023)   Patient History    Smoking Tobacco Use: Never    Smokeless Tobacco Use: Never    Passive Exposure: Not on file  Financial Resource Strain: Low Risk (03/25/2023)   Overall Financial Resource Strain (CARDIA)    Difficulty of Paying Living Expenses: Not hard at all  Food Insecurity: No Food Insecurity (04/17/2024)   Epic    Worried About Radiation Protection Practitioner of Food in the Last Year: Never true    Ran Out of Food in the Last Year: Never true  Transportation Needs: No Transportation Needs (04/17/2024)   Epic    Lack of Transportation (Medical): No    Lack of Transportation (Non-Medical): No  Physical Activity: Sufficiently Active (07/25/2022)    Exercise Vital Sign    Days of Exercise per Week: 7 days    Minutes of Exercise per Session: 150+ min  Stress: No Stress Concern Present (07/25/2022)   [employer] of Occupational Health - Occupational Stress Questionnaire    Feeling of Stress : Not at all  Social Connections: Socially Isolated (07/25/2022)   Social Connection and Isolation Panel    Frequency of Communication with Friends and Family: More than three times a week    Frequency of Social Gatherings with Friends and Family: More than three times a week    Attends Religious Services: Never    Database Administrator or Organizations: No    Attends Banker Meetings: Never    Marital Status: Divorced  Catering Manager Violence: Not At Risk (04/17/2024)   Epic    Fear of Current or Ex-Partner: No    Emotionally Abused: No    Physically Abused: No    Sexually Abused: No  Depression (PHQ2-9): Low Risk (04/17/2024)   Depression (PHQ2-9)    PHQ-2 Score: 0  Alcohol Screen: Low Risk (07/25/2022)   Alcohol Screen    Last Alcohol Screening Score (AUDIT): 0  Housing: Low Risk (04/17/2024)   Epic    Unable to Pay for Housing in the Last Year: No    Number of Times Moved in the Last Year: 0    Homeless in the Last Year: No  Utilities: Not At Risk (04/17/2024)   Epic    Threatened with loss of utilities: No  Health Literacy: Not on file    ALLERGIES: Fosamax  [alendronate  sodium]  MEDICATIONS:  Current Outpatient Medications  Medication Sig Dispense Refill   Calcium  Carb-Cholecalciferol (CALCIUM -VITAMIN D  PO) Take by mouth.     diphenhydrAMINE  (BENADRYL ) 25 mg capsule Take 25 mg by mouth every 6 (six) hours as needed for itching.     Ensure (ENSURE) Take 237 mLs by mouth 2 (two) times daily between meals.     lenvatinib  10 mg daily dose (LENVIMA ) capsule Take 2 capsules (20 mg total) by mouth daily. 60 capsule 0   levothyroxine  (SYNTHROID ) 75 MCG tablet TAKE 1 TABLET BY MOUTH DAILY BEFORE BREAKFAST. 90 tablet  0   lidocaine -prilocaine  (EMLA ) cream Apply 1 Application topically as needed. Apply to port ~ 1 hour prior to access 30 g 0   ondansetron  (ZOFRAN ) 8 MG tablet Take 1 tablet (8 mg total) by  mouth every 8 (eight) hours as needed for nausea or vomiting. 30 tablet 1   prochlorperazine  (COMPAZINE ) 10 MG tablet Take 1 tablet (10 mg total) by mouth every 6 (six) hours as needed for nausea or vomiting. 30 tablet 1   senna-docusate (SENOKOT-S) 8.6-50 MG tablet Take 1 tablet by mouth 2 (two) times daily. While taking strong pain meds to prevent constipation 10 tablet 0   No current facility-administered medications for this encounter.    REVIEW OF SYSTEMS:  On review of systems, the patient reports that she is doing well overall. She denies any chest pain, shortness of breath, cough, fevers, chills, night sweats, unintended weight changes. She denies any bowel or bladder disturbances, and denies abdominal pain, nausea or vomiting. She denies any new musculoskeletal or joint aches or pains.*** A complete review of systems is obtained and is otherwise negative.    PHYSICAL EXAM:  Wt Readings from Last 3 Encounters:  04/17/24 203 lb (92.1 kg)  04/17/24 201 lb (91.2 kg)  03/27/24 201 lb 12.8 oz (91.5 kg)   Temp Readings from Last 3 Encounters:  04/17/24 (!) 97.5 F (36.4 C)  04/17/24 97.8 F (36.6 C) (Temporal)  03/27/24 97.7 F (36.5 C) (Temporal)   BP Readings from Last 3 Encounters:  04/17/24 (!) 146/85  04/17/24 (!) 138/96  04/17/24 (!) 146/91   Pulse Readings from Last 3 Encounters:  04/17/24 91  04/17/24 78  04/17/24 77   Pain Assessment Pain Score: 0-No pain/10  In general this is a well appearing *** woman in no acute distress. She's alert and oriented x4 and appropriate throughout the examination. Cardiopulmonary assessment is negative for acute distress and she exhibits normal effort.     KPS = ***  100 - Normal; no complaints; no evidence of disease. 90   - Able to carry on  normal activity; minor signs or symptoms of disease. 80   - Normal activity with effort; some signs or symptoms of disease. 51   - Cares for self; unable to carry on normal activity or to do active work. 60   - Requires occasional assistance, but is able to care for most of his personal needs. 50   - Requires considerable assistance and frequent medical care. 40   - Disabled; requires special care and assistance. 30   - Severely disabled; hospital admission is indicated although death not imminent. 20   - Very sick; hospital admission necessary; active supportive treatment necessary. 10   - Moribund; fatal processes progressing rapidly. 0     - Dead  Karnofsky DA, Abelmann WH, Craver LS and Burchenal JH [phone]) The use of the nitrogen mustards in the palliative treatment of carcinoma: with particular reference to bronchogenic carcinoma Cancer 1 634-56  LABORATORY DATA:  Lab Results  Component Value Date   WBC 5.8 04/17/2024   HGB 10.4 (L) 04/17/2024   HCT 34.0 (L) 04/17/2024   MCV 84.4 04/17/2024   PLT 286 04/17/2024   Lab Results  Component Value Date   NA 139 04/17/2024   K 4.1 04/17/2024   CL 103 04/17/2024   CO2 26 04/17/2024   Lab Results  Component Value Date   ALT 8 04/17/2024   AST 18 04/17/2024   ALKPHOS 51 04/17/2024   BILITOT 0.3 04/17/2024     RADIOGRAPHY: NM Bone Scan Whole Body Result Date: 04/10/2024 CLINICAL DATA:  Metastatic renal cell carcinoma. Assess treatment response. EXAM: NUCLEAR MEDICINE WHOLE BODY BONE SCAN TECHNIQUE: Whole body anterior and posterior  images were obtained approximately 3 hours after intravenous injection of radiopharmaceutical. RADIOPHARMACEUTICALS:  21.5 mCi Technetium-34m MDP IV COMPARISON:  CT of the chest, abdomen and pelvis 03/13/2024 and 11/29/2023. Whole-body bone scan 05/31/2023. FINDINGS: There is no osseous activity suspicious for residual or progressive osseous metastatic disease. Previously demonstrated uptake laterally in  the right 8th rib has decreased, corresponding with a healed fracture on recent CT. There is no abnormal uptake within the upper thoracic spine, sacrum or iliac bones. Multifocal degenerative uptake is again noted, perhaps slightly increased within the left midfoot and right 1st MTP joint. No suspicious soft tissue uptake. Status post right nephrectomy. Asymmetric chest wall uptake attributed to previous right mastectomy. Asymmetric bladder activity in the right, corresponding with a right-sided bladder diverticulum on CT. IMPRESSION: 1. No residual or recurrent suspicious osseous uptake. 2. Healing right 8th rib fracture with associated improved uptake. 3. Similar multifocal arthropathic uptake. Electronically Signed   By: Elsie Perone M.D.   On: 04/10/2024 11:17      IMPRESSION/PLAN: 1. 83 y.o. woman with ***  Today, we talked to the patient and family about the findings and workup thus far. We discussed the natural history of *** carcinoma and general treatment, highlighting the role of radiotherapy in the management. We discussed the available radiation techniques, and focused on the details and logistics of delivery. We reviewed the anticipated acute and late sequelae associated with radiation in this setting. The patient was encouraged to ask questions that were answered to her satisfaction.    I personally spent *** minutes in this encounter including chart review, reviewing radiological studies, meeting face-to-face with the patient, entering orders and completing documentation.    Sabra MICAEL Rusk, PA-C    Donnice Barge, MD  [facility]  Radiation Oncology Direct Dial: [phone]  Fax: [phone] [email or web address]  Skype  [email or web address]   This document serves as a record of services personally performed by Donnice Barge, MD and Sabra Rusk, PA-C. It was created on their behalf by Izetta Neither, a trained medical scribe. The creation of this record is based on the  scribe's personal observations and the provider's statements to them. This document has been checked and approved by the attending provider.

## 2024-04-21 ENCOUNTER — Other Ambulatory Visit: Payer: Self-pay

## 2024-04-24 DIAGNOSIS — C7802 Secondary malignant neoplasm of left lung: Secondary | ICD-10-CM | POA: Diagnosis not present

## 2024-04-29 ENCOUNTER — Other Ambulatory Visit: Payer: Self-pay

## 2024-04-29 DIAGNOSIS — C649 Malignant neoplasm of unspecified kidney, except renal pelvis: Secondary | ICD-10-CM

## 2024-04-29 MED ORDER — LENVATINIB (10 MG DAILY DOSE) 10 MG PO CPPK
20.0000 mg | ORAL_CAPSULE | Freq: Every day | ORAL | 0 refills | Status: AC
  Filled 2024-05-01 – 2024-06-09 (×4): qty 60, 30d supply, fill #0

## 2024-05-01 ENCOUNTER — Other Ambulatory Visit: Payer: Self-pay

## 2024-05-01 ENCOUNTER — Other Ambulatory Visit (HOSPITAL_COMMUNITY): Payer: Self-pay

## 2024-05-05 ENCOUNTER — Other Ambulatory Visit (HOSPITAL_COMMUNITY): Payer: Self-pay

## 2024-05-05 ENCOUNTER — Other Ambulatory Visit: Payer: Self-pay

## 2024-05-05 ENCOUNTER — Ambulatory Visit
Admission: RE | Admit: 2024-05-05 | Discharge: 2024-05-05 | Disposition: A | Discharge status: Home / Self Care | Source: Ambulatory Visit | Attending: Radiation Oncology | Admitting: Radiation Oncology

## 2024-05-05 DIAGNOSIS — C7802 Secondary malignant neoplasm of left lung: Secondary | ICD-10-CM | POA: Diagnosis not present

## 2024-05-05 LAB — RAD ONC ARIA SESSION SUMMARY
Course Elapsed Days: 0
Plan Fractions Treated to Date: 1
Plan Fractions Treated to Date: 1
Plan Prescribed Dose Per Fraction: 10 Gy
Plan Prescribed Dose Per Fraction: 12 Gy
Plan Total Fractions Prescribed: 5
Plan Total Fractions Prescribed: 5
Plan Total Prescribed Dose: 50 Gy
Plan Total Prescribed Dose: 60 Gy
Reference Point Dosage Given to Date: 10 Gy
Reference Point Dosage Given to Date: 12 Gy
Reference Point Session Dosage Given: 10 Gy
Reference Point Session Dosage Given: 12 Gy
Session Number: 1

## 2024-05-06 ENCOUNTER — Ambulatory Visit

## 2024-05-06 NOTE — Assessment & Plan Note (Deleted)
 Undergoing radiation, 60 Gy in 5 fractions to two lesions in the Right Upper Lobe and one lesion in the Left Lower Lobe

## 2024-05-06 NOTE — Assessment & Plan Note (Deleted)
 Cannot tolerate lenvatinib  at 20 mg daily so will continue at 10 mg daily. started on 05/07/23 Continue with pembrolizumab  Vit D 1000 international units with Calcium  1200 mg daily 09/03/23-09/13/23 Completed radiation of oliogoprogression of left parietal bone Restaging CT in Nov showed stable disease Continue zometa .  Next zometa  will be in March

## 2024-05-06 NOTE — Assessment & Plan Note (Deleted)
 Bone metastases Patient has no dentaI issues and teeth.  Started Zometa  3 mg every 3 months in Feb 2025 Continue zometa . Recommend patient stop mild vitamin D  [phone] IU and calcium  1200 mg daily No jaw pain. Next Zometa  in March

## 2024-05-06 NOTE — Progress Notes (Deleted)
 [facility] OFFICE PROGRESS NOTE  Patient Care Team: Duanne Butler DASEN, MD as PCP - General (Family Medicine)  Assessment & Plan   No orders of the defined types were placed in this encounter.    Diane JAYSON Chihuahua, MD  INTERVAL HISTORY: Patient returns for follow-up.  Oncology History  History of breast cancer  06/11/2011 Surgery   Right breast mastectomy with right axillary sentinel lymph node biopsy 1.3 cm invasive lobular cancer, grade 1, atypical lobular hyperplasia, focal LV I, ER 91%, PR 25%, HER-2 negative, 0/8 lymph nodes T1 cN0 stage IA   07/25/2011 -  Anti-estrogen oral therapy   Letrozole  2.5 mg daily 7 years was a plan   Metastatic renal cell carcinoma to bone (HCC)  02/01/2023 Initial Diagnosis   Renal cell carcinoma (HCC)   02/05/2023 Imaging   Bone scan: No evidence of osseous metastatic disease.    04/03/2023 Pathology Results   A. 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 There are several residual lung nodules identified.   Known bone metastases   No developing new mass lesion or nodal enlargement.   Gallbladder full of stones and is distended, similar to previous.   Pelvic prolapse identified of the anterior posterior compartments.   Metallic foci along the lumen of the cecum are linear and  have appearance of clips. Please correlate for ingested material or intervention.   08/02/2023 Imaging   MRI Brain 1. Negative for brain metastasis. 2. Newly enhancing 9 mm lesion in the high left parietal bone, possible early bony metastasis   11/29/2023 Imaging   CT CAP IMPRESSION: 1. Status post right nephrectomy and adrenalectomy. No suspicious soft tissue or contrast enhancement in the nephrectomy bed. 2. Unchanged pulmonary metastases. 3. Unchanged osseous metastases. 4. No evidence of new metastatic disease in the chest, abdomen, or pelvis. 5. Cholelithiasis.  Lungs/Pleura:  Unchanged nodule of the posterior right pulmonary apex measuring 1.8 x 1.3 cm (series 4, image 27).  Unchanged nodule of the paramedian right upper lobe measuring 1.0 x 0.6 cm (series 4, image 29).  Unchanged nodule of the dependent left lower lobe measuring 0.4 cm (series 4, image 70).  Unchanged subpleural nodule of the dependent left lower lobe measuring 1.2 x 0.6 cm (series 4, image 87). No pleural effusion or pneumothorax.   Unchanged mixed lytic and sclerotic metastasis of T3 (series 6, image 105). Callused fracture deformity of the lateral right eighth rib with interval healing (series 2, image 39). Unchanged small rim sclerotic metastasis of S1 (series 6, image 105) Unchanged faintly lytic metastasis of the left ilium (series 2, image 89).   11/29/2023 Imaging   CT: stable disease MRI brain: no new disease.   NSCLC of left lung (HCC)  03/18/2023 Initial Diagnosis   NSCLC of left lung (HCC)   06/07/2023  Cancer Staging   Staging form: Lung, AJCC 8th Edition - Clinical: cT1, cN1, cM0 - Signed by Tina Diane BROCKS, MD on 06/07/2023 Stage prefix: Initial diagnosis Histologic grade (G): GX Histologic grading system: 4 grade system      PHYSICAL EXAMINATION: ECOG PERFORMANCE STATUS: {CHL ONC ECOG PS:[phone]}  There were no vitals filed for this visit. There were no vitals filed for this  visit.  GENERAL: alert, no distress and comfortable SKIN: skin color normal and no jaundice or bruising or petechiae on exposed skin EYES: normal, sclera clear OROPHARYNX: no exudate  NECK: No palpable mass LYMPH:  no palpable cervical, axillary lymphadenopathy  LUNGS: clear to auscultation and no wheeze or rales with normal breathing effort HEART: regular rate & rhythm  ABDOMEN: abdomen soft, non-tender and nondistended. Musculoskeletal: no edema NEURO: no focal motor/sensory deficits  Relevant data reviewed during this visit included labs.  New labs ordered.

## 2024-05-06 NOTE — Assessment & Plan Note (Deleted)
 Increase levothyroxine  to 88 mcg once daily.  Will change dose if needed Continue to monitor fT4

## 2024-05-08 ENCOUNTER — Ambulatory Visit

## 2024-05-08 ENCOUNTER — Inpatient Hospital Stay

## 2024-05-08 ENCOUNTER — Ambulatory Visit
Admission: RE | Admit: 2024-05-08 | Discharge: 2024-05-08 | Disposition: A | Discharge status: Home / Self Care | Source: Ambulatory Visit | Attending: Radiation Oncology | Admitting: Radiation Oncology

## 2024-05-08 ENCOUNTER — Other Ambulatory Visit: Payer: Self-pay

## 2024-05-08 DIAGNOSIS — C7801 Secondary malignant neoplasm of right lung: Secondary | ICD-10-CM | POA: Diagnosis present

## 2024-05-08 DIAGNOSIS — C649 Malignant neoplasm of unspecified kidney, except renal pelvis: Secondary | ICD-10-CM | POA: Insufficient documentation

## 2024-05-08 DIAGNOSIS — Z9189 Other specified personal risk factors, not elsewhere classified: Secondary | ICD-10-CM

## 2024-05-08 DIAGNOSIS — C7802 Secondary malignant neoplasm of left lung: Secondary | ICD-10-CM | POA: Diagnosis present

## 2024-05-08 DIAGNOSIS — C799 Secondary malignant neoplasm of unspecified site: Secondary | ICD-10-CM

## 2024-05-08 DIAGNOSIS — E039 Hypothyroidism, unspecified: Secondary | ICD-10-CM

## 2024-05-08 LAB — RAD ONC ARIA SESSION SUMMARY
Course Elapsed Days: 3
Plan Fractions Treated to Date: 2
Plan Fractions Treated to Date: 2
Plan Prescribed Dose Per Fraction: 10 Gy
Plan Prescribed Dose Per Fraction: 12 Gy
Plan Total Fractions Prescribed: 5
Plan Total Fractions Prescribed: 5
Plan Total Prescribed Dose: 50 Gy
Plan Total Prescribed Dose: 60 Gy
Reference Point Dosage Given to Date: 20 Gy
Reference Point Dosage Given to Date: 24 Gy
Reference Point Session Dosage Given: 10 Gy
Reference Point Session Dosage Given: 12 Gy
Session Number: 2

## 2024-05-10 ENCOUNTER — Other Ambulatory Visit: Payer: Self-pay

## 2024-05-11 ENCOUNTER — Ambulatory Visit

## 2024-05-11 ENCOUNTER — Other Ambulatory Visit: Payer: Self-pay

## 2024-05-11 ENCOUNTER — Telehealth: Payer: Self-pay | Admitting: Radiation Oncology

## 2024-05-11 ENCOUNTER — Ambulatory Visit
Admission: RE | Admit: 2024-05-11 | Discharge: 2024-05-11 | Disposition: A | Discharge status: Home / Self Care | Source: Ambulatory Visit | Attending: Radiation Oncology | Admitting: Radiation Oncology

## 2024-05-11 DIAGNOSIS — C7802 Secondary malignant neoplasm of left lung: Secondary | ICD-10-CM | POA: Diagnosis not present

## 2024-05-11 LAB — RAD ONC ARIA SESSION SUMMARY
Course Elapsed Days: 6
Plan Fractions Treated to Date: 3
Plan Fractions Treated to Date: 3
Plan Prescribed Dose Per Fraction: 10 Gy
Plan Prescribed Dose Per Fraction: 12 Gy
Plan Total Fractions Prescribed: 5
Plan Total Fractions Prescribed: 5
Plan Total Prescribed Dose: 50 Gy
Plan Total Prescribed Dose: 60 Gy
Reference Point Dosage Given to Date: 30 Gy
Reference Point Dosage Given to Date: 36 Gy
Reference Point Session Dosage Given: 10 Gy
Reference Point Session Dosage Given: 12 Gy
Session Number: 3

## 2024-05-11 NOTE — Telephone Encounter (Signed)
 1/5 patient came in for treatment appt on today, but was cancel, Called Support RTT and L1 machine no answer.  Spoke to Simms C, so they are aware.

## 2024-05-12 ENCOUNTER — Ambulatory Visit

## 2024-05-13 ENCOUNTER — Other Ambulatory Visit: Payer: Self-pay

## 2024-05-13 ENCOUNTER — Ambulatory Visit
Admission: RE | Admit: 2024-05-13 | Discharge: 2024-05-13 | Disposition: A | Discharge status: Home / Self Care | Source: Ambulatory Visit | Attending: Radiation Oncology | Admitting: Radiation Oncology

## 2024-05-13 DIAGNOSIS — C7802 Secondary malignant neoplasm of left lung: Secondary | ICD-10-CM | POA: Diagnosis not present

## 2024-05-13 LAB — RAD ONC ARIA SESSION SUMMARY
Course Elapsed Days: 8
Plan Fractions Treated to Date: 4
Plan Fractions Treated to Date: 4
Plan Prescribed Dose Per Fraction: 10 Gy
Plan Prescribed Dose Per Fraction: 12 Gy
Plan Total Fractions Prescribed: 5
Plan Total Fractions Prescribed: 5
Plan Total Prescribed Dose: 50 Gy
Plan Total Prescribed Dose: 60 Gy
Reference Point Dosage Given to Date: 40 Gy
Reference Point Dosage Given to Date: 48 Gy
Reference Point Session Dosage Given: 10 Gy
Reference Point Session Dosage Given: 12 Gy
Session Number: 4

## 2024-05-14 ENCOUNTER — Ambulatory Visit

## 2024-05-15 ENCOUNTER — Ambulatory Visit
Admission: RE | Admit: 2024-05-15 | Discharge: 2024-05-15 | Disposition: A | Source: Ambulatory Visit | Attending: Radiation Oncology

## 2024-05-15 ENCOUNTER — Other Ambulatory Visit: Payer: Self-pay

## 2024-05-15 DIAGNOSIS — C7802 Secondary malignant neoplasm of left lung: Secondary | ICD-10-CM | POA: Diagnosis not present

## 2024-05-15 LAB — RAD ONC ARIA SESSION SUMMARY
Course Elapsed Days: 10
Plan Fractions Treated to Date: 5
Plan Fractions Treated to Date: 5
Plan Prescribed Dose Per Fraction: 10 Gy
Plan Prescribed Dose Per Fraction: 12 Gy
Plan Total Fractions Prescribed: 5
Plan Total Fractions Prescribed: 5
Plan Total Prescribed Dose: 50 Gy
Plan Total Prescribed Dose: 60 Gy
Reference Point Dosage Given to Date: 50 Gy
Reference Point Dosage Given to Date: 60 Gy
Reference Point Session Dosage Given: 10 Gy
Reference Point Session Dosage Given: 12 Gy
Session Number: 5

## 2024-05-18 NOTE — Radiation Completion Notes (Signed)
 Patient Name: Diane Paul, Diane Paul MRN: [id number] Date of Birth: [birth year] Referring Physician: BUTLER BURR, M.D. Date of Service: 2024-05-18 Radiation Oncologist: Adina Barge, M.D. [facility] [place]                             RADIATION ONCOLOGY END OF TREATMENT NOTE     Diagnosis: C79.51 Secondary malignant neoplasm of bone Staging on 2011-06-28: History of breast cancer T=T1c, N=N0, M=cM0 Staging on 2023-06-07: NSCLC of left lung (HCC) T=cT1, N=cN1, M=cM0 Intent: Palliative     ==========DELIVERED PLANS==========  First Treatment Date: 2024-05-05 Last Treatment Date: 2024-05-15   Plan Name: Lung_RUL_SBRT Site: Lung, Right Technique: SBRT/SRT-3D Mode: Photon Dose Per Fraction: 10 Gy Prescribed Dose (Delivered / Prescribed): 50 Gy / 50 Gy Prescribed Fxs (Delivered / Prescribed): 5 / 5   Plan Name: Lung_LLL_Hyb Site: Lung, Left Technique: SBRT/SRT-3D Mode: Photon Dose Per Fraction: 12 Gy Prescribed Dose (Delivered / Prescribed): 60 Gy / 60 Gy Prescribed Fxs (Delivered / Prescribed): 5 / 5     ==========ON TREATMENT VISIT DATES========== 2024-05-11, 2024-05-11, 2024-05-15, 2024-05-15, 2024-05-15     ==========UPCOMING VISITS========== 06/19/2024 [facility] INFUSION 1HR30MIN (90) [facility] INFUSION  06/19/2024 [facility] EST PT 30 Tina Pauletta BROCKS, MD  06/19/2024 [facility] PORT FLUSH W/LAB [facility] FLUSH  05/29/2024 [facility] INFUSION 1HR30MIN (90) [facility] INFUSION  05/29/2024 [facility] EST PT 30 Tina Pauletta BROCKS, MD  05/29/2024 [facility] PORT FLUSH W/LAB [facility] FLUSH        ==========APPENDIX - ON TREATMENT VISIT NOTES==========   See weekly On Treatment Notes in Epic for details in the Media tab (listed as Progress notes on the On Treatment Visit Dates listed above).

## 2024-05-21 ENCOUNTER — Other Ambulatory Visit: Payer: Self-pay

## 2024-05-25 ENCOUNTER — Other Ambulatory Visit: Payer: Self-pay

## 2024-05-28 NOTE — Assessment & Plan Note (Addendum)
 Completed radiation. 60 Gy in 5 fractions to two lesions in the Right Upper Lobe and one lesion in the Left Lower Lobe

## 2024-05-28 NOTE — Progress Notes (Unsigned)
 [facility] OFFICE PROGRESS NOTE  Patient Care Team: Duanne Butler DASEN, MD as PCP - General (Family Medicine)  83 y.o.w. 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   Diagnosis: stage IV clear cell RCC Treatment: Right nephrectomy. 04/26/23 started pembrolizumab . Delayed started of Lenvatinib  at 10 mg daily due to wound on 05/07/23. 09/03/23-09/13/23 Completed radiation of oliogoprogression of left parietal bone 03/2024 stable pulmonary metastases.  Resolution of bone metastases. 05/05/24-05/15/24 Patient underwent stereotactic body radiotherapy to the 3 persistent lung nodules in the RUL and LLL.   Report normal BP at home.  Clinically feeling well.  Will proceed with treatment.  Planning on restaging in April. Assessment & Plan Metastatic renal cell carcinoma to bone (HCC) Cannot tolerate lenvatinib  at 20 mg daily so will continue at 10 mg daily. started on 05/07/23 Continue with pembrolizumab  Vit D 1000 international units with Calcium  1200 mg daily Restaging CT about 4/9  or 3 months post RT Continue zometa .  Next zometa  will be in March. Will add to 3/27 Oligometastatic cancer [facility]) Completed radiation. 60 Gy in 5 fractions to two lesions in the Right Upper Lobe and one lesion in the Left Lower Lobe  Stage 3a chronic kidney disease (HCC) Continue to increase fluid intake Monitor with renal function every cycle Acquired hypothyroidism Increase levothyroxine  to 88 mcg once daily.  Will change dose if needed Continue to monitor fT4 Normocytic anemia Worsening microcytosis.   Will repeat ferritin and iron  panel   Orders Placed This Encounter  Procedures   Lactate dehydrogenase    Standing Status:   Future    Expected Date:   08/21/2024    Expiration Date:   08/21/2025   Magnesium     Standing Status:   Future    Expected Date:   08/21/2024     Expiration Date:   08/21/2025   Total Protein, Urine dipstick    Standing Status:   Future    Expected Date:   08/21/2024    Expiration Date:   08/21/2025   CBC with Differential (Cancer Center Only)    Standing Status:   Future    Expected Date:   08/21/2024    Expiration Date:   08/21/2025   CMP (Cancer Center only)    Standing Status:   Future    Expected Date:   08/21/2024    Expiration Date:   08/21/2025   T4, free    Standing Status:   Future    Expected Date:   08/21/2024    Expiration Date:   08/21/2025   TSH    Standing Status:   Future    Expected Date:   08/21/2024    Expiration Date:   08/21/2025   Lactate dehydrogenase    Standing Status:   Future    Expected Date:   09/11/2024    Expiration Date:   09/11/2025   Magnesium     Standing Status:   Future    Expected Date:   09/11/2024    Expiration Date:   09/11/2025   Total Protein, Urine dipstick    Standing Status:   Future    Expected Date:   09/11/2024    Expiration Date:   09/11/2025   CBC with Differential (Cancer Center Only)    Standing Status:   Future    Expected Date:   09/11/2024    Expiration  Date:   09/11/2025   CMP (Cancer Center only)    Standing Status:   Future    Expected Date:   09/11/2024    Expiration Date:   09/11/2025   T4, free    Standing Status:   Future    Expected Date:   09/11/2024    Expiration Date:   09/11/2025   TSH    Standing Status:   Future    Expected Date:   09/11/2024    Expiration Date:   09/11/2025   Lactate dehydrogenase    Standing Status:   Future    Expected Date:   10/02/2024    Expiration Date:   10/02/2025   Magnesium     Standing Status:   Future    Expected Date:   10/02/2024    Expiration Date:   10/02/2025   Total Protein, Urine dipstick    Standing Status:   Future    Expected Date:   10/02/2024    Expiration Date:   10/02/2025   CBC with Differential (Cancer Center Only)    Standing Status:   Future    Expected Date:   10/02/2024    Expiration Date:   10/02/2025   CMP (Cancer Center  only)    Standing Status:   Future    Expected Date:   10/02/2024    Expiration Date:   10/02/2025   T4, free    Standing Status:   Future    Expected Date:   10/02/2024    Expiration Date:   10/02/2025   TSH    Standing Status:   Future    Expected Date:   10/02/2024    Expiration Date:   10/02/2025   Lactate dehydrogenase    Standing Status:   Future    Expected Date:   10/23/2024    Expiration Date:   10/23/2025   Magnesium     Standing Status:   Future    Expected Date:   10/23/2024    Expiration Date:   10/23/2025   Total Protein, Urine dipstick    Standing Status:   Future    Expected Date:   10/23/2024    Expiration Date:   10/23/2025   CBC with Differential (Cancer Center Only)    Standing Status:   Future    Expected Date:   10/23/2024    Expiration Date:   10/23/2025   CMP (Cancer Center only)    Standing Status:   Future    Expected Date:   10/23/2024    Expiration Date:   10/23/2025   T4, free    Standing Status:   Future    Expected Date:   10/23/2024    Expiration Date:   10/23/2025   TSH    Standing Status:   Future    Expected Date:   10/23/2024    Expiration Date:   10/23/2025   Lactate dehydrogenase    Standing Status:   Future    Expected Date:   06/19/2024    Expiration Date:   06/19/2025   Ferritin    Standing Status:   Future    Expected Date:   06/19/2024    Expiration Date:   06/19/2025   Magnesium     Standing Status:   Future    Expected Date:   06/19/2024    Expiration Date:   06/19/2025   Total Protein, Urine dipstick    Standing Status:   Future    Expected Date:   06/19/2024  Expiration Date:   06/19/2025   CBC with Differential (Cancer Center Only)    Standing Status:   Future    Expected Date:   06/19/2024    Expiration Date:   06/19/2025   CMP (Cancer Center only)    Standing Status:   Future    Expected Date:   06/19/2024    Expiration Date:   06/19/2025   T4, free    Standing Status:   Future    Expected Date:   06/19/2024    Expiration Date:    06/19/2025   TSH    Standing Status:   Future    Expected Date:   06/19/2024    Expiration Date:   06/19/2025     Pauletta JAYSON Chihuahua, MD  INTERVAL HISTORY: Patient returns for follow-up. No new pain, hip pain or back. A little coughing. Report draining and allergy.   No short of breath, decreased appetite, abdominal pain, diarrhea or constipation, or bloody stool.  No other changes.  Walking and doing well.  Oncology History  History of breast cancer  06/11/2011 Surgery   Right breast mastectomy with right axillary sentinel lymph node biopsy 1.3 cm invasive lobular cancer, grade 1, atypical lobular hyperplasia, focal LV I, ER 91%, PR 25%, HER-2 negative, 0/8 lymph nodes T1 cN0 stage IA   07/25/2011 -  Anti-estrogen oral therapy   Letrozole  2.5 mg daily 7 years was a plan   Metastatic renal cell carcinoma to bone (HCC)  02/01/2023 Initial Diagnosis   Renal cell carcinoma (HCC)   02/05/2023 Imaging   Bone scan: No evidence of osseous metastatic disease.    04/03/2023 Pathology Results   A. 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 There are several residual lung nodules identified.   Known bone metastases   No developing new mass lesion or nodal enlargement.   Gallbladder full of stones and is distended, similar to previous.   Pelvic prolapse  identified of the anterior posterior compartments.   Metallic foci along the lumen of the cecum are linear and have appearance of clips. Please correlate for ingested material or intervention.   08/02/2023 Imaging   MRI Brain 1. Negative for brain metastasis. 2. Newly enhancing 9 mm lesion in the high left parietal bone, possible early bony metastasis   11/29/2023 Imaging   CT CAP IMPRESSION: 1. Status post right nephrectomy and adrenalectomy. No suspicious soft tissue or contrast enhancement in the nephrectomy bed. 2. Unchanged pulmonary metastases. 3. Unchanged osseous metastases. 4. No evidence of new metastatic disease in the chest, abdomen, or pelvis. 5. Cholelithiasis.  Lungs/Pleura:  Unchanged nodule of the posterior right pulmonary apex measuring 1.8 x 1.3 cm (series 4, image 27).  Unchanged nodule of the paramedian right upper lobe measuring 1.0 x 0.6 cm (series 4, image 29).  Unchanged nodule  of the dependent left lower lobe measuring 0.4 cm (series 4, image 70).  Unchanged subpleural nodule of the dependent left lower lobe measuring 1.2 x 0.6 cm (series 4, image 87). No pleural effusion or pneumothorax.   Unchanged mixed lytic and sclerotic metastasis of T3 (series 6, image 105). Callused fracture deformity of the lateral right eighth rib with interval healing (series 2, image 39). Unchanged small rim sclerotic metastasis of S1 (series 6, image 105) Unchanged faintly lytic metastasis of the left ilium (series 2, image 89).   11/29/2023 Imaging   CT: stable disease MRI brain: no new disease.   NSCLC of left lung (HCC)  03/18/2023 Initial Diagnosis   NSCLC of left lung (HCC)   06/07/2023 Cancer Staging   Staging form: Lung, AJCC 8th Edition - Clinical: cT1, cN1, cM0 - Signed by Tina Pauletta BROCKS, MD on 06/07/2023 Stage prefix: Initial diagnosis Histologic grade (G): GX Histologic grading system: 4 grade system      PHYSICAL EXAMINATION: ECOG PERFORMANCE STATUS:  1  Vitals:   05/29/24 0856 05/29/24 0858  BP: (!) 157/88 (!) 154/101  Pulse: 95   Resp: 17   Temp: 97.8 F (36.6 C)   SpO2: 99%    Filed Weights   05/29/24 0856  Weight: 206 lb (93.4 kg)    GENERAL: alert, no distress and comfortable EYES: normal, sclera clear OROPHARYNX: no ulcers LUNGS: clear to auscultation and no wheeze or rales with normal breathing effort HEART: regular rate & rhythm  ABDOMEN: abdomen soft, non-tender and nondistended. Musculoskeletal: no edema   Relevant data reviewed during this visit included labs.  New labs ordered.

## 2024-05-28 NOTE — Assessment & Plan Note (Addendum)
 Increase levothyroxine  to 88 mcg once daily.  Will change dose if needed Continue to monitor fT4

## 2024-05-28 NOTE — Assessment & Plan Note (Addendum)
 Continue to increase fluid intake Monitor with renal function every cycle

## 2024-05-28 NOTE — Assessment & Plan Note (Addendum)
 Cannot tolerate lenvatinib  at 20 mg daily so will continue at 10 mg daily. started on 05/07/23 Continue with pembrolizumab  Vit D 1000 international units with Calcium  1200 mg daily Restaging CT about 4/9  or 3 months post RT Continue zometa .  Next zometa  will be in March. Will add to 3/27

## 2024-05-29 ENCOUNTER — Inpatient Hospital Stay

## 2024-05-29 VITALS — BP 136/63 | HR 86 | Temp 97.6°F | Resp 18

## 2024-05-29 VITALS — BP 154/101 | HR 95 | Temp 97.8°F | Resp 17 | Ht 67.0 in | Wt 206.0 lb

## 2024-05-29 DIAGNOSIS — Z905 Acquired absence of kidney: Secondary | ICD-10-CM | POA: Insufficient documentation

## 2024-05-29 DIAGNOSIS — C649 Malignant neoplasm of unspecified kidney, except renal pelvis: Secondary | ICD-10-CM

## 2024-05-29 DIAGNOSIS — Z79899 Other long term (current) drug therapy: Secondary | ICD-10-CM | POA: Diagnosis not present

## 2024-05-29 DIAGNOSIS — D649 Anemia, unspecified: Secondary | ICD-10-CM | POA: Diagnosis not present

## 2024-05-29 DIAGNOSIS — N1831 Chronic kidney disease, stage 3a: Secondary | ICD-10-CM

## 2024-05-29 DIAGNOSIS — Z5112 Encounter for antineoplastic immunotherapy: Secondary | ICD-10-CM | POA: Insufficient documentation

## 2024-05-29 DIAGNOSIS — Z8673 Personal history of transient ischemic attack (TIA), and cerebral infarction without residual deficits: Secondary | ICD-10-CM | POA: Diagnosis not present

## 2024-05-29 DIAGNOSIS — Z923 Personal history of irradiation: Secondary | ICD-10-CM | POA: Insufficient documentation

## 2024-05-29 DIAGNOSIS — C7801 Secondary malignant neoplasm of right lung: Secondary | ICD-10-CM | POA: Insufficient documentation

## 2024-05-29 DIAGNOSIS — C641 Malignant neoplasm of right kidney, except renal pelvis: Secondary | ICD-10-CM | POA: Insufficient documentation

## 2024-05-29 DIAGNOSIS — Z79890 Hormone replacement therapy (postmenopausal): Secondary | ICD-10-CM | POA: Insufficient documentation

## 2024-05-29 DIAGNOSIS — C7802 Secondary malignant neoplasm of left lung: Secondary | ICD-10-CM | POA: Diagnosis not present

## 2024-05-29 DIAGNOSIS — C799 Secondary malignant neoplasm of unspecified site: Secondary | ICD-10-CM | POA: Diagnosis not present

## 2024-05-29 DIAGNOSIS — C7951 Secondary malignant neoplasm of bone: Secondary | ICD-10-CM

## 2024-05-29 DIAGNOSIS — C3412 Malignant neoplasm of upper lobe, left bronchus or lung: Secondary | ICD-10-CM | POA: Diagnosis present

## 2024-05-29 DIAGNOSIS — Z853 Personal history of malignant neoplasm of breast: Secondary | ICD-10-CM | POA: Diagnosis not present

## 2024-05-29 DIAGNOSIS — Z79620 Long term (current) use of immunosuppressive biologic: Secondary | ICD-10-CM | POA: Insufficient documentation

## 2024-05-29 DIAGNOSIS — E039 Hypothyroidism, unspecified: Secondary | ICD-10-CM

## 2024-05-29 DIAGNOSIS — R059 Cough, unspecified: Secondary | ICD-10-CM | POA: Diagnosis not present

## 2024-05-29 LAB — CBC WITH DIFFERENTIAL (CANCER CENTER ONLY)
Abs Immature Granulocytes: 0.03 K/uL (ref 0.00–0.07)
Basophils Absolute: 0 K/uL (ref 0.0–0.1)
Basophils Relative: 1 %
Eosinophils Absolute: 0.2 K/uL (ref 0.0–0.5)
Eosinophils Relative: 3 %
HCT: 34 % — ABNORMAL LOW (ref 36.0–46.0)
Hemoglobin: 10.2 g/dL — ABNORMAL LOW (ref 12.0–15.0)
Immature Granulocytes: 1 %
Lymphocytes Relative: 14 %
Lymphs Abs: 0.8 K/uL (ref 0.7–4.0)
MCH: 24.7 pg — ABNORMAL LOW (ref 26.0–34.0)
MCHC: 30 g/dL (ref 30.0–36.0)
MCV: 82.3 fL (ref 80.0–100.0)
Monocytes Absolute: 0.9 K/uL (ref 0.1–1.0)
Monocytes Relative: 14 %
Neutro Abs: 4.2 K/uL (ref 1.7–7.7)
Neutrophils Relative %: 67 %
Platelet Count: 228 K/uL (ref 150–400)
RBC: 4.13 MIL/uL (ref 3.87–5.11)
RDW: 16 % — ABNORMAL HIGH (ref 11.5–15.5)
WBC Count: 6.2 K/uL (ref 4.0–10.5)
nRBC: 0 % (ref 0.0–0.2)

## 2024-05-29 LAB — CMP (CANCER CENTER ONLY)
ALT: 12 U/L (ref 0–44)
AST: 17 U/L (ref 15–41)
Albumin: 3.8 g/dL (ref 3.5–5.0)
Alkaline Phosphatase: 51 U/L (ref 38–126)
Anion gap: 12 (ref 5–15)
BUN: 24 mg/dL — ABNORMAL HIGH (ref 8–23)
CO2: 25 mmol/L (ref 22–32)
Calcium: 9 mg/dL (ref 8.9–10.3)
Chloride: 102 mmol/L (ref 98–111)
Creatinine: 1.54 mg/dL — ABNORMAL HIGH (ref 0.44–1.00)
GFR, Estimated: 33 mL/min — ABNORMAL LOW
Glucose, Bld: 95 mg/dL (ref 70–99)
Potassium: 4.2 mmol/L (ref 3.5–5.1)
Sodium: 139 mmol/L (ref 135–145)
Total Bilirubin: <0.2 mg/dL (ref 0.0–1.2)
Total Protein: 7.4 g/dL (ref 6.5–8.1)

## 2024-05-29 LAB — TOTAL PROTEIN, URINE DIPSTICK: Protein, ur: NEGATIVE mg/dL

## 2024-05-29 LAB — TSH: TSH: 11.6 u[IU]/mL — ABNORMAL HIGH (ref 0.350–4.500)

## 2024-05-29 LAB — MAGNESIUM: Magnesium: 2.1 mg/dL (ref 1.7–2.4)

## 2024-05-29 LAB — T4, FREE: Free T4: 1.24 ng/dL (ref 0.80–2.00)

## 2024-05-29 LAB — LACTATE DEHYDROGENASE: LDH: 197 U/L (ref 105–235)

## 2024-05-29 MED ORDER — SODIUM CHLORIDE 0.9 % IV SOLN: INTRAVENOUS | Status: DC

## 2024-05-29 MED ORDER — SODIUM CHLORIDE 0.9 % IV SOLN
200.0000 mg | Freq: Once | INTRAVENOUS | Status: AC
  Administered 2024-05-29: 200 mg via INTRAVENOUS
  Filled 2024-05-29: qty 200

## 2024-05-29 NOTE — Patient Instructions (Signed)
 [facility] - A DEPT OF [facility]   Discharge Instructions: Thank you for choosing [facility] to provide your oncology and hematology care.   If you have a lab appointment with the Cancer Center, please go directly to the Cancer Center and check in at the registration area.   Wear comfortable clothing and clothing appropriate for easy access to any Portacath or PICC line.   We strive to give you quality time with your provider. You may need to reschedule your appointment if you arrive late (15 or more minutes).  Arriving late affects you and other patients whose appointments are after yours.  Also, if you miss three or more appointments without notifying the office, you may be dismissed from the clinic at the provider's discretion.      For prescription refill requests, have your pharmacy contact our office and allow 72 hours for refills to be completed.    Today you received the following chemotherapy and/or immunotherapy agents: Pembrolizumab Rande Lawman)      To help prevent nausea and vomiting after your treatment, we encourage you to take your nausea medication as directed.  BELOW ARE SYMPTOMS THAT SHOULD BE REPORTED IMMEDIATELY: *FEVER GREATER THAN 100.4 F (38 C) OR HIGHER *CHILLS OR SWEATING *NAUSEA AND VOMITING THAT IS NOT CONTROLLED WITH YOUR NAUSEA MEDICATION *UNUSUAL SHORTNESS OF BREATH *UNUSUAL BRUISING OR BLEEDING *URINARY PROBLEMS (pain or burning when urinating, or frequent urination) *BOWEL PROBLEMS (unusual diarrhea, constipation, pain near the anus) TENDERNESS IN MOUTH AND THROAT WITH OR WITHOUT PRESENCE OF ULCERS (sore throat, sores in mouth, or a toothache) UNUSUAL RASH, SWELLING OR PAIN  UNUSUAL VAGINAL DISCHARGE OR ITCHING   Items with * indicate a potential emergency and should be followed up as soon as possible or go to the Emergency Department if any problems should occur.  Please show the CHEMOTHERAPY ALERT CARD  or IMMUNOTHERAPY ALERT CARD at check-in to the Emergency Department and triage nurse.  Should you have questions after your visit or need to cancel or reschedule your appointment, please contact [facility] - A DEPT [facility]  Dept: [phone]  and follow the prompts.  Office hours are 8:00 a.m. to 4:30 p.m. Monday - Friday. Please note that voicemails left after 4:00 p.m. may not be returned until the following business day.  We are closed weekends and major holidays. You have access to a nurse at all times for urgent questions. Please call the main number to the clinic Dept: [phone] and follow the prompts.   For any non-urgent questions, you may also contact your provider using MyChart. We now offer e-Visits for anyone 75 and older to request care online for non-urgent symptoms. For details visit [email or web address].   Also download the MyChart app! Go to the app store, search "MyChart", open the app, select , and log in with your MyChart username and password.

## 2024-05-29 NOTE — Assessment & Plan Note (Addendum)
 Worsening microcytosis.   Will repeat ferritin and iron  panel

## 2024-05-30 ENCOUNTER — Other Ambulatory Visit: Payer: Self-pay

## 2024-06-08 ENCOUNTER — Other Ambulatory Visit: Payer: Self-pay

## 2024-06-08 ENCOUNTER — Other Ambulatory Visit (HOSPITAL_COMMUNITY): Payer: Self-pay

## 2024-06-08 ENCOUNTER — Telehealth: Payer: Self-pay

## 2024-06-08 NOTE — Telephone Encounter (Signed)
 Oral Oncology Patient Advocate Encounter  Was successful in securing patient a $8000 grant from [facility] to provide copayment coverage for LENVIMA .  This will keep the out of pocket expense at $0.     [employer] ID: [id number]   The billing information is as follows and has been shared with [facility].    RxBin: [id number] PCN: [id number] Member ID: [id number] Group ID: [id number] Dates of Eligibility: 03/11/24 through 03/10/25  Fund:  [facility]  Lucie Lamer, CPhT [place]  [facility] Oncology Pharmacy Patient Advocate Specialist II [place] Phone: [phone]  Fax: [phone] [email or web address]     .

## 2024-06-09 ENCOUNTER — Other Ambulatory Visit (HOSPITAL_COMMUNITY): Payer: Self-pay

## 2024-06-09 ENCOUNTER — Other Ambulatory Visit: Payer: Self-pay

## 2024-06-11 ENCOUNTER — Other Ambulatory Visit (HOSPITAL_COMMUNITY): Payer: Self-pay

## 2024-06-19 ENCOUNTER — Inpatient Hospital Stay

## 2024-07-10 ENCOUNTER — Inpatient Hospital Stay
